# Patient Record
Sex: Female | Born: 1960 | Hispanic: No | Smoking: Never smoker
Health system: Southern US, Community
[De-identification: ages and names within clinical notes are randomized; demographics above are authoritative.]

## PROBLEM LIST (undated history)

## (undated) DIAGNOSIS — N912 Amenorrhea, unspecified: Secondary | ICD-10-CM

## (undated) DIAGNOSIS — I350 Nonrheumatic aortic (valve) stenosis: Secondary | ICD-10-CM

## (undated) DIAGNOSIS — E669 Obesity, unspecified: Secondary | ICD-10-CM

## (undated) DIAGNOSIS — A4902 Methicillin resistant Staphylococcus aureus infection, unspecified site: Secondary | ICD-10-CM

## (undated) DIAGNOSIS — F419 Anxiety disorder, unspecified: Secondary | ICD-10-CM

## (undated) DIAGNOSIS — M1990 Unspecified osteoarthritis, unspecified site: Secondary | ICD-10-CM

## (undated) HISTORY — DX: Amenorrhea, unspecified: N91.2

## (undated) HISTORY — DX: Unspecified osteoarthritis, unspecified site: M19.90

## (undated) HISTORY — DX: Methicillin resistant Staphylococcus aureus infection, unspecified site: A49.02

## (undated) HISTORY — PX: OTHER SURGICAL HISTORY: SHX169

## (undated) HISTORY — DX: Nonrheumatic aortic (valve) stenosis: I35.0

---

## 1988-08-21 HISTORY — PX: DILATION AND CURETTAGE OF UTERUS: SHX78

## 1992-08-21 HISTORY — PX: LAPAROSCOPIC CHOLECYSTECTOMY: SUR755

## 1998-12-22 ENCOUNTER — Emergency Department (HOSPITAL_COMMUNITY): Admission: EM | Admit: 1998-12-22 | Discharge: 1998-12-22 | Payer: Self-pay | Admitting: Emergency Medicine

## 1998-12-22 ENCOUNTER — Encounter: Payer: Self-pay | Admitting: Emergency Medicine

## 1999-01-03 ENCOUNTER — Emergency Department (HOSPITAL_COMMUNITY): Admission: EM | Admit: 1999-01-03 | Discharge: 1999-01-03 | Payer: Self-pay | Admitting: Internal Medicine

## 2002-11-11 ENCOUNTER — Encounter: Payer: Self-pay | Admitting: Internal Medicine

## 2002-11-11 ENCOUNTER — Ambulatory Visit (HOSPITAL_COMMUNITY): Admission: RE | Admit: 2002-11-11 | Discharge: 2002-11-11 | Payer: Self-pay | Admitting: Internal Medicine

## 2002-11-27 ENCOUNTER — Encounter: Admission: RE | Admit: 2002-11-27 | Discharge: 2002-11-27 | Payer: Self-pay | Admitting: Internal Medicine

## 2002-11-27 ENCOUNTER — Encounter: Payer: Self-pay | Admitting: Internal Medicine

## 2004-10-17 ENCOUNTER — Ambulatory Visit (HOSPITAL_COMMUNITY): Admission: RE | Admit: 2004-10-17 | Discharge: 2004-10-17 | Payer: Self-pay | Admitting: Internal Medicine

## 2006-03-28 ENCOUNTER — Inpatient Hospital Stay (HOSPITAL_COMMUNITY): Admission: EM | Admit: 2006-03-28 | Discharge: 2006-04-06 | Payer: Self-pay | Admitting: Emergency Medicine

## 2006-04-02 ENCOUNTER — Ambulatory Visit: Payer: Self-pay | Admitting: Infectious Diseases

## 2006-09-25 ENCOUNTER — Emergency Department (HOSPITAL_COMMUNITY): Admission: EM | Admit: 2006-09-25 | Discharge: 2006-09-26 | Payer: Self-pay | Admitting: Emergency Medicine

## 2006-11-15 ENCOUNTER — Encounter: Admission: RE | Admit: 2006-11-15 | Discharge: 2006-12-19 | Payer: Self-pay | Admitting: Orthopaedic Surgery

## 2006-12-11 ENCOUNTER — Encounter: Admission: RE | Admit: 2006-12-11 | Discharge: 2006-12-11 | Payer: Self-pay | Admitting: Orthopaedic Surgery

## 2007-02-08 ENCOUNTER — Emergency Department (HOSPITAL_COMMUNITY): Admission: EM | Admit: 2007-02-08 | Discharge: 2007-02-08 | Payer: Self-pay | Admitting: Emergency Medicine

## 2008-12-23 ENCOUNTER — Ambulatory Visit (HOSPITAL_COMMUNITY): Admission: RE | Admit: 2008-12-23 | Discharge: 2008-12-23 | Payer: Self-pay | Admitting: Internal Medicine

## 2009-03-17 ENCOUNTER — Emergency Department (HOSPITAL_COMMUNITY): Admission: EM | Admit: 2009-03-17 | Discharge: 2009-03-17 | Payer: Self-pay | Admitting: Emergency Medicine

## 2010-05-21 ENCOUNTER — Emergency Department (HOSPITAL_COMMUNITY): Admission: EM | Admit: 2010-05-21 | Discharge: 2010-05-21 | Payer: Self-pay | Admitting: Emergency Medicine

## 2010-11-03 LAB — WOUND CULTURE

## 2011-01-06 NOTE — Op Note (Signed)
Nicole Hines, Nicole Hines              ACCOUNT NO.:  [id number]   MEDICAL RECORD NO.:  [id number]          PATIENT TYPE:  INP   LOCATION:  6738                         FACILITY:  [facility]   PHYSICIAN:  Kathrin Penner, M.D.   DATE OF BIRTH:  04/29/1961   DATE OF PROCEDURE:  04/02/2006  DATE OF DISCHARGE:                                 OPERATIVE REPORT   PREOPERATIVE DIAGNOSIS:  Abdominal wall abscess.   POSTOPERATIVE DIAGNOSIS:  Abdominal wall abscess.   PROCEDURE:  Incision and drainage of abdominal wall abscess with cultures.   SURGEON:  Dr. Bubba Camp.   ASSISTANT:  OR nurse.   ANESTHESIA:  General.   SPECIMENS:  Cultures for aerobic and anaerobic to the lab.   ESTIMATED BLOOD LOSS:  Minimal.   COMPLICATIONS:  None.  The patient to the PACU in excellent condition.   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  She is  morbidly obese and presents with cellulitis of her anterior abdominal wall.  Ultrasounds show a developing abscess and on today's evaluation the patient  is noted to have a fluctuant area over the right paraumbilical region.  She  comes to the operating room now for incision and drainage, after risks and  potential benefits of surgery have been discussed, all questions answered  and consent obtained.   PROCEDURE:  Following the induction of satisfactory general endotracheal  anesthesia, the patient was positioned supinely and the abdomen is prepped  and draped to be included in a sterile operative field.  The area of  fluctuance was incised down upon and carried down first to about a 3 inch  incision, but upon entering the abscess it was noted to be multiple  loculated and extending up toward the costal margin and down toward the  pubis, and extending somewhat over the midline.  Multiple loculations were  broken up and the abscess cavity was drained and cultured.  It was then  thoroughly irrigated with multiple aliquots of normal saline.  All  pockets  were then packed with saline-  soaked Kerlix gauze, after hemostasis had been assured.  A sterile dressing  was placed over the wound.  The anesthetic reversed and the patient removed  from the operating room to the recovery room in stable condition.  She  tolerated the procedure well.      Kathrin Penner, M.D.  Electronically Signed     PB/MEDQ  D:  04/02/2006  T:  04/02/2006  Job:  [id number]   cc:   Kathrin Penner, M.D.

## 2011-01-06 NOTE — H&P (Signed)
NAMEGENENE, KILMAN NO.:  [id number]   MEDICAL RECORD NO.:  [id number]          PATIENT TYPE:  INP   LOCATION:  6738                         FACILITY:  [facility]   PHYSICIAN:  Precious Reel, MD       DATE OF BIRTH:  05/13/1961   DATE OF ADMISSION:  03/28/2006  DATE OF DISCHARGE:                                HISTORY & PHYSICAL   CHIEF COMPLAINT:  Abdominal abscess and cellulitis, nausea and vomiting.   HISTORY OF PRESENT ILLNESS:  This is a 50 year old female with reactive  airway disease who works outdoors as a Development worker, international aid who was in her usual state  of health until Monday.  She noted a nodule knot on the right lower abdomen.  It grew over the last two days with pain, redness, warmth, nausea, and  vomiting.  Patient has felt febrile.  No chest pain or shortness of breath.  She came to the ED for evaluation and treatment.  She was diagnosed with  cellulitis and an abscess.  She was given IV fluids, IV morphine, IV  vancomycin, and Zofran and I was called for inpatient admission.   PAST MEDICAL HISTORY:  1. Reactive airway disease.  2. Cholecystectomy.  3. Tubal ligation.  4. History of bronchitis.  5. History of tinea.  6. Osteoarthritis.  7. Anxiety.   ALLERGIES:  No known drug allergies.   MEDICATIONS:  1. Lexapro 10 daily.  2. Albuterol MDI two puffs b.i.d.  3. Vicodin p.r.n.  4. Singulair 10 daily.   FAMILY HISTORY:  Coronary artery disease, diabetes, hypertension.   SOCIAL HISTORY:  Divorced, two children.  Landscaper.  No tobacco.  No  alcohol.   REVIEW OF SYSTEMS:  She denies any chest pain, shortness of breath.  She  denies any respiratory issues at this current time.  Review of systems is  positive in the HPI.  She thinks she got this from her tool belt which might  be giving her some friction along her abdomen.   PHYSICAL EXAMINATION:  VITAL SIGNS:  Temperature 100.7, blood pressure  113/72, heart rate 118, respiratory rate 22, 97% room  air.  GENERAL:  Alert and oriented x3.  Face is flushed.  Oropharynx:  She is  missing teeth.  NECK:  No JVD.  PULMONARY:  Clear to auscultation bilaterally.  CARDIAC:  Regular.  ABDOMEN:  Soft except right lower quadrant with red warmth, swelling over  large area with one focal area of potential abscess, veins noted protruding  up to her abdominal wall.  EXTREMITIES:  No clubbing.  No cyanosis.  Trace edema.   CT scan shows cellulitis, fat necrosis anterior wall, subcutaneous  infection.  Two to three abscesses noted.  No acute intracranial process  seen.  Urinalysis negative except for small leukocytes, 100 protein, and 40  ketones.  Sodium 135, potassium 3.8, chloride 99, bicarbonate 26, BUN 10,  creatinine 0.9, glucose 122.  LFTs within normal limits.  White count 24.5,  hemoglobin 13.2, platelet count 322.   ASSESSMENT:  This is a 50 year old woman who is being admitted with  abdominal wall  cellulitis and abdominal wall abscess.  No fasciitis  identified at this point.   PLAN:  1. Admit.  2. IV antibiotics.  3. Surgical consult.  4. Follow up laboratories.  5. IV fluids.  6. Home medications.  7. Follow up on cultures.  8. Rule out MRSA.  9. Follow up on blood sugars and laboratories.  10.Follow up on leukocytosis.  11.Follow accordingly.      Precious Reel, MD  Electronically Signed     JMR/MEDQ  D:  03/29/2006  T:  03/29/2006  Job:  [id number]

## 2011-01-06 NOTE — Consult Note (Signed)
Nicole Hines, Nicole Hines              ACCOUNT NO.:  [id number]   MEDICAL RECORD NO.:  [id number]          PATIENT TYPE:  INP   LOCATION:  6738                         FACILITY:  [facility]   PHYSICIAN:  Marland Kitchen T. Hoxworth, M.D.DATE OF BIRTH:  January 23, 1961   DATE OF CONSULTATION:  03/28/2006  DATE OF DISCHARGE:                                   CONSULTATION   CHIEF COMPLAINT:  Pain, redness, swelling abdominal wall.   HISTORY OF PRESENT ILLNESS:  We were asked by Dr. Virgina Jock to evaluate Ms.  Hines.  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  Approximately 5 days ago she noted a small area of redness and tenderness on  the right abdominal wall, but it did not appear severe.  About 2 days ago,  however, she began feeling generally ill, with malaise, aches, fever or  nausea and vomiting.  At that point, she noted enlargement of the area of  the redness.  Her symptoms progressed and she presented for evaluation  today, and was admitted by Dr. Virgina Jock.  She is not aware of any definite  bites or injury to the area.  She is not diabetic.  She has no history of  any similar problems.   PAST MEDICAL HISTORY:  Surgery includes cholecystectomy, tubal ligation.  Medically, she is followed for morbid obesity, asthma and anxiety.  Medications are Lexapro 10 a day, albuterol MDI b.i.d., Singulair daily.   ALLERGIES:  NO KNOWN ALLERGIES.   SOCIAL HISTORY:  Does not smoke cigarettes or drink alcohol.  Works as a  Development worker, international aid.  Divorced.   FAMILY HISTORY:  Noncontributory.   REVIEW OF SYSTEMS:  Positive for malaise, fever.  RESPIRATORY:  No recent  shortness of breath, cough, wheezing.  CARDIAC:  No chest pain,  palpitations.  ABDOMEN/GI:  No generalized abdominal pain.  Positive for  nausea and vomiting as above.   PHYSICAL EXAMINATION:  Temperature is 100.7, blood pressure 113/72, heart  rate 18, respirations 22, room air O2 sats 97%.  GENERAL:  Obese white female in no acute  distress.  SKIN:  Warm and dry.  See abdomen below.  HEENT:  Sclerae nonicteric.  Oropharynx clear.  LUNGS:  Clear without wheezing or increased work of breathing.  CARDIAC:  Regular tachycardia.  No murmurs.  No edema.  ABDOMEN:  On the right mid abdominal wall is an irregular area of marked  cellulitis, measuring approximately 12-15 x 6-8 cm.  In the lateral aspect  of this there is a small raised papule or puncture.  There is marked  tenderness over this area.  The remainder of the abdomen is nontender.  There is no purulent drainage.  No apparent fluctuance.  No blistering or  skin necrosis.  EXTREMITIES:  No joint swelling or deformity.  NEUROLOGIC:  Alert, oriented.  Motor and sensory exams grossly normal.   LABORATORY:  White count elevated at 23.7, hemoglobin 12.  UA 3-6 white  cells.  CMET abnormal for glucose of 122, and albumin of 3.4   I reviewed her CT scan of the abdomen which shows an area  of cellulitis in  the subcutaneous tissue of the right mid abdominal wall.  There are a couple  of more discrete dense areas in this, but no definite abscess seen.   ASSESSMENT/PLAN:  Severe cellulitis of abdominal wall, questioned insect  bite or puncture wound.  I do not see, at this point, anything that requires  immediate drainage or debridement.  It is a significant soft tissue  infection.  I agree with broad-spectrum antibiotics with vancomycin and  Zosyn.  We will follow closely with you and observe for improvement.  May  well come to needing drainage or debridement.      Darene Lamer. Hoxworth, M.D.  Electronically Signed     BTH/MEDQ  D:  03/28/2006  T:  03/28/2006  Job:  [id number]

## 2011-01-06 NOTE — Discharge Summary (Signed)
NAMENICKCOLE, BRALLEY NO.:  [id number]   MEDICAL RECORD NO.:  [id number]          PATIENT TYPE:  INP   LOCATION:  4827                         FACILITY:  [facility]   PHYSICIAN:  Precious Reel, MD       DATE OF BIRTH:  Mar 07, 1961   DATE OF ADMISSION:  03/28/2006  DATE OF DISCHARGE:  04/06/2006                                 DISCHARGE SUMMARY   PRIMARY CARE Jakaylee Sasaki:  Precious Reel, M.D.   INFECTIOUS DISEASE:  Arelia Longest. Quentin Cornwall, M.D.   SURGEON:  1. Darene Lamer. Hoxworth, M.D.  2. Kathrin Penner, M.D.   DISCHARGE DIAGNOSES:  1. Significant and complex methicillin-resistant Staphylococcus aureus,      abdominal wall abscess and cellulitis.  2. __________.  3. Leukocytosis.  4. Hyperglycemia without diabetes.  5. Increased blood pressure due to pain.  6. History of reactive airway disease.  7. History of cholecystectomy.  8. History of tubal ligation.  9. History of bronchitis.  10.History of tinea.  11.Anxiety.   MEDICATION LIST:  1. Lexapro 10 daily.  2. Albuterol M.D.I. two puffs b.i.d.  3. Vicodin.  4. Singulair.  5. __________ 10 daily.  6. Benadryl 25 mg p.o. q.6h. p.r.n. rash.  7. Keep wound clean.  Normal saline wet dressing to wound.  Cover with dry      dressing and an ABD pad.  8. Home Health.  9. Doxycycline 100 mg p.o. b.i.d. for 10 more days.  10.Diflucan 200 mg p.o. daily, #2 for yeasty rash.   HISTORY OF PRESENT ILLNESS:  Briefly, Ms. 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  She  was in her usual state of health until Monday.  She noted a nodule/knot on  her right lower abdomen.  It grew rapidly and significantly in 2 days with  pain, redness, warmth, nausea and vomiting.  There was no chest pain or  shortness of breath.  There was fever.  She came to the ED for evaluation  and treatment.  She was diagnosis with cellulitis and an abscess.  She was  given IV fluids, IV  morphine, IV vancomycin and Zofran.  I was called for  inpatient admission.   HOSPITAL COURSE:  Due to the impressive nature and the extent of the  abdominal wall infection, Dr. Excell Seltzer was consulted.  Her white count was  23.7.  At the initial surgical visit, the surgeons felt there was no surgery  indicated at that point.  Over the course of the several days and very  slowly healing wound, and appropriate IV antibiotic coverage, Ms. Seiden  defervesced, and her white blood cell count came down into the mid-teens.  From there, she failed to progress any further.  CT scan on March 28, 2006  showed findings compatible with inflammatory infectious process involving  the right anterolateral abdominal wall soft tissues, possibly 2 or 3 small  abscesses and cellulitis.  There was a negative CT pelvis.  There was a  repeat CT of the abdomen and pelvis on March 31, 2006 which  showed an  increase in inflammatory changes within the subcutaneous fat in the right  lower interior abdominal wall.  Development of multiple small irregular  fluid collections which may or may not communicate.  This is consistent with  a phlegmon rather than a well-formed abscess.  Because of this, infectious  disease was brought into the process to make sure that antibiotic coverage  was adequate, and surgery made arrangements to take her to the operating  room.  On April 02, 2006, Ms. Kallio was operated on by Dr. Bubba Camp for an  appropriate I&D.  Cultures eventually came back with MRSA.  Dr. Quentin Cornwall  agreed with a switch over to doxycycline 100 mg p.o. b.i.d. for 7-10 more  days, wanted her to improve her household hygiene, and hygiene while she is  at work.  Dr. Bubba Camp recommended appropriate dressing changes, and on April 05, 2006, Ms. Cipriani was ready to be discharged home, but unfortunately she  did develop a rash that was probably more related to vancomycin and probably  not a true allergy.  She was watched  overnight, and by the following  morning, the rash was some better on Benadryl, and it was decided to let her  go home in stable condition.   In the hospital, she had other issues including hyperglycemia, but her A1C  was fine, and when you followed her blood sugars out, they do not stay  elevated.  She is currently not a diabetic, and this will be followed up as  an outpatient.  She also had problems with elevated blood pressure, and this  will again be followed up as an outpatient.  She did have headaches, and she  ended up getting a non-contrast CT scan which was negative to rule out any  focal abscesses or septic emboli in her brain.   AFTERCARE FOLLOWUP INSTRUCTIONS:  1. She is to followup with me in the week of April 16, 2006.  She will      need a CBC at that visit.  2. She will followup with Dr. Bubba Camp in 2-3 weeks.  3. She will have the wound dressed by home health.  4. She will call with any questions or concerns.      Precious Reel, MD  Electronically Signed     JMR/MEDQ  D:  05/17/2006  T:  05/17/2006  Job:  [id number]

## 2011-06-07 LAB — DIFFERENTIAL
Basophils Absolute: 0.1
Basophils Relative: 0
Eosinophils Relative: 1
Monocytes Absolute: 1.3 — ABNORMAL HIGH
Neutro Abs: 14 — ABNORMAL HIGH

## 2011-06-07 LAB — URINALYSIS, ROUTINE W REFLEX MICROSCOPIC
Ketones, ur: NEGATIVE
Nitrite: NEGATIVE
Specific Gravity, Urine: 1.03
Urobilinogen, UA: 1

## 2011-06-07 LAB — CBC
HCT: 39.3
Hemoglobin: 13.4
MCV: 87.2
Platelets: 318
RBC: 4.51
WBC: 17.8 — ABNORMAL HIGH

## 2011-06-07 LAB — COMPREHENSIVE METABOLIC PANEL
Albumin: 3.8
Alkaline Phosphatase: 98
BUN: 13
CO2: 23
Chloride: 101
GFR calc non Af Amer: >60
Potassium: 4.9
Total Bilirubin: 1.7 — ABNORMAL HIGH

## 2011-06-20 ENCOUNTER — Emergency Department (HOSPITAL_COMMUNITY)
Admission: EM | Admit: 2011-06-20 | Discharge: 2011-06-20 | Disposition: A | Discharge status: Home / Self Care | Payer: Self-pay | Attending: Emergency Medicine | Admitting: Emergency Medicine

## 2011-06-20 DIAGNOSIS — Z8614 Personal history of Methicillin resistant Staphylococcus aureus infection: Secondary | ICD-10-CM | POA: Insufficient documentation

## 2011-06-20 DIAGNOSIS — L02219 Cutaneous abscess of trunk, unspecified: Secondary | ICD-10-CM | POA: Insufficient documentation

## 2011-06-20 DIAGNOSIS — M129 Arthropathy, unspecified: Secondary | ICD-10-CM | POA: Insufficient documentation

## 2011-06-20 DIAGNOSIS — Z79899 Other long term (current) drug therapy: Secondary | ICD-10-CM | POA: Insufficient documentation

## 2011-06-22 ENCOUNTER — Emergency Department (HOSPITAL_COMMUNITY)
Admission: EM | Admit: 2011-06-22 | Discharge: 2011-06-22 | Disposition: A | Discharge status: Home / Self Care | Payer: Self-pay | Attending: Emergency Medicine | Admitting: Emergency Medicine

## 2011-06-22 DIAGNOSIS — Z09 Encounter for follow-up examination after completed treatment for conditions other than malignant neoplasm: Secondary | ICD-10-CM | POA: Insufficient documentation

## 2011-06-22 DIAGNOSIS — L02219 Cutaneous abscess of trunk, unspecified: Secondary | ICD-10-CM | POA: Insufficient documentation

## 2011-07-31 ENCOUNTER — Emergency Department (HOSPITAL_COMMUNITY)
Admission: EM | Admit: 2011-07-31 | Discharge: 2011-08-01 | Disposition: A | Discharge status: Home / Self Care | Payer: Self-pay | Attending: Emergency Medicine | Admitting: Emergency Medicine

## 2011-07-31 ENCOUNTER — Encounter: Payer: Self-pay | Admitting: *Deleted

## 2011-07-31 DIAGNOSIS — L02519 Cutaneous abscess of unspecified hand: Secondary | ICD-10-CM | POA: Insufficient documentation

## 2011-07-31 DIAGNOSIS — M7989 Other specified soft tissue disorders: Secondary | ICD-10-CM | POA: Insufficient documentation

## 2011-07-31 DIAGNOSIS — Y93H2 Activity, gardening and landscaping: Secondary | ICD-10-CM | POA: Insufficient documentation

## 2011-07-31 DIAGNOSIS — IMO0002 Reserved for concepts with insufficient information to code with codable children: Secondary | ICD-10-CM | POA: Insufficient documentation

## 2011-07-31 DIAGNOSIS — L03012 Cellulitis of left finger: Secondary | ICD-10-CM

## 2011-07-31 DIAGNOSIS — M79609 Pain in unspecified limb: Secondary | ICD-10-CM | POA: Insufficient documentation

## 2011-07-31 DIAGNOSIS — L03019 Cellulitis of unspecified finger: Secondary | ICD-10-CM | POA: Insufficient documentation

## 2011-07-31 DIAGNOSIS — J45909 Unspecified asthma, uncomplicated: Secondary | ICD-10-CM | POA: Insufficient documentation

## 2011-07-31 HISTORY — DX: Anxiety disorder, unspecified: F41.9

## 2011-07-31 NOTE — ED Notes (Signed)
The pt was stuck in the lt index finger with a rose thorn last Thursday..  Redness and swelling with pain to the lt distal phalanx

## 2011-08-01 ENCOUNTER — Emergency Department (HOSPITAL_COMMUNITY): Payer: Self-pay

## 2011-08-01 MED ORDER — CEPHALEXIN 500 MG PO CAPS: 500.0000 mg | ORAL_CAPSULE | Freq: Four times a day (QID) | ORAL | Status: AC

## 2011-08-01 MED ORDER — HYDROCODONE-ACETAMINOPHEN 5-500 MG PO TABS: 1.0000 | ORAL_TABLET | Freq: Four times a day (QID) | ORAL | Status: AC | PRN

## 2011-08-01 MED ORDER — IBUPROFEN 800 MG PO TABS
800.0000 mg | ORAL_TABLET | Freq: Once | ORAL | Status: AC
  Administered 2011-08-01: 800 mg via ORAL
  Filled 2011-08-01: qty 1

## 2011-08-01 NOTE — ED Notes (Signed)
Pt denies any questions, verbalizes understanding no driving to pain meds and completion of antibiotics.

## 2011-08-01 NOTE — ED Provider Notes (Signed)
History     CSN: [id number] Arrival date & time: 07/31/2011 10:27 PM   First MD Initiated Contact with Patient 08/01/11 0123      Chief Complaint  Patient presents with  . Wound Infection     HPI: Patient reports she stuck her left index finger with a thorn on a rose bush while pruning rose bushes on Thursday. Since that time she has had increasing redness pain and swelling to the fingertip.   Past Medical History  Diagnosis Date  . Asthma   . Anxiety     History reviewed. No pertinent past surgical history.  History reviewed. No pertinent family history.  History  Substance Use Topics  . Smoking status: Never Smoker   . Smokeless tobacco: Not on file  . Alcohol Use: No    OB History    Grav Para Term Preterm Abortions TAB SAB Ect Mult Living                  Review of Systems  Constitutional: Negative.   HENT: Negative.   Eyes: Negative.   Respiratory: Negative.   Cardiovascular: Negative.   Gastrointestinal: Negative.   Genitourinary: Negative.   Musculoskeletal: Negative.   Skin: Negative.   Neurological: Negative.   Hematological: Negative.   Psychiatric/Behavioral: Negative.     Allergies  Review of patient's allergies indicates no known allergies.  Home Medications   Current Outpatient Rx  Name Route Sig Dispense Refill  . ALPRAZOLAM 0.5 MG PO TABS Oral Take 0.5 mg by mouth at bedtime as needed. For anxiety     . CITALOPRAM HYDROBROMIDE 40 MG PO TABS Oral Take 40 mg by mouth daily.      Marland Kitchen HYDROCODONE-ACETAMINOPHEN 5-500 MG PO TABS Oral Take 1 tablet by mouth every 6 (six) hours as needed. For pain       BP 155/78  Pulse 73  Resp 20  SpO2 97%  LMP 07/17/2011  Physical Exam  Constitutional: She is oriented to person, place, and time. She appears well-developed and well-nourished.  HENT:  Head: Normocephalic and atraumatic.  Eyes: Conjunctivae are normal.  Cardiovascular: Normal rate.   Pulmonary/Chest: Effort normal.    Musculoskeletal: Normal range of motion.  Neurological: She is alert and oriented to person, place, and time.  Skin: Skin is warm and dry. No erythema.  Psychiatric: She has a normal mood and affect.    ED Course  Procedures Findings findings and impression discussed with patient. Will plan for discharge home on antibiotic and encourage frequent warm soaks. Patient to return in 48 hours for recheck at an [facility] urgent care. Patient is agreeable with plan to  Labs Reviewed - No data to display No results found.   No diagnosis found.    MDM  Localized cellulitis s/p puncture wound.        Jeryl Columbia, NP 08/01/11 [phone]

## 2011-08-02 NOTE — ED Provider Notes (Signed)
Medical screening examination/treatment/procedure(s) were performed by non-physician practitioner and as supervising physician I was immediately available for consultation/collaboration.   Johnna Acosta, MD 08/02/11 [phone]

## 2012-03-04 ENCOUNTER — Emergency Department (HOSPITAL_COMMUNITY)
Admission: EM | Admit: 2012-03-04 | Discharge: 2012-03-05 | Disposition: A | Discharge status: Home / Self Care | Payer: Self-pay | Attending: Emergency Medicine | Admitting: Emergency Medicine

## 2012-03-04 ENCOUNTER — Encounter (HOSPITAL_COMMUNITY): Payer: Self-pay | Admitting: *Deleted

## 2012-03-04 DIAGNOSIS — R209 Unspecified disturbances of skin sensation: Secondary | ICD-10-CM | POA: Insufficient documentation

## 2012-03-04 DIAGNOSIS — M25569 Pain in unspecified knee: Secondary | ICD-10-CM | POA: Insufficient documentation

## 2012-03-04 DIAGNOSIS — G8929 Other chronic pain: Secondary | ICD-10-CM | POA: Insufficient documentation

## 2012-03-04 DIAGNOSIS — J45909 Unspecified asthma, uncomplicated: Secondary | ICD-10-CM | POA: Insufficient documentation

## 2012-03-04 DIAGNOSIS — M549 Dorsalgia, unspecified: Secondary | ICD-10-CM

## 2012-03-04 DIAGNOSIS — F411 Generalized anxiety disorder: Secondary | ICD-10-CM | POA: Insufficient documentation

## 2012-03-04 DIAGNOSIS — M545 Low back pain, unspecified: Secondary | ICD-10-CM | POA: Insufficient documentation

## 2012-03-04 DIAGNOSIS — Z79899 Other long term (current) drug therapy: Secondary | ICD-10-CM | POA: Insufficient documentation

## 2012-03-04 DIAGNOSIS — M25579 Pain in unspecified ankle and joints of unspecified foot: Secondary | ICD-10-CM | POA: Insufficient documentation

## 2012-03-04 NOTE — ED Notes (Signed)
Patient with bilateral leg pain.  Patient states that she has arthritis to her right knee and lately her knees down have been hurting.  She feels tingling in her legs.

## 2012-03-05 ENCOUNTER — Emergency Department (HOSPITAL_COMMUNITY): Payer: Self-pay

## 2012-03-05 MED ORDER — OXYCODONE-ACETAMINOPHEN 5-325 MG PO TABS
1.0000 | ORAL_TABLET | Freq: Once | ORAL | Status: AC
  Administered 2012-03-05: 1 via ORAL
  Filled 2012-03-05: qty 1

## 2012-03-05 MED ORDER — PERCOCET 5-325 MG PO TABS: 1.0000 | ORAL_TABLET | ORAL | Status: AC | PRN

## 2012-03-05 NOTE — ED Provider Notes (Signed)
History     CSN: [id number]  Arrival date & time 03/04/12  2209   First MD Initiated Contact with Patient 03/05/12 0050      Chief Complaint  Patient presents with  . Leg Pain    (Consider location/radiation/quality/duration/timing/severity/associated sxs/prior treatment) HPI Comments: Patient is a 51 year-old female with a history of chronic back pain and chronic knee pain who presents with worsening pain in her bilateral knees radiating to her ankles and lower back pain for the past 2 weeks. She describes the pain as throbbing and aching. She sometimes has a shooting pain down the lateral aspects of her lower legs that extends from her knees to her ankles. The pain is constant and an 8 out of 10 on the pain scale. The pain is worsened by walking and weightbearing.  Pt walks with a cane for support.  She notes numbness in the tips of all of her toes and and the plantar surfaces of her MTPs bilaterally. She has had two episodes in which she has fallen when her right knee "gave out." She denies loss of consciousness and other fall related injuries.  Denies fevers, bowel or bladder incontinence or retention, weakness or numbness of the extremities.  Pt is seen by Dr Virgina Jock (PCP).  Has seen Dr Jean Rosenthal (ortho) in the past for her back and knees but has not followed up recently due to lack of insurance.    Patient is a 51 y.o. female presenting with leg pain. The history is provided by the patient.  Leg Pain  Associated symptoms include numbness.    Past Medical History  Diagnosis Date  . Asthma   . Anxiety     History reviewed. No pertinent past surgical history.  History reviewed. No pertinent family history.  History  Substance Use Topics  . Smoking status: Never Smoker   . Smokeless tobacco: Not on file  . Alcohol Use: No    OB History    Grav Para Term Preterm Abortions TAB SAB Ect Mult Living                  Review of Systems  Constitutional: Negative for  fever and chills.  Musculoskeletal: Positive for back pain and gait problem.  Neurological: Positive for numbness. Negative for syncope and weakness.    Allergies  Review of patient's allergies indicates no known allergies.  Home Medications   Current Outpatient Rx  Name Route Sig Dispense Refill  . ALPRAZOLAM 0.5 MG PO TABS Oral Take 0.5-1 mg by mouth at bedtime as needed. For anxiety    . CALCIUM 600+D3 PO Oral Take 1 tablet by mouth 2 (two) times daily.    Marland Kitchen CITALOPRAM HYDROBROMIDE 40 MG PO TABS Oral Take 40 mg by mouth daily.      . OMEGA-3 FATTY ACIDS 1000 MG PO CAPS Oral Take 1 g by mouth 2 (two) times daily.    Marland Kitchen GLUCOSAMINE HCL-MSM PO Oral Take 1 tablet by mouth 2 (two) times daily.    Marland Kitchen HYDROCODONE-ACETAMINOPHEN 5-325 MG PO TABS Oral Take 1 tablet by mouth every 6 (six) hours as needed. For pain    . ADULT MULTIVITAMIN W/MINERALS CH Oral Take 1 tablet by mouth daily.    Marland Kitchen VITAMIN B-12 1000 MCG PO TABS Oral Take 1,000 mcg by mouth daily.      BP 135/75  Pulse 74  Temp 97.7 F (36.5 C) (Oral)  Resp 16  SpO2 98%  Physical Exam  Nursing note  and vitals reviewed. Constitutional: She is oriented to person, place, and time. She appears well-developed and well-nourished. No distress.        Morbidly obese  HENT:  Head: Normocephalic and atraumatic.  Neck: Neck supple.  Pulmonary/Chest: Effort normal.  Musculoskeletal:       Right hip: Normal.       Left hip: Normal.       Right knee: She exhibits decreased range of motion. She exhibits no swelling, no effusion and no deformity.       Left knee: Normal.       Right ankle: Normal.       Left ankle: Normal.       Cervical back: Normal.       Thoracic back: Normal.       Lumbar back: She exhibits tenderness and bony tenderness.       Feet:       Pt with decreased ROM of right knee (chronic, secondary to pain), diffuse tenderness.  No erythema, edema, warmth.    Lower extremities: strength 5/5,  distal pulses  intact.  Straight leg raise is negative.    Neurological: She is alert and oriented to person, place, and time.  Skin: She is not diaphoretic.    ED Course  Procedures (including critical care time)  Labs Reviewed - No data to display Dg Lumbar Spine Complete  03/05/2012  *RADIOLOGY REPORT*  Clinical Data: Back pain after fall.  LUMBAR SPINE - COMPLETE 4+ VIEW  Comparison: MRI lumbar spine 12/11/2006.  Findings: Five lumbar type vertebra with atrophic 12th ribs. Normal alignment of the lumbar vertebra and facet joints.  Mild endplate hypertrophic changes.  Intervertebral disc space heights are mostly preserved.  No vertebral compression deformities.  No focal bone lesion or bone destruction.  Bone cortex and trabecular architecture appear intact.  Degenerative changes in the facet joints.  Surgical clips in the abdomen.  IMPRESSION: Degenerative changes in the lumbar spine.  No displaced fractures identified.  Original Report Authenticated By: Neale Burly, M.D.   Dg Knee Complete 4 Views Left  03/05/2012  *RADIOLOGY REPORT*  Clinical Data: Pain after fall.  LEFT KNEE - COMPLETE 4+ VIEW  Comparison: None.  Findings: Degenerative changes in the left knee with medial compartment narrowing and tricompartmental hypertrophic changes. Small left knee effusion.  No evidence of acute fracture or subluxation.  No focal bone lesion or bone destruction.  Bone cortex and trabecular architecture appear intact.  Soft tissue calcifications consistent with phleboliths.  IMPRESSION: Degenerative changes in the knee.  No acute bony abnormalities.  Original Report Authenticated By: Neale Burly, M.D.   Dg Knee Complete 4 Views Right  03/05/2012  *RADIOLOGY REPORT*  Clinical Data: Pain after fall.  RIGHT KNEE - COMPLETE 4+ VIEW  Comparison: None.  Findings: Degenerative changes in the right knee with tricompartmental narrowing and hypertrophic changes.  No significant effusion.  No evidence of acute  fracture or subluxation.  No focal bone lesion or bone destruction.  Bone cortex and trabecular architecture appear intact.  No abnormal periosteal reaction.  IMPRESSION: Degenerative changes in the right knee.  No acute bony abnormalities identified.  Original Report Authenticated By: Neale Burly, M.D.     1. Chronic back pain   2. Chronic knee pain       MDM  Pt is morbidly obese with chronic back and bilateral knee pain, worsened over past few weeks.  I do not believe this is radicular pain given history  and exam.  No significant neurological deficits on exam.  Gait is abnormal as patient appears to be in pain and walks with limp and cane.  Appears to be chronic worsening of knee and back pain, separately.  Pt given percocet in ED which she stated helped.   Pt d/c home with percocet, PCP and ortho follow up.  Return precautions given.  Patient verbalizes understanding and agrees with plan.         [place], [place] 03/05/12 [phone]

## 2012-03-07 NOTE — ED Provider Notes (Signed)
Medical screening examination/treatment/procedure(s) were performed by non-physician practitioner and as supervising physician I was immediately available for consultation/collaboration.   Mervin Kung, MD 03/07/12 2158

## 2013-11-20 ENCOUNTER — Emergency Department (HOSPITAL_COMMUNITY)
Admission: EM | Admit: 2013-11-20 | Discharge: 2013-11-20 | Disposition: A | Discharge status: Home / Self Care | Payer: BC Managed Care – PPO | Attending: Emergency Medicine | Admitting: Emergency Medicine

## 2013-11-20 ENCOUNTER — Encounter (HOSPITAL_COMMUNITY): Payer: Self-pay | Admitting: Emergency Medicine

## 2013-11-20 DIAGNOSIS — K529 Noninfective gastroenteritis and colitis, unspecified: Secondary | ICD-10-CM

## 2013-11-20 DIAGNOSIS — Z79899 Other long term (current) drug therapy: Secondary | ICD-10-CM | POA: Insufficient documentation

## 2013-11-20 DIAGNOSIS — K5289 Other specified noninfective gastroenteritis and colitis: Secondary | ICD-10-CM | POA: Insufficient documentation

## 2013-11-20 DIAGNOSIS — J45901 Unspecified asthma with (acute) exacerbation: Secondary | ICD-10-CM | POA: Insufficient documentation

## 2013-11-20 DIAGNOSIS — F411 Generalized anxiety disorder: Secondary | ICD-10-CM | POA: Insufficient documentation

## 2013-11-20 LAB — URINALYSIS, ROUTINE W REFLEX MICROSCOPIC
Glucose, UA: NEGATIVE mg/dL
Hgb urine dipstick: NEGATIVE
Ketones, ur: NEGATIVE mg/dL
LEUKOCYTES UA: NEGATIVE
NITRITE: NEGATIVE
PH: 5.5 (ref 5.0–8.0)
Protein, ur: 30 mg/dL — AB
Specific Gravity, Urine: 1.029 (ref 1.005–1.030)
Urobilinogen, UA: 0.2 mg/dL (ref 0.0–1.0)

## 2013-11-20 LAB — CBC WITH DIFFERENTIAL/PLATELET
BASOS ABS: 0 10*3/uL (ref 0.0–0.1)
Basophils Relative: 0 % (ref 0–1)
Eosinophils Absolute: 0.2 10*3/uL (ref 0.0–0.7)
Eosinophils Relative: 2 % (ref 0–5)
HEMATOCRIT: 38.5 % (ref 36.0–46.0)
Hemoglobin: 12.8 g/dL (ref 12.0–15.0)
LYMPHS PCT: 34 % (ref 12–46)
Lymphs Abs: 2.8 10*3/uL (ref 0.7–4.0)
MCH: 30.6 pg (ref 26.0–34.0)
MCHC: 33.2 g/dL (ref 30.0–36.0)
MCV: 92.1 fL (ref 78.0–100.0)
MONO ABS: 0.5 10*3/uL (ref 0.1–1.0)
Monocytes Relative: 6 % (ref 3–12)
Neutro Abs: 4.7 10*3/uL (ref 1.7–7.7)
Neutrophils Relative %: 58 % (ref 43–77)
PLATELETS: 282 10*3/uL (ref 150–400)
RBC: 4.18 MIL/uL (ref 3.87–5.11)
RDW: 13 % (ref 11.5–15.5)
WBC: 8.2 10*3/uL (ref 4.0–10.5)

## 2013-11-20 LAB — COMPREHENSIVE METABOLIC PANEL
ALBUMIN: 3.8 g/dL (ref 3.5–5.2)
ALT: 26 U/L (ref 0–35)
AST: 24 U/L (ref 0–37)
Alkaline Phosphatase: 88 U/L (ref 39–117)
BUN: 13 mg/dL (ref 6–23)
CALCIUM: 9.9 mg/dL (ref 8.4–10.5)
CO2: 29 meq/L (ref 19–32)
CREATININE: 0.63 mg/dL (ref 0.50–1.10)
Chloride: 98 mEq/L (ref 96–112)
GFR calc Af Amer: >90 mL/min (ref >90)
GFR calc non Af Amer: >90 mL/min (ref >90)
Glucose, Bld: 110 mg/dL — ABNORMAL HIGH (ref 70–99)
Potassium: 3.6 mEq/L — ABNORMAL LOW (ref 3.7–5.3)
SODIUM: 138 meq/L (ref 137–147)
TOTAL PROTEIN: 7.7 g/dL (ref 6.0–8.3)
Total Bilirubin: 0.5 mg/dL (ref 0.3–1.2)

## 2013-11-20 LAB — URINE MICROSCOPIC-ADD ON

## 2013-11-20 LAB — LIPASE, BLOOD: LIPASE: 13 U/L (ref 11–59)

## 2013-11-20 MED ORDER — ONDANSETRON HCL 4 MG PO TABS: 4.0000 mg | ORAL_TABLET | Freq: Four times a day (QID) | ORAL | Status: DC

## 2013-11-20 MED ORDER — SODIUM CHLORIDE 0.9 % IV SOLN
1000.0000 mL | Freq: Once | INTRAVENOUS | Status: AC
  Administered 2013-11-20: 1000 mL via INTRAVENOUS

## 2013-11-20 MED ORDER — SODIUM CHLORIDE 0.9 % IV SOLN: 1000.0000 mL | INTRAVENOUS | Status: DC

## 2013-11-20 MED ORDER — ONDANSETRON HCL 4 MG/2ML IJ SOLN
4.0000 mg | Freq: Once | INTRAMUSCULAR | Status: AC
  Administered 2013-11-20: 4 mg via INTRAVENOUS
  Filled 2013-11-20: qty 2

## 2013-11-20 NOTE — ED Provider Notes (Signed)
CSN: [id number]     Arrival date & time 11/20/13  1417 History   First MD Initiated Contact with Patient 11/20/13 1550     Chief Complaint  Patient presents with  . Nausea  . Emesis  . Diarrhea   HPI Pt started to feel sick on Sunday.  She has been exposed to family members with PNA and the flu so she really has not been taking care of herself.  .  She has been vomiting, once today and twice yesterday.  She does not have any diarrhea any more but had episodes yesterday.  She has not been able to eat or drink much and feels dehydrated.  General malaise.  No abdominal pain.  No chest pain.  No syncope.  She tried to call her doctor but was told to come to the ED  Past Medical History  Diagnosis Date  . Asthma   . Anxiety    History reviewed. No pertinent past surgical history. No family history on file. History  Substance Use Topics  . Smoking status: Never Smoker   . Smokeless tobacco: Not on file  . Alcohol Use: No   OB History   Grav Para Term Preterm Abortions TAB SAB Ect Mult Living                 Review of Systems  Constitutional: Positive for fatigue. Negative for fever.  Respiratory: Positive for cough and wheezing.   Cardiovascular: Negative for chest pain.  Gastrointestinal: Negative for abdominal pain.  Genitourinary: Negative for dysuria.  Skin: Negative for rash.  All other systems reviewed and are negative.      Allergies  Review of patient's allergies indicates no known allergies.  Home Medications   Current Outpatient Rx  Name  Route  Sig  Dispense  Refill  . ALPRAZolam (XANAX) 0.5 MG tablet   Oral   Take 0.5-1 mg by mouth at bedtime as needed. For anxiety         . Calcium Carbonate-Vitamin D (CALCIUM 600+D3 PO)   Oral   Take 1 tablet by mouth 2 (two) times daily.         . citalopram (CELEXA) 40 MG tablet   Oral   Take 40 mg by mouth daily.           . fish oil-omega-3 fatty acids 1000 MG capsule   Oral   Take 1 g by mouth 2 (two)  times daily.         Marland Kitchen GLUCOSAMINE HCL-MSM PO   Oral   Take 1 tablet by mouth 2 (two) times daily.         Marland Kitchen HYDROcodone-acetaminophen (NORCO/VICODIN) 5-325 MG per tablet   Oral   Take 1 tablet by mouth every 6 (six) hours as needed. For pain         . Multiple Vitamin (MULTIVITAMIN WITH MINERALS) TABS   Oral   Take 1 tablet by mouth daily.         . vitamin B-12 (CYANOCOBALAMIN) 1000 MCG tablet   Oral   Take 1,000 mcg by mouth daily.          BP 130/79  Pulse 70  Temp(Src) 98.7 F (37.1 C) (Oral)  Resp 20  SpO2 97% Physical Exam  Nursing note and vitals reviewed. Constitutional: She appears well-developed and well-nourished. No distress.  HENT:  Head: Normocephalic and atraumatic.  Right Ear: External ear normal.  Left Ear: External ear normal.  Eyes: Conjunctivae are normal. Right  eye exhibits no discharge. Left eye exhibits no discharge. No scleral icterus.  Neck: Neck supple. No tracheal deviation present.  Cardiovascular: Normal rate, regular rhythm and intact distal pulses.   Pulmonary/Chest: Effort normal and breath sounds normal. No stridor. No respiratory distress. She has no wheezes. She has no rales.  Abdominal: Soft. Bowel sounds are normal. She exhibits no distension. There is no tenderness. There is no rebound and no guarding.  Musculoskeletal: She exhibits no edema and no tenderness.  Neurological: She is alert. She has normal strength. No cranial nerve deficit (no facial droop, extraocular movements intact, no slurred speech) or sensory deficit. She exhibits normal muscle tone. She displays no seizure activity. Coordination normal.  Skin: Skin is warm and dry. No rash noted.  Psychiatric: She has a normal mood and affect.    ED Course  Procedures (including critical care time) Labs Review Labs Reviewed  COMPREHENSIVE METABOLIC PANEL - Abnormal; Notable for the following:    Potassium 3.6 (*)    Glucose, Bld 110 (*)    All other components  within normal limits  URINALYSIS, ROUTINE W REFLEX MICROSCOPIC - Abnormal; Notable for the following:    Color, Urine AMBER (*)    APPearance CLOUDY (*)    Bilirubin Urine SMALL (*)    Protein, ur 30 (*)    All other components within normal limits  URINE MICROSCOPIC-ADD ON - Abnormal; Notable for the following:    Squamous Epithelial / LPF FEW (*)    All other components within normal limits  LIPASE, BLOOD  CBC WITH DIFFERENTIAL   Medications  0.9 %  sodium chloride infusion (1,000 mLs Intravenous New Bag/Given 11/20/13 1612)    Followed by  0.9 %  sodium chloride infusion (not administered)  ondansetron (ZOFRAN) injection 4 mg (4 mg Intravenous Given 11/20/13 1612)     MDM   Final diagnoses:  Gastroenteritis    Likely viral illness.  Improved with IV hydration.  Will dc home with zofran rx.  At this time there does not appear to be any evidence of an acute emergency medical condition and the patient appears stable for discharge with appropriate outpatient follow up.     Kathalene Frames, MD 11/20/13 [phone]

## 2013-11-20 NOTE — ED Notes (Signed)
Pt c/o n/v/d since Sunday. States she has been around sick people recently that has had pneumonia and flu. Denies any pain

## 2013-11-20 NOTE — Discharge Instructions (Signed)
Viral Gastroenteritis Viral gastroenteritis is also known as stomach flu. This condition affects the stomach and intestinal tract. It can cause sudden diarrhea and vomiting. The illness typically lasts 3 to 8 days. Most people develop an immune response that eventually gets rid of the virus. While this natural response develops, the virus can make you quite ill. CAUSES  Many different viruses can cause gastroenteritis, such as rotavirus or noroviruses. You can catch one of these viruses by consuming contaminated food or water. You may also catch a virus by sharing utensils or other personal items with an infected person or by touching a contaminated surface. SYMPTOMS  The most common symptoms are diarrhea and vomiting. These problems can cause a severe loss of body fluids (dehydration) and a body salt (electrolyte) imbalance. Other symptoms may include:  Fever.  Headache.  Fatigue.  Abdominal pain. DIAGNOSIS  Your caregiver can usually diagnose viral gastroenteritis based on your symptoms and a physical exam. A stool sample may also be taken to test for the presence of viruses or other infections. TREATMENT  This illness typically goes away on its own. Treatments are aimed at rehydration. The most serious cases of viral gastroenteritis involve vomiting so severely that you are not able to keep fluids down. In these cases, fluids must be given through an intravenous line (IV). HOME CARE INSTRUCTIONS   Drink enough fluids to keep your urine clear or pale yellow. Drink small amounts of fluids frequently and increase the amounts as tolerated.  Ask your caregiver for specific rehydration instructions.  Avoid:  Foods high in sugar.  Alcohol.  Carbonated drinks.  Tobacco.  Juice.  Caffeine drinks.  Extremely hot or cold fluids.  Fatty, greasy foods.  Too much intake of anything at one time.  Dairy products until 24 to 48 hours after diarrhea stops.  You may consume probiotics.  Probiotics are active cultures of beneficial bacteria. They may lessen the amount and number of diarrheal stools in adults. Probiotics can be found in yogurt with active cultures and in supplements.  Wash your hands well to avoid spreading the virus.  Only take over-the-counter or prescription medicines for pain, discomfort, or fever as directed by your caregiver. Do not give aspirin to children. Antidiarrheal medicines are not recommended.  Ask your caregiver if you should continue to take your regular prescribed and over-the-counter medicines.  Keep all follow-up appointments as directed by your caregiver. SEEK IMMEDIATE MEDICAL CARE IF:   You are unable to keep fluids down.  You do not urinate at least once every 6 to 8 hours.  You develop shortness of breath.  You notice blood in your stool or vomit. This may look like coffee grounds.  You have abdominal pain that increases or is concentrated in one small area (localized).  You have persistent vomiting or diarrhea.  You have a fever.  The patient is a child younger than 3 months, and he or she has a fever.  The patient is a child older than 3 months, and he or she has a fever and persistent symptoms.  The patient is a child older than 3 months, and he or she has a fever and symptoms suddenly get worse.  The patient is a baby, and he or she has no tears when crying. MAKE SURE YOU:   Understand these instructions.  Will watch your condition.  Will get help right away if you are not doing well or get worse. Document Released: 08/07/2005 Document Revised: 10/30/2011 Document Reviewed: 05/24/2011   ExitCare Patient Information 2014 [employer].  

## 2013-12-09 ENCOUNTER — Other Ambulatory Visit: Payer: Self-pay | Admitting: Internal Medicine

## 2013-12-09 ENCOUNTER — Encounter: Payer: Self-pay | Admitting: Gastroenterology

## 2013-12-09 DIAGNOSIS — Z1231 Encounter for screening mammogram for malignant neoplasm of breast: Secondary | ICD-10-CM

## 2013-12-12 ENCOUNTER — Encounter: Payer: Self-pay | Admitting: Obstetrics & Gynecology

## 2013-12-12 ENCOUNTER — Telehealth: Payer: Self-pay | Admitting: Gynecology

## 2013-12-12 NOTE — Telephone Encounter (Signed)
LMTCB to schedule new patient doctor appointment

## 2013-12-15 ENCOUNTER — Ambulatory Visit: Payer: BC Managed Care – PPO

## 2013-12-15 NOTE — Telephone Encounter (Signed)
lmtcb/[place]

## 2013-12-18 NOTE — Telephone Encounter (Signed)
lmtcb/[place]

## 2013-12-29 ENCOUNTER — Ambulatory Visit (AMBULATORY_SURGERY_CENTER): Payer: Self-pay | Admitting: *Deleted

## 2013-12-29 ENCOUNTER — Telehealth: Payer: Self-pay | Admitting: *Deleted

## 2013-12-29 ENCOUNTER — Other Ambulatory Visit: Payer: Self-pay

## 2013-12-29 VITALS — Ht 64.5 in | Wt 301.8 lb

## 2013-12-29 DIAGNOSIS — Z1211 Encounter for screening for malignant neoplasm of colon: Secondary | ICD-10-CM

## 2013-12-29 MED ORDER — MOVIPREP 100 G PO SOLR: ORAL | Status: DC

## 2013-12-29 NOTE — Telephone Encounter (Signed)
Pt has been scheduled and instructed for colon 01/08/14

## 2013-12-29 NOTE — Telephone Encounter (Signed)
Dr Ardis Hughs; pt came for [facility] today for [place]  Screening colonoscopy 6/1.  Pt has BMI of 51.  Medical history includes asthma and arthritis.  Is she okay for direct hospital colonoscopy or does she need OV with you first?  Thanks, [name]

## 2013-12-29 NOTE — Telephone Encounter (Signed)
Ok for direct to hospital for colon cancer screening (MAC day, check with Patty for my next available time on a THursday, not hosp week).  Thanks

## 2013-12-29 NOTE — Progress Notes (Signed)
No allergies to eggs or soy. No problems with anesthesia.  Pt was not given Emmi instructions for colonoscopy; no computer access  No oxygen use  No diet drug use

## 2013-12-29 NOTE — Telephone Encounter (Signed)
Patty, Pt needs to have colonoscopy at [facility].  I gave her prep instructions while in [place].  She will just need updated times.  Thanks, [name]

## 2013-12-30 ENCOUNTER — Encounter: Payer: Self-pay | Admitting: Gastroenterology

## 2013-12-30 ENCOUNTER — Encounter (HOSPITAL_COMMUNITY): Payer: Self-pay | Admitting: Pharmacy Technician

## 2013-12-30 NOTE — Telephone Encounter (Signed)
Scheduled

## 2013-12-31 ENCOUNTER — Encounter (HOSPITAL_COMMUNITY): Payer: Self-pay | Admitting: *Deleted

## 2014-01-08 ENCOUNTER — Ambulatory Visit (HOSPITAL_COMMUNITY): Payer: BC Managed Care – PPO | Admitting: Anesthesiology

## 2014-01-08 ENCOUNTER — Encounter (HOSPITAL_COMMUNITY)
Admission: RE | Disposition: A | Discharge status: Home / Self Care | Payer: Self-pay | Source: Ambulatory Visit | Attending: Gastroenterology

## 2014-01-08 ENCOUNTER — Encounter (HOSPITAL_COMMUNITY): Payer: Self-pay | Admitting: Gastroenterology

## 2014-01-08 ENCOUNTER — Ambulatory Visit (HOSPITAL_COMMUNITY)
Admission: RE | Admit: 2014-01-08 | Discharge: 2014-01-08 | Disposition: A | Discharge status: Home / Self Care | Payer: BC Managed Care – PPO | Source: Ambulatory Visit | Attending: Gastroenterology | Admitting: Gastroenterology

## 2014-01-08 ENCOUNTER — Encounter (HOSPITAL_COMMUNITY): Payer: BC Managed Care – PPO | Admitting: Anesthesiology

## 2014-01-08 DIAGNOSIS — M129 Arthropathy, unspecified: Secondary | ICD-10-CM | POA: Insufficient documentation

## 2014-01-08 DIAGNOSIS — F411 Generalized anxiety disorder: Secondary | ICD-10-CM | POA: Insufficient documentation

## 2014-01-08 DIAGNOSIS — Z1211 Encounter for screening for malignant neoplasm of colon: Secondary | ICD-10-CM

## 2014-01-08 DIAGNOSIS — J45909 Unspecified asthma, uncomplicated: Secondary | ICD-10-CM | POA: Insufficient documentation

## 2014-01-08 DIAGNOSIS — E669 Obesity, unspecified: Secondary | ICD-10-CM | POA: Insufficient documentation

## 2014-01-08 HISTORY — PX: COLONOSCOPY WITH PROPOFOL: SHX5780

## 2014-01-08 SURGERY — COLONOSCOPY WITH PROPOFOL
Anesthesia: Monitor Anesthesia Care

## 2014-01-08 MED ORDER — FENTANYL CITRATE 0.05 MG/ML IJ SOLN
INTRAMUSCULAR | Status: DC | PRN
  Administered 2014-01-08: 100 ug via INTRAVENOUS

## 2014-01-08 MED ORDER — PROPOFOL 10 MG/ML IV BOLUS
INTRAVENOUS | Status: AC
  Filled 2014-01-08: qty 20

## 2014-01-08 MED ORDER — ONDANSETRON HCL 4 MG/2ML IJ SOLN
INTRAMUSCULAR | Status: DC | PRN
  Administered 2014-01-08 (×2): 2 mg via INTRAVENOUS

## 2014-01-08 MED ORDER — MIDAZOLAM HCL 2 MG/2ML IJ SOLN
INTRAMUSCULAR | Status: AC
  Filled 2014-01-08: qty 2

## 2014-01-08 MED ORDER — PROPOFOL INFUSION 10 MG/ML OPTIME
INTRAVENOUS | Status: DC | PRN
  Administered 2014-01-08: 75 ug/kg/min via INTRAVENOUS

## 2014-01-08 MED ORDER — KETAMINE HCL 10 MG/ML IJ SOLN
INTRAMUSCULAR | Status: AC
  Filled 2014-01-08: qty 1

## 2014-01-08 MED ORDER — FENTANYL CITRATE 0.05 MG/ML IJ SOLN
INTRAMUSCULAR | Status: AC
  Filled 2014-01-08: qty 2

## 2014-01-08 MED ORDER — MIDAZOLAM HCL 5 MG/5ML IJ SOLN
INTRAMUSCULAR | Status: DC | PRN
  Administered 2014-01-08: 2 mg via INTRAVENOUS

## 2014-01-08 MED ORDER — ONDANSETRON HCL 4 MG/2ML IJ SOLN
INTRAMUSCULAR | Status: AC
  Filled 2014-01-08: qty 2

## 2014-01-08 MED ORDER — LACTATED RINGERS IV SOLN
INTRAVENOUS | Status: DC | PRN
  Administered 2014-01-08: 11:00:00 via INTRAVENOUS

## 2014-01-08 MED ORDER — KETAMINE HCL 10 MG/ML IJ SOLN
INTRAMUSCULAR | Status: DC | PRN
  Administered 2014-01-08: 10 mg via INTRAVENOUS
  Administered 2014-01-08: 15 mg via INTRAVENOUS

## 2014-01-08 SURGICAL SUPPLY — 22 items

## 2014-01-08 NOTE — Anesthesia Preprocedure Evaluation (Signed)
Anesthesia Evaluation  Patient identified by MRN, date of birth, ID band Patient awake    Reviewed: Allergy & Precautions, H&P , NPO status , Patient's Chart, lab work & pertinent test results  Airway Mallampati: II TM Distance: >3 FB Neck ROM: Full    Dental no notable dental hx.    Pulmonary neg pulmonary ROS, asthma ,  breath sounds clear to auscultation  Pulmonary exam normal       Cardiovascular negative cardio ROS  Rhythm:Regular Rate:Normal     Neuro/Psych negative neurological ROS  negative psych ROS   GI/Hepatic negative GI ROS, Neg liver ROS,   Endo/Other  negative endocrine ROSMorbid obesity  Renal/GU negative Renal ROS  negative genitourinary   Musculoskeletal negative musculoskeletal ROS (+)   Abdominal   Peds negative pediatric ROS (+)  Hematology negative hematology ROS (+)   Anesthesia Other Findings   Reproductive/Obstetrics negative OB ROS                           Anesthesia Physical Anesthesia Plan  ASA: III  Anesthesia Plan: MAC   Post-op Pain Management:    Induction:   Airway Management Planned: Simple Face Mask  Additional Equipment:   Intra-op Plan:   Post-operative Plan:   Informed Consent: I have reviewed the patients History and Physical, chart, labs and discussed the procedure including the risks, benefits and alternatives for the proposed anesthesia with the patient or authorized representative who has indicated his/her understanding and acceptance.   Dental advisory given  Plan Discussed with: CRNA  Anesthesia Plan Comments:         Anesthesia Quick Evaluation

## 2014-01-08 NOTE — Discharge Instructions (Signed)

## 2014-01-08 NOTE — Anesthesia Postprocedure Evaluation (Signed)
  Anesthesia Post-op Note  Patient: Nicole Hines  Procedure(s) Performed: Procedure(s) (LRB): COLONOSCOPY WITH PROPOFOL (N/A)  Patient Location: PACU  Anesthesia Type: MAC  Level of Consciousness: awake and alert   Airway and Oxygen Therapy: Patient Spontanous Breathing  Post-op Pain: mild  Post-op Assessment: Post-op Vital signs reviewed, Patient's Cardiovascular Status Stable, Respiratory Function Stable, Patent Airway and No signs of Nausea or vomiting  Last Vitals:  Filed Vitals:   01/08/14 1207  BP: 125/64  Pulse: 68  Temp:   Resp: 10    Post-op Vital Signs: stable   Complications: No apparent anesthesia complications

## 2014-01-08 NOTE — Transfer of Care (Signed)
Immediate Anesthesia Transfer of Care Note  Patient: Nicole Hines  Procedure(s) Performed: Procedure(s): COLONOSCOPY WITH PROPOFOL (N/A)  Patient Location: PACU  Anesthesia Type:MAC  Level of Consciousness: awake, alert , oriented and patient cooperative  Airway & Oxygen Therapy: Patient Spontanous Breathing and Patient connected to nasal cannula oxygen  Post-op Assessment: Report given to PACU RN and Post -op Vital signs reviewed and stable  Post vital signs: stable  Complications: No apparent anesthesia complications

## 2014-01-08 NOTE — H&P (Signed)
  HPI: This is a woman at routine risk for colon cancer. BMI>50    Past Medical History  Diagnosis Date  . Asthma   . Anxiety   . Arthritis     Past Surgical History  Procedure Laterality Date  . Laparoscopic cholecystectomy  1994  . Dilation and curettage of uterus  1990    No current facility-administered medications for this encounter.    Allergies as of 12/29/2013  . (No Known Allergies)    Family History  Problem Relation Age of Onset  . Colon cancer Neg Hx     History   Social History  . Marital Status: Divorced    Spouse Name: N/A    Number of Children: N/A  . Years of Education: N/A   Occupational History  . Not on file.   Social History Main Topics  . Smoking status: Never Smoker   . Smokeless tobacco: Never Used  . Alcohol Use: No  . Drug Use: No  . Sexual Activity: Not on file   Other Topics Concern  . Not on file   Social History Narrative  . No narrative on file      Physical Exam: BP 159/87  Pulse 82  Temp(Src) 98.2 F (36.8 C) (Oral)  Resp 16  LMP 12/03/2013 Constitutional: generally well-appearing Psychiatric: alert and oriented x3 Abdomen: soft, nontender, nondistended, no obvious ascites, no peritoneal signs, normal bowel sounds     Assessment and plan: 53 y.o. female with massive obesity, routine risk fo colon cancer   colonoscoy today

## 2014-01-08 NOTE — Op Note (Signed)
[facility] [place], [id number]   COLONOSCOPY PROCEDURE REPORT  PATIENT: Hines, Nicole Fomby.  MR#: [id number] BIRTHDATE: 20-Oct-1960 , 53  yrs. old GENDER: Female ENDOSCOPIST: [name], MD REFERRED YB:OFBP Virgina Jock, M.D. PROCEDURE DATE:  01/08/2014 PROCEDURE:   Colonoscopy, screening First Screening Colonoscopy - Avg.  risk and is 50 yrs.  old or older Yes.  Prior Negative Screening - Now for repeat screening. N/A  History of Adenoma - Now for follow-up colonoscopy & has been > or = to 3 yrs.  N/A  Polyps Removed Today? No.  Recommend repeat exam, <10 yrs? No. ASA CLASS:   Class IV INDICATIONS:average risk screening. MEDICATIONS: MAC sedation, administered by CRNA  DESCRIPTION OF PROCEDURE:   After the risks benefits and alternatives of the procedure were thoroughly explained, informed consent was obtained.  A digital rectal exam revealed no abnormalities of the rectum.   The Pentax Ped Colon [id number] endoscope was introduced through the anus and advanced to the cecum, which was identified by both the appendix and ileocecal valve. No adverse events experienced.   The quality of the prep was excellent.  The instrument was then slowly withdrawn as the colon was fully examined.      COLON FINDINGS: A normal appearing cecum, ileocecal valve, and appendiceal orifice were identified.  The ascending, hepatic flexure, transverse, splenic flexure, descending, sigmoid colon and rectum appeared unremarkable.  No polyps or cancers were seen. Retroflexed views revealed no abnormalities. The time to cecum=3 minutes 00 seconds.  Withdrawal time=8 minutes 00 seconds.  The scope was withdrawn and the procedure completed. COMPLICATIONS: There were no complications.  ENDOSCOPIC IMPRESSION: Normal colon  RECOMMENDATIONS: You should continue to follow colorectal cancer screening guidelines for "routine risk" patients with a repeat colonoscopy in 10  years. There is no need for other screening (including FOBT (stool) testing) before that time.   eSigned:  [name], MD 01/08/2014 11:47 AM

## 2014-01-09 ENCOUNTER — Encounter (HOSPITAL_COMMUNITY): Payer: Self-pay | Admitting: Gastroenterology

## 2014-01-19 ENCOUNTER — Encounter: Payer: BC Managed Care – PPO | Admitting: Gastroenterology

## 2014-01-26 ENCOUNTER — Ambulatory Visit (INDEPENDENT_AMBULATORY_CARE_PROVIDER_SITE_OTHER): Payer: BC Managed Care – PPO | Admitting: Certified Nurse Midwife

## 2014-01-26 ENCOUNTER — Encounter: Payer: Self-pay | Admitting: Certified Nurse Midwife

## 2014-01-26 VITALS — BP 124/80 | HR 80 | Temp 98.0°F | Resp 20 | Ht 63.25 in | Wt 295.0 lb

## 2014-01-26 DIAGNOSIS — B373 Candidiasis of vulva and vagina: Secondary | ICD-10-CM

## 2014-01-26 DIAGNOSIS — Z124 Encounter for screening for malignant neoplasm of cervix: Secondary | ICD-10-CM

## 2014-01-26 DIAGNOSIS — B3731 Acute candidiasis of vulva and vagina: Secondary | ICD-10-CM

## 2014-01-26 DIAGNOSIS — B372 Candidiasis of skin and nail: Secondary | ICD-10-CM

## 2014-01-26 DIAGNOSIS — Z01419 Encounter for gynecological examination (general) (routine) without abnormal findings: Secondary | ICD-10-CM

## 2014-01-26 MED ORDER — NYSTATIN-TRIAMCINOLONE 100000-0.1 UNIT/GM-% EX CREA: 1.0000 "application " | TOPICAL_CREAM | Freq: Two times a day (BID) | CUTANEOUS | Status: DC

## 2014-01-26 MED ORDER — NYSTATIN 100000 UNIT/GM EX POWD: Freq: Three times a day (TID) | CUTANEOUS | Status: DC

## 2014-01-26 NOTE — Patient Instructions (Addendum)
EXERCISE AND DIET:  We recommended that you start or continue a regular exercise program for good health. Regular exercise means any activity that makes your heart beat faster and makes you sweat.  We recommend exercising at least 30 minutes per day at least 3 days a week, preferably 4 or 5.  We also recommend a diet low in fat and sugar.  Inactivity, poor dietary choices and obesity can cause diabetes, heart attack, stroke, and kidney damage, among others.    ALCOHOL AND SMOKING:  Women should limit their alcohol intake to no more than 7 drinks/beers/glasses of wine (combined, not each!) per week. Moderation of alcohol intake to this level decreases your risk of breast cancer and liver damage. And of course, no recreational drugs are part of a healthy lifestyle.  And absolutely no smoking or even second hand smoke. Most people know smoking can cause heart and lung diseases, but did you know it also contributes to weakening of your bones? Aging of your skin?  Yellowing of your teeth and nails?  CALCIUM AND VITAMIN D:  Adequate intake of calcium and Vitamin D are recommended.  The recommendations for exact amounts of these supplements seem to change often, but generally speaking 600 mg of calcium (either carbonate or citrate) and 800 units of Vitamin D per day seems prudent. Certain women may benefit from higher intake of Vitamin D.  If you are among these women, your doctor will have told you during your visit.    PAP SMEARS:  Pap smears, to check for cervical cancer or precancers,  have traditionally been done yearly, although recent scientific advances have shown that most women can have pap smears less often.  However, every woman still should have a physical exam from her gynecologist every year. It will include a breast check, inspection of the vulva and vagina to check for abnormal growths or skin changes, a visual exam of the cervix, and then an exam to evaluate the size and shape of the uterus and  ovaries.  And after 52 years of age, a rectal exam is indicated to check for rectal cancers. We will also provide age appropriate advice regarding health maintenance, like when you should have certain vaccines, screening for sexually transmitted diseases, bone density testing, colonoscopy, mammograms, etc.   MAMMOGRAMS:  All women over 58 years old should have a yearly mammogram. Many facilities now offer a "3D" mammogram, which may cost around $50 extra out of pocket. If possible,  we recommend you accept the option to have the 3D mammogram performed.  It both reduces the number of women who will be called back for extra views which then turn out to be normal, and it is better than the routine mammogram at detecting truly abnormal areas.    COLONOSCOPY:  Colonoscopy to screen for colon cancer is recommended for all women at age 56.  We know, you hate the idea of the prep.  We agree, BUT, having colon cancer and not knowing it is worse!!  Colon cancer so often starts as a polyp that can be seen and removed at colonscopy, which can quite literally save your life!  And if your first colonoscopy is normal and you have no family history of colon cancer, most women don't have to have it again for 10 years.  Once every ten years, you can do something that may end up saving your life, right?  We will be happy to help you get it scheduled when you are ready.  Be sure to check your insurance coverage so you understand how much it will cost.  It may be covered as a preventative service at no cost, but you should check your particular policy.     Yeast Infection of the Skin Some yeast on the skin is normal, but sometimes it causes an infection. If you have a yeast infection, it shows up as white or light brown patches on brown skin. You can see it better in the summer on tan skin. It causes light-colored holes in your suntan. It can happen on any area of the body. This cannot be passed from person to person. HOME  CARE  Scrub your skin daily with a dandruff shampoo. Your rash may take a couple weeks to get well.  Do not scratch or itch the rash. GET HELP RIGHT AWAY IF:   You get another infection from scratching. The skin may get warm, red, and may ooze fluid.  The infection does not seem to be getting better. MAKE SURE YOU:  Understand these instructions.  Will watch your condition.  Will get help right away if you are not doing well or get worse. Document Released: 07/20/2008 Document Revised: 10/30/2011 Document Reviewed: 07/20/2008 [facility] Patient Information 2014 [place].

## 2014-01-26 NOTE — Progress Notes (Signed)
53 y.o. [id number] Divorced Caucasian Fe here to establish gyn care and  for annual exam. Periods changing with onset every 2 months with heavy or light bleeding. No cramping issues. Occasional hot flashes, and night sweats. Sees PCP for yearly exam and medication management for anxiety/ asthma/ and arthritis. All medication dosage stable. All labs done with PCP Complaining of irritation and itching under breasts and in groin area again, chronic problem. 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 Uses powder under breasts and cloth to catch perspiration. 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 Has only been treated with oral Diflucan in the past. 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 Healed well without problems. Denies vaginal discharge or itching or new personal products. Colonoscopy end of May negative, 10 year follow up. No other health issues today.  Patient's last menstrual period was 11/24/2013.          Sexually active: yes  Female partner, female in past with children  The current method of family planning is tubal ligation.    Exercising: yes  walking Smoker:  no  Health Maintenance: Pap:  2013 neg no abnormal pap smear MMG:  2011 per patient, has information to schedule Colonoscopy:  2015 negative BMD:   none TDaP: Up to date Labs: none Self breast exam: not done   reports that she has never smoked. She has never used smokeless tobacco. She reports that she does not drink alcohol or use illicit drugs.  Past Medical History  Diagnosis Date  . Asthma   . Anxiety   . Arthritis   . Amenorrhea     irreg cycle  . MRSA infection     Past Surgical History  Procedure Laterality Date  . Laparoscopic cholecystectomy  1994  . Dilation and curettage of uterus  1990  . Colonoscopy with propofol N/A 01/08/2014    Procedure: COLONOSCOPY WITH PROPOFOL;  Surgeon: [name], MD;  Location: [facility];  Service:  Endoscopy;  Laterality: N/A;  . Mrsa      had to have area cut out. abdomen    Current Outpatient Prescriptions  Medication Sig Dispense Refill  . ALPRAZolam (XANAX) 0.5 MG tablet Take 0.5-1 mg by mouth at bedtime as needed for sleep. For anxiety      . Calcium Carbonate-Vitamin D (CALCIUM 600+D3 PO) Take 1 tablet by mouth 2 (two) times daily.      . citalopram (CELEXA) 40 MG tablet Take 40 mg by mouth every morning.       . fish oil-omega-3 fatty acids 1000 MG capsule Take 1 g by mouth 2 (two) times daily.      . Fluticasone-Salmeterol (ADVAIR) 250-50 MCG/DOSE AEPB Inhale 1 puff into the lungs 2 (two) times daily.      . Glucosamine-Chondroit-Vit C-Mn (GLUCOSAMINE 1500 COMPLEX PO) Take 1 capsule by mouth 2 (two) times daily.      . meloxicam (MOBIC) 15 MG tablet Take 15 mg by mouth daily.      . montelukast (SINGULAIR) 10 MG tablet Take 10 mg by mouth every morning.       . Multiple Vitamin (MULTIVITAMIN WITH MINERALS) TABS tablet Take 1 tablet by mouth daily.      Marland Kitchen oxyCODONE-acetaminophen (PERCOCET) 10-325 MG per tablet Take 1 tablet by mouth every 6 (six) hours as needed for pain.       . vitamin B-12 (CYANOCOBALAMIN) 1000 MCG tablet Take 1,000  mcg by mouth daily.       No current facility-administered medications for this visit.    Family History  Problem Relation Age of Onset  . Colon cancer Neg Hx   . Cancer Mother     pancreatic  . Diabetes Mother   . Hypertension Mother   . Heart attack Father   . Diabetes Brother   . Cancer Maternal Aunt     uterine  . Diabetes Maternal Aunt   . Heart attack Maternal Grandmother     ROS:  Pertinent items are noted in HPI.  Otherwise, a comprehensive ROS was negative.  Exam:   BP 124/80  Pulse 80  Temp(Src) 98 F (36.7 C) (Oral)  Resp 20  Ht 5' 3.25" (1.607 m)  Wt 295 lb (133.811 kg)  BMI 51.82 kg/m2  LMP 11/24/2013 Height: 5' 3.25" (160.7 cm)  Ht Readings from Last 3 Encounters:  01/26/14 5' 3.25" (1.607 m)  12/29/13 5'  4.5" (1.638 m)    General appearance: alert, cooperative and appears stated age Head: Normocephalic, without obvious abnormality, atraumatic Neck: no adenopathy, supple, symmetrical, trachea midline and thyroid normal to inspection and palpation and non-palpable Lungs: clear to auscultation bilaterally Breasts: normal appearance, no masses or tenderness, No nipple retraction or dimpling, No nipple discharge or bleeding, No axillary or supraclavicular adenopathy, scaly area with scant exudate bilateral under breast tissue on skin of chest wall only. Wet prep taken Heart: regular rate and rhythm Abdomen: soft, non-tender; no masses,  no organomegaly Extremities: extremities normal, atraumatic, no cyanosis or edema. Small abrasion on left lower leg from shaving, no infection sign noted. Skin: Skin color, texture, turgor normal. No rashes or lesions Lymph nodes: Cervical, supraclavicular, and axillary nodes normal. No abnormal inguinal nodes palpated Neurologic: Grossly normal   Pelvic: External genitalia:  no lesions              Urethra:  normal appearing urethra with no masses, tenderness or lesions              Bartholin's and Skene's: normal                 Vagina: normal appearing vagina with normal color and discharge, no lesions wet prep taken ph 4.0              Cervix: normal, non tender              Pap taken: yes Bimanual Exam:  Uterus:  normal size, contour, position, consistency, mobility, non-tender and midposition              Adnexa: normal adnexa and no mass, fullness, tenderness               Rectovaginal: Confirms               Anus: defer due to colonoscopy in past 3 weeks   Wet prep vagina negative, skin under breast and groin area positive for yeast.  A:  Well Woman with normal exam  Contraception BTL female partner  Yeast dermatitis  Perimenopausal with cycle change  History of anxiety,arthritis, asthma under PCP management with stable medication  History of  MRSA with history of incision and drainage on abdomen.  P:   Reviewed health and wellness pertinent to exam  Reviewed findings and etiology of yeast dermatitis, excessive prolonged moisture exposure,which increases risk of occurrence. Discussed changing bra after working outside and washing thoroughly and doing the same with underwear to decrease occurrence. Patient  feels she can do this. Discussed daily Adult refrigerated probiotic to help with immune status for prevention. Rx Mycolog 2 see order with instructions to use x 1 week Rx Nystatin powder to follow the second week prior to outside work, see order. Recheck in office 3 weeks for progress. Patient has been checked for glucose issues this year with PCP. Patient agreeable to plan.  Discussed etiology of perimenopausal and bleeding expectations. Discussed importance of notifying if no menses in three months, due to risk of heavy period or increase of hyperplasia. Patient given menses calendar to record and voiced understanding and will call if no menses in 3 months.  Continue follow up with PCP as indicated.  Pap smear taken today with [facility]   counseled on mammography screening given information to schedule, adequate intake of calcium and vitamin D, diet and exercise for health and well being and decrease of other health issues.  return annually, as above or prn  An After Visit Summary was printed and given to the patient.

## 2014-01-28 LAB — IPS PAP TEST WITH HPV

## 2014-01-31 NOTE — Progress Notes (Signed)
Reviewed personally.  M. Suzanne Ledonna Dormer, MD.  

## 2014-02-16 ENCOUNTER — Ambulatory Visit: Payer: BC Managed Care – PPO | Admitting: Certified Nurse Midwife

## 2014-02-16 ENCOUNTER — Telehealth: Payer: Self-pay | Admitting: Certified Nurse Midwife

## 2014-02-16 NOTE — Telephone Encounter (Signed)
Called pt to reschedule her missed appt today. She says she tried to contact us to let office know but didn't leave any message.

## 2014-02-16 NOTE — Telephone Encounter (Signed)
Did patient reschedule?

## 2014-02-16 NOTE — Telephone Encounter (Signed)
Yes ma'am, she is coming in tomorrow.

## 2014-02-17 ENCOUNTER — Encounter: Payer: Self-pay | Admitting: Certified Nurse Midwife

## 2014-02-17 ENCOUNTER — Ambulatory Visit (INDEPENDENT_AMBULATORY_CARE_PROVIDER_SITE_OTHER): Payer: BC Managed Care – PPO | Admitting: Certified Nurse Midwife

## 2014-02-17 VITALS — BP 110/64 | HR 68 | Temp 98.0°F | Resp 16 | Ht 63.25 in | Wt 294.0 lb

## 2014-02-17 DIAGNOSIS — N951 Menopausal and female climacteric states: Secondary | ICD-10-CM

## 2014-02-17 DIAGNOSIS — N912 Amenorrhea, unspecified: Secondary | ICD-10-CM

## 2014-02-17 DIAGNOSIS — Z22322 Carrier or suspected carrier of Methicillin resistant Staphylococcus aureus: Secondary | ICD-10-CM

## 2014-02-17 DIAGNOSIS — Z8614 Personal history of Methicillin resistant Staphylococcus aureus infection: Secondary | ICD-10-CM

## 2014-02-17 LAB — TSH: TSH: 1.118 u[IU]/mL (ref 0.350–4.500)

## 2014-02-17 MED ORDER — SULFAMETHOXAZOLE-TMP DS 800-160 MG PO TABS: ORAL_TABLET | ORAL | Status: DC

## 2014-02-17 NOTE — Progress Notes (Signed)
53 y.o. Divorced Caucasian female [phone] here for follow up of yeast dermatitis under breast treated with Mycolog cream and Nystatin powder initiated on 01/26/14. Completed all medication as directed. 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 Usually use a cloth to keep breast from becoming irritated. This is better, but has developed some open areas along breast edge from bra? Rubbing or sweating. She admits to squeezing area occasionally, but they are not healing. Patient history of MRSA in arm wound and aware of concern that is why she is here. Denies pain, or redness, chills or fever with breast issue. Also has not started menses and was to notify if no menses by the end of this month. Having hot flashes and night sweats occasionally now. Female/female relationship.   O: Healthy WD,WN female Affect: normal Skin:warm and dry Breasts: along breasts inner edge small pea size macules that are in different stages of healing noted. Slight increase pink in area. No pus noted. Culture taken of all areas. No axillary enlarged lymph nodes. See picture    A:HIstory of MRSA with breasts skin healing areas, no abscess noted Perimenopausal with amenorrhea  P: Discussed findings of areas of breast and need for treatment. Instructed to do epsom salt soak to areas and not to squeeze areas. Warning signs of abscess given. Rx Bactrim DS see order Discussed evaluated for menopause with labs..Recent aex with pelvic exam 2 weeks ago. Lab: FSH,TSH,Prolactin Will advise when labs in and discussed Provera challenge due to amenorrhea. Patient agreeable.   RV 2 days for recheck.

## 2014-02-17 NOTE — Patient Instructions (Signed)
MRSA Infection MRSA stands for methicillin-resistant Staphylococcus aureus. This type of infection is caused by Staphylococcus aureus bacteria that are no longer affected by the medicines used to kill them (drug resistant). Staphylococcus (staph) bacteria are normally found on the skin or in the nose of healthy people. In most cases, these bacteria do not cause infection. But if these resistant bacteria enter your body through a cut or sore, they can cause a serious infection on your skin or in other parts of your body. There is a slight chance that the staph on your skin or in your nose is MRSA. There are two types of MRSA infections:  Hospital-acquired MRSA is bacteria that you get in the hospital.  Community-acquired MRSA is bacteria that you get somewhere other than in a hospital. RISK FACTORS Hospital-acquired MRSA is more common. You could be at risk for this infection if you are in the hospital and you:  Have surgery or a procedure.  Have an IV access or a catheter tube placed in your body.  Have weak resistance to germs (weakened immune system).  Are elderly.  Are on kidney dialysis. You could be at risk for community-acquired MRSA if you have a break in your skin and come into contact with MRSA. This may happen if you:  Play sports where there is skin-to-skin contact.  Live in a crowded setting, like a dormitory or a [employer]  Share towels, razors, or sports equipment with other people. SYMPTOMS  Symptoms of hospital-acquired MRSA depend on where MRSA has spread. Symptoms may include:  Wound infection.  Skin infection.  Rash.  Pneumonia.  Fever and chills.  Difficulty breathing.  Chest pain. Community-acquired MRSA is most likely to start as a scratch or cut that becomes infected. Symptoms may include:  A pus-filled pimple.  A boil on your skin.  Pus draining from your skin.  A sore (abscess) under your skin or somewhere in your body.  Fever  with or without chills. DIAGNOSIS  The diagnosis of MRSA is made by taking a sample from an infected area and sending it to a lab for testing. A lab technician can grow (culture) MRSA and check it under a microscope. The cultured MRSA can be tested to see which type of antibiotic medicine will work to treat it. Newer tests can identify MRSA more quickly by testing bacteria samples for MRSA genes. Your health care provider can diagnose MRSA using samples from:   Cuts or wounds in infected areas.  Nasal swabs.  Saliva or cough specimens from deep in the lungs (sputum).  Urine.  Blood. You may also have:  Imaging studies (such as X-ray or MRI) to check if the infection has spread to the lungs, bones, or joints.  A culture and sensitivity test of blood or fluids from inside the joints. TREATMENT  Treatment depends on how severe, deep, or extensive the infection is. Very bad infections may require a hospital stay.  Some skin infections, such as a small boil or sore (abscess), may be treated by draining pus from the site of the infection.  More extensive surgery to drain pus may be necessary for deeper or more widespread soft tissue infections.  You may then have to take antibiotic medicine given by mouth or through a vein. You may start antibiotic treatment right away or after testing can be done to see what antibiotic medicine should be used. HOME CARE INSTRUCTIONS   Take your antibiotics as directed by your health care provider. Take  the medicine as prescribed until it is finished.  Avoid close contact with those around you as much as possible. Do not use towels, razors, toothbrushes, bedding, or other items that will be used by others.  Wash your hands frequently for 15 seconds with soap and water. Dry your hands with a clean or disposable towel.  When you are not able to wash your hands, use hand sanitizer that is more than 60 percent alcohol.  Wash towels, sheets, or clothes in  the washing machine with detergent and hot water. Dry them in a hot dryer.  Follow your health care provider's instructions for wound care. Wash your hands before and after changing your bandages.  Always shower after exercising.  Keep all cuts and scrapes clean and covered with a bandage.  Be sure to tell all your health care providers that you have MRSA so they are aware of your infection. SEEK MEDICAL CARE IF:  You have a cut, scrape, pimple, or boil that becomes red, swollen, or painful or has pus in it.  You have pus draining from your skin.  You have an abscess under your skin or somewhere in your body. SEEK IMMEDIATE MEDICAL CARE IF:   You have symptoms of a skin infection with a fever or chills.  You have trouble breathing.  You have chest pain.  You have a skin wound and you become nauseous or start vomiting. MAKE SURE YOU:  Understand these instructions.  Will watch your condition.  Will get help right away if you are not doing well or get worse. Document Released: 08/07/2005 Document Revised: 08/12/2013 Document Reviewed: 05/30/2013 [facility] Patient Information 2015 [place], [place]. This information is not intended to replace advice given to you by your health care provider. Make sure you discuss any questions you have with your health care provider.

## 2014-02-18 LAB — FOLLICLE STIMULATING HORMONE: FSH: 8.9 m[IU]/mL

## 2014-02-18 LAB — PROLACTIN: PROLACTIN: 14.9 ng/mL

## 2014-02-19 ENCOUNTER — Encounter: Payer: Self-pay | Admitting: Certified Nurse Midwife

## 2014-02-19 ENCOUNTER — Ambulatory Visit (INDEPENDENT_AMBULATORY_CARE_PROVIDER_SITE_OTHER): Payer: BC Managed Care – PPO | Admitting: Certified Nurse Midwife

## 2014-02-19 VITALS — BP 118/78 | HR 70 | Resp 16 | Ht 63.25 in | Wt 293.0 lb

## 2014-02-19 DIAGNOSIS — L089 Local infection of the skin and subcutaneous tissue, unspecified: Secondary | ICD-10-CM

## 2014-02-19 NOTE — Progress Notes (Signed)
53 y.o. Divorced Caucasian female [phone] here for follow up of right and left breast skin infection, no abscess  treated with Bactrim DS initiated on 02/17/14. Patient taking medication as directed, has no applied soaks to area due to work, but plans to start today. Denies redness or pus discharge or pain. "Feels better". Patient was also seen for amenorrhea ? Menopause symptoms. She has started her period now with normal amount of bleeding per patient. O: Healthy WD,WN female Affect: Normal Skin:warm and dry Axillary lymph nodes not enlarged Inner edge of both breasts, small pea size macules are present and appeared to be closing. No pus discharge, no redness or tenderness. No masses or nipple discharge bilateral.   A Breast skin infection responding to Bactrim DS Culture still pending, history of MRSA Amenorrhea resolved with period onset, but history of cycle change with perimenopause    P: Discussed findings of areas of breast are not increasing and appear to be responding to medication. Encouraged epsom salt soak use. Continue medication as indicated. Recheck in one week. Will advise when culture results in.  Discussed lab [place] did not indicate menopause, TSH and prolactin normal Instructed to keep menses calendar and advise if no menses in 3 months. Patient agreeable   RV one week, prn

## 2014-02-20 LAB — WOUND CULTURE
Gram Stain: NONE SEEN
Gram Stain: NONE SEEN

## 2014-02-23 NOTE — Progress Notes (Signed)
Reviewed personally.  M. Suzanne Flora Parks, MD.  

## 2014-02-23 NOTE — Progress Notes (Signed)
Reviewed personally.  M. Suzanne Seanne Chirico, MD.  

## 2014-02-24 ENCOUNTER — Telehealth: Payer: Self-pay

## 2014-02-24 NOTE — Telephone Encounter (Signed)
Unable to leave message. Try again.

## 2014-02-24 NOTE — Telephone Encounter (Signed)
Message copied by Susy Manor on Tue Feb 24, 2014 10:27 AM ------      Message from: Kem Boroughs R      Created: Mon Feb 23, 2014  6:00 PM       Let patient know that there is no  MRSA - but she does have an infection - staph and Septra is correct med's to treat.  Have her to follow [place] indicated by Ms. Debbie. ------

## 2014-02-27 ENCOUNTER — Encounter: Payer: Self-pay | Admitting: Certified Nurse Midwife

## 2014-02-27 ENCOUNTER — Ambulatory Visit (INDEPENDENT_AMBULATORY_CARE_PROVIDER_SITE_OTHER): Payer: BC Managed Care – PPO | Admitting: Certified Nurse Midwife

## 2014-02-27 VITALS — BP 110/64 | HR 68 | Resp 16 | Ht 63.5 in | Wt 297.0 lb

## 2014-02-27 DIAGNOSIS — B372 Candidiasis of skin and nail: Secondary | ICD-10-CM

## 2014-02-27 DIAGNOSIS — L089 Local infection of the skin and subcutaneous tissue, unspecified: Secondary | ICD-10-CM

## 2014-02-27 MED ORDER — MUPIROCIN 2 % EX OINT: TOPICAL_OINTMENT | CUTANEOUS | Status: DC

## 2014-02-27 MED ORDER — NYSTATIN-TRIAMCINOLONE 100000-0.1 UNIT/GM-% EX OINT: 1.0000 "application " | TOPICAL_OINTMENT | Freq: Two times a day (BID) | CUTANEOUS | Status: DC

## 2014-02-27 NOTE — Telephone Encounter (Signed)
Patient notified of results. See lab 

## 2014-02-27 NOTE — Progress Notes (Signed)
53 y.o. Divorced Caucasian female [phone] here for follow up of skin infection being treated with Bactrim DS initiated on 02/17/14.Marland Kitchen Continuing all medication as directed.  Denies any symptoms of fever or pus from areas of concern. Patient has developed two more on the left inner crease of breast. Right breast area per patient much better. Patient has been using epsom salt soaks which has helped. She now has the yeast like smell under breasts from sweating again. No fever or chills or redness. No other issues today.    O: Healthy WD,WN female Affect: Normal Left breast original one area healing 2 new areas noted on inside of crease of breast,no pus, slight redness, non tender Right breast all 4 previous areas closed and healing Area under breast bilateral slightly red with exudate with wet prep taken. Axillary nodes not enlarged bilateral  Wet prep: positive for yeast    A:Left breast skin infection resolving Right Breast skin infection previous area resolving, but two new areas noted pea size Yeast dermatitis under breast  P: Discussed findings of healed areas and to continue antibiotics but adding Bactroban ointment for topical use to open areas. Continue epsom salt soaks and complete oral antibiotics. Reviewed findings of yeast again under breast and to restart Mycolog ointment after drying area. Rx Bactroban see order Rx Mycolog See order Questions addressed.  RV 2 weeks unless further areas occur.   Dr.Lathrop examined patient and concurred with finding.

## 2014-02-27 NOTE — Progress Notes (Signed)
Reviewed personally.  M. Suzanne Tristan Proto, MD.  

## 2014-03-16 ENCOUNTER — Ambulatory Visit: Payer: BC Managed Care – PPO | Admitting: Certified Nurse Midwife

## 2014-03-16 ENCOUNTER — Ambulatory Visit (INDEPENDENT_AMBULATORY_CARE_PROVIDER_SITE_OTHER): Payer: BC Managed Care – PPO | Admitting: Certified Nurse Midwife

## 2014-03-16 ENCOUNTER — Encounter: Payer: Self-pay | Admitting: Certified Nurse Midwife

## 2014-03-16 ENCOUNTER — Telehealth: Payer: Self-pay | Admitting: Certified Nurse Midwife

## 2014-03-16 VITALS — BP 120/64 | HR 76 | Resp 18 | Ht 63.5 in | Wt 301.0 lb

## 2014-03-16 DIAGNOSIS — L089 Local infection of the skin and subcutaneous tissue, unspecified: Secondary | ICD-10-CM

## 2014-03-16 NOTE — Telephone Encounter (Signed)
Left msg regarding appointment dr cx and rs to 10:30am 03/16/14.

## 2014-03-16 NOTE — Telephone Encounter (Signed)
Patient rescheduled to 06/17/14 at 12:45

## 2014-03-16 NOTE — Progress Notes (Signed)
53 y.o. Divorced Caucasian female [phone] here for follow up of skin infection being treated with Bactorban ointment initiated on 02/27/14. Completed all medication as directed.  Denies any symptoms of new areas of skin infection on breasts! Using cornstarch under breast which is controlling yeast now. Changing bra daily and applying Desinex spray powder only if itching. "Much better". No other health issues today.    O: Healthy WD,WN female Affect: Normal   A:Right and left breast skin infection between breasts and  At inner area of breast totally closed and healed. Yeast being controlled with OTC medication.    P: Discussed findings of all areas of breast being healed. Patient very happy with outcome. Notify if reoccurrence.  RV prn

## 2014-03-16 NOTE — Progress Notes (Signed)
Reviewed personally.  M. Suzanne Shirely Toren, MD.  

## 2014-03-17 ENCOUNTER — Ambulatory Visit: Payer: BC Managed Care – PPO | Admitting: Certified Nurse Midwife

## 2014-06-01 ENCOUNTER — Other Ambulatory Visit: Payer: Self-pay | Admitting: Sports Medicine

## 2014-06-01 DIAGNOSIS — M7661 Achilles tendinitis, right leg: Secondary | ICD-10-CM

## 2014-06-16 ENCOUNTER — Ambulatory Visit
Admission: RE | Admit: 2014-06-16 | Discharge: 2014-06-16 | Disposition: A | Discharge status: Home / Self Care | Payer: BC Managed Care – PPO | Source: Ambulatory Visit | Attending: Sports Medicine | Admitting: Sports Medicine

## 2014-06-16 DIAGNOSIS — M7661 Achilles tendinitis, right leg: Secondary | ICD-10-CM

## 2014-06-22 ENCOUNTER — Encounter: Payer: Self-pay | Admitting: Certified Nurse Midwife

## 2014-12-16 ENCOUNTER — Other Ambulatory Visit: Payer: Self-pay | Admitting: Internal Medicine

## 2014-12-16 DIAGNOSIS — Z1231 Encounter for screening mammogram for malignant neoplasm of breast: Secondary | ICD-10-CM

## 2015-01-26 ENCOUNTER — Ambulatory Visit: Payer: Self-pay

## 2016-04-06 ENCOUNTER — Encounter (HOSPITAL_COMMUNITY): Payer: Self-pay | Admitting: Emergency Medicine

## 2016-04-06 ENCOUNTER — Emergency Department (HOSPITAL_COMMUNITY)
Admission: EM | Admit: 2016-04-06 | Discharge: 2016-04-06 | Disposition: A | Discharge status: Home / Self Care | Payer: BLUE CROSS/BLUE SHIELD | Attending: Emergency Medicine | Admitting: Emergency Medicine

## 2016-04-06 ENCOUNTER — Emergency Department (HOSPITAL_COMMUNITY): Payer: BLUE CROSS/BLUE SHIELD

## 2016-04-06 DIAGNOSIS — M25422 Effusion, left elbow: Secondary | ICD-10-CM

## 2016-04-06 DIAGNOSIS — J45909 Unspecified asthma, uncomplicated: Secondary | ICD-10-CM | POA: Insufficient documentation

## 2016-04-06 DIAGNOSIS — M25522 Pain in left elbow: Secondary | ICD-10-CM | POA: Diagnosis present

## 2016-04-06 HISTORY — DX: Obesity, unspecified: E66.9

## 2016-04-06 LAB — CBC WITH DIFFERENTIAL/PLATELET
BASOS ABS: 0 10*3/uL (ref 0.0–0.1)
BASOS PCT: 0 %
EOS ABS: 0.3 10*3/uL (ref 0.0–0.7)
Eosinophils Relative: 2 %
HCT: 40.2 % (ref 36.0–46.0)
Hemoglobin: 12.4 g/dL (ref 12.0–15.0)
Lymphocytes Relative: 22 %
Lymphs Abs: 2.4 10*3/uL (ref 0.7–4.0)
MCH: 29.3 pg (ref 26.0–34.0)
MCHC: 30.8 g/dL (ref 30.0–36.0)
MCV: 95 fL (ref 78.0–100.0)
Monocytes Absolute: 0.6 10*3/uL (ref 0.1–1.0)
Monocytes Relative: 6 %
Neutro Abs: 7.5 10*3/uL (ref 1.7–7.7)
Neutrophils Relative %: 70 %
Platelets: 261 10*3/uL (ref 150–400)
RBC: 4.23 MIL/uL (ref 3.87–5.11)
RDW: 13.8 % (ref 11.5–15.5)
WBC: 10.8 10*3/uL — AB (ref 4.0–10.5)

## 2016-04-06 LAB — BASIC METABOLIC PANEL
Anion gap: 6 (ref 5–15)
BUN: 12 mg/dL (ref 6–20)
CALCIUM: 9.9 mg/dL (ref 8.9–10.3)
CO2: 31 mmol/L (ref 22–32)
CREATININE: 0.66 mg/dL (ref 0.44–1.00)
Chloride: 102 mmol/L (ref 101–111)
GFR calc non Af Amer: >60 mL/min (ref >60)
Glucose, Bld: 110 mg/dL — ABNORMAL HIGH (ref 65–99)
Potassium: 5.1 mmol/L (ref 3.5–5.1)
SODIUM: 139 mmol/L (ref 135–145)

## 2016-04-06 LAB — C-REACTIVE PROTEIN: CRP: 1.9 mg/dL — AB (ref <1.0)

## 2016-04-06 LAB — SEDIMENTATION RATE: Sed Rate: 35 mm/hr — ABNORMAL HIGH (ref 0–22)

## 2016-04-06 MED ORDER — KETOROLAC TROMETHAMINE 30 MG/ML IJ SOLN
30.0000 mg | Freq: Once | INTRAMUSCULAR | Status: AC
  Administered 2016-04-06: 30 mg via INTRAVENOUS
  Filled 2016-04-06: qty 1

## 2016-04-06 MED ORDER — CLINDAMYCIN PHOSPHATE 600 MG/50ML IV SOLN
600.0000 mg | Freq: Once | INTRAVENOUS | Status: AC
  Administered 2016-04-06: 600 mg via INTRAVENOUS
  Filled 2016-04-06: qty 50

## 2016-04-06 MED ORDER — CLINDAMYCIN HCL 150 MG PO CAPS: 450.0000 mg | ORAL_CAPSULE | Freq: Four times a day (QID) | ORAL | 0 refills | Status: DC

## 2016-04-06 NOTE — ED Provider Notes (Signed)
[facility] DEPT Provider Note   CSN: [id number] Arrival date & time: 04/06/16  0636   History   Chief Complaint Chief Complaint  Patient presents with  . Cellulitis    HPI Nicole Hines is a 55 y.o. female.  HPI   Patient presenting with L elbow pain and erythema.   Patient reporting erythema of L elbow beginning about two weeks ago and worsening since. Has not been seen for this problem since onset. Reports worsening pain as well. Has not taken anything for pain. Has not had any antibiotics. Does not recall any trauma or skin breaks prior to onset of symptoms. Denies numbness, weakness, tingling of the affected extremity. Denies fevers, chills, nausea, vomiting. Endorses diaphoresis, but says this is normal for her. Has history of MRSA skin infection in pannus requiring surgical debridement.   Past Medical History:  Diagnosis Date  . Amenorrhea    irreg cycle  . Anxiety   . Arthritis   . Asthma   . MRSA infection   . Obesity     Patient Active Problem List   Diagnosis Date Noted  . MRSA (methicillin resistant Staphylococcus aureus) carrier 02/17/2014    Class: History of  . Morbid obesity ([place]) 01/08/2014  . Special screening for malignant neoplasms, colon 01/08/2014    Past Surgical History:  Procedure Laterality Date  . COLONOSCOPY WITH PROPOFOL N/A 01/08/2014   Procedure: COLONOSCOPY WITH PROPOFOL;  Surgeon: [name], MD;  Location: [facility];  Service: Endoscopy;  Laterality: N/A;  . DILATION AND CURETTAGE OF UTERUS  1990  . LAPAROSCOPIC CHOLECYSTECTOMY  1994  . mrsa     had to have area cut out. abdomen    OB History    Gravida Para Term Preterm AB Living   3 2 2   1 2    SAB TAB Ectopic Multiple Live Births   1       2     Home Medications    Prior to Admission medications   Medication Sig Start Date End Date Taking? Authorizing Provider  ALPRAZolam Duanne Moron) 0.5 MG tablet Take 1 mg by mouth at bedtime as needed for sleep. For anxiety    Yes Historical Provider, MD  Calcium Carbonate-Vitamin D (CALCIUM 600+D3 PO) Take 1 tablet by mouth 2 (two) times daily.   Yes Historical Provider, MD  citalopram (CELEXA) 40 MG tablet Take 40 mg by mouth every morning.    Yes Historical Provider, MD  fish oil-omega-3 fatty acids 1000 MG capsule Take 1 g by mouth 2 (two) times daily.   Yes Historical Provider, MD  Fluticasone-Salmeterol (ADVAIR) 250-50 MCG/DOSE AEPB Inhale 2 puffs into the lungs 2 (two) times daily.    Yes Historical Provider, MD  Glucosamine-Chondroit-Vit C-Mn (GLUCOSAMINE 1500 COMPLEX PO) Take 1 capsule by mouth 2 (two) times daily.   Yes Historical Provider, MD  meloxicam (MOBIC) 15 MG tablet Take 15 mg by mouth daily.   Yes Historical Provider, MD  methocarbamol (ROBAXIN) 750 MG tablet Take 750 mg by mouth at bedtime.   Yes Historical Provider, MD  montelukast (SINGULAIR) 10 MG tablet Take 10 mg by mouth every morning.  10/19/13  Yes Historical Provider, MD  Multiple Vitamin (MULTIVITAMIN WITH MINERALS) TABS tablet Take 1 tablet by mouth daily.   Yes Historical Provider, MD  oxyCODONE-acetaminophen (PERCOCET) 10-325 MG per tablet Take 1 tablet by mouth every 6 (six) hours as needed for pain.  10/25/13  Yes Historical Provider, MD  vitamin B-12 (CYANOCOBALAMIN) 1000 MCG  tablet Take 1,000 mcg by mouth daily.   Yes Historical Provider, MD  clindamycin (CLEOCIN) 150 MG capsule Take 3 capsules (450 mg total) by mouth every 6 (six) hours. 04/06/16   Verner Mould, MD   Family History Family History  Problem Relation Age of Onset  . Cancer Mother     pancreatic  . Diabetes Mother   . Hypertension Mother   . Heart attack Father   . Heart attack Maternal Grandmother   . Diabetes Brother   . Cancer Maternal Aunt     uterine  . Diabetes Maternal Aunt   . Colon cancer Neg Hx     Social History Social History  Substance Use Topics  . Smoking status: Never Smoker  . Smokeless tobacco: Never Used  . Alcohol use No    Allergies   Review of patient's allergies indicates no known allergies.  Review of Systems Review of Systems  Constitutional: Positive for diaphoresis. Negative for chills and fever.  Respiratory: Negative for shortness of breath.   Cardiovascular: Negative for chest pain.  Gastrointestinal: Negative for abdominal pain, nausea and vomiting.  Musculoskeletal:       Positive for L elbow and upper arm pain  Skin: Positive for color change (Erythema of L elbow and upper arm). Negative for wound.  Allergic/Immunologic: Negative for immunocompromised state.  Neurological: Negative for headaches.   Physical Exam Updated Vital Signs BP 127/72 (BP Location: Right Arm)   Pulse (!) 58   Temp 98.9 F (37.2 C) (Oral)   Resp 16   Ht 5' 4"  (1.626 m)   Wt (!) 146.5 kg   LMP 02/18/2014   SpO2 95%   BMI 55.44 kg/m   Physical Exam  Constitutional: She is oriented to person, place, and time.  Obese female lying in bed in NAD  HENT:  Head: Normocephalic and atraumatic.  Nose: Nose normal.  Mouth/Throat: Oropharynx is clear and moist. No oropharyngeal exudate.  Eyes: Conjunctivae and EOM are normal. Pupils are equal, round, and reactive to light. Right eye exhibits no discharge. Left eye exhibits no discharge. No scleral icterus.  Cardiovascular: Normal rate, regular rhythm and normal heart sounds.   No murmur heard. Pulmonary/Chest: Effort normal and breath sounds normal. No respiratory distress. She has no wheezes.  Abdominal: Soft. Bowel sounds are normal. She exhibits no distension. There is no tenderness.  Musculoskeletal:  Erythema and swelling of L elbow and upper arm. TTP. Elbow fluctuant, skin above elbow slightly indurated. Full ROM. Strength 5/5 bilateral upper extremities.   Neurological: She is alert and oriented to person, place, and time.  Skin: Skin is warm and dry.  Psychiatric: She has a normal mood and affect. Her behavior is normal.   ED Treatments / Results   Labs (all labs ordered are listed, but only abnormal results are displayed) Labs Reviewed  BASIC METABOLIC PANEL - Abnormal; Notable for the following:       Result Value   Glucose, Bld 110 (*)    All other components within normal limits  CBC WITH DIFFERENTIAL/PLATELET - Abnormal; Notable for the following:    WBC 10.8 (*)    All other components within normal limits  C-REACTIVE PROTEIN - Abnormal; Notable for the following:    CRP 1.9 (*)    All other components within normal limits  SEDIMENTATION RATE    EKG  EKG Interpretation None       Radiology Dg Elbow 2 Views Left  Result Date: 04/06/2016 CLINICAL DATA:  55 year old female with left elbow pain redness and swelling. Personal history of MRI assay infection. Initial encounter. EXAM: LEFT ELBOW - 2 VIEW COMPARISON:  None. FINDINGS: Bone mineralization is within normal limits for age. Mild degenerative spurring at the left elbow. No joint effusion identified. No acute osseous abnormality identified. No subcutaneous gas. IMPRESSION: No radiographic evidence of left elbow joint effusion or acute osseous abnormality. Electronically Signed   By: Genevie Ann M.D.   On: 04/06/2016 09:28    Procedures Procedures (including critical care time)  Medications Ordered in ED Medications  clindamycin (CLEOCIN) IVPB 600 mg (not administered)  ketorolac (TORADOL) 30 MG/ML injection 30 mg (not administered)     Initial Impression / Assessment and Plan / ED Course  I have reviewed the triage vital signs and the nursing notes.  Pertinent labs & imaging results that were available during my care of the patient were reviewed by me and considered in my medical decision making (see chart for details).  Clinical Course   0900 Concern for septic bursitis. Will obtain L elbow plain film and consult ortho regarding possible aspiration.   1000 Per ortho (Dr. Percell Miller), aspiration or drainage not indicated at this time as patient has no obvious  sign of abscess. Recommends ice, warm compresses, elevation, and antibiotics. Will see in his office on 8/21 at 0830.   Final Clinical Impressions(s) / ED Diagnoses   Final diagnoses:  Pain and swelling of left elbow   Patient presenting with elbow pain and redness. Concern for septic bursitis vs cellulitis. Patient with full ROM, so septic joint less likely. Elbow xray with no signs of effusion or acute bony abnormalities. Per discussion with ortho, patient stable for discharge with PO antibiotics and f/u Monday morning in Dr. Debroah Loop office. One dose of IV clindamycin given in ED. Will d/c with 9 additional days of clindamycin and instructions to keep elbow elevated and apply warm compresses.   New Prescriptions New Prescriptions   CLINDAMYCIN (CLEOCIN) 150 MG CAPSULE    Take 3 capsules (450 mg total) by mouth every 6 (six) hours.     Verner Mould, MD 04/06/16 [place], MD 04/06/16 1158

## 2016-04-06 NOTE — ED Triage Notes (Signed)
Pt. reports worsening left elbow skin redness/swelling onset 2 weeks ago with no drainage . Denies injury /no fever or chills.

## 2016-04-06 NOTE — ED Notes (Signed)
Phlebotomy at the bedside. Pt returned from [place]

## 2016-04-06 NOTE — Discharge Instructions (Signed)
Please begin taking clindamycin (antibiotic) every six hours beginning at 6 o'clock tonight. Continue to take this for 10 days (last dose August 26).   Please keep your elbow elevated when possible. You can try warm compresses or ice packs to help with your pain, as well as ibuprofen or Tylenol.   It is also important to go to your appointment at Dr. Debroah Loop office on Monday (August 21) at 8:30 AM. His office information is below:  Dr. Edmonia Lynch, [facility] [address] [place], [place] [address] Phone: [phone]  If you develop fevers or chills, or cannot move your L arm, please come back to the emergency room.

## 2016-04-06 NOTE — ED Provider Notes (Signed)
I saw and evaluated the patient, reviewed the resident's note and I agree with the findings and plan. If applicable, I agree with the resident's interpretation of the EKG.  If applicable, I was present for critical portions of any procedures performed.  Redness and swelling to left elbow 2 weeks. Denies injury. Denies fever or vomiting.  Erythema and edema of left bursa on exam with mild surrounding erythema, full range of motion of elbow joint. Intact radial pulse.  Suspect olecranon bursitis, possibly septic. Doubt septic joint.  D/w Dr. Percell Miller of ortho who does not recommend aspiration or incision as there is no head. Recommends RICE, antibiotics and followup in his office on Monday. Patient given IV clindamycin in the emergency Department will follow-up with orthopedics next week. Return precautions discussed.   Ezequiel Essex, MD 04/06/16 1159

## 2020-12-18 ENCOUNTER — Emergency Department (HOSPITAL_BASED_OUTPATIENT_CLINIC_OR_DEPARTMENT_OTHER): Payer: PRIVATE HEALTH INSURANCE

## 2020-12-18 ENCOUNTER — Encounter (HOSPITAL_BASED_OUTPATIENT_CLINIC_OR_DEPARTMENT_OTHER): Payer: Self-pay | Admitting: Emergency Medicine

## 2020-12-18 ENCOUNTER — Other Ambulatory Visit: Payer: Self-pay

## 2020-12-18 ENCOUNTER — Inpatient Hospital Stay (HOSPITAL_BASED_OUTPATIENT_CLINIC_OR_DEPARTMENT_OTHER)
Admission: EM | Admit: 2020-12-18 | Discharge: 2020-12-30 | DRG: 548 | Disposition: A | Discharge status: Home Health | Payer: PRIVATE HEALTH INSURANCE | Attending: Internal Medicine | Admitting: Internal Medicine

## 2020-12-18 DIAGNOSIS — J45909 Unspecified asthma, uncomplicated: Secondary | ICD-10-CM | POA: Diagnosis present

## 2020-12-18 DIAGNOSIS — K6812 Psoas muscle abscess: Secondary | ICD-10-CM | POA: Diagnosis present

## 2020-12-18 DIAGNOSIS — I10 Essential (primary) hypertension: Secondary | ICD-10-CM | POA: Diagnosis not present

## 2020-12-18 DIAGNOSIS — E876 Hypokalemia: Secondary | ICD-10-CM | POA: Diagnosis not present

## 2020-12-18 DIAGNOSIS — Z9049 Acquired absence of other specified parts of digestive tract: Secondary | ICD-10-CM

## 2020-12-18 DIAGNOSIS — B9562 Methicillin resistant Staphylococcus aureus infection as the cause of diseases classified elsewhere: Secondary | ICD-10-CM | POA: Diagnosis present

## 2020-12-18 DIAGNOSIS — R7881 Bacteremia: Secondary | ICD-10-CM | POA: Diagnosis present

## 2020-12-18 DIAGNOSIS — I350 Nonrheumatic aortic (valve) stenosis: Secondary | ICD-10-CM | POA: Diagnosis not present

## 2020-12-18 DIAGNOSIS — M5186 Other intervertebral disc disorders, lumbar region: Secondary | ICD-10-CM | POA: Diagnosis not present

## 2020-12-18 DIAGNOSIS — M5136 Other intervertebral disc degeneration, lumbar region: Secondary | ICD-10-CM | POA: Diagnosis not present

## 2020-12-18 DIAGNOSIS — Z8614 Personal history of Methicillin resistant Staphylococcus aureus infection: Secondary | ICD-10-CM | POA: Diagnosis not present

## 2020-12-18 DIAGNOSIS — G8929 Other chronic pain: Secondary | ICD-10-CM

## 2020-12-18 DIAGNOSIS — M48061 Spinal stenosis, lumbar region without neurogenic claudication: Secondary | ICD-10-CM | POA: Diagnosis not present

## 2020-12-18 DIAGNOSIS — R509 Fever, unspecified: Secondary | ICD-10-CM

## 2020-12-18 DIAGNOSIS — I330 Acute and subacute infective endocarditis: Secondary | ICD-10-CM

## 2020-12-18 DIAGNOSIS — G894 Chronic pain syndrome: Secondary | ICD-10-CM | POA: Diagnosis not present

## 2020-12-18 DIAGNOSIS — M25559 Pain in unspecified hip: Secondary | ICD-10-CM | POA: Diagnosis present

## 2020-12-18 DIAGNOSIS — Z7951 Long term (current) use of inhaled steroids: Secondary | ICD-10-CM | POA: Diagnosis not present

## 2020-12-18 DIAGNOSIS — Z22322 Carrier or suspected carrier of Methicillin resistant Staphylococcus aureus: Secondary | ICD-10-CM | POA: Diagnosis not present

## 2020-12-18 DIAGNOSIS — M25569 Pain in unspecified knee: Secondary | ICD-10-CM | POA: Diagnosis present

## 2020-12-18 DIAGNOSIS — M00052 Staphylococcal arthritis, left hip: Principal | ICD-10-CM | POA: Diagnosis present

## 2020-12-18 DIAGNOSIS — R7982 Elevated C-reactive protein (CRP): Secondary | ICD-10-CM | POA: Diagnosis not present

## 2020-12-18 DIAGNOSIS — F419 Anxiety disorder, unspecified: Secondary | ICD-10-CM | POA: Diagnosis present

## 2020-12-18 DIAGNOSIS — R5381 Other malaise: Secondary | ICD-10-CM | POA: Diagnosis present

## 2020-12-18 DIAGNOSIS — Z79899 Other long term (current) drug therapy: Secondary | ICD-10-CM

## 2020-12-18 DIAGNOSIS — M25552 Pain in left hip: Secondary | ICD-10-CM

## 2020-12-18 DIAGNOSIS — R1032 Left lower quadrant pain: Secondary | ICD-10-CM

## 2020-12-18 DIAGNOSIS — M009 Pyogenic arthritis, unspecified: Secondary | ICD-10-CM | POA: Diagnosis present

## 2020-12-18 DIAGNOSIS — Z791 Long term (current) use of non-steroidal anti-inflammatories (NSAID): Secondary | ICD-10-CM

## 2020-12-18 DIAGNOSIS — Z6838 Body mass index (BMI) 38.0-38.9, adult: Secondary | ICD-10-CM | POA: Diagnosis not present

## 2020-12-18 DIAGNOSIS — Z8249 Family history of ischemic heart disease and other diseases of the circulatory system: Secondary | ICD-10-CM

## 2020-12-18 DIAGNOSIS — Z833 Family history of diabetes mellitus: Secondary | ICD-10-CM

## 2020-12-18 DIAGNOSIS — R6510 Systemic inflammatory response syndrome (SIRS) of non-infectious origin without acute organ dysfunction: Secondary | ICD-10-CM | POA: Diagnosis present

## 2020-12-18 DIAGNOSIS — Z20822 Contact with and (suspected) exposure to covid-19: Secondary | ICD-10-CM | POA: Diagnosis not present

## 2020-12-18 LAB — CBC WITH DIFFERENTIAL/PLATELET
Abs Immature Granulocytes: 0.09 10*3/uL — ABNORMAL HIGH (ref 0.00–0.07)
Basophils Absolute: 0 10*3/uL (ref 0.0–0.1)
Basophils Relative: 0 %
Eosinophils Absolute: 0 10*3/uL (ref 0.0–0.5)
Eosinophils Relative: 0 %
HCT: 37.9 % (ref 36.0–46.0)
Hemoglobin: 12.5 g/dL (ref 12.0–15.0)
Immature Granulocytes: 1 %
Lymphocytes Relative: 4 %
Lymphs Abs: 0.6 10*3/uL — ABNORMAL LOW (ref 0.7–4.0)
MCH: 30.6 pg (ref 26.0–34.0)
MCHC: 33 g/dL (ref 30.0–36.0)
MCV: 92.7 fL (ref 80.0–100.0)
Monocytes Absolute: 1.6 10*3/uL — ABNORMAL HIGH (ref 0.1–1.0)
Monocytes Relative: 10 %
Neutro Abs: 13.6 10*3/uL — ABNORMAL HIGH (ref 1.7–7.7)
Neutrophils Relative %: 85 %
Platelets: 205 10*3/uL (ref 150–400)
RBC: 4.09 MIL/uL (ref 3.87–5.11)
RDW: 12.8 % (ref 11.5–15.5)
WBC: 16 10*3/uL — ABNORMAL HIGH (ref 4.0–10.5)
nRBC: 0 % (ref 0.0–0.2)

## 2020-12-18 LAB — COMPREHENSIVE METABOLIC PANEL
ALT: 16 U/L (ref 0–44)
AST: 15 U/L (ref 15–41)
Albumin: 4.2 g/dL (ref 3.5–5.0)
Alkaline Phosphatase: 63 U/L (ref 38–126)
Anion gap: 9 (ref 5–15)
BUN: 15 mg/dL (ref 6–20)
CO2: 27 mmol/L (ref 22–32)
Calcium: 9.7 mg/dL (ref 8.9–10.3)
Chloride: 101 mmol/L (ref 98–111)
Creatinine, Ser: 0.54 mg/dL (ref 0.44–1.00)
GFR, Estimated: >60 mL/min (ref >60)
Glucose, Bld: 158 mg/dL — ABNORMAL HIGH (ref 70–99)
Potassium: 3.1 mmol/L — ABNORMAL LOW (ref 3.5–5.1)
Sodium: 137 mmol/L (ref 135–145)
Total Bilirubin: 0.9 mg/dL (ref 0.3–1.2)
Total Protein: 7.6 g/dL (ref 6.5–8.1)

## 2020-12-18 LAB — URINALYSIS, ROUTINE W REFLEX MICROSCOPIC
Bilirubin Urine: NEGATIVE
Glucose, UA: NEGATIVE mg/dL
Hgb urine dipstick: NEGATIVE
Ketones, ur: 15 mg/dL — AB
Leukocytes,Ua: NEGATIVE
Nitrite: NEGATIVE
Protein, ur: 30 mg/dL — AB
Specific Gravity, Urine: >1.046 — ABNORMAL HIGH (ref 1.005–1.030)
pH: 6.5 (ref 5.0–8.0)

## 2020-12-18 LAB — LACTIC ACID, PLASMA: Lactic Acid, Venous: 0.6 mmol/L (ref 0.5–1.9)

## 2020-12-18 LAB — RESP PANEL BY RT-PCR (FLU A&B, COVID) ARPGX2
Influenza A by PCR: NEGATIVE
Influenza B by PCR: NEGATIVE
SARS Coronavirus 2 by RT PCR: NEGATIVE

## 2020-12-18 MED ORDER — SODIUM CHLORIDE 0.9 % IV BOLUS
500.0000 mL | Freq: Once | INTRAVENOUS | Status: AC
  Administered 2020-12-18: 500 mL via INTRAVENOUS

## 2020-12-18 MED ORDER — IOHEXOL 300 MG/ML  SOLN
100.0000 mL | Freq: Once | INTRAMUSCULAR | Status: AC | PRN
  Administered 2020-12-18: 100 mL via INTRAVENOUS

## 2020-12-18 MED ORDER — HYDROMORPHONE HCL 1 MG/ML IJ SOLN
1.0000 mg | Freq: Once | INTRAMUSCULAR | Status: AC
  Administered 2020-12-18: 1 mg via INTRAVENOUS
  Filled 2020-12-18: qty 1

## 2020-12-18 MED ORDER — ACETAMINOPHEN 500 MG PO TABS
1000.0000 mg | ORAL_TABLET | Freq: Once | ORAL | Status: AC
  Administered 2020-12-18: 1000 mg via ORAL
  Filled 2020-12-18: qty 2

## 2020-12-18 MED ORDER — ONDANSETRON HCL 4 MG/2ML IJ SOLN
4.0000 mg | Freq: Once | INTRAMUSCULAR | Status: AC
  Administered 2020-12-18: 4 mg via INTRAVENOUS
  Filled 2020-12-18: qty 2

## 2020-12-18 MED ORDER — POTASSIUM CHLORIDE CRYS ER 20 MEQ PO TBCR
40.0000 meq | EXTENDED_RELEASE_TABLET | Freq: Once | ORAL | Status: AC
  Administered 2020-12-18: 40 meq via ORAL
  Filled 2020-12-18: qty 2

## 2020-12-18 NOTE — ED Notes (Signed)
Patient transported to X-ray 

## 2020-12-18 NOTE — ED Notes (Signed)
Patient transported to CT 

## 2020-12-18 NOTE — ED Notes (Signed)
SpO2 currently 98% on Room Air, 2L on SB in room.

## 2020-12-18 NOTE — ED Notes (Signed)
Pt stated that her Pain is slowly going down and that she feels much better. Pt has sleep apnea and is at 88% but will go back up to 94%. RT notified and pt placed on 2 L

## 2020-12-18 NOTE — ED Provider Notes (Signed)
  Physical Exam  BP (!) 111/58 (BP Location: Left Arm)   Pulse 79   Temp (!) 102.2 F (39 C) (Oral)   Resp 20   Ht 5' 8"  (1.727 m)   Wt 113.4 kg   LMP 02/18/2014   SpO2 100%   BMI 38.01 kg/m   Physical Exam  ED Course/Procedures     Procedures  MDM  Patient presents with hip pain abdominal pain.  Requiring repeat doses of pain medicine.  Is on some chronic pain medicines home.  However developed a fever.  No clear cause.  Previously had some nausea and vomiting.  CT scan done did not show clear intra-abdominal pathology.  Urine does not show infection.  Chest x-ray clear.  Left hip not irritable so infected joint less likely.  However patient requiring pain medicines cannot ambulate at this time.  With fever and the abdominal pain I feels the patient benefit from admission to the hospital.  I have now added on lactic acid and blood cultures.  Has had some mild desaturation after pain medicine.  Will discuss with hospitalist       Davonna Belling, MD 12/18/20 2147

## 2020-12-18 NOTE — ED Notes (Signed)
Dr. Alvino Chapel notified of temp

## 2020-12-18 NOTE — ED Notes (Signed)
Back from CT

## 2020-12-18 NOTE — ED Notes (Signed)
Pt states that why she is still her pain is fine but when she moves around her pain gets worse. She states that her pain level is higher from CT but states she thinks it will go down after lying still for a while

## 2020-12-18 NOTE — ED Triage Notes (Signed)
Pt from home and arrived via EMS related to intermitent Hip Pain. Pt states that her left hip has been hurting since wednesday  and gotten continually worse.

## 2020-12-18 NOTE — ED Provider Notes (Signed)
[facility] EMERGENC[facility] DEPT Provider Note   CSN: [id number] Arrival date & time: 12/18/20  1023     History Chief Complaint  Patient presents with  . Hip Pain    Nicole Hines is a 60 y.o. female.  Presented to ER with concern for left hip pain.  Patient reports that hip has been bothering her since Wednesday.  States that it got to the point where she is having a hard time walking and has been lying in her bed mostly.  Has tried her previously prescribed Percocet with minimal relief.  Pain is improved with rest, worse with movement.  Currently 10 out of 10.  No associated fevers.  Has had some nausea and occasional episode of vomiting.  No known falls.  HPI     Past Medical History:  Diagnosis Date  . Amenorrhea    irreg cycle  . Anxiety   . Arthritis   . Asthma   . MRSA infection   . Obesity     Patient Active Problem List   Diagnosis Date Noted  . MRSA (methicillin resistant Staphylococcus aureus) carrier 02/17/2014    Class: History of  . Morbid obesity ([place]) 01/08/2014  . Special screening for malignant neoplasms, colon 01/08/2014    Past Surgical History:  Procedure Laterality Date  . COLONOSCOPY WITH PROPOFOL N/A 01/08/2014   Procedure: COLONOSCOPY WITH PROPOFOL;  Surgeon: [name], MD;  Location: [facility];  Service: Endoscopy;  Laterality: N/A;  . DILATION AND CURETTAGE OF UTERUS  1990  . LAPAROSCOPIC CHOLECYSTECTOMY  1994  . mrsa     had to have area cut out. abdomen     OB History    Gravida  3   Para  2   Term  2   Preterm      AB  1   Living  2     SAB  1   IAB      Ectopic      Multiple      Live Births  2           Family History  Problem Relation Age of Onset  . Cancer Mother        pancreatic  . Diabetes Mother   . Hypertension Mother   . Heart attack Father   . Heart attack Maternal Grandmother   . Diabetes Brother   . Cancer Maternal Aunt        uterine  . Diabetes Maternal Aunt   .  Colon cancer Neg Hx     Social History   Tobacco Use  . Smoking status: Never Smoker  . Smokeless tobacco: Never Used  Substance Use Topics  . Alcohol use: No  . Drug use: No    Home Medications Prior to Admission medications   Medication Sig Start Date End Date Taking? Authorizing Provider  ALPRAZolam Duanne Moron) 0.5 MG tablet Take 1 mg by mouth at bedtime as needed for sleep. For anxiety   Yes [provider]  Calcium Carbonate-Vitamin D (CALCIUM 600+D3 PO) Take 1 tablet by mouth 2 (two) times daily.   Yes [provider]  fish oil-omega-3 fatty acids 1000 MG capsule Take 1 g by mouth 2 (two) times daily.   Yes [provider]  meloxicam (MOBIC) 15 MG tablet Take 15 mg by mouth daily.   Yes [provider]  methocarbamol (ROBAXIN) 750 MG tablet Take 750 mg by mouth at bedtime.   Yes [provider]  citalopram (  CELEXA) 40 MG tablet Take 40 mg by mouth every morning.    [provider]  clindamycin (CLEOCIN) 150 MG capsule Take 3 capsules (450 mg total) by mouth every 6 (six) hours. 04/06/16   Verner Mould, MD  Fluticasone-Salmeterol (ADVAIR) 250-50 MCG/DOSE AEPB Inhale 2 puffs into the lungs 2 (two) times daily.     [provider]  Glucosamine-Chondroit-Vit C-Mn (GLUCOSAMINE 1500 COMPLEX PO) Take 1 capsule by mouth 2 (two) times daily.    [provider]  montelukast (SINGULAIR) 10 MG tablet Take 10 mg by mouth every morning.  10/19/13   [provider]  Multiple Vitamin (MULTIVITAMIN WITH MINERALS) TABS tablet Take 1 tablet by mouth daily.    [provider]  oxyCODONE-acetaminophen (PERCOCET) 10-325 MG per tablet Take 1 tablet by mouth every 6 (six) hours as needed for pain.  10/25/13   [provider]  vitamin B-12 (CYANOCOBALAMIN) 1000 MCG tablet Take 1,000 mcg by mouth daily.    [provider]    Allergies    Patient has no known allergies.  Review of Systems    Review of Systems  Constitutional: Negative for chills and fever.  HENT: Negative for ear pain and sore throat.   Eyes: Negative for pain and visual disturbance.  Respiratory: Negative for cough and shortness of breath.   Cardiovascular: Negative for chest pain and palpitations.  Gastrointestinal: Positive for nausea and vomiting. Negative for abdominal pain.  Genitourinary: Negative for dysuria and hematuria.  Musculoskeletal: Positive for arthralgias and myalgias. Negative for back pain.  Skin: Negative for color change and rash.  Neurological: Negative for seizures and syncope.  All other systems reviewed and are negative.   Physical Exam Updated Vital Signs BP 128/60   Pulse 78   Temp 98.5 F (36.9 C) (Oral)   Resp 18   Ht 5' 8"  (1.727 m)   Wt 113.4 kg   LMP 02/18/2014   SpO2 95%   BMI 38.01 kg/m   Physical Exam Vitals and nursing note reviewed.  Constitutional:      General: She is not in acute distress.    Appearance: She is well-developed.  HENT:     Head: Normocephalic and atraumatic.  Eyes:     Conjunctiva/sclera: Conjunctivae normal.  Cardiovascular:     Rate and Rhythm: Normal rate.     Pulses: Normal pulses.  Pulmonary:     Effort: Pulmonary effort is normal. No respiratory distress.  Abdominal:     Palpations: Abdomen is soft.     Tenderness: There is abdominal tenderness.     Comments: Tenderness to palpation over the left lower quadrant  Musculoskeletal:     Cervical back: Neck supple.     Comments: Tenderness to palpation noted over left hip, throughout left thigh, patient has mild reduction in range of motion due to pain but is able to flex, extend abduct and abduct hip, distal sensation and DP/PT pulses are intact  Skin:    General: Skin is warm and dry.  Neurological:     General: No focal deficit present.     Mental Status: She is alert.  Psychiatric:        Mood and Affect: Mood normal.     ED Results / Procedures / Treatments    Labs (all labs ordered are listed, but only abnormal results are displayed) Labs Reviewed  CBC WITH DIFFERENTIAL/PLATELET - Abnormal; Notable for the following components:      Result Value   WBC 16.0 (*)  Neutro Abs 13.6 (*)    Lymphs Abs 0.6 (*)    Monocytes Absolute 1.6 (*)    Abs Immature Granulocytes 0.09 (*)    All other components within normal limits  COMPREHENSIVE METABOLIC PANEL - Abnormal; Notable for the following components:   Potassium 3.1 (*)    Glucose, Bld 158 (*)    All other components within normal limits  URINALYSIS, ROUTINE W REFLEX MICROSCOPIC    EKG None  Radiology US Venous Img Lower  Left (DVT Study)  Result Date: 12/18/2020 CLINICAL DATA:  60 year old with left hip and buttock pain. EXAM: LEFT LOWER EXTREMITY VENOUS DOPPLER ULTRASOUND TECHNIQUE: Gray-scale sonography with graded compression, as well as color Doppler and duplex ultrasound were performed to evaluate the lower extremity deep venous systems from the level of the common femoral vein and including the common femoral, femoral, profunda femoral, popliteal and calf veins including the posterior tibial, peroneal and gastrocnemius veins when visible. The superficial great saphenous vein was also interrogated. Spectral Doppler was utilized to evaluate flow at rest and with distal augmentation maneuvers in the common femoral, femoral and popliteal veins. COMPARISON:  None. FINDINGS: Contralateral Common Femoral Vein: Respiratory phasicity is normal and symmetric with the symptomatic side. No evidence of thrombus. Normal compressibility. Common Femoral Vein: No evidence of thrombus. Normal compressibility, respiratory phasicity and response to augmentation. Saphenofemoral Junction: No evidence of thrombus. Normal compressibility and flow on color Doppler imaging. Profunda Femoral Vein: No evidence of thrombus. Normal compressibility and flow on color Doppler imaging. Femoral Vein: No evidence of thrombus.  Normal compressibility, respiratory phasicity and response to augmentation. Popliteal Vein: No evidence of thrombus. Normal compressibility, respiratory phasicity and response to augmentation. Calf Veins: No evidence of thrombus. Normal compressibility and flow on color Doppler imaging. Superficial Great Saphenous Vein: No evidence of thrombus. Normal compressibility. Other Findings:  None. IMPRESSION: Negative for deep venous thrombosis in left lower extremity. Electronically Signed   By: Markus Daft M.D.   On: 12/18/2020 11:49   DG Hip Unilat W or Wo Pelvis 2-3 Views Left  Result Date: 12/18/2020 CLINICAL DATA:  LEFT hip pain, no known injury. EXAM: DG HIP (WITH OR WITHOUT PELVIS) 2-3V LEFT COMPARISON:  Plain film of the pelvis dated 09/26/2006 FINDINGS: Single-view of the pelvis and two views of the LEFT hip are provided. Osseous alignment appears normal. No fracture line or displaced fracture fragment is seen. Mild degenerative spurring at the bilateral hip joints. IMPRESSION: 1. No acute findings. 2. Mild degenerative change. Electronically Signed   By: Franki Cabot M.D.   On: 12/18/2020 11:52    Procedures Procedures   Medications Ordered in ED Medications  iohexol (OMNIPAQUE) 300 MG/ML solution 100 mL (has no administration in time range)  ondansetron (ZOFRAN) injection 4 mg (4 mg Intravenous Given 12/18/20 1102)  HYDROmorphone (DILAUDID) injection 1 mg (1 mg Intravenous Given 12/18/20 1104)  HYDROmorphone (DILAUDID) injection 1 mg (1 mg Intravenous Given 12/18/20 1437)    ED Course  I have reviewed the triage vital signs and the nursing notes.  Pertinent labs & imaging results that were available during my care of the patient were reviewed by me and considered in my medical decision making (see chart for details).    MDM Rules/Calculators/A&P                          60 year old lady presents to ER with concern for left hip pain.  On exam patient well-appearing in no  distress.   Neurovascular intact.  Did note to have significant tenderness over the left hip as well as her left lower quadrant of her abdomen.  Basic labs were noted for leukocytosis.  The x-rays were negative for acute pathology.  Ordered CT scan to evaluate for occult hip fracture, joint effusion as well as evaluation of her left lower quadrant abdominal pain, eval for possible diverticulitis.  Due to the dry bridge CT scan not functioning, patient will be transferred ER to ER to obtain additional imaging.  Case discussed with Dr. Langston Masker who [facility] patient to [facility].  Patient will go via [facility].  Final Clinical Impression(s) / ED Diagnoses Final diagnoses:  Hip pain  Left hip pain  LLQ abdominal pain    Rx / DC Orders ED Discharge Orders    None       Lucrezia Starch, MD 12/18/20 1517

## 2020-12-19 ENCOUNTER — Observation Stay (HOSPITAL_COMMUNITY): Payer: PRIVATE HEALTH INSURANCE

## 2020-12-19 ENCOUNTER — Encounter (HOSPITAL_COMMUNITY): Payer: Self-pay | Admitting: Internal Medicine

## 2020-12-19 DIAGNOSIS — M009 Pyogenic arthritis, unspecified: Secondary | ICD-10-CM | POA: Diagnosis present

## 2020-12-19 DIAGNOSIS — E876 Hypokalemia: Secondary | ICD-10-CM | POA: Diagnosis not present

## 2020-12-19 DIAGNOSIS — Z7951 Long term (current) use of inhaled steroids: Secondary | ICD-10-CM | POA: Diagnosis not present

## 2020-12-19 DIAGNOSIS — Z833 Family history of diabetes mellitus: Secondary | ICD-10-CM | POA: Diagnosis not present

## 2020-12-19 DIAGNOSIS — F419 Anxiety disorder, unspecified: Secondary | ICD-10-CM | POA: Diagnosis present

## 2020-12-19 DIAGNOSIS — I330 Acute and subacute infective endocarditis: Secondary | ICD-10-CM | POA: Diagnosis not present

## 2020-12-19 DIAGNOSIS — R7881 Bacteremia: Secondary | ICD-10-CM | POA: Diagnosis present

## 2020-12-19 DIAGNOSIS — M48061 Spinal stenosis, lumbar region without neurogenic claudication: Secondary | ICD-10-CM | POA: Diagnosis present

## 2020-12-19 DIAGNOSIS — J45909 Unspecified asthma, uncomplicated: Secondary | ICD-10-CM | POA: Diagnosis present

## 2020-12-19 DIAGNOSIS — M25559 Pain in unspecified hip: Secondary | ICD-10-CM

## 2020-12-19 DIAGNOSIS — G894 Chronic pain syndrome: Secondary | ICD-10-CM | POA: Diagnosis present

## 2020-12-19 DIAGNOSIS — M25552 Pain in left hip: Secondary | ICD-10-CM | POA: Diagnosis not present

## 2020-12-19 DIAGNOSIS — M00052 Staphylococcal arthritis, left hip: Secondary | ICD-10-CM | POA: Diagnosis present

## 2020-12-19 DIAGNOSIS — K6812 Psoas muscle abscess: Secondary | ICD-10-CM | POA: Diagnosis present

## 2020-12-19 DIAGNOSIS — Z22322 Carrier or suspected carrier of Methicillin resistant Staphylococcus aureus: Secondary | ICD-10-CM | POA: Diagnosis not present

## 2020-12-19 DIAGNOSIS — M5186 Other intervertebral disc disorders, lumbar region: Secondary | ICD-10-CM | POA: Diagnosis present

## 2020-12-19 DIAGNOSIS — M25569 Pain in unspecified knee: Secondary | ICD-10-CM | POA: Diagnosis present

## 2020-12-19 DIAGNOSIS — Z20822 Contact with and (suspected) exposure to covid-19: Secondary | ICD-10-CM | POA: Diagnosis present

## 2020-12-19 DIAGNOSIS — M533 Sacrococcygeal disorders, not elsewhere classified: Secondary | ICD-10-CM | POA: Diagnosis not present

## 2020-12-19 DIAGNOSIS — Z8614 Personal history of Methicillin resistant Staphylococcus aureus infection: Secondary | ICD-10-CM | POA: Diagnosis not present

## 2020-12-19 DIAGNOSIS — R7982 Elevated C-reactive protein (CRP): Secondary | ICD-10-CM | POA: Diagnosis present

## 2020-12-19 DIAGNOSIS — R6510 Systemic inflammatory response syndrome (SIRS) of non-infectious origin without acute organ dysfunction: Secondary | ICD-10-CM

## 2020-12-19 DIAGNOSIS — Z6838 Body mass index (BMI) 38.0-38.9, adult: Secondary | ICD-10-CM | POA: Diagnosis not present

## 2020-12-19 DIAGNOSIS — Z9049 Acquired absence of other specified parts of digestive tract: Secondary | ICD-10-CM | POA: Diagnosis not present

## 2020-12-19 DIAGNOSIS — I10 Essential (primary) hypertension: Secondary | ICD-10-CM | POA: Diagnosis present

## 2020-12-19 DIAGNOSIS — I350 Nonrheumatic aortic (valve) stenosis: Secondary | ICD-10-CM | POA: Diagnosis present

## 2020-12-19 DIAGNOSIS — B9562 Methicillin resistant Staphylococcus aureus infection as the cause of diseases classified elsewhere: Secondary | ICD-10-CM | POA: Diagnosis present

## 2020-12-19 DIAGNOSIS — R5381 Other malaise: Secondary | ICD-10-CM | POA: Diagnosis present

## 2020-12-19 DIAGNOSIS — M5136 Other intervertebral disc degeneration, lumbar region: Secondary | ICD-10-CM | POA: Diagnosis present

## 2020-12-19 DIAGNOSIS — R509 Fever, unspecified: Secondary | ICD-10-CM | POA: Diagnosis not present

## 2020-12-19 LAB — HIV ANTIBODY (ROUTINE TESTING W REFLEX): HIV Screen 4th Generation wRfx: NONREACTIVE

## 2020-12-19 LAB — COMPREHENSIVE METABOLIC PANEL
ALT: 25 U/L (ref 0–44)
AST: 25 U/L (ref 15–41)
Albumin: 3.4 g/dL — ABNORMAL LOW (ref 3.5–5.0)
Alkaline Phosphatase: 69 U/L (ref 38–126)
Anion gap: 9 (ref 5–15)
BUN: 17 mg/dL (ref 6–20)
CO2: 26 mmol/L (ref 22–32)
Calcium: 9.7 mg/dL (ref 8.9–10.3)
Chloride: 104 mmol/L (ref 98–111)
Creatinine, Ser: 0.59 mg/dL (ref 0.44–1.00)
GFR, Estimated: >60 mL/min (ref >60)
Glucose, Bld: 185 mg/dL — ABNORMAL HIGH (ref 70–99)
Potassium: 4 mmol/L (ref 3.5–5.1)
Sodium: 139 mmol/L (ref 135–145)
Total Bilirubin: 0.8 mg/dL (ref 0.3–1.2)
Total Protein: 8 g/dL (ref 6.5–8.1)

## 2020-12-19 LAB — CBC WITH DIFFERENTIAL/PLATELET
Abs Immature Granulocytes: 0.29 10*3/uL — ABNORMAL HIGH (ref 0.00–0.07)
Basophils Absolute: 0 10*3/uL (ref 0.0–0.1)
Basophils Relative: 0 %
Eosinophils Absolute: 0 10*3/uL (ref 0.0–0.5)
Eosinophils Relative: 0 %
HCT: 36.7 % (ref 36.0–46.0)
Hemoglobin: 12.1 g/dL (ref 12.0–15.0)
Immature Granulocytes: 2 %
Lymphocytes Relative: 3 %
Lymphs Abs: 0.4 10*3/uL — ABNORMAL LOW (ref 0.7–4.0)
MCH: 30.9 pg (ref 26.0–34.0)
MCHC: 33 g/dL (ref 30.0–36.0)
MCV: 93.9 fL (ref 80.0–100.0)
Monocytes Absolute: 1.3 10*3/uL — ABNORMAL HIGH (ref 0.1–1.0)
Monocytes Relative: 8 %
Neutro Abs: 13.5 10*3/uL — ABNORMAL HIGH (ref 1.7–7.7)
Neutrophils Relative %: 87 %
Platelets: 191 10*3/uL (ref 150–400)
RBC: 3.91 MIL/uL (ref 3.87–5.11)
RDW: 12.9 % (ref 11.5–15.5)
WBC: 15.5 10*3/uL — ABNORMAL HIGH (ref 4.0–10.5)
nRBC: 0 % (ref 0.0–0.2)

## 2020-12-19 MED ORDER — HYDROMORPHONE HCL 1 MG/ML IJ SOLN
0.5000 mg | Freq: Once | INTRAMUSCULAR | Status: AC
  Administered 2020-12-19: 0.5 mg via INTRAVENOUS

## 2020-12-19 MED ORDER — ACETAMINOPHEN 325 MG PO TABS
650.0000 mg | ORAL_TABLET | Freq: Four times a day (QID) | ORAL | Status: DC | PRN
  Administered 2020-12-20 – 2020-12-27 (×8): 650 mg via ORAL
  Filled 2020-12-19 (×8): qty 2

## 2020-12-19 MED ORDER — ACETAMINOPHEN 650 MG RE SUPP
650.0000 mg | Freq: Four times a day (QID) | RECTAL | Status: DC | PRN
  Administered 2020-12-19 – 2020-12-20 (×2): 650 mg via RECTAL
  Filled 2020-12-19 (×2): qty 1

## 2020-12-19 MED ORDER — ONDANSETRON HCL 4 MG/2ML IJ SOLN
4.0000 mg | Freq: Four times a day (QID) | INTRAMUSCULAR | Status: DC | PRN
  Administered 2020-12-19 – 2020-12-20 (×4): 4 mg via INTRAVENOUS
  Filled 2020-12-19 (×5): qty 2

## 2020-12-19 MED ORDER — SODIUM CHLORIDE 0.9 % IV SOLN: INTRAVENOUS | Status: AC

## 2020-12-19 MED ORDER — ONDANSETRON HCL 4 MG/2ML IJ SOLN
4.0000 mg | Freq: Once | INTRAMUSCULAR | Status: AC
  Administered 2020-12-19: 4 mg via INTRAVENOUS

## 2020-12-19 MED ORDER — HYDROMORPHONE HCL 1 MG/ML IJ SOLN
1.0000 mg | INTRAMUSCULAR | Status: DC | PRN
  Administered 2020-12-19 – 2020-12-29 (×33): 1 mg via INTRAVENOUS
  Filled 2020-12-19 (×37): qty 1

## 2020-12-19 MED ORDER — FENTANYL CITRATE (PF) 100 MCG/2ML IJ SOLN
100.0000 ug | Freq: Once | INTRAMUSCULAR | Status: AC
  Administered 2020-12-19: 100 ug via INTRAVENOUS
  Filled 2020-12-19: qty 2

## 2020-12-19 MED ORDER — MOMETASONE FURO-FORMOTEROL FUM 200-5 MCG/ACT IN AERO
2.0000 | INHALATION_SPRAY | Freq: Two times a day (BID) | RESPIRATORY_TRACT | Status: DC
  Administered 2020-12-19 – 2020-12-30 (×22): 2 via RESPIRATORY_TRACT
  Filled 2020-12-19: qty 8.8

## 2020-12-19 MED ORDER — MONTELUKAST SODIUM 10 MG PO TABS
10.0000 mg | ORAL_TABLET | Freq: Every morning | ORAL | Status: DC
  Administered 2020-12-19 – 2020-12-30 (×12): 10 mg via ORAL
  Filled 2020-12-19 (×11): qty 1

## 2020-12-19 MED ORDER — OXYCODONE HCL ER 10 MG PO T12A
10.0000 mg | EXTENDED_RELEASE_TABLET | Freq: Two times a day (BID) | ORAL | Status: DC
  Administered 2020-12-19 – 2020-12-21 (×4): 10 mg via ORAL
  Filled 2020-12-19 (×4): qty 1

## 2020-12-19 NOTE — H&P (Signed)
History and Physical    [name] MWU:[id number] DOB: October 10, 1960 DOA: 12/18/2020  PCP: [name], MD  Patient coming from: Home.  Chief Complaint: Left hip pain.  HPI: Nicole Hines is a 60 y.o. female with history of chronic knee pain and asthma presents to the ER at [facility] with worsening left hip pain over the last 24 hours.  Patient denies any fall or trauma.  Denies any incontinence of urine or bowel.  Pain has been acutely worsening mostly centered around the left hip area.  Unable to walk because of the pain.  ED Course: In the ER on exam patient had difficulty doing the straight leg raising of the left lower extremity because of intense pain.  Patient also had a fever of 102 F in the ER.  Patient did have some cough which was nonproductive but chest x-ray was unremarkable COVID test was negative UA was unremarkable.  Given the pain and fever blood cultures were obtained since patient previously had a history of MRSA infection as per the report.  CT abdomen pelvis was done which showed disc bulge at L4-L5 with severe canal stenosis.  Patient admitted for further management of severe pain and fever.  No antibiotics were started at this time.  Labs show leukocytosis and mild hypokalemia.  Review of Systems: As per HPI, rest all negative.   Past Medical History:  Diagnosis Date  . Amenorrhea    irreg cycle  . Anxiety   . Arthritis   . Asthma   . MRSA infection   . Obesity     Past Surgical History:  Procedure Laterality Date  . COLONOSCOPY WITH PROPOFOL N/A 01/08/2014   Procedure: COLONOSCOPY WITH PROPOFOL;  Surgeon: [name], MD;  Location: [facility];  Service: Endoscopy;  Laterality: N/A;  . DILATION AND CURETTAGE OF UTERUS  1990  . LAPAROSCOPIC CHOLECYSTECTOMY  1994  . mrsa     had to have area cut out. abdomen     reports that she has never smoked. She has never used smokeless tobacco. She reports that she does not drink alcohol and  does not use drugs.  No Known Allergies  Family History  Problem Relation Age of Onset  . Cancer Mother        pancreatic  . Diabetes Mother   . Hypertension Mother   . Heart attack Father   . Heart attack Maternal Grandmother   . Diabetes Brother   . Cancer Maternal Aunt        uterine  . Diabetes Maternal Aunt   . Colon cancer Neg Hx     Prior to Admission medications   Medication Sig Start Date End Date Taking? Authorizing Provider  ALPRAZolam Duanne Moron) 0.5 MG tablet Take 1 mg by mouth at bedtime as needed for sleep. For anxiety   Yes [provider]  Calcium Carbonate-Vitamin D (CALCIUM 600+D3 PO) Take 1 tablet by mouth 2 (two) times daily.   Yes [provider]  fish oil-omega-3 fatty acids 1000 MG capsule Take 1 g by mouth 2 (two) times daily.   Yes [provider]  meloxicam (MOBIC) 15 MG tablet Take 15 mg by mouth daily.   Yes [provider]  methocarbamol (ROBAXIN) 750 MG tablet Take 750 mg by mouth at bedtime.   Yes [provider]  citalopram (CELEXA) 40 MG tablet Take 40 mg by mouth every morning.    [provider]  clindamycin (CLEOCIN) 150 MG  capsule Take 3 capsules (450 mg total) by mouth every 6 (six) hours. 04/06/16   Verner Mould, MD  Fluticasone-Salmeterol (ADVAIR) 250-50 MCG/DOSE AEPB Inhale 2 puffs into the lungs 2 (two) times daily.     [provider]  Glucosamine-Chondroit-Vit C-Mn (GLUCOSAMINE 1500 COMPLEX PO) Take 1 capsule by mouth 2 (two) times daily.    [provider]  montelukast (SINGULAIR) 10 MG tablet Take 10 mg by mouth every morning.  10/19/13   [provider]  Multiple Vitamin (MULTIVITAMIN WITH MINERALS) TABS tablet Take 1 tablet by mouth daily.    [provider]  oxyCODONE-acetaminophen (PERCOCET) 10-325 MG per tablet Take 1 tablet by mouth every 6 (six) hours as needed for pain.  10/25/13   [provider]  vitamin B-12 (CYANOCOBALAMIN)  1000 MCG tablet Take 1,000 mcg by mouth daily.    [provider]    Physical Exam: Constitutional: Moderately built and nourished. Vitals:   12/18/20 2230 12/18/20 2308 12/19/20 0130 12/19/20 0236  BP: 107/61 (!) 104/58 (!) 106/58 135/75  Pulse: 71 74 62 63  Resp: 20 16 (!) 21 20  Temp:  100.1 F (37.8 C) 98.7 F (37.1 C) 98.3 F (36.8 C)  TempSrc:  Oral Oral Oral  SpO2: 99% 98% 98% 100%  Weight:    106.2 kg  Height:    5' 7"  (1.702 m)   Eyes: Anicteric no pallor. ENMT: No discharge from the ears eyes nose or mouth. Neck: No mass felt.  No neck rigidity. Respiratory: No rhonchi or crepitations. Cardiovascular: S1-S2 heard. Abdomen: Soft nontender bowel sounds present. Musculoskeletal: Pain on moving left hip. Skin: No rash. Neurologic: Alert awake oriented to time place and person.  Moves all extremities. Psychiatric: Appears normal.  Normal affect.   Labs on Admission: I have personally reviewed following labs and imaging studies  CBC: Recent Labs  Lab 12/18/20 1105  WBC 16.0*  NEUTROABS 13.6*  HGB 12.5  HCT 37.9  MCV 92.7  PLT 242   Basic Metabolic Panel: Recent Labs  Lab 12/18/20 1105  NA 137  K 3.1*  CL 101  CO2 27  GLUCOSE 158*  BUN 15  CREATININE 0.54  CALCIUM 9.7   GFR: Estimated Creatinine Clearance: 94.9 mL/min (by C-G formula based on SCr of 0.54 mg/dL). Liver Function Tests: Recent Labs  Lab 12/18/20 1105  AST 15  ALT 16  ALKPHOS 63  BILITOT 0.9  PROT 7.6  ALBUMIN 4.2   No results for input(s): LIPASE, AMYLASE in the last 168 hours. No results for input(s): AMMONIA in the last 168 hours. Coagulation Profile: No results for input(s): INR, PROTIME in the last 168 hours. Cardiac Enzymes: No results for input(s): CKTOTAL, CKMB, CKMBINDEX, TROPONINI in the last 168 hours. BNP (last 3 results) No results for input(s): PROBNP in the last 8760 hours. HbA1C: No results for input(s): HGBA1C in the last 72 hours. CBG: No  results for input(s): GLUCAP in the last 168 hours. Lipid Profile: No results for input(s): CHOL, HDL, LDLCALC, TRIG, CHOLHDL, LDLDIRECT in the last 72 hours. Thyroid Function Tests: No results for input(s): TSH, T4TOTAL, FREET4, T3FREE, THYROIDAB in the last 72 hours. Anemia Panel: No results for input(s): VITAMINB12, FOLATE, FERRITIN, TIBC, IRON, RETICCTPCT in the last 72 hours. Urine analysis:    Component Value Date/Time   COLORURINE YELLOW 12/18/2020 2012   APPEARANCEUR CLEAR 12/18/2020 2012   LABSPEC >1.046 (H) 12/18/2020 2012   PHURINE 6.5 12/18/2020 2012   GLUCOSEU NEGATIVE 12/18/2020 2012  [place] NEGATIVE 12/18/2020 2012   BILIRUBINUR NEGATIVE 12/18/2020 2012   KETONESUR 15 (A) 12/18/2020 2012   PROTEINUR 30 (A) 12/18/2020 2012   UROBILINOGEN 0.2 11/20/2013 1603   NITRITE NEGATIVE 12/18/2020 2012   LEUKOCYTESUR NEGATIVE 12/18/2020 2012   Sepsis Labs: @LABRCNTIP (procalcitonin:4,lacticidven:4) ) Recent Results (from the past 240 hour(s))  Resp Panel by RT-PCR (Flu A&B, Covid) Nasopharyngeal Swab     Status: None   Collection Time: 12/18/20  9:10 PM   Specimen: Nasopharyngeal Swab; Nasopharyngeal(NP) swabs in vial transport medium  Result Value Ref Range Status   SARS Coronavirus 2 by RT PCR NEGATIVE NEGATIVE Final    Comment: (NOTE) SARS-CoV-2 target nucleic acids are NOT DETECTED.  The SARS-CoV-2 RNA is generally detectable in upper respiratory specimens during the acute phase of infection. The lowest concentration of SARS-CoV-2 viral copies this assay can detect is 138 copies/mL. A negative result does not preclude SARS-Cov-2 infection and should not be used as the sole basis for treatment or other patient management decisions. A negative result may occur with  improper specimen collection/handling, submission of specimen other than nasopharyngeal swab, presence of viral mutation(s) within the areas targeted by this assay, and inadequate number of  viral copies(<138 copies/mL). A negative result must be combined with clinical observations, patient history, and epidemiological information. The expected result is Negative.  Fact Sheet for Patients:  EntrepreneurPulse.com.au  Fact Sheet for Healthcare Providers:  IncredibleEmployment.be  This test is no t yet approved or cleared by the [place] FDA and  has been authorized for detection and/or diagnosis of SARS-CoV-2 by FDA under an Emergency Use Authorization (EUA). This EUA will remain  in effect (meaning this test can be used) for the duration of the COVID-19 declaration under Section 564(b)(1) of the Act, 21 U.S.C.section 360bbb-3(b)(1), unless the authorization is terminated  or revoked sooner.       Influenza A by PCR NEGATIVE NEGATIVE Final   Influenza B by PCR NEGATIVE NEGATIVE Final    Comment: (NOTE) The Xpert Xpress SARS-CoV-2/FLU/RSV plus assay is intended as an aid in the diagnosis of influenza from Nasopharyngeal swab specimens and should not be used as a sole basis for treatment. Nasal washings and aspirates are unacceptable for Xpert Xpress SARS-CoV-2/FLU/RSV testing.  Fact Sheet for Patients: EntrepreneurPulse.com.au  Fact Sheet for Healthcare Providers: IncredibleEmployment.be  This test is not yet approved or cleared by the [place] FDA and has been authorized for detection and/or diagnosis of SARS-CoV-2 by FDA under an Emergency Use Authorization (EUA). This EUA will remain in effect (meaning this test can be used) for the duration of the COVID-19 declaration under Section 564(b)(1) of the Act, 21 U.S.C. section 360bbb-3(b)(1), unless the authorization is terminated or revoked.  Performed at [facility], [address] [place], [place] [address]      Radiological Exams on Admission: CT ABDOMEN PELVIS W CONTRAST  Result Date:  12/18/2020 CLINICAL DATA:  LEFT lower quadrant abdominal pain and LEFT hip pain. Concern for diverticulitis. Concern for adult hip fracture. EXAM: CT ABDOMEN AND PELVIS WITH CONTRAST RECONSTRUCTED IMAGES OF THE PELVIS TECHNIQUE: Multidetector CT imaging of the abdomen and pelvis was performed using the standard protocol following bolus administration of intravenous contrast. Reconstructions performed of the osseous pelvis. CONTRAST:  144m OMNIPAQUE IOHEXOL 300 MG/ML  SOLN COMPARISON:  CT abdomen dated 03/31/2006. FINDINGS: Lower chest: No acute abnormality. Hepatobiliary: No focal liver abnormality is seen. Status post cholecystectomy with associated mild bile duct ectasia. Pancreas: Partially infiltrated with fat but  otherwise unremarkable. Spleen: Normal in size without focal abnormality. Adrenals/Urinary Tract: Adrenal glands appear normal. Kidneys are unremarkable without mass, stone or hydronephrosis. No ureteral or bladder calculi are identified. Bladder is unremarkable. Stomach/Bowel: No dilated large or small bowel loops. No evidence of bowel wall inflammation. Stomach is unremarkable, partially decompressed. Vascular/Lymphatic: No significant vascular findings are present. No enlarged abdominal or pelvic lymph nodes. Reproductive: Uterus and bilateral adnexa are unremarkable. Other: No free fluid or abscess collection is seen. No free intraperitoneal air. Musculoskeletal: Degenerative spondylosis of the thoracolumbar spine, mild to moderate in degree. Associated disc bulge at L4-5 is causing severe central canal stenosis with suspected associated nerve root impingement. Reconstruction images of the pelvis are provided to include the upper femurs bilaterally. There is a benign osteochondroma exophytic to the proximal shaft of the RIGHT femur. No acute or suspicious osseous abnormality. No fracture is seen at the LEFT hip or within the proximal LEFT femur. Mild degenerative spurring at the LEFT hip.  IMPRESSION: 1. No acute findings within the abdomen or pelvis. No bowel obstruction or evidence of bowel wall inflammation. No evidence of diverticulitis. No evidence of acute solid organ abnormality. No renal or ureteral calculi. 2. Degenerative spondylosis of the thoracolumbar spine, most significant in the lower lumbar spine. Associated large disc bulge at L4-5 is causing severe central canal stenosis. If symptoms could be radiculopathic symptoms, would consider nonemergent lumbar spine MRI to exclude associated nerve root impingement. 3. No fracture within the osseous pelvis or at the LEFT hip. Proximal LEFT femur is intact and normally aligned. Mild degenerative change at the LEFT hip. Electronically Signed   By: Franki Cabot M.D.   On: 12/18/2020 16:52   US Venous Img Lower  Left (DVT Study)  Result Date: 12/18/2020 CLINICAL DATA:  60 year old with left hip and buttock pain. EXAM: LEFT LOWER EXTREMITY VENOUS DOPPLER ULTRASOUND TECHNIQUE: Gray-scale sonography with graded compression, as well as color Doppler and duplex ultrasound were performed to evaluate the lower extremity deep venous systems from the level of the common femoral vein and including the common femoral, femoral, profunda femoral, popliteal and calf veins including the posterior tibial, peroneal and gastrocnemius veins when visible. The superficial great saphenous vein was also interrogated. Spectral Doppler was utilized to evaluate flow at rest and with distal augmentation maneuvers in the common femoral, femoral and popliteal veins. COMPARISON:  None. FINDINGS: Contralateral Common Femoral Vein: Respiratory phasicity is normal and symmetric with the symptomatic side. No evidence of thrombus. Normal compressibility. Common Femoral Vein: No evidence of thrombus. Normal compressibility, respiratory phasicity and response to augmentation. Saphenofemoral Junction: No evidence of thrombus. Normal compressibility and flow on color Doppler  imaging. Profunda Femoral Vein: No evidence of thrombus. Normal compressibility and flow on color Doppler imaging. Femoral Vein: No evidence of thrombus. Normal compressibility, respiratory phasicity and response to augmentation. Popliteal Vein: No evidence of thrombus. Normal compressibility, respiratory phasicity and response to augmentation. Calf Veins: No evidence of thrombus. Normal compressibility and flow on color Doppler imaging. Superficial Great Saphenous Vein: No evidence of thrombus. Normal compressibility. Other Findings:  None. IMPRESSION: Negative for deep venous thrombosis in left lower extremity. Electronically Signed   By: Markus Daft M.D.   On: 12/18/2020 11:49   DG Chest Portable 1 View  Result Date: 12/18/2020 CLINICAL DATA:  Fever EXAM: PORTABLE CHEST 1 VIEW COMPARISON:  None. FINDINGS: Borderline cardiomegaly. Lungs are clear. No pleural effusion or pneumothorax is seen. Osseous structures about the chest are unremarkable. IMPRESSION: No active  disease. No evidence of pneumonia or pulmonary edema. Borderline cardiomegaly. Electronically Signed   By: Franki Cabot M.D.   On: 12/18/2020 20:07   CT NO CHARGE  Result Date: 12/18/2020 CLINICAL DATA:  LEFT lower quadrant abdominal pain and LEFT hip pain. Concern for diverticulitis. Concern for adult hip fracture. EXAM: CT ABDOMEN AND PELVIS WITH CONTRAST RECONSTRUCTED IMAGES OF THE PELVIS TECHNIQUE: Multidetector CT imaging of the abdomen and pelvis was performed using the standard protocol following bolus administration of intravenous contrast. Reconstructions performed of the osseous pelvis. CONTRAST:  158m OMNIPAQUE IOHEXOL 300 MG/ML  SOLN COMPARISON:  CT abdomen dated 03/31/2006. FINDINGS: Lower chest: No acute abnormality. Hepatobiliary: No focal liver abnormality is seen. Status post cholecystectomy with associated mild bile duct ectasia. Pancreas: Partially infiltrated with fat but otherwise unremarkable. Spleen: Normal in size  without focal abnormality. Adrenals/Urinary Tract: Adrenal glands appear normal. Kidneys are unremarkable without mass, stone or hydronephrosis. No ureteral or bladder calculi are identified. Bladder is unremarkable. Stomach/Bowel: No dilated large or small bowel loops. No evidence of bowel wall inflammation. Stomach is unremarkable, partially decompressed. Vascular/Lymphatic: No significant vascular findings are present. No enlarged abdominal or pelvic lymph nodes. Reproductive: Uterus and bilateral adnexa are unremarkable. Other: No free fluid or abscess collection is seen. No free intraperitoneal air. Musculoskeletal: Degenerative spondylosis of the thoracolumbar spine, mild to moderate in degree. Associated disc bulge at L4-5 is causing severe central canal stenosis with suspected associated nerve root impingement. Reconstruction images of the pelvis are provided to include the upper femurs bilaterally. There is a benign osteochondroma exophytic to the proximal shaft of the RIGHT femur. No acute or suspicious osseous abnormality. No fracture is seen at the LEFT hip or within the proximal LEFT femur. Mild degenerative spurring at the LEFT hip. IMPRESSION: 1. No acute findings within the abdomen or pelvis. No bowel obstruction or evidence of bowel wall inflammation. No evidence of diverticulitis. No evidence of acute solid organ abnormality. No renal or ureteral calculi. 2. Degenerative spondylosis of the thoracolumbar spine, most significant in the lower lumbar spine. Associated large disc bulge at L4-5 is causing severe central canal stenosis. If symptoms could be radiculopathic symptoms, would consider nonemergent lumbar spine MRI to exclude associated nerve root impingement. 3. No fracture within the osseous pelvis or at the LEFT hip. Proximal LEFT femur is intact and normally aligned. Mild degenerative change at the LEFT hip. Electronically Signed   By: SFranki CabotM.D.   On: 12/18/2020 16:52   DG Hip  Unilat W or Wo Pelvis 2-3 Views Left  Result Date: 12/18/2020 CLINICAL DATA:  LEFT hip pain, no known injury. EXAM: DG HIP (WITH OR WITHOUT PELVIS) 2-3V LEFT COMPARISON:  Plain film of the pelvis dated 09/26/2006 FINDINGS: Single-view of the pelvis and two views of the LEFT hip are provided. Osseous alignment appears normal. No fracture line or displaced fracture fragment is seen. Mild degenerative spurring at the bilateral hip joints. IMPRESSION: 1. No acute findings. 2. Mild degenerative change. Electronically Signed   By: SFranki CabotM.D.   On: 12/18/2020 11:52     Assessment/Plan Principal Problem:   SIRS (systemic inflammatory response syndrome) (HCC) Active Problems:   Left hip pain    1. SIRS -source not clear patient had temperature of 102 F in the ER.  Given that patient has significant left hip pain MRI of the left hip has been ordered along with MRI of the lumbar spine.  Chest x-ray UA were unremarkable.  COVID test was  negative.  Check sed rate. 2. Left hip pain with CT scan showing disc bulge at L4-L5 with severe canal stenosis which could be causing radiculopathy pain in the left hip.  However given the fever and SIRS picture I have ordered L-spine and hip MRI.  And also sed rate.  Following which we will have further plan. 3. Chronic pain on pain relief medications.  Presently on IV pain medication due to acute worsening. 4. History of asthma presently not wheezing. 5. Hypokalemia -replace and recheck.  Patient's home medications needs to be verified.   DVT prophylaxis: SCDs until we get MRI results. Code Status: Full code. Family Communication: Discussed with patient. Disposition Plan: Home. Consults called: None. Admission status: Observation.   Rise Patience MD [facility] Pager [phone].  If 7PM-7AM, please contact night-coverage [email or web address] Password [email or web address]  12/19/2020, 4:00 AM

## 2020-12-19 NOTE — Progress Notes (Signed)
   12/19/20 0304  Provider Notification  Provider Name/Title Hal Hope, MD  Date Provider Notified 12/19/20  Time Provider Notified [phone]  Notification Type Page  Notification Reason Other (Comment) (Patient arrival to floor)  Provider response Other (Comment);No new orders (MD called, said wold be by to see patient)  Date of Provider Response 12/19/20  Time of Provider Response 0310    Patient arrived to floor. On-call provider notified. This RN at bedside

## 2020-12-19 NOTE — Progress Notes (Signed)
   12/19/20 0964  Provider Notification  Provider Name/Title Hal Hope, MD  Date Provider Notified 12/19/20  Time Provider Notified [phone]  Notification Type Page  Notification Reason Other (Comment) (Pt continues to have nausea and vomiting)  Provider response See new orders (Called over the phone)  Date of Provider Response 12/19/20  Time of Provider Response [phone]

## 2020-12-19 NOTE — ED Notes (Signed)
Report given to [facility]; All questions answered.

## 2020-12-19 NOTE — ED Notes (Signed)
Patient informed of Admission and Transfer Process. Patient signed Transfer Consent Form willingly.

## 2020-12-19 NOTE — Progress Notes (Signed)
   12/19/20 0503  Provider Notification  Provider Name/Title Hal Hope, MD  Date Provider Notified 12/19/20  Time Provider Notified [phone]  Notification Type Page  Notification Reason Other (Comment) (Pt vomiting and asking for nausea medication)  Provider response See new orders  Date of Provider Response 12/19/20  Time of Provider Response [phone]

## 2020-12-19 NOTE — ED Notes (Signed)
[facility] at Bedside. No Questions at this time.

## 2020-12-19 NOTE — Progress Notes (Signed)
PROGRESS NOTE    Nicole Hines  RCV:[id number] DOB: [birth year] DOA: 12/18/2020 PCP: [name], MD  Outpatient Specialists:   Brief Narrative:  As per H&P done by Dr. Hal Hope earlier today: "Nicole Hines is a 60 y.o. female with history of chronic knee pain and asthma presents to the ER at [name] with worsening left hip pain over the last 24 hours.  Patient denies any fall or trauma.  Denies any incontinence of urine or bowel.  Pain has been acutely worsening mostly centered around the left hip area.  Unable to walk because of the pain.  ED Course: In the ER on exam patient had difficulty doing the straight leg raising of the left lower extremity because of intense pain.  Patient also had a fever of 102 F in the ER.  Patient did have some cough which was nonproductive but chest x-ray was unremarkable COVID test was negative UA was unremarkable.  Given the pain and fever blood cultures were obtained since patient previously had a history of MRSA infection as per the report.  CT abdomen pelvis was done which showed disc bulge at L4-L5 with severe canal stenosis.  Patient admitted for further management of severe pain and fever.  No antibiotics were started at this time.  Labs show leukocytosis and mild hypokalemia".  12/19/2020: Patient seen.  MRI result reviewed.  MRI revealed left septic sacroiliac joint.  Discussed with the orthopedic surgeon on-call, Dr. Rod Can.  Dr. Lyla Glassing has advised consulting IR and ID.  I have discussed with the interventional radiologist on-call, Dr. Anselm Pancoast, considering that patient will need joint aspirate sent for culture prior to starting antibiotics.  I have also discussed with the infectious disease consultant on-call, Dr. Prudencio Pair regarding orthopedic team's recommendation.  Dr. Gale Journey plans to start patient on vancomycin and cefepime after joint aspirate has been sent for culture.  Patient had 2 blood cultures sent yesterday.  Hip pain  persists, especially, when patient moves.  Leukocytosis persists.  T-max of 102.7 F.  Assessment & Plan:   Principal Problem:   SIRS (systemic inflammatory response syndrome) (HCC) Active Problems:   Left hip pain  Left septic sacroiliac joint/SIRS -source not clear patient had temperature of 102 F in the ER.  Given that patient has significant left hip pain MRI of the left hip has been ordered along with MRI of the lumbar spine.  Chest x-ray UA were unremarkable.  COVID test was negative.  Check sed rate. 12/19/2020: MRI revealed left septic sacroiliac joint.  Orthopedic team has been consulted (Dr. Rod Can).  IR and infectious disease teams have been consulted.  Patient had blood cultures done last night.  The plan is for IR to try joint aspirate and send samples for cultures prior to starting antibiotics.  Infectious disease team hopes to start patient on vancomycin and cefepime once joint aspirate has been sent for cultures.  Left hip pain with CT scan showing disc bulge at L4-L5 with severe canal stenosis which could be causing radiculopathy pain in the left hip.  However given the fever and SIRS picture I have ordered L-spine and hip MRI.  And also sed rate.  Following which we will have further plan.  12/19/2020: Please see above.  Chronic pain on pain relief medications.  Presently on IV pain medication due to acute worsening.  History of asthma presently not wheezing.  Hypokalemia -replace and recheck. 12/19/2020: Replace.  Potassium is 4 today.   DVT prophylaxis:  SCD Code Status: Full code Family Communication:  Disposition Plan: This will depend on hospital course   Consultants:   Orthopedics, Dr. Rod Can.  Infectious disease, Dr. Johnny Bridge VU  Interventional radiology, Dr. Anselm Pancoast  Procedures:   None  Antimicrobials:   None  The plan is to start patient on IV vancomycin and cefepime once joint aspirate has been sent for culture.   Subjective: Severe  pain on moving.  Objective: Vitals:   12/19/20 0236 12/19/20 0413 12/19/20 0700 12/19/20 1117  BP: 135/75  (!) 163/71 111/67  Pulse: 63  96 69  Resp: 20  20 17   Temp: 98.3 F (36.8 C)  (!) 102.7 F (39.3 C) 98.6 F (37 C)  TempSrc: Oral  Oral Oral  SpO2: 100% 96% 97% 99%  Weight: 106.2 kg     Height: 5' 7"  (1.702 m)       Intake/Output Summary (Last 24 hours) at 12/19/2020 1433 Last data filed at 12/19/2020 1210 Gross per 24 hour  Intake 1028.37 ml  Output 501 ml  Net 527.37 ml   Filed Weights   12/18/20 1027 12/19/20 0236  Weight: 113.4 kg 106.2 kg    Examination:  General exam: Appears calm and comfortable while not moving.  Severe lower back pain once patient tries to move.   Respiratory system: Decreased air entry. Cardiovascular system: S1 & S2  Gastrointestinal system: Abdomen is obese, soft and nontender. No organomegaly or masses felt. Normal bowel sounds heard. Central nervous system: Alert and oriented.  Patient moves all extremities.   Extremities: No leg edema.  Data Reviewed: I have personally reviewed following labs and imaging studies  CBC: Recent Labs  Lab 12/18/20 1105 12/19/20 0751  WBC 16.0* 15.5*  NEUTROABS 13.6* 13.5*  HGB 12.5 12.1  HCT 37.9 36.7  MCV 92.7 93.9  PLT 205 671   Basic Metabolic Panel: Recent Labs  Lab 12/18/20 1105 12/19/20 0751  NA 137 139  K 3.1* 4.0  CL 101 104  CO2 27 26  GLUCOSE 158* 185*  BUN 15 17  CREATININE 0.54 0.59  CALCIUM 9.7 9.7   GFR: Estimated Creatinine Clearance: 94.9 mL/min (by C-G formula based on SCr of 0.59 mg/dL). Liver Function Tests: Recent Labs  Lab 12/18/20 1105 12/19/20 0751  AST 15 25  ALT 16 25  ALKPHOS 63 69  BILITOT 0.9 0.8  PROT 7.6 8.0  ALBUMIN 4.2 3.4*   No results for input(s): LIPASE, AMYLASE in the last 168 hours. No results for input(s): AMMONIA in the last 168 hours. Coagulation Profile: No results for input(s): INR, PROTIME in the last 168 hours. Cardiac  Enzymes: No results for input(s): CKTOTAL, CKMB, CKMBINDEX, TROPONINI in the last 168 hours. BNP (last 3 results) No results for input(s): PROBNP in the last 8760 hours. HbA1C: No results for input(s): HGBA1C in the last 72 hours. CBG: No results for input(s): GLUCAP in the last 168 hours. Lipid Profile: No results for input(s): CHOL, HDL, LDLCALC, TRIG, CHOLHDL, LDLDIRECT in the last 72 hours. Thyroid Function Tests: No results for input(s): TSH, T4TOTAL, FREET4, T3FREE, THYROIDAB in the last 72 hours. Anemia Panel: No results for input(s): VITAMINB12, FOLATE, FERRITIN, TIBC, IRON, RETICCTPCT in the last 72 hours. Urine analysis:    Component Value Date/Time   COLORURINE YELLOW 12/18/2020 2012   APPEARANCEUR CLEAR 12/18/2020 2012   LABSPEC >1.046 (H) 12/18/2020 2012   PHURINE 6.5 12/18/2020 2012   GLUCOSEU NEGATIVE 12/18/2020 2012   HGBUR NEGATIVE 12/18/2020 2012   BILIRUBINUR  NEGATIVE 12/18/2020 2012   KETONESUR 15 (A) 12/18/2020 2012   PROTEINUR 30 (A) 12/18/2020 2012   UROBILINOGEN 0.2 11/20/2013 1603   NITRITE NEGATIVE 12/18/2020 2012   LEUKOCYTESUR NEGATIVE 12/18/2020 2012   Sepsis Labs: @LABRCNTIP (procalcitonin:4,lacticidven:4)  ) Recent Results (from the past 240 hour(s))  Resp Panel by RT-PCR (Flu A&B, Covid) Nasopharyngeal Swab     Status: None   Collection Time: 12/18/20  9:10 PM   Specimen: Nasopharyngeal Swab; Nasopharyngeal(NP) swabs in vial transport medium  Result Value Ref Range Status   SARS Coronavirus 2 by RT PCR NEGATIVE NEGATIVE Final    Comment: (NOTE) SARS-CoV-2 target nucleic acids are NOT DETECTED.  The SARS-CoV-2 RNA is generally detectable in upper respiratory specimens during the acute phase of infection. The lowest concentration of SARS-CoV-2 viral copies this assay can detect is 138 copies/mL. A negative result does not preclude SARS-Cov-2 infection and should not be used as the sole basis for treatment or other patient management  decisions. A negative result may occur with  improper specimen collection/handling, submission of specimen other than nasopharyngeal swab, presence of viral mutation(s) within the areas targeted by this assay, and inadequate number of viral copies(<138 copies/mL). A negative result must be combined with clinical observations, patient history, and epidemiological information. The expected result is Negative.  Fact Sheet for Patients:  EntrepreneurPulse.com.au  Fact Sheet for Healthcare Providers:  IncredibleEmployment.be  This test is no t yet approved or cleared by the [place] FDA and  has been authorized for detection and/or diagnosis of SARS-CoV-2 by FDA under an Emergency Use Authorization (EUA). This EUA will remain  in effect (meaning this test can be used) for the duration of the COVID-19 declaration under Section 564(b)(1) of the Act, 21 U.S.C.section 360bbb-3(b)(1), unless the authorization is terminated  or revoked sooner.       Influenza A by PCR NEGATIVE NEGATIVE Final   Influenza B by PCR NEGATIVE NEGATIVE Final    Comment: (NOTE) The Xpert Xpress SARS-CoV-2/FLU/RSV plus assay is intended as an aid in the diagnosis of influenza from Nasopharyngeal swab specimens and should not be used as a sole basis for treatment. Nasal washings and aspirates are unacceptable for Xpert Xpress SARS-CoV-2/FLU/RSV testing.  Fact Sheet for Patients: EntrepreneurPulse.com.au  Fact Sheet for Healthcare Providers: IncredibleEmployment.be  This test is not yet approved or cleared by the [place] FDA and has been authorized for detection and/or diagnosis of SARS-CoV-2 by FDA under an Emergency Use Authorization (EUA). This EUA will remain in effect (meaning this test can be used) for the duration of the COVID-19 declaration under Section 564(b)(1) of the Act, 21 U.S.C. section 360bbb-3(b)(1), unless the  authorization is terminated or revoked.  Performed at [facility], [address] [place], [place] [address]          Radiology Studies: MR LUMBAR SPINE WO CONTRAST  Result Date: 12/19/2020 CLINICAL DATA:  60 year old female with low back pain. Evidence of L4-L5 spinal stenosis on CT Abdomen and Pelvis yesterday. Possible infection. EXAM: MRI LUMBAR SPINE WITHOUT CONTRAST TECHNIQUE: Multiplanar, multisequence MR imaging of the lumbar spine was performed. No intravenous contrast was administered. COMPARISON:  CT Abdomen and Pelvis 12/18/2020. Lumbar MRI 12/11/2006. FINDINGS: Segmentation:  Normal. Alignment: Mildly increased lumbar lordosis since 2008. Mild grade 1 anterolisthesis of L4 on L5, 2-3 mm is new since that time. Similar subtle anterolisthesis of L5 on S1. Vertebrae: No marrow edema or evidence of acute osseous abnormality. Visualized bone marrow signal is within normal limits. Intact  visible sacrum and SI joints. Conus medullaris and cauda equina: Conus extends to the T12-L1 level. No lower spinal cord or conus signal abnormality. Paraspinal and other soft tissues: Visible abdominal and pelvic viscera appear stable since yesterday. There is streaky inflammatory signal in the left lower lumbar erector spinae muscles on series 7 images 13 and 17, more pronounced at the L5 transverse process level. Negative lumbar paraspinal soft tissues otherwise. Disc levels: T11-T12: Negative. T12-L1:  Mild disc bulging.  No stenosis. L1-L2: Negative disc. Mild facet hypertrophy. Mild degenerative increased STIR signal in the interspinous ligament. No stenosis. L2-L3: Circumferential, mostly foraminal disc bulging. Mild to moderate facet hypertrophy greater on the left. Trace degenerative facet and interspinous ligament fluid signal. No spinal stenosis. Moderate bilateral L2 foraminal stenosis. L3-L4: Mild disc bulging and facet hypertrophy. Mild left L3 foraminal stenosis. L4-L5: Mild  anterolisthesis with circumferential disc/pseudo disc. Broad-based posterior component. Moderate ligament flavum and moderate to severe facet hypertrophy. Moderate to severe spinal stenosis. At least moderate bilateral lateral recess stenosis (L5 nerve levels). Mild to moderate left L4 neural foraminal stenosis. L5-S1: Subtle anterolisthesis and disc bulging. Moderate facet hypertrophy. No spinal or lateral recess stenosis. Mild to moderate bilateral L5 foraminal stenosis. IMPRESSION: 1. Inflammatory signal in the left lower erector spinae muscle at L5 and S1, and adjacent to the left L5 transverse process is nonspecific but more resembles degenerative or posttraumatic muscle edema than infection. No paraspinal fluid collection. No evidence of discitis osteomyelitis. 2. L4-L5 moderate to severe spinal, at least moderate lateral recess, and up to moderate bilateral neural foraminal stenosis related to disc and posterior element degeneration in the setting of grade 1 spondylolisthesis. 3. Mild L5-S1 spondylolisthesis with up to moderate bilateral L5 foraminal stenosis. 4. L2-L3 disc bulging with moderate bilateral L2 neural foraminal stenosis. Electronically Signed   By: Genevie Ann M.D.   On: 12/19/2020 10:58   CT ABDOMEN PELVIS W CONTRAST  Result Date: 12/18/2020 CLINICAL DATA:  LEFT lower quadrant abdominal pain and LEFT hip pain. Concern for diverticulitis. Concern for adult hip fracture. EXAM: CT ABDOMEN AND PELVIS WITH CONTRAST RECONSTRUCTED IMAGES OF THE PELVIS TECHNIQUE: Multidetector CT imaging of the abdomen and pelvis was performed using the standard protocol following bolus administration of intravenous contrast. Reconstructions performed of the osseous pelvis. CONTRAST:  141m OMNIPAQUE IOHEXOL 300 MG/ML  SOLN COMPARISON:  CT abdomen dated 03/31/2006. FINDINGS: Lower chest: No acute abnormality. Hepatobiliary: No focal liver abnormality is seen. Status post cholecystectomy with associated mild bile duct  ectasia. Pancreas: Partially infiltrated with fat but otherwise unremarkable. Spleen: Normal in size without focal abnormality. Adrenals/Urinary Tract: Adrenal glands appear normal. Kidneys are unremarkable without mass, stone or hydronephrosis. No ureteral or bladder calculi are identified. Bladder is unremarkable. Stomach/Bowel: No dilated large or small bowel loops. No evidence of bowel wall inflammation. Stomach is unremarkable, partially decompressed. Vascular/Lymphatic: No significant vascular findings are present. No enlarged abdominal or pelvic lymph nodes. Reproductive: Uterus and bilateral adnexa are unremarkable. Other: No free fluid or abscess collection is seen. No free intraperitoneal air. Musculoskeletal: Degenerative spondylosis of the thoracolumbar spine, mild to moderate in degree. Associated disc bulge at L4-5 is causing severe central canal stenosis with suspected associated nerve root impingement. Reconstruction images of the pelvis are provided to include the upper femurs bilaterally. There is a benign osteochondroma exophytic to the proximal shaft of the RIGHT femur. No acute or suspicious osseous abnormality. No fracture is seen at the LEFT hip or within the proximal LEFT femur. Mild  degenerative spurring at the LEFT hip. IMPRESSION: 1. No acute findings within the abdomen or pelvis. No bowel obstruction or evidence of bowel wall inflammation. No evidence of diverticulitis. No evidence of acute solid organ abnormality. No renal or ureteral calculi. 2. Degenerative spondylosis of the thoracolumbar spine, most significant in the lower lumbar spine. Associated large disc bulge at L4-5 is causing severe central canal stenosis. If symptoms could be radiculopathic symptoms, would consider nonemergent lumbar spine MRI to exclude associated nerve root impingement. 3. No fracture within the osseous pelvis or at the LEFT hip. Proximal LEFT femur is intact and normally aligned. Mild degenerative change  at the LEFT hip. Electronically Signed   By: Franki Cabot M.D.   On: 12/18/2020 16:52   MR HIP LEFT WO CONTRAST  Result Date: 12/19/2020 CLINICAL DATA:  Soft tissue infection. EXAM: MR OF THE LEFT HIP WITHOUT CONTRAST TECHNIQUE: Multiplanar, multisequence MR imaging was performed. No intravenous contrast was administered. COMPARISON:  None. FINDINGS: Bones: No hip fracture, dislocation or avascular necrosis. No periosteal reaction or bone destruction. No aggressive osseous lesion. Small left SI joint effusion with adjacent soft tissue edema anteriorly and posteriorly in the adjacent multifidus muscle concerning for septic arthritis of the left SI joint. 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 Articular cartilage and labrum Articular cartilage: Partial-thickness cartilage loss of the left femoral head and acetabulum. Labrum: Limited evaluation secondary lack of intra-articular contrast. Degeneration of the left superior labrum. Joint or bursal effusion Joint effusion:  No hip joint effusion. Bursae:  No bursa formation. Muscles and tendons Flexors: Normal. Extensors: Normal. Abductors: Normal. Adductors: Normal. Gluteals: Moderate tendinosis of the left gluteus medius tendon insertion. Mild tendinosis of the left gluteus minimus tendon insertion. Partial-thickness tear of the right gluteus medius tendon insertion. Hamstrings: Mild tendinosis of the left hamstring origin with a partial-thickness tear. Small partial-thickness tear of the right hamstring origin. Other findings Small amount of pelvic free fluid. No fluid collection or hematoma. No inguinal lymphadenopathy. No inguinal hernia. IMPRESSION: 1. Small left SI joint effusion with adjacent soft tissue edema anteriorly and posteriorly in the adjacent multifidus muscle concerning for septic arthritis of the left SI joint. 2. Moderate tendinosis of the left gluteus medius tendon insertion. Mild tendinosis of the left gluteus minimus tendon insertion.  Partial-thickness tear of the right gluteus medius tendon insertion. 3. Mild tendinosis of the left hamstring origin with a partial-thickness tear. Small partial-thickness tear of the right hamstring origin. 4. Mild osteoarthritis of the left hip. Electronically Signed   By: Kathreen Devoid   On: 12/19/2020 10:49   US Venous Img Lower  Left (DVT Study)  Result Date: 12/18/2020 CLINICAL DATA:  60 year old with left hip and buttock pain. EXAM: LEFT LOWER EXTREMITY VENOUS DOPPLER ULTRASOUND TECHNIQUE: Gray-scale sonography with graded compression, as well as color Doppler and duplex ultrasound were performed to evaluate the lower extremity deep venous systems from the level of the common femoral vein and including the common femoral, femoral, profunda femoral, popliteal and calf veins including the posterior tibial, peroneal and gastrocnemius veins when visible. The superficial great saphenous vein was also interrogated. Spectral Doppler was utilized to evaluate flow at rest and with distal augmentation maneuvers in the common femoral, femoral and popliteal veins. COMPARISON:  None. FINDINGS: Contralateral Common Femoral Vein: Respiratory phasicity is normal and symmetric with the symptomatic side. No evidence of thrombus. Normal compressibility. Common Femoral Vein: No evidence of thrombus. Normal compressibility, respiratory phasicity and response to augmentation. Saphenofemoral Junction: No evidence  of thrombus. Normal compressibility and flow on color Doppler imaging. Profunda Femoral Vein: No evidence of thrombus. Normal compressibility and flow on color Doppler imaging. Femoral Vein: No evidence of thrombus. Normal compressibility, respiratory phasicity and response to augmentation. Popliteal Vein: No evidence of thrombus. Normal compressibility, respiratory phasicity and response to augmentation. Calf Veins: No evidence of thrombus. Normal compressibility and flow on color Doppler imaging. Superficial Great  Saphenous Vein: No evidence of thrombus. Normal compressibility. Other Findings:  None. IMPRESSION: Negative for deep venous thrombosis in left lower extremity. Electronically Signed   By: Markus Daft M.D.   On: 12/18/2020 11:49   DG Chest Portable 1 View  Result Date: 12/18/2020 CLINICAL DATA:  Fever EXAM: PORTABLE CHEST 1 VIEW COMPARISON:  None. FINDINGS: Borderline cardiomegaly. Lungs are clear. No pleural effusion or pneumothorax is seen. Osseous structures about the chest are unremarkable. IMPRESSION: No active disease. No evidence of pneumonia or pulmonary edema. Borderline cardiomegaly. Electronically Signed   By: Franki Cabot M.D.   On: 12/18/2020 20:07   CT NO CHARGE  Result Date: 12/18/2020 CLINICAL DATA:  LEFT lower quadrant abdominal pain and LEFT hip pain. Concern for diverticulitis. Concern for adult hip fracture. EXAM: CT ABDOMEN AND PELVIS WITH CONTRAST RECONSTRUCTED IMAGES OF THE PELVIS TECHNIQUE: Multidetector CT imaging of the abdomen and pelvis was performed using the standard protocol following bolus administration of intravenous contrast. Reconstructions performed of the osseous pelvis. CONTRAST:  15m OMNIPAQUE IOHEXOL 300 MG/ML  SOLN COMPARISON:  CT abdomen dated 03/31/2006. FINDINGS: Lower chest: No acute abnormality. Hepatobiliary: No focal liver abnormality is seen. Status post cholecystectomy with associated mild bile duct ectasia. Pancreas: Partially infiltrated with fat but otherwise unremarkable. Spleen: Normal in size without focal abnormality. Adrenals/Urinary Tract: Adrenal glands appear normal. Kidneys are unremarkable without mass, stone or hydronephrosis. No ureteral or bladder calculi are identified. Bladder is unremarkable. Stomach/Bowel: No dilated large or small bowel loops. No evidence of bowel wall inflammation. Stomach is unremarkable, partially decompressed. Vascular/Lymphatic: No significant vascular findings are present. No enlarged abdominal or pelvic lymph  nodes. Reproductive: Uterus and bilateral adnexa are unremarkable. Other: No free fluid or abscess collection is seen. No free intraperitoneal air. Musculoskeletal: Degenerative spondylosis of the thoracolumbar spine, mild to moderate in degree. Associated disc bulge at L4-5 is causing severe central canal stenosis with suspected associated nerve root impingement. Reconstruction images of the pelvis are provided to include the upper femurs bilaterally. There is a benign osteochondroma exophytic to the proximal shaft of the RIGHT femur. No acute or suspicious osseous abnormality. No fracture is seen at the LEFT hip or within the proximal LEFT femur. Mild degenerative spurring at the LEFT hip. IMPRESSION: 1. No acute findings within the abdomen or pelvis. No bowel obstruction or evidence of bowel wall inflammation. No evidence of diverticulitis. No evidence of acute solid organ abnormality. No renal or ureteral calculi. 2. Degenerative spondylosis of the thoracolumbar spine, most significant in the lower lumbar spine. Associated large disc bulge at L4-5 is causing severe central canal stenosis. If symptoms could be radiculopathic symptoms, would consider nonemergent lumbar spine MRI to exclude associated nerve root impingement. 3. No fracture within the osseous pelvis or at the LEFT hip. Proximal LEFT femur is intact and normally aligned. Mild degenerative change at the LEFT hip. Electronically Signed   By: SFranki CabotM.D.   On: 12/18/2020 16:52   DG Hip Unilat W or Wo Pelvis 2-3 Views Left  Result Date: 12/18/2020 CLINICAL DATA:  LEFT hip pain, no  known injury. EXAM: DG HIP (WITH OR WITHOUT PELVIS) 2-3V LEFT COMPARISON:  Plain film of the pelvis dated 09/26/2006 FINDINGS: Single-view of the pelvis and two views of the LEFT hip are provided. Osseous alignment appears normal. No fracture line or displaced fracture fragment is seen. Mild degenerative spurring at the bilateral hip joints. IMPRESSION: 1. No acute  findings. 2. Mild degenerative change. Electronically Signed   By: Franki Cabot M.D.   On: 12/18/2020 11:52        Scheduled Meds: . mometasone-formoterol  2 puff Inhalation BID  . montelukast  10 mg Oral q morning   Continuous Infusions: . sodium chloride 75 mL/hr at 12/19/20 1320     LOS: 0 days    Time spent: 35 minutes    Dana Allan, MD  [facility] Pager #: [phone] 7PM-7AM contact night coverage as above

## 2020-12-19 NOTE — ED Notes (Signed)
Report given to Lars Mage, Therapist, sports at [facility]. All questions answered.

## 2020-12-20 ENCOUNTER — Inpatient Hospital Stay (HOSPITAL_COMMUNITY): Payer: PRIVATE HEALTH INSURANCE

## 2020-12-20 ENCOUNTER — Encounter (HOSPITAL_COMMUNITY): Payer: Self-pay | Admitting: Internal Medicine

## 2020-12-20 DIAGNOSIS — R011 Cardiac murmur, unspecified: Secondary | ICD-10-CM

## 2020-12-20 DIAGNOSIS — B9562 Methicillin resistant Staphylococcus aureus infection as the cause of diseases classified elsewhere: Secondary | ICD-10-CM

## 2020-12-20 DIAGNOSIS — R7881 Bacteremia: Secondary | ICD-10-CM

## 2020-12-20 LAB — BLOOD CULTURE ID PANEL (REFLEXED) - BCID2

## 2020-12-20 LAB — CULTURE, BLOOD (ROUTINE X 2): Culture: NO GROWTH

## 2020-12-20 MED ORDER — FENTANYL CITRATE (PF) 100 MCG/2ML IJ SOLN
INTRAMUSCULAR | Status: AC | PRN
  Administered 2020-12-20 (×2): 50 ug via INTRAVENOUS

## 2020-12-20 MED ORDER — MIDAZOLAM HCL 2 MG/2ML IJ SOLN
INTRAMUSCULAR | Status: AC | PRN
  Administered 2020-12-20 (×4): 1 mg via INTRAVENOUS

## 2020-12-20 MED ORDER — VANCOMYCIN HCL 1500 MG/300ML IV SOLN
1500.0000 mg | Freq: Two times a day (BID) | INTRAVENOUS | Status: DC
  Administered 2020-12-20 – 2020-12-23 (×7): 1500 mg via INTRAVENOUS
  Filled 2020-12-20 (×8): qty 300

## 2020-12-20 MED ORDER — VANCOMYCIN HCL 2000 MG/400ML IV SOLN
2000.0000 mg | Freq: Once | INTRAVENOUS | Status: AC
  Administered 2020-12-20: 2000 mg via INTRAVENOUS
  Filled 2020-12-20: qty 400

## 2020-12-20 MED ORDER — SODIUM CHLORIDE 0.9 % IV SOLN
INTRAVENOUS | Status: AC | PRN
  Administered 2020-12-20: 10 mL/h via INTRAVENOUS

## 2020-12-20 MED ORDER — FENTANYL CITRATE (PF) 100 MCG/2ML IJ SOLN
INTRAMUSCULAR | Status: AC
  Filled 2020-12-20: qty 4

## 2020-12-20 MED ORDER — LIDOCAINE HCL (PF) 1 % IJ SOLN
INTRAMUSCULAR | Status: AC | PRN
  Administered 2020-12-20: 10 mL via INTRADERMAL

## 2020-12-20 MED ORDER — SODIUM CHLORIDE 0.9 % IV SOLN
INTRAVENOUS | Status: AC
  Filled 2020-12-20: qty 250

## 2020-12-20 MED ORDER — NALOXONE HCL 0.4 MG/ML IJ SOLN
INTRAMUSCULAR | Status: AC
  Filled 2020-12-20: qty 1

## 2020-12-20 MED ORDER — MIDAZOLAM HCL 2 MG/2ML IJ SOLN
INTRAMUSCULAR | Status: AC
  Filled 2020-12-20: qty 4

## 2020-12-20 MED ORDER — FLUMAZENIL 0.5 MG/5ML IV SOLN
INTRAVENOUS | Status: AC
  Filled 2020-12-20: qty 5

## 2020-12-20 NOTE — ED Notes (Signed)
Elmo Putt, RN called regarding blood cx results.

## 2020-12-20 NOTE — Progress Notes (Signed)
   12/20/20 0505  Assess: MEWS Score  Temp (!) 102.4 F (39.1 C)  BP (!) 155/72  Pulse Rate 83  Resp 20  SpO2 97 %  O2 Device Nasal Cannula  Patient Activity (if Appropriate) In bed  O2 Flow Rate (L/min) 1 L/min  Assess: MEWS Score  MEWS Temp 2  MEWS Systolic 0  MEWS Pulse 0  MEWS RR 0  MEWS LOC 0  MEWS Score 2  MEWS Score Color Yellow  Assess: if the MEWS score is Yellow or Red  Were vital signs taken at a resting state? Yes  Focused Assessment Change from prior assessment (see assessment flowsheet) (Patient now has a temp of 102.4)  Early Detection of Sepsis Score *See Row Information* Low  MEWS guidelines implemented *See Row Information* No, previously yellow, continue vital signs every 4 hours  Treat  MEWS Interventions Administered prn meds/treatments  Take Vital Signs  Increase Vital Sign Frequency  Yellow: Q 2hr X 2 then Q 4hr X 2, if remains yellow, continue Q 4hrs  Escalate  MEWS: Escalate Yellow: discuss with charge nurse/RN and consider discussing with provider and RRT  Notify: Charge Nurse/RN  Name of Charge Nurse/RN Notified Trenda Moots, RN  Date Charge Nurse/RN Notified 12/20/20  Time Charge Nurse/RN Notified [phone]

## 2020-12-20 NOTE — Progress Notes (Signed)
   12/20/20 4481  Provider Notification  Provider Name/Title Ander Slade, MD  Date Provider Notified 12/20/20  Time Provider Notified [phone]  Notification Type Page  Notification Reason Other (Comment) (RN from [facility] called with blood culture results that were positive, alerting provider.)   This RN received a call from [place] RN, reporting that patient Blood cultures were reported to her. Patient's reported results contained positive blood cultures. See Lab results. This RN notified the on call provider of this information.

## 2020-12-20 NOTE — Consult Note (Signed)
Chief Complaint: Patient was seen in consultation today for image guided aspiration of left sacroiliac joint  Chief Complaint  Patient presents with  . Hip Pain    Referring Physician(s): Chalco  Supervising Physician: Corrie Mckusick  Patient Status: [facility] - In-pt  History of Present Illness: Nicole Hines is a 60 y.o. female  with history of chronic knee pain, and asthma who presented to the emergency department at [facility] with complaint of worsening left hip pain, inability to ambulate due to pain.  In the emergency department she was found to have fever, lab work showed leukocytosis, blood cultures positive for MRSA.  MRI L-spine revealed:  1. Inflammatory signal in the left lower erector spinae muscle at L5 and S1, and adjacent to the left L5 transverse process is nonspecific but more resembles degenerative or posttraumatic muscle edema than infection. No paraspinal fluid collection. No evidence of discitis osteomyelitis.  2. L4-L5 moderate to severe spinal, at least moderate lateral recess, and up to moderate bilateral neural foraminal stenosis related to disc and posterior element degeneration in the setting of grade 1 spondylolisthesis.  3. Mild L5-S1 spondylolisthesis with up to moderate bilateral L5 foraminal stenosis.  4. L2-L3 disc bulging with moderate bilateral L2 neural foraminal stenosis.  MRI left hip revealed:   1. Small left SI joint effusion with adjacent soft tissue edema anteriorly and posteriorly in the adjacent multifidus muscle concerning for septic arthritis of the left SI joint. 2. Moderate tendinosis of the left gluteus medius tendon insertion. Mild tendinosis of the left gluteus minimus tendon insertion. Partial-thickness tear of the right gluteus medius tendon insertion. 3. Mild tendinosis of the left hamstring origin with a partial-thickness tear. Small partial-thickness tear of the right hamstring origin. 4.  Mild osteoarthritis of the left hip.  Current temp 99.1, WBC 15.5, hemoglobin 12.1, platelets 191k, creat 0.59; request now received for image guided aspiration of the left SI joint  Past Medical History:  Diagnosis Date  . Amenorrhea    irreg cycle  . Anxiety   . Arthritis   . Asthma   . MRSA infection   . Obesity     Past Surgical History:  Procedure Laterality Date  . COLONOSCOPY WITH PROPOFOL N/A 01/08/2014   Procedure: COLONOSCOPY WITH PROPOFOL;  Surgeon: [name], MD;  Location: [facility] END[facility];  Service: Endoscopy;  Laterality: N/A;  . DILATION AND CURETTAGE OF UTERUS  1990  . LAPAROSCOPIC CHOLECYSTECTOMY  1994  . mrsa     had to have area cut out. abdomen    Allergies: Patient has no known allergies.  Medications: Prior to Admission medications   Medication Sig Start Date End Date Taking? Authorizing Provider  ALPRAZolam Duanne Moron) 0.5 MG tablet Take 1 mg by mouth at bedtime as needed for sleep. For anxiety   Yes [provider]  Calcium Carbonate-Vitamin D (CALCIUM 600+D3 PO) Take 1 tablet by mouth 2 (two) times daily.   Yes [provider]  citalopram (CELEXA) 40 MG tablet Take 40 mg by mouth every morning.   Yes [provider]  clobetasol cream (TEMOVATE) 3.09 % Apply 1 application topically at bedtime. 10/18/20  Yes [provider]  fish oil-omega-3 fatty acids 1000 MG capsule Take 1 g by mouth 2 (two) times daily.   Yes [provider]  gabapentin (NEURONTIN) 100 MG capsule Take 300 mg by mouth at bedtime. 11/11/20  Yes [provider]  Glucosamine-Chondroit-Vit C-Mn (GLUCOSAMINE 1500 COMPLEX PO) Take 1 capsule by  mouth 2 (two) times daily.   Yes [provider]  meloxicam (MOBIC) 15 MG tablet Take 15 mg by mouth daily.   Yes [provider]  methocarbamol (ROBAXIN) 750 MG tablet Take 750 mg by mouth at bedtime.   Yes [provider]  montelukast (SINGULAIR) 10 MG tablet Take 10 mg by  mouth every morning.  10/19/13  Yes [provider]  Multiple Vitamin (MULTIVITAMIN WITH MINERALS) TABS tablet Take 1 tablet by mouth daily.   Yes [provider]  omeprazole (PRILOSEC) 20 MG capsule Take 1 capsule by mouth daily. 11/20/20  Yes [provider]  oxyCODONE-acetaminophen (PERCOCET) 10-325 MG per tablet Take 1 tablet by mouth every 6 (six) hours as needed for pain.  10/25/13  Yes [provider]  vitamin B-12 (CYANOCOBALAMIN) 1000 MCG tablet Take 1,000 mcg by mouth daily.   Yes [provider]  Fluticasone-Salmeterol (ADVAIR) 250-50 MCG/DOSE AEPB Inhale 2 puffs into the lungs 2 (two) times daily as needed (sob/wheezing).    [provider]     Family History  Problem Relation Age of Onset  . Cancer Mother        pancreatic  . Diabetes Mother   . Hypertension Mother   . Heart attack Father   . Heart attack Maternal Grandmother   . Diabetes Brother   . Cancer Maternal Aunt        uterine  . Diabetes Maternal Aunt   . Colon cancer Neg Hx     Social History   Socioeconomic History  . Marital status: Divorced    Spouse name: Not on file  . Number of children: Not on file  . Years of education: Not on file  . Highest education level: Not on file  Occupational History  . Not on file  Tobacco Use  . Smoking status: Never Smoker  . Smokeless tobacco: Never Used  Substance and Sexual Activity  . Alcohol use: No  . Drug use: No  . Sexual activity: Yes    Partners: Female    Birth control/protection: Surgical    Comment: btl  Other Topics Concern  . Not on file  Social History Narrative  . Not on file   Social Determinants of Health   Financial Resource Strain: Not on file  Food Insecurity: Not on file  Transportation Needs: Not on file  Physical Activity: Not on file  Stress: Not on file  Social Connections: Not on file      Review of Systems denies chest pain, worsening dyspnea, cough, abdominal pain,  nausea, vomiting or bleeding.  She has had recent fevers, occasional headaches, chronic knee pain, occ back pain and left hip pain.  Vital Signs: BP 120/61 (BP Location: Left Arm)   Pulse (!) 56   Temp 99.1 F (37.3 C) (Oral)   Resp 20   Ht 5' 7"  (1.702 m)   Wt 234 lb 2.1 oz (106.2 kg)   LMP 02/18/2014   SpO2 96%   BMI 36.67 kg/m   Physical Exam awake, alert.  Chest clear to auscultation bilaterally.  Heart with regular rate rhythm, positive murmur.  Abdomen obese, soft, positive bowel sounds, nontender.  Left hip pain noted, worse with movement.  No lower extremity edema.  Imaging: MR LUMBAR SPINE WO CONTRAST  Result Date: 12/19/2020 CLINICAL DATA:  60 year old female with low back pain. Evidence of L4-L5 spinal stenosis on CT Abdomen and Pelvis yesterday. Possible infection. EXAM: MRI LUMBAR SPINE WITHOUT CONTRAST TECHNIQUE: Multiplanar, multisequence MR  imaging of the lumbar spine was performed. No intravenous contrast was administered. COMPARISON:  CT Abdomen and Pelvis 12/18/2020. Lumbar MRI 12/11/2006. FINDINGS: Segmentation:  Normal. Alignment: Mildly increased lumbar lordosis since 2008. Mild grade 1 anterolisthesis of L4 on L5, 2-3 mm is new since that time. Similar subtle anterolisthesis of L5 on S1. Vertebrae: No marrow edema or evidence of acute osseous abnormality. Visualized bone marrow signal is within normal limits. Intact visible sacrum and SI joints. Conus medullaris and cauda equina: Conus extends to the T12-L1 level. No lower spinal cord or conus signal abnormality. Paraspinal and other soft tissues: Visible abdominal and pelvic viscera appear stable since yesterday. There is streaky inflammatory signal in the left lower lumbar erector spinae muscles on series 7 images 13 and 17, more pronounced at the L5 transverse process level. Negative lumbar paraspinal soft tissues otherwise. Disc levels: T11-T12: Negative. T12-L1:  Mild disc bulging.  No stenosis. L1-L2: Negative disc.  Mild facet hypertrophy. Mild degenerative increased STIR signal in the interspinous ligament. No stenosis. L2-L3: Circumferential, mostly foraminal disc bulging. Mild to moderate facet hypertrophy greater on the left. Trace degenerative facet and interspinous ligament fluid signal. No spinal stenosis. Moderate bilateral L2 foraminal stenosis. L3-L4: Mild disc bulging and facet hypertrophy. Mild left L3 foraminal stenosis. L4-L5: Mild anterolisthesis with circumferential disc/pseudo disc. Broad-based posterior component. Moderate ligament flavum and moderate to severe facet hypertrophy. Moderate to severe spinal stenosis. At least moderate bilateral lateral recess stenosis (L5 nerve levels). Mild to moderate left L4 neural foraminal stenosis. L5-S1: Subtle anterolisthesis and disc bulging. Moderate facet hypertrophy. No spinal or lateral recess stenosis. Mild to moderate bilateral L5 foraminal stenosis. IMPRESSION: 1. Inflammatory signal in the left lower erector spinae muscle at L5 and S1, and adjacent to the left L5 transverse process is nonspecific but more resembles degenerative or posttraumatic muscle edema than infection. No paraspinal fluid collection. No evidence of discitis osteomyelitis. 2. L4-L5 moderate to severe spinal, at least moderate lateral recess, and up to moderate bilateral neural foraminal stenosis related to disc and posterior element degeneration in the setting of grade 1 spondylolisthesis. 3. Mild L5-S1 spondylolisthesis with up to moderate bilateral L5 foraminal stenosis. 4. L2-L3 disc bulging with moderate bilateral L2 neural foraminal stenosis. Electronically Signed   By: Genevie Ann M.D.   On: 12/19/2020 10:58   CT ABDOMEN PELVIS W CONTRAST  Result Date: 12/18/2020 CLINICAL DATA:  LEFT lower quadrant abdominal pain and LEFT hip pain. Concern for diverticulitis. Concern for adult hip fracture. EXAM: CT ABDOMEN AND PELVIS WITH CONTRAST RECONSTRUCTED IMAGES OF THE PELVIS TECHNIQUE:  Multidetector CT imaging of the abdomen and pelvis was performed using the standard protocol following bolus administration of intravenous contrast. Reconstructions performed of the osseous pelvis. CONTRAST:  127m OMNIPAQUE IOHEXOL 300 MG/ML  SOLN COMPARISON:  CT abdomen dated 03/31/2006. FINDINGS: Lower chest: No acute abnormality. Hepatobiliary: No focal liver abnormality is seen. Status post cholecystectomy with associated mild bile duct ectasia. Pancreas: Partially infiltrated with fat but otherwise unremarkable. Spleen: Normal in size without focal abnormality. Adrenals/Urinary Tract: Adrenal glands appear normal. Kidneys are unremarkable without mass, stone or hydronephrosis. No ureteral or bladder calculi are identified. Bladder is unremarkable. Stomach/Bowel: No dilated large or small bowel loops. No evidence of bowel wall inflammation. Stomach is unremarkable, partially decompressed. Vascular/Lymphatic: No significant vascular findings are present. No enlarged abdominal or pelvic lymph nodes. Reproductive: Uterus and bilateral adnexa are unremarkable. Other: No free fluid or abscess collection is seen. No free intraperitoneal air. Musculoskeletal: Degenerative spondylosis  of the thoracolumbar spine, mild to moderate in degree. Associated disc bulge at L4-5 is causing severe central canal stenosis with suspected associated nerve root impingement. Reconstruction images of the pelvis are provided to include the upper femurs bilaterally. There is a benign osteochondroma exophytic to the proximal shaft of the RIGHT femur. No acute or suspicious osseous abnormality. No fracture is seen at the LEFT hip or within the proximal LEFT femur. Mild degenerative spurring at the LEFT hip. IMPRESSION: 1. No acute findings within the abdomen or pelvis. No bowel obstruction or evidence of bowel wall inflammation. No evidence of diverticulitis. No evidence of acute solid organ abnormality. No renal or ureteral calculi. 2.  Degenerative spondylosis of the thoracolumbar spine, most significant in the lower lumbar spine. Associated large disc bulge at L4-5 is causing severe central canal stenosis. If symptoms could be radiculopathic symptoms, would consider nonemergent lumbar spine MRI to exclude associated nerve root impingement. 3. No fracture within the osseous pelvis or at the LEFT hip. Proximal LEFT femur is intact and normally aligned. Mild degenerative change at the LEFT hip. Electronically Signed   By: Franki Cabot M.D.   On: 12/18/2020 16:52   MR HIP LEFT WO CONTRAST  Result Date: 12/19/2020 CLINICAL DATA:  Soft tissue infection. EXAM: MR OF THE LEFT HIP WITHOUT CONTRAST TECHNIQUE: Multiplanar, multisequence MR imaging was performed. No intravenous contrast was administered. COMPARISON:  None. FINDINGS: Bones: No hip fracture, dislocation or avascular necrosis. No periosteal reaction or bone destruction. No aggressive osseous lesion. Small left SI joint effusion with adjacent soft tissue edema anteriorly and posteriorly in the adjacent multifidus muscle concerning for septic arthritis of the left SI joint. 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 Articular cartilage and labrum Articular cartilage: Partial-thickness cartilage loss of the left femoral head and acetabulum. Labrum: Limited evaluation secondary lack of intra-articular contrast. Degeneration of the left superior labrum. Joint or bursal effusion Joint effusion:  No hip joint effusion. Bursae:  No bursa formation. Muscles and tendons Flexors: Normal. Extensors: Normal. Abductors: Normal. Adductors: Normal. Gluteals: Moderate tendinosis of the left gluteus medius tendon insertion. Mild tendinosis of the left gluteus minimus tendon insertion. Partial-thickness tear of the right gluteus medius tendon insertion. Hamstrings: Mild tendinosis of the left hamstring origin with a partial-thickness tear. Small partial-thickness tear of the right hamstring origin. Other  findings Small amount of pelvic free fluid. No fluid collection or hematoma. No inguinal lymphadenopathy. No inguinal hernia. IMPRESSION: 1. Small left SI joint effusion with adjacent soft tissue edema anteriorly and posteriorly in the adjacent multifidus muscle concerning for septic arthritis of the left SI joint. 2. Moderate tendinosis of the left gluteus medius tendon insertion. Mild tendinosis of the left gluteus minimus tendon insertion. Partial-thickness tear of the right gluteus medius tendon insertion. 3. Mild tendinosis of the left hamstring origin with a partial-thickness tear. Small partial-thickness tear of the right hamstring origin. 4. Mild osteoarthritis of the left hip. Electronically Signed   By: Kathreen Devoid   On: 12/19/2020 10:49   US Venous Img Lower  Left (DVT Study)  Result Date: 12/18/2020 CLINICAL DATA:  60 year old with left hip and buttock pain. EXAM: LEFT LOWER EXTREMITY VENOUS DOPPLER ULTRASOUND TECHNIQUE: Gray-scale sonography with graded compression, as well as color Doppler and duplex ultrasound were performed to evaluate the lower extremity deep venous systems from the level of the common femoral vein and including the common femoral, femoral, profunda femoral, popliteal and calf veins including the posterior tibial, peroneal and gastrocnemius veins when  visible. The superficial great saphenous vein was also interrogated. Spectral Doppler was utilized to evaluate flow at rest and with distal augmentation maneuvers in the common femoral, femoral and popliteal veins. COMPARISON:  None. FINDINGS: Contralateral Common Femoral Vein: Respiratory phasicity is normal and symmetric with the symptomatic side. No evidence of thrombus. Normal compressibility. Common Femoral Vein: No evidence of thrombus. Normal compressibility, respiratory phasicity and response to augmentation. Saphenofemoral Junction: No evidence of thrombus. Normal compressibility and flow on color Doppler imaging.  Profunda Femoral Vein: No evidence of thrombus. Normal compressibility and flow on color Doppler imaging. Femoral Vein: No evidence of thrombus. Normal compressibility, respiratory phasicity and response to augmentation. Popliteal Vein: No evidence of thrombus. Normal compressibility, respiratory phasicity and response to augmentation. Calf Veins: No evidence of thrombus. Normal compressibility and flow on color Doppler imaging. Superficial Great Saphenous Vein: No evidence of thrombus. Normal compressibility. Other Findings:  None. IMPRESSION: Negative for deep venous thrombosis in left lower extremity. Electronically Signed   By: Markus Daft M.D.   On: 12/18/2020 11:49   DG Chest Portable 1 View  Result Date: 12/18/2020 CLINICAL DATA:  Fever EXAM: PORTABLE CHEST 1 VIEW COMPARISON:  None. FINDINGS: Borderline cardiomegaly. Lungs are clear. No pleural effusion or pneumothorax is seen. Osseous structures about the chest are unremarkable. IMPRESSION: No active disease. No evidence of pneumonia or pulmonary edema. Borderline cardiomegaly. Electronically Signed   By: Franki Cabot M.D.   On: 12/18/2020 20:07   CT NO CHARGE  Result Date: 12/18/2020 CLINICAL DATA:  LEFT lower quadrant abdominal pain and LEFT hip pain. Concern for diverticulitis. Concern for adult hip fracture. EXAM: CT ABDOMEN AND PELVIS WITH CONTRAST RECONSTRUCTED IMAGES OF THE PELVIS TECHNIQUE: Multidetector CT imaging of the abdomen and pelvis was performed using the standard protocol following bolus administration of intravenous contrast. Reconstructions performed of the osseous pelvis. CONTRAST:  142m OMNIPAQUE IOHEXOL 300 MG/ML  SOLN COMPARISON:  CT abdomen dated 03/31/2006. FINDINGS: Lower chest: No acute abnormality. Hepatobiliary: No focal liver abnormality is seen. Status post cholecystectomy with associated mild bile duct ectasia. Pancreas: Partially infiltrated with fat but otherwise unremarkable. Spleen: Normal in size without focal  abnormality. Adrenals/Urinary Tract: Adrenal glands appear normal. Kidneys are unremarkable without mass, stone or hydronephrosis. No ureteral or bladder calculi are identified. Bladder is unremarkable. Stomach/Bowel: No dilated large or small bowel loops. No evidence of bowel wall inflammation. Stomach is unremarkable, partially decompressed. Vascular/Lymphatic: No significant vascular findings are present. No enlarged abdominal or pelvic lymph nodes. Reproductive: Uterus and bilateral adnexa are unremarkable. Other: No free fluid or abscess collection is seen. No free intraperitoneal air. Musculoskeletal: Degenerative spondylosis of the thoracolumbar spine, mild to moderate in degree. Associated disc bulge at L4-5 is causing severe central canal stenosis with suspected associated nerve root impingement. Reconstruction images of the pelvis are provided to include the upper femurs bilaterally. There is a benign osteochondroma exophytic to the proximal shaft of the RIGHT femur. No acute or suspicious osseous abnormality. No fracture is seen at the LEFT hip or within the proximal LEFT femur. Mild degenerative spurring at the LEFT hip. IMPRESSION: 1. No acute findings within the abdomen or pelvis. No bowel obstruction or evidence of bowel wall inflammation. No evidence of diverticulitis. No evidence of acute solid organ abnormality. No renal or ureteral calculi. 2. Degenerative spondylosis of the thoracolumbar spine, most significant in the lower lumbar spine. Associated large disc bulge at L4-5 is causing severe central canal stenosis. If symptoms could be radiculopathic symptoms, would consider  nonemergent lumbar spine MRI to exclude associated nerve root impingement. 3. No fracture within the osseous pelvis or at the LEFT hip. Proximal LEFT femur is intact and normally aligned. Mild degenerative change at the LEFT hip. Electronically Signed   By: Franki Cabot M.D.   On: 12/18/2020 16:52   DG Hip Unilat W or Wo  Pelvis 2-3 Views Left  Result Date: 12/18/2020 CLINICAL DATA:  LEFT hip pain, no known injury. EXAM: DG HIP (WITH OR WITHOUT PELVIS) 2-3V LEFT COMPARISON:  Plain film of the pelvis dated 09/26/2006 FINDINGS: Single-view of the pelvis and two views of the LEFT hip are provided. Osseous alignment appears normal. No fracture line or displaced fracture fragment is seen. Mild degenerative spurring at the bilateral hip joints. IMPRESSION: 1. No acute findings. 2. Mild degenerative change. Electronically Signed   By: Franki Cabot M.D.   On: 12/18/2020 11:52    Labs:  CBC: Recent Labs    12/18/20 1105 12/19/20 0751  WBC 16.0* 15.5*  HGB 12.5 12.1  HCT 37.9 36.7  PLT 205 191    COAGS: No results for input(s): INR, APTT in the last 8760 hours.  BMP: Recent Labs    12/18/20 1105 12/19/20 0751  NA 137 139  K 3.1* 4.0  CL 101 104  CO2 27 26  GLUCOSE 158* 185*  BUN 15 17  CALCIUM 9.7 9.7  CREATININE 0.54 0.59  GFRNONAA >60 >60    LIVER FUNCTION TESTS: Recent Labs    12/18/20 1105 12/19/20 0751  BILITOT 0.9 0.8  AST 15 25  ALT 16 25  ALKPHOS 63 69  PROT 7.6 8.0  ALBUMIN 4.2 3.4*    TUMOR MARKERS: No results for input(s): AFPTM, CEA, CA199, CHROMGRNA in the last 8760 hours.  Assessment and Plan: 60 y.o. female  with history of chronic knee pain, and asthma who presented to the emergency department at [facility] with complaint of worsening left hip pain, inability to ambulate due to pain.  In the emergency department she was found to have fever, lab work showed leukocytosis, blood cultures positive for MRSA.  MRI L-spine revealed:  1. Inflammatory signal in the left lower erector spinae muscle at L5 and S1, and adjacent to the left L5 transverse process is nonspecific but more resembles degenerative or posttraumatic muscle edema than infection. No paraspinal fluid collection. No evidence of discitis osteomyelitis.  2. L4-L5 moderate to severe spinal, at least  moderate lateral recess, and up to moderate bilateral neural foraminal stenosis related to disc and posterior element degeneration in the setting of grade 1 spondylolisthesis.  3. Mild L5-S1 spondylolisthesis with up to moderate bilateral L5 foraminal stenosis.  4. L2-L3 disc bulging with moderate bilateral L2 neural foraminal stenosis.  MRI left hip revealed:   1. Small left SI joint effusion with adjacent soft tissue edema anteriorly and posteriorly in the adjacent multifidus muscle concerning for septic arthritis of the left SI joint. 2. Moderate tendinosis of the left gluteus medius tendon insertion. Mild tendinosis of the left gluteus minimus tendon insertion. Partial-thickness tear of the right gluteus medius tendon insertion. 3. Mild tendinosis of the left hamstring origin with a partial-thickness tear. Small partial-thickness tear of the right hamstring origin. 4. Mild osteoarthritis of the left hip.  Current temp 99.1, WBC 15.5, hemoglobin 12.1, platelets 191k, creat 0.59; request now received for image guided aspiration of the left SI joint.  Imaging studies were reviewed by Dr. Earleen Newport.  Details/risks of procedure, including but not  limited to, internal bleeding, infection, injury to adjacent structures discussed with patient with her understanding and consent.  Procedure scheduled for this morning.   Thank you for this interesting consult.  I greatly enjoyed meeting Nicole Hines and look forward to participating in their care.  A copy of this report was sent to the requesting provider on this date.  Electronically Signed: D. Rowe Robert, PA-C 12/20/2020, 9:05 AM   I spent a total of 20 minutes in face to face in clinical consultation, greater than 50% of which was counseling/coordinating care for image guided left sacroiliac joint aspiration

## 2020-12-20 NOTE — Progress Notes (Signed)
PROGRESS NOTE    Nicole Hines  BTY:[id number] DOB: 1961-05-23 DOA: 12/18/2020 PCP: [name], MD   Chief Complain: Left hip pain  Brief Narrative: Patient is a 60 year old female with history of chronic knee pain, asthma who presented to the emergency department at [facility] with complaint of worsening left hip pain, inability to ambulate due to pain.  In the emergency department she was found to have fever, lab work showed leukocytosis.  MRI revealed left septic sacroiliac joint.  Orthopedics, IR , ID consulted.  Plan for CT-guided aspiration of left today.  Blood culture shows MRSA,on vancomycin.  Assessment & Plan:   Principal Problem:   SIRS (systemic inflammatory response syndrome) (HCC) Active Problems:   Left hip pain   Septic joint (HCC)   Left septic sacroiliac joint: Presented with fever, left hip pain.  MRI of the lumbar spine showed left septic sacroiliac joint.  IR, orthopedics, ID consulted. Follow-up blood cultures, joint fluid culture.  Patient on vancomycin  MRSA bacteremia: Presented with fever, leukocytosis.  Source is left hip.  We will follow-up cultures.  Currently on vancomycin.  Follow final culture and sensitivity. She will need TTE/TEE.  She has a pansystolic murmur  Chronic pain syndrome: Continue supportive care, pain medications  History of asthma: Currently saturating fine on room air.  No wheezing  Debility/deconditioning/left hip pain: We will consult PT/OT when appropriate.  Morbid obesity: BMI of 36.6           DVT prophylaxis:SCD Code Status: Full Family Communication: None at bedside Status is: Inpatient  Remains inpatient appropriate because:Inpatient level of care appropriate due to severity of illness   Dispo: The patient is from: Home              Anticipated d/c is to: Home              Patient currently is not medically stable to d/c.   Difficult to place patient No      Consultants: ID, orthopedics,  IR  Procedures:  Antimicrobials:  Anti-infectives (From admission, onward)   Start     Dose/Rate Route Frequency Ordered Stop   12/20/20 1900  vancomycin (VANCOREADY) IVPB 1500 mg/300 mL        1,500 mg 150 mL/hr over 120 Minutes Intravenous Every 12 hours 12/20/20 0613     12/20/20 0645  vancomycin (VANCOREADY) IVPB 2000 mg/400 mL        2,000 mg 200 mL/hr over 120 Minutes Intravenous  Once 12/20/20 0554        Subjective: Patient seen and examined at the bedside this morning.  Hemodynamically stable.  Overall comfortable.  Complains of left hip pain and inability to walk.  Febrile this morning but afebrile during my evaluation.  Objective: Vitals:   12/20/20 0100 12/20/20 0505 12/20/20 0647 12/20/20 0751  BP: (!) 156/78 (!) 155/72  120/61  Pulse:  83  (!) 56  Resp:  20  20  Temp:  (!) 102.4 F (39.1 C) 98.4 F (36.9 C) 99.1 F (37.3 C)  TempSrc:  Oral Oral Oral  SpO2:  97%  96%  Weight:      Height:        Intake/Output Summary (Last 24 hours) at 12/20/2020 0833 Last data filed at 12/20/2020 0634 Gross per 24 hour  Intake 1620 ml  Output 700 ml  Net 920 ml   Filed Weights   12/18/20 1027 12/19/20 0236  Weight: 113.4 kg 106.2 kg  Examination:  General exam:Not in distress, morbidly obese HEENT:PERRL,Oral mucosa moist, Ear/Nose normal on gross exam Respiratory system: Bilateral equal air entry, normal vesicular breath sounds, no wheezes or crackles  Cardiovascular system: S1 & S2 heard, RRR.  Pansystolic murmur. no pedal edema. Gastrointestinal system: Abdomen is nondistended, soft and nontender. No organomegaly or masses felt. Normal bowel sounds heard. Central nervous system: Alert and oriented. No focal neurological deficits. Extremities: No edema, no clubbing ,no cyanosis, tenderness on the left sacroiliac area Skin: No rashes, lesions or ulcers,no icterus ,no pallor     Data Reviewed: I have personally reviewed following labs and imaging  studies  CBC: Recent Labs  Lab 12/18/20 1105 12/19/20 0751  WBC 16.0* 15.5*  NEUTROABS 13.6* 13.5*  HGB 12.5 12.1  HCT 37.9 36.7  MCV 92.7 93.9  PLT 205 779   Basic Metabolic Panel: Recent Labs  Lab 12/18/20 1105 12/19/20 0751  NA 137 139  K 3.1* 4.0  CL 101 104  CO2 27 26  GLUCOSE 158* 185*  BUN 15 17  CREATININE 0.54 0.59  CALCIUM 9.7 9.7   GFR: Estimated Creatinine Clearance: 94.9 mL/min (by C-G formula based on SCr of 0.59 mg/dL). Liver Function Tests: Recent Labs  Lab 12/18/20 1105 12/19/20 0751  AST 15 25  ALT 16 25  ALKPHOS 63 69  BILITOT 0.9 0.8  PROT 7.6 8.0  ALBUMIN 4.2 3.4*   No results for input(s): LIPASE, AMYLASE in the last 168 hours. No results for input(s): AMMONIA in the last 168 hours. Coagulation Profile: No results for input(s): INR, PROTIME in the last 168 hours. Cardiac Enzymes: No results for input(s): CKTOTAL, CKMB, CKMBINDEX, TROPONINI in the last 168 hours. BNP (last 3 results) No results for input(s): PROBNP in the last 8760 hours. HbA1C: No results for input(s): HGBA1C in the last 72 hours. CBG: No results for input(s): GLUCAP in the last 168 hours. Lipid Profile: No results for input(s): CHOL, HDL, LDLCALC, TRIG, CHOLHDL, LDLDIRECT in the last 72 hours. Thyroid Function Tests: No results for input(s): TSH, T4TOTAL, FREET4, T3FREE, THYROIDAB in the last 72 hours. Anemia Panel: No results for input(s): VITAMINB12, FOLATE, FERRITIN, TIBC, IRON, RETICCTPCT in the last 72 hours. Sepsis Labs: Recent Labs  Lab 12/18/20 2154  LATICACIDVEN 0.6    Recent Results (from the past 240 hour(s))  Resp Panel by RT-PCR (Flu A&B, Covid) Nasopharyngeal Swab     Status: None   Collection Time: 12/18/20  9:10 PM   Specimen: Nasopharyngeal Swab; Nasopharyngeal(NP) swabs in vial transport medium  Result Value Ref Range Status   SARS Coronavirus 2 by RT PCR NEGATIVE NEGATIVE Final    Comment: (NOTE) SARS-CoV-2 target nucleic acids are NOT  DETECTED.  The SARS-CoV-2 RNA is generally detectable in upper respiratory specimens during the acute phase of infection. The lowest concentration of SARS-CoV-2 viral copies this assay can detect is 138 copies/mL. A negative result does not preclude SARS-Cov-2 infection and should not be used as the sole basis for treatment or other patient management decisions. A negative result may occur with  improper specimen collection/handling, submission of specimen other than nasopharyngeal swab, presence of viral mutation(s) within the areas targeted by this assay, and inadequate number of viral copies(<138 copies/mL). A negative result must be combined with clinical observations, patient history, and epidemiological information. The expected result is Negative.  Fact Sheet for Patients:  EntrepreneurPulse.com.au  Fact Sheet for Healthcare Providers:  IncredibleEmployment.be  This test is no t yet approved or cleared by the  [place] [employer]ural [employer] and  has been authorized for detection and/or diagnosis of SARS-CoV-2 by FDA under an Print production planner (EUA). This EUA will remain  in effect (meaning this test can be used) for the duration of the COVID-19 declaration under Section 564(b)(1) of the Act, 21 U.S.C.section 360bbb-3(b)(1), unless the authorization is terminated  or revoked sooner.       Influenza A by PCR NEGATIVE NEGATIVE Final   Influenza B by PCR NEGATIVE NEGATIVE Final    Comment: (NOTE) The Xpert Xpress SARS-CoV-2/FLU/RSV plus assay is intended as an aid in the diagnosis of influenza from Nasopharyngeal swab specimens and should not be used as a sole basis for treatment. Nasal washings and aspirates are unacceptable for Xpert Xpress SARS-CoV-2/FLU/RSV testing.  Fact Sheet for Patients: EntrepreneurPulse.com.au  Fact Sheet for Healthcare Providers: IncredibleEmployment.be  This test is not yet  approved or cleared by the [place] FDA and has been authorized for detection and/or diagnosis of SARS-CoV-2 by FDA under an Emergency Use Authorization (EUA). This EUA will remain in effect (meaning this test can be used) for the duration of the COVID-19 declaration under Section 564(b)(1) of the Act, 21 U.S.C. section 360bbb-3(b)(1), unless the authorization is terminated or revoked.  Performed at [facility], [address] [place], [place] [address]   Culture, blood (routine x 2)     Status: None (Preliminary result)   Collection Time: 12/18/20 10:00 PM   Specimen: BLOOD  Result Value Ref Range Status   Specimen Description   Final    BLOOD RIGHT HAND Performed at [facility], [address] [place], [place] [address]    Special Requests   Final    BOTTLES DRAWN AEROBIC AND ANAEROBIC Blood Culture adequate volume Performed at [facility], [address], [place], [place] [address]    Culture  Setup Time   Final    GRAM POSITIVE COCCI IN BOTH AEROBIC AND ANAEROBIC BOTTLES CRITICAL RESULT CALLED TO, READ BACK BY AND VERIFIED WITH: VALERIE POWELL,RN 12/20/2020 AT 0522 A.HUGHES Performed at [facility], [place] [address], [place], [place] [address]    Culture PENDING  Incomplete   Report Status PENDING  Incomplete  Culture, blood (routine x 2)     Status: None (Preliminary result)   Collection Time: 12/18/20 10:00 PM   Specimen: BLOOD  Result Value Ref Range Status   Specimen Description   Final    BLOOD LEFT HAND Performed at [facility], [address], [place], [place] [address]    Special Requests   Final    BOTTLES DRAWN AEROBIC AND ANAEROBIC Blood Culture adequate volume Performed at [facility], [address] [place], [place] [address]    Culture  Setup Time   Final    GRAM POSITIVE COCCI IN BOTH AEROBIC AND ANAEROBIC BOTTLES Organism ID to  follow CRITICAL RESULT CALLED TO, READ BACK BY AND VERIFIED WITH: VALERIE POWELL,RN 12/20/2020 AT 0521 A.HUGHES Performed at [facility], [place] [address], [place],  [address]    Culture PENDING  Incomplete   Report Status PENDING  Incomplete  Blood Culture ID Panel (Reflexed)     Status: Abnormal   Collection Time: 12/18/20 10:00 PM  Result Value Ref Range Status   Enterococcus faecalis NOT DETECTED NOT DETECTED Final   Enterococcus Faecium NOT DETECTED NOT DETECTED Final   Listeria monocytogenes NOT DETECTED NOT DETECTED Final   Staphylococcus species DETECTED (A) NOT DETECTED Final  Comment: CRITICAL RESULT CALLED TO, READ BACK BY AND VERIFIED WITH: VALERIE POWELL,RN 12/20/2020 AT 0522 A.HUGHES    Staphylococcus aureus (BCID) DETECTED (A) NOT DETECTED Final    Comment: Methicillin (oxacillin)-resistant Staphylococcus aureus (MRSA). MRSA is predictably resistant to beta-lactam antibiotics (except ceftaroline). Preferred therapy is vancomycin unless clinically contraindicated. Patient requires contact precautions if  hospitalized. CRITICAL RESULT CALLED TO, READ BACK BY AND VERIFIED WITH: VALERIE POWELL,RN 12/20/2020 AT 0522 A.HUGHES    Staphylococcus epidermidis NOT DETECTED NOT DETECTED Final   Staphylococcus lugdunensis NOT DETECTED NOT DETECTED Final   Streptococcus species NOT DETECTED NOT DETECTED Final   Streptococcus agalactiae NOT DETECTED NOT DETECTED Final   Streptococcus pneumoniae NOT DETECTED NOT DETECTED Final   Streptococcus pyogenes NOT DETECTED NOT DETECTED Final   A.calcoaceticus-baumannii NOT DETECTED NOT DETECTED Final   Bacteroides fragilis NOT DETECTED NOT DETECTED Final   Enterobacterales NOT DETECTED NOT DETECTED Final   Enterobacter cloacae complex NOT DETECTED NOT DETECTED Final   Escherichia coli NOT DETECTED NOT DETECTED Final   Klebsiella aerogenes NOT DETECTED NOT DETECTED Final   Klebsiella oxytoca NOT DETECTED NOT DETECTED Final    Klebsiella pneumoniae NOT DETECTED NOT DETECTED Final   Proteus species NOT DETECTED NOT DETECTED Final   Salmonella species NOT DETECTED NOT DETECTED Final   Serratia marcescens NOT DETECTED NOT DETECTED Final   Haemophilus influenzae NOT DETECTED NOT DETECTED Final   Neisseria meningitidis NOT DETECTED NOT DETECTED Final   Pseudomonas aeruginosa NOT DETECTED NOT DETECTED Final   Stenotrophomonas maltophilia NOT DETECTED NOT DETECTED Final   Candida albicans NOT DETECTED NOT DETECTED Final   Candida auris NOT DETECTED NOT DETECTED Final   Candida glabrata NOT DETECTED NOT DETECTED Final   Candida krusei NOT DETECTED NOT DETECTED Final   Candida parapsilosis NOT DETECTED NOT DETECTED Final   Candida tropicalis NOT DETECTED NOT DETECTED Final   Cryptococcus neoformans/gattii NOT DETECTED NOT DETECTED Final   Meth resistant mecA/C and MREJ DETECTED (A) NOT DETECTED Final    Comment: CRITICAL RESULT CALLED TO, READ BACK BY AND VERIFIED WITH: VALERIE POWELL,RN 12/20/2020 AT 0522 A.HUGHES Performed at [facility], [place] [address], [place], [place] [address]          Radiology Studies: MR LUMBAR SPINE WO CONTRAST  Result Date: 12/19/2020 CLINICAL DATA:  60 year old female with low back pain. Evidence of L4-L5 spinal stenosis on CT Abdomen and Pelvis yesterday. Possible infection. EXAM: MRI LUMBAR SPINE WITHOUT CONTRAST TECHNIQUE: Multiplanar, multisequence MR imaging of the lumbar spine was performed. No intravenous contrast was administered. COMPARISON:  CT Abdomen and Pelvis 12/18/2020. Lumbar MRI 12/11/2006. FINDINGS: Segmentation:  Normal. Alignment: Mildly increased lumbar lordosis since 2008. Mild grade 1 anterolisthesis of L4 on L5, 2-3 mm is new since that time. Similar subtle anterolisthesis of L5 on S1. Vertebrae: No marrow edema or evidence of acute osseous abnormality. Visualized bone marrow signal is within normal limits. Intact visible sacrum and SI joints. Conus medullaris  and cauda equina: Conus extends to the T12-L1 level. No lower spinal cord or conus signal abnormality. Paraspinal and other soft tissues: Visible abdominal and pelvic viscera appear stable since yesterday. There is streaky inflammatory signal in the left lower lumbar erector spinae muscles on series 7 images 13 and 17, more pronounced at the L5 transverse process level. Negative lumbar paraspinal soft tissues otherwise. Disc levels: T11-T12: Negative. T12-L1:  Mild disc bulging.  No stenosis. L1-L2: Negative disc. Mild facet hypertrophy. Mild degenerative increased STIR signal in the interspinous ligament.  No stenosis. L2-L3: Circumferential, mostly foraminal disc bulging. Mild to moderate facet hypertrophy greater on the left. Trace degenerative facet and interspinous ligament fluid signal. No spinal stenosis. Moderate bilateral L2 foraminal stenosis. L3-L4: Mild disc bulging and facet hypertrophy. Mild left L3 foraminal stenosis. L4-L5: Mild anterolisthesis with circumferential disc/pseudo disc. Broad-based posterior component. Moderate ligament flavum and moderate to severe facet hypertrophy. Moderate to severe spinal stenosis. At least moderate bilateral lateral recess stenosis (L5 nerve levels). Mild to moderate left L4 neural foraminal stenosis. L5-S1: Subtle anterolisthesis and disc bulging. Moderate facet hypertrophy. No spinal or lateral recess stenosis. Mild to moderate bilateral L5 foraminal stenosis. IMPRESSION: 1. Inflammatory signal in the left lower erector spinae muscle at L5 and S1, and adjacent to the left L5 transverse process is nonspecific but more resembles degenerative or posttraumatic muscle edema than infection. No paraspinal fluid collection. No evidence of discitis osteomyelitis. 2. L4-L5 moderate to severe spinal, at least moderate lateral recess, and up to moderate bilateral neural foraminal stenosis related to disc and posterior element degeneration in the setting of grade 1  spondylolisthesis. 3. Mild L5-S1 spondylolisthesis with up to moderate bilateral L5 foraminal stenosis. 4. L2-L3 disc bulging with moderate bilateral L2 neural foraminal stenosis. Electronically Signed   By: Genevie Ann M.D.   On: 12/19/2020 10:58   CT ABDOMEN PELVIS W CONTRAST  Result Date: 12/18/2020 CLINICAL DATA:  LEFT lower quadrant abdominal pain and LEFT hip pain. Concern for diverticulitis. Concern for adult hip fracture. EXAM: CT ABDOMEN AND PELVIS WITH CONTRAST RECONSTRUCTED IMAGES OF THE PELVIS TECHNIQUE: Multidetector CT imaging of the abdomen and pelvis was performed using the standard protocol following bolus administration of intravenous contrast. Reconstructions performed of the osseous pelvis. CONTRAST:  16m OMNIPAQUE IOHEXOL 300 MG/ML  SOLN COMPARISON:  CT abdomen dated 03/31/2006. FINDINGS: Lower chest: No acute abnormality. Hepatobiliary: No focal liver abnormality is seen. Status post cholecystectomy with associated mild bile duct ectasia. Pancreas: Partially infiltrated with fat but otherwise unremarkable. Spleen: Normal in size without focal abnormality. Adrenals/Urinary Tract: Adrenal glands appear normal. Kidneys are unremarkable without mass, stone or hydronephrosis. No ureteral or bladder calculi are identified. Bladder is unremarkable. Stomach/Bowel: No dilated large or small bowel loops. No evidence of bowel wall inflammation. Stomach is unremarkable, partially decompressed. Vascular/Lymphatic: No significant vascular findings are present. No enlarged abdominal or pelvic lymph nodes. Reproductive: Uterus and bilateral adnexa are unremarkable. Other: No free fluid or abscess collection is seen. No free intraperitoneal air. Musculoskeletal: Degenerative spondylosis of the thoracolumbar spine, mild to moderate in degree. Associated disc bulge at L4-5 is causing severe central canal stenosis with suspected associated nerve root impingement. Reconstruction images of the pelvis are provided  to include the upper femurs bilaterally. There is a benign osteochondroma exophytic to the proximal shaft of the RIGHT femur. No acute or suspicious osseous abnormality. No fracture is seen at the LEFT hip or within the proximal LEFT femur. Mild degenerative spurring at the LEFT hip. IMPRESSION: 1. No acute findings within the abdomen or pelvis. No bowel obstruction or evidence of bowel wall inflammation. No evidence of diverticulitis. No evidence of acute solid organ abnormality. No renal or ureteral calculi. 2. Degenerative spondylosis of the thoracolumbar spine, most significant in the lower lumbar spine. Associated large disc bulge at L4-5 is causing severe central canal stenosis. If symptoms could be radiculopathic symptoms, would consider nonemergent lumbar spine MRI to exclude associated nerve root impingement. 3. No fracture within the osseous pelvis or at the LEFT hip. Proximal LEFT  femur is intact and normally aligned. Mild degenerative change at the LEFT hip. Electronically Signed   By: Franki Cabot M.D.   On: 12/18/2020 16:52   MR HIP LEFT WO CONTRAST  Result Date: 12/19/2020 CLINICAL DATA:  Soft tissue infection. EXAM: MR OF THE LEFT HIP WITHOUT CONTRAST TECHNIQUE: Multiplanar, multisequence MR imaging was performed. No intravenous contrast was administered. COMPARISON:  None. FINDINGS: Bones: No hip fracture, dislocation or avascular necrosis. No periosteal reaction or bone destruction. No aggressive osseous lesion. Small left SI joint effusion with adjacent soft tissue edema anteriorly and posteriorly in the adjacent multifidus muscle concerning for septic arthritis of the left SI joint. 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 Articular cartilage and labrum Articular cartilage: Partial-thickness cartilage loss of the left femoral head and acetabulum. Labrum: Limited evaluation secondary lack of intra-articular contrast. Degeneration of the left superior labrum. Joint or bursal effusion Joint  effusion:  No hip joint effusion. Bursae:  No bursa formation. Muscles and tendons Flexors: Normal. Extensors: Normal. Abductors: Normal. Adductors: Normal. Gluteals: Moderate tendinosis of the left gluteus medius tendon insertion. Mild tendinosis of the left gluteus minimus tendon insertion. Partial-thickness tear of the right gluteus medius tendon insertion. Hamstrings: Mild tendinosis of the left hamstring origin with a partial-thickness tear. Small partial-thickness tear of the right hamstring origin. Other findings Small amount of pelvic free fluid. No fluid collection or hematoma. No inguinal lymphadenopathy. No inguinal hernia. IMPRESSION: 1. Small left SI joint effusion with adjacent soft tissue edema anteriorly and posteriorly in the adjacent multifidus muscle concerning for septic arthritis of the left SI joint. 2. Moderate tendinosis of the left gluteus medius tendon insertion. Mild tendinosis of the left gluteus minimus tendon insertion. Partial-thickness tear of the right gluteus medius tendon insertion. 3. Mild tendinosis of the left hamstring origin with a partial-thickness tear. Small partial-thickness tear of the right hamstring origin. 4. Mild osteoarthritis of the left hip. Electronically Signed   By: Kathreen Devoid   On: 12/19/2020 10:49   US Venous Img Lower  Left (DVT Study)  Result Date: 12/18/2020 CLINICAL DATA:  60 year old with left hip and buttock pain. EXAM: LEFT LOWER EXTREMITY VENOUS DOPPLER ULTRASOUND TECHNIQUE: Gray-scale sonography with graded compression, as well as color Doppler and duplex ultrasound were performed to evaluate the lower extremity deep venous systems from the level of the common femoral vein and including the common femoral, femoral, profunda femoral, popliteal and calf veins including the posterior tibial, peroneal and gastrocnemius veins when visible. The superficial great saphenous vein was also interrogated. Spectral Doppler was utilized to evaluate flow at  rest and with distal augmentation maneuvers in the common femoral, femoral and popliteal veins. COMPARISON:  None. FINDINGS: Contralateral Common Femoral Vein: Respiratory phasicity is normal and symmetric with the symptomatic side. No evidence of thrombus. Normal compressibility. Common Femoral Vein: No evidence of thrombus. Normal compressibility, respiratory phasicity and response to augmentation. Saphenofemoral Junction: No evidence of thrombus. Normal compressibility and flow on color Doppler imaging. Profunda Femoral Vein: No evidence of thrombus. Normal compressibility and flow on color Doppler imaging. Femoral Vein: No evidence of thrombus. Normal compressibility, respiratory phasicity and response to augmentation. Popliteal Vein: No evidence of thrombus. Normal compressibility, respiratory phasicity and response to augmentation. Calf Veins: No evidence of thrombus. Normal compressibility and flow on color Doppler imaging. Superficial Great Saphenous Vein: No evidence of thrombus. Normal compressibility. Other Findings:  None. IMPRESSION: Negative for deep venous thrombosis in left lower extremity. Electronically Signed   By: Markus Daft  M.D.   On: 12/18/2020 11:49   DG Chest Portable 1 View  Result Date: 12/18/2020 CLINICAL DATA:  Fever EXAM: PORTABLE CHEST 1 VIEW COMPARISON:  None. FINDINGS: Borderline cardiomegaly. Lungs are clear. No pleural effusion or pneumothorax is seen. Osseous structures about the chest are unremarkable. IMPRESSION: No active disease. No evidence of pneumonia or pulmonary edema. Borderline cardiomegaly. Electronically Signed   By: Franki Cabot M.D.   On: 12/18/2020 20:07   CT NO CHARGE  Result Date: 12/18/2020 CLINICAL DATA:  LEFT lower quadrant abdominal pain and LEFT hip pain. Concern for diverticulitis. Concern for adult hip fracture. EXAM: CT ABDOMEN AND PELVIS WITH CONTRAST RECONSTRUCTED IMAGES OF THE PELVIS TECHNIQUE: Multidetector CT imaging of the abdomen and pelvis  was performed using the standard protocol following bolus administration of intravenous contrast. Reconstructions performed of the osseous pelvis. CONTRAST:  184m OMNIPAQUE IOHEXOL 300 MG/ML  SOLN COMPARISON:  CT abdomen dated 03/31/2006. FINDINGS: Lower chest: No acute abnormality. Hepatobiliary: No focal liver abnormality is seen. Status post cholecystectomy with associated mild bile duct ectasia. Pancreas: Partially infiltrated with fat but otherwise unremarkable. Spleen: Normal in size without focal abnormality. Adrenals/Urinary Tract: Adrenal glands appear normal. Kidneys are unremarkable without mass, stone or hydronephrosis. No ureteral or bladder calculi are identified. Bladder is unremarkable. Stomach/Bowel: No dilated large or small bowel loops. No evidence of bowel wall inflammation. Stomach is unremarkable, partially decompressed. Vascular/Lymphatic: No significant vascular findings are present. No enlarged abdominal or pelvic lymph nodes. Reproductive: Uterus and bilateral adnexa are unremarkable. Other: No free fluid or abscess collection is seen. No free intraperitoneal air. Musculoskeletal: Degenerative spondylosis of the thoracolumbar spine, mild to moderate in degree. Associated disc bulge at L4-5 is causing severe central canal stenosis with suspected associated nerve root impingement. Reconstruction images of the pelvis are provided to include the upper femurs bilaterally. There is a benign osteochondroma exophytic to the proximal shaft of the RIGHT femur. No acute or suspicious osseous abnormality. No fracture is seen at the LEFT hip or within the proximal LEFT femur. Mild degenerative spurring at the LEFT hip. IMPRESSION: 1. No acute findings within the abdomen or pelvis. No bowel obstruction or evidence of bowel wall inflammation. No evidence of diverticulitis. No evidence of acute solid organ abnormality. No renal or ureteral calculi. 2. Degenerative spondylosis of the thoracolumbar spine,  most significant in the lower lumbar spine. Associated large disc bulge at L4-5 is causing severe central canal stenosis. If symptoms could be radiculopathic symptoms, would consider nonemergent lumbar spine MRI to exclude associated nerve root impingement. 3. No fracture within the osseous pelvis or at the LEFT hip. Proximal LEFT femur is intact and normally aligned. Mild degenerative change at the LEFT hip. Electronically Signed   By: SFranki CabotM.D.   On: 12/18/2020 16:52   DG Hip Unilat W or Wo Pelvis 2-3 Views Left  Result Date: 12/18/2020 CLINICAL DATA:  LEFT hip pain, no known injury. EXAM: DG HIP (WITH OR WITHOUT PELVIS) 2-3V LEFT COMPARISON:  Plain film of the pelvis dated 09/26/2006 FINDINGS: Single-view of the pelvis and two views of the LEFT hip are provided. Osseous alignment appears normal. No fracture line or displaced fracture fragment is seen. Mild degenerative spurring at the bilateral hip joints. IMPRESSION: 1. No acute findings. 2. Mild degenerative change. Electronically Signed   By: SFranki CabotM.D.   On: 12/18/2020 11:52        Scheduled Meds: . mometasone-formoterol  2 puff Inhalation BID  . montelukast  10  mg Oral q morning  . oxyCODONE  10 mg Oral Q12H   Continuous Infusions: . vancomycin    . vancomycin 2,000 mg (12/20/20 0634)     LOS: 1 day    Time spent: 35 mins.More than 50% of that time was spent in counseling and/or coordination of care.      Shelly Coss, MD [facility] P5/09/2020, 8:33 AM

## 2020-12-20 NOTE — Consult Note (Signed)
Date of Admission:  12/18/2020          Reason for Consult: Septic SI joint and now MRSA bacteremia    Referring Provider: Dr Tawanna Solo  Assessment:  1. MRSA bacteremia with  2. Septic sacroiliac joint 3. Loud murmur rule out endocarditis 4. Obesity  Plan:  1. Continue vancomycin 2. Follow-up IR guided cultures from SI joint 3. Follow-up 2D echocardiogram and she will undoubtedly need a transesophageal echocardiogram 4. Repeat blood cultures after she has had vancomycin for at least 24 hours 5. Avoid any and all central lines 6. Check symptoms for evidence of metastatic infection   Principal Problem:   SIRS (systemic inflammatory response syndrome) (HCC) Active Problems:   Left hip pain   Septic joint (HCC)   Scheduled Meds: . flumazenil      . mometasone-formoterol  2 puff Inhalation BID  . montelukast  10 mg Oral q morning  . naloxone      . oxyCODONE  10 mg Oral Q12H   Continuous Infusions: . sodium chloride    . vancomycin     PRN Meds:.acetaminophen **OR** acetaminophen, HYDROmorphone (DILAUDID) injection, ondansetron (ZOFRAN) IV  HPI: Nicole Hines is a 60 y.o. female with past medical history significant for anxiety, obesity listed history of MRSA infection, who had developed left hip pain last Wednesday.  She then when she came home from work on Friday had noted that it become much much more severe and when she tried to get out of bed the following day she could not get up due to the pain.  She was having high fevers as well up to 102 degrees when she was brought to the emergency department.  CT of the abdomen pelvis performed showed disc bulge at L4 and L5 with severe canal stenosis.  She is admitted to [facility] long hospital.  Blood cultures were taken.  MRI of the hip without contrast was performed which showed an effusion at the [facility] joint with soft tissue edema around it.  There is also evidence of tendinosis of the left gluteus medius and left  hamstring with a small partial-thickness thickness tear on the right.  RI of the lumbar spine without contrast showed inflammation of the lower erector spinae muscle at L5 and S1 no overt evidence of discitis or osteomyelitis.  She also had L4-L5 moderate to severe spinal stenosis and L5-S1 spondylolisthesis with bilateral foraminal stenosis and L2-L3 disc bulging with moderate bilateral neuroforaminal stenosis.  Since then her blood cultures have turned positive for MRSA and she has been started on vancomycin.  Internal radiology have seen her and aspirated the SI joint and sent for culture.  On exam she has a very loud murmur that she was unaware of.  She clearly needs to be aggressively worked up for endocarditis.  She is no need to 6 if not 8-week course of effective anti-MRSA systemic antibiotics.    Review of Systems: Review of Systems  Constitutional: Positive for chills, fever and malaise/fatigue. Negative for diaphoresis and weight loss.  HENT: Negative for congestion, hearing loss, sore throat and tinnitus.   Eyes: Negative for blurred vision and double vision.  Respiratory: Negative for cough, sputum production, shortness of breath and wheezing.   Cardiovascular: Negative for chest pain, palpitations and leg swelling.  Gastrointestinal: Negative for abdominal pain, blood in stool, constipation, diarrhea, heartburn, melena, nausea and vomiting.  Genitourinary: Negative for dysuria, flank pain and hematuria.  Musculoskeletal: Positive for joint pain and myalgias. Negative  for back pain and falls.  Skin: Negative for itching and rash.  Neurological: Positive for focal weakness. Negative for dizziness, sensory change, loss of consciousness, weakness and headaches.  Endo/Heme/Allergies: Does not bruise/bleed easily.  Psychiatric/Behavioral: Negative for depression, memory loss and suicidal ideas. The patient is not nervous/anxious.     Past Medical History:  Diagnosis Date  .  Amenorrhea    irreg cycle  . Anxiety   . Arthritis   . Asthma   . MRSA infection   . Obesity     Social History   Tobacco Use  . Smoking status: Never Smoker  . Smokeless tobacco: Never Used  Substance Use Topics  . Alcohol use: No  . Drug use: No    Family History  Problem Relation Age of Onset  . Cancer Mother        pancreatic  . Diabetes Mother   . Hypertension Mother   . Heart attack Father   . Heart attack Maternal Grandmother   . Diabetes Brother   . Cancer Maternal Aunt        uterine  . Diabetes Maternal Aunt   . Colon cancer Neg Hx    No Known Allergies  OBJECTIVE: Blood pressure 101/72, pulse 70, temperature 99.1 F (37.3 C), temperature source Oral, resp. rate 20, height 5' 7"  (1.702 m), weight 106.2 kg, last menstrual period 02/18/2014, SpO2 100 %.  Physical Exam Constitutional:      General: She is not in acute distress.    Appearance: She is not diaphoretic.  HENT:     Head: Normocephalic and atraumatic.     Right Ear: External ear normal.     Left Ear: External ear normal.     Nose: Nose normal.     Mouth/Throat:     Dentition: Abnormal dentition.     Pharynx: No oropharyngeal exudate.  Eyes:     General: No scleral icterus.    Extraocular Movements: Extraocular movements intact.     Conjunctiva/sclera: Conjunctivae normal.  Cardiovascular:     Rate and Rhythm: Normal rate and regular rhythm.     Heart sounds: Murmur heard.  No friction rub. No gallop.   Pulmonary:     Effort: Pulmonary effort is normal. No respiratory distress.     Breath sounds: Normal breath sounds. No wheezing or rales.  Abdominal:     General: Bowel sounds are normal. There is no distension.     Palpations: Abdomen is soft.     Tenderness: There is no abdominal tenderness. There is no rebound.  Musculoskeletal:     Cervical back: Normal range of motion and neck supple.     Right hip: Decreased range of motion.  Lymphadenopathy:     Cervical: No cervical  adenopathy.  Skin:    General: Skin is warm and dry.     Coloration: Skin is not pale.     Findings: No erythema or rash.  Neurological:     General: No focal deficit present.     Mental Status: She is alert and oriented to person, place, and time.  Psychiatric:        Attention and Perception: Attention normal.        Mood and Affect: Mood is anxious.        Speech: Speech normal.        Behavior: Behavior normal. Behavior is cooperative.        Thought Content: Thought content normal.        Cognition and  Memory: Cognition and memory normal.        Judgment: Judgment normal.     Lab Results Lab Results  Component Value Date   WBC 15.5 (H) 12/19/2020   HGB 12.1 12/19/2020   HCT 36.7 12/19/2020   MCV 93.9 12/19/2020   PLT 191 12/19/2020    Lab Results  Component Value Date   CREATININE 0.59 12/19/2020   BUN 17 12/19/2020   NA 139 12/19/2020   K 4.0 12/19/2020   CL 104 12/19/2020   CO2 26 12/19/2020    Lab Results  Component Value Date   ALT 25 12/19/2020   AST 25 12/19/2020   ALKPHOS 69 12/19/2020   BILITOT 0.8 12/19/2020     Microbiology: Recent Results (from the past 240 hour(s))  Resp Panel by RT-PCR (Flu A&B, Covid) Nasopharyngeal Swab     Status: None   Collection Time: 12/18/20  9:10 PM   Specimen: Nasopharyngeal Swab; Nasopharyngeal(NP) swabs in vial transport medium  Result Value Ref Range Status   SARS Coronavirus 2 by RT PCR NEGATIVE NEGATIVE Final    Comment: (NOTE) SARS-CoV-2 target nucleic acids are NOT DETECTED.  The SARS-CoV-2 RNA is generally detectable in upper respiratory specimens during the acute phase of infection. The lowest concentration of SARS-CoV-2 viral copies this assay can detect is 138 copies/mL. A negative result does not preclude SARS-Cov-2 infection and should not be used as the sole basis for treatment or other patient management decisions. A negative result may occur with  improper specimen collection/handling, submission  of specimen other than nasopharyngeal swab, presence of viral mutation(s) within the areas targeted by this assay, and inadequate number of viral copies(<138 copies/mL). A negative result must be combined with clinical observations, patient history, and epidemiological information. The expected result is Negative.  Fact Sheet for Patients:  EntrepreneurPulse.com.au  Fact Sheet for Healthcare Providers:  IncredibleEmployment.be  This test is no t yet approved or cleared by the [place] FDA and  has been authorized for detection and/or diagnosis of SARS-CoV-2 by FDA under an Emergency Use Authorization (EUA). This EUA will remain  in effect (meaning this test can be used) for the duration of the COVID-19 declaration under Section 564(b)(1) of the Act, 21 U.S.C.section 360bbb-3(b)(1), unless the authorization is terminated  or revoked sooner.       Influenza A by PCR NEGATIVE NEGATIVE Final   Influenza B by PCR NEGATIVE NEGATIVE Final    Comment: (NOTE) The Xpert Xpress SARS-CoV-2/FLU/RSV plus assay is intended as an aid in the diagnosis of influenza from Nasopharyngeal swab specimens and should not be used as a sole basis for treatment. Nasal washings and aspirates are unacceptable for Xpert Xpress SARS-CoV-2/FLU/RSV testing.  Fact Sheet for Patients: EntrepreneurPulse.com.au  Fact Sheet for Healthcare Providers: IncredibleEmployment.be  This test is not yet approved or cleared by the [place] FDA and has been authorized for detection and/or diagnosis of SARS-CoV-2 by FDA under an Emergency Use Authorization (EUA). This EUA will remain in effect (meaning this test can be used) for the duration of the COVID-19 declaration under Section 564(b)(1) of the Act, 21 U.S.C. section 360bbb-3(b)(1), unless the authorization is terminated or revoked.  Performed at [facility], [address], [place], [place] [address]   Culture, blood (routine x 2)     Status: None (Preliminary result)   Collection Time: 12/18/20 10:00 PM   Specimen: BLOOD  Result Value Ref Range Status   Specimen Description   Final  BLOOD RIGHT HAND Performed at [facility], [address] [place], [place] [address]    Special Requests   Final    BOTTLES DRAWN AEROBIC AND ANAEROBIC Blood Culture adequate volume Performed at [facility], [address] [place], [place] [address]    Culture  Setup Time   Final    GRAM POSITIVE COCCI IN BOTH AEROBIC AND ANAEROBIC BOTTLES CRITICAL RESULT CALLED TO, READ BACK BY AND VERIFIED WITH: VALERIE POWELL,RN 12/20/2020 AT 0522 A.HUGHES    Culture   Final    NO GROWTH < 24 HOURS Performed at [facility], [place] [address] [place], [place] [address]    Report Status PENDING  Incomplete  Culture, blood (routine x 2)     Status: None (Preliminary result)   Collection Time: 12/18/20 10:00 PM   Specimen: BLOOD  Result Value Ref Range Status   Specimen Description   Final    BLOOD LEFT HAND Performed at [facility], [address] [place], [place] [address]    Special Requests   Final    BOTTLES DRAWN AEROBIC AND ANAEROBIC Blood Culture adequate volume Performed at [facility], [address], [place], [place] [address]    Culture  Setup Time   Final    GRAM POSITIVE COCCI IN BOTH AEROBIC AND ANAEROBIC BOTTLES Organism ID to follow CRITICAL RESULT CALLED TO, READ BACK BY AND VERIFIED WITH: VALERIE POWELL,RN 12/20/2020 AT 0521 A.HUGHES    Culture   Final    NO GROWTH < 24 HOURS Performed at [facility], [place] [address], [place],  [address]    Report Status PENDING  Incomplete  Blood Culture ID Panel (Reflexed)     Status: Abnormal   Collection Time: 12/18/20 10:00 PM  Result Value Ref Range Status   Enterococcus faecalis NOT DETECTED  NOT DETECTED Final   Enterococcus Faecium NOT DETECTED NOT DETECTED Final   Listeria monocytogenes NOT DETECTED NOT DETECTED Final   Staphylococcus species DETECTED (A) NOT DETECTED Final    Comment: CRITICAL RESULT CALLED TO, READ BACK BY AND VERIFIED WITH: VALERIE POWELL,RN 12/20/2020 AT 0522 A.HUGHES    Staphylococcus aureus (BCID) DETECTED (A) NOT DETECTED Final    Comment: Methicillin (oxacillin)-resistant Staphylococcus aureus (MRSA). MRSA is predictably resistant to beta-lactam antibiotics (except ceftaroline). Preferred therapy is vancomycin unless clinically contraindicated. Patient requires contact precautions if  hospitalized. CRITICAL RESULT CALLED TO, READ BACK BY AND VERIFIED WITH: VALERIE POWELL,RN 12/20/2020 AT 0522 A.HUGHES    Staphylococcus epidermidis NOT DETECTED NOT DETECTED Final   Staphylococcus lugdunensis NOT DETECTED NOT DETECTED Final   Streptococcus species NOT DETECTED NOT DETECTED Final   Streptococcus agalactiae NOT DETECTED NOT DETECTED Final   Streptococcus pneumoniae NOT DETECTED NOT DETECTED Final   Streptococcus pyogenes NOT DETECTED NOT DETECTED Final   A.calcoaceticus-baumannii NOT DETECTED NOT DETECTED Final   Bacteroides fragilis NOT DETECTED NOT DETECTED Final   Enterobacterales NOT DETECTED NOT DETECTED Final   Enterobacter cloacae complex NOT DETECTED NOT DETECTED Final   Escherichia coli NOT DETECTED NOT DETECTED Final   Klebsiella aerogenes NOT DETECTED NOT DETECTED Final   Klebsiella oxytoca NOT DETECTED NOT DETECTED Final   Klebsiella pneumoniae NOT DETECTED NOT DETECTED Final   Proteus species NOT DETECTED NOT DETECTED Final   Salmonella species NOT DETECTED NOT DETECTED Final   Serratia marcescens NOT DETECTED NOT DETECTED Final   Haemophilus influenzae NOT DETECTED NOT DETECTED Final   Neisseria meningitidis NOT DETECTED NOT DETECTED Final  Pseudomonas aeruginosa NOT DETECTED NOT DETECTED Final   Stenotrophomonas maltophilia NOT  DETECTED NOT DETECTED Final   Candida albicans NOT DETECTED NOT DETECTED Final   Candida auris NOT DETECTED NOT DETECTED Final   Candida glabrata NOT DETECTED NOT DETECTED Final   Candida krusei NOT DETECTED NOT DETECTED Final   Candida parapsilosis NOT DETECTED NOT DETECTED Final   Candida tropicalis NOT DETECTED NOT DETECTED Final   Cryptococcus neoformans/gattii NOT DETECTED NOT DETECTED Final   Meth resistant mecA/C and MREJ DETECTED (A) NOT DETECTED Final    Comment: CRITICAL RESULT CALLED TO, READ BACK BY AND VERIFIED WITH: VALERIE POWELL,RN 12/20/2020 AT 0522 A.HUGHES Performed at [facility], [place] [address] [place], [place] [address]     Alcide Evener, [place] [phone] pager  12/20/2020, 10:12 AM

## 2020-12-20 NOTE — Procedures (Signed)
Interventional Radiology Procedure Note  Procedure: CT guided needle placement for aspiration of left SI joint, posterior approach.  Scan blood aspirated, with then a sterile wash performed for slight more material. Total of ~1cc or less of fluid achieved.  .  Complications: None  Recommendations:  - follow up culture   - Routine wound care   Signed,  Dulcy Fanny. Earleen Newport, DO

## 2020-12-20 NOTE — Progress Notes (Signed)
Pharmacy Antibiotic Note  Nicole Hines is a 60 y.o. female admitted on 12/18/2020 with bacteremia.  Pharmacy has been consulted for Vancomycin dosing.  Admit with left septic sacroiliac joint.  ID already following and per notes planned to start Cefepime + Vancomycin once hip aspirate completed.    Patient febrile, blood cx + MRSA.  Pharmacy consulted to start Vancomycin.   Plan: Vancomycin 2gm IV x1 then 1566m IV q12h  to target AUC 400-550 Vancomycin levels at steady state Monitor renal function and cx data   Height: 5' 7"  (170.2 cm) Weight: 106.2 kg (234 lb 2.1 oz) IBW/kg (Calculated) : 61.6  Temp (24hrs), Avg:100.1 F (37.8 C), Min:98.1 F (36.7 C), Max:102.7 F (39.3 C)  Recent Labs  Lab 12/18/20 1105 12/18/20 2154 12/19/20 0751  WBC 16.0*  --  15.5*  CREATININE 0.54  --  0.59  LATICACIDVEN  --  0.6  --     Estimated Creatinine Clearance: 94.9 mL/min (by C-G formula based on SCr of 0.59 mg/dL).    No Known Allergies  Antimicrobials this admission: 5/2 Vancomycin >>   Dose adjustments this admission:  Microbiology results: 4/30 BCx: GPC (BCID + MRSA)  Thank you for allowing pharmacy to be a part of this patient's care.  MNetta Cedars PharmD 12/20/2020 5:59 AM

## 2020-12-21 DIAGNOSIS — R509 Fever, unspecified: Secondary | ICD-10-CM

## 2020-12-21 DIAGNOSIS — I330 Acute and subacute infective endocarditis: Secondary | ICD-10-CM

## 2020-12-21 DIAGNOSIS — G8929 Other chronic pain: Secondary | ICD-10-CM

## 2020-12-21 DIAGNOSIS — Z22322 Carrier or suspected carrier of Methicillin resistant Staphylococcus aureus: Secondary | ICD-10-CM

## 2020-12-21 DIAGNOSIS — M25559 Pain in unspecified hip: Secondary | ICD-10-CM

## 2020-12-21 LAB — CBC WITH DIFFERENTIAL/PLATELET
Abs Immature Granulocytes: 0.06 10*3/uL (ref 0.00–0.07)
Basophils Absolute: 0 10*3/uL (ref 0.0–0.1)
Basophils Relative: 0 %
Eosinophils Absolute: 0 10*3/uL (ref 0.0–0.5)
Eosinophils Relative: 0 %
HCT: 41.7 % (ref 36.0–46.0)
Hemoglobin: 13.3 g/dL (ref 12.0–15.0)
Immature Granulocytes: 0 %
Lymphocytes Relative: 10 %
Lymphs Abs: 1.4 10*3/uL (ref 0.7–4.0)
MCH: 30.2 pg (ref 26.0–34.0)
MCHC: 31.9 g/dL (ref 30.0–36.0)
MCV: 94.8 fL (ref 80.0–100.0)
Monocytes Absolute: 1.6 10*3/uL — ABNORMAL HIGH (ref 0.1–1.0)
Monocytes Relative: 12 %
Neutro Abs: 10.8 10*3/uL — ABNORMAL HIGH (ref 1.7–7.7)
Neutrophils Relative %: 78 %
Platelets: 179 10*3/uL (ref 150–400)
RBC: 4.4 MIL/uL (ref 3.87–5.11)
RDW: 12.6 % (ref 11.5–15.5)
WBC: 13.8 10*3/uL — ABNORMAL HIGH (ref 4.0–10.5)
nRBC: 0 % (ref 0.0–0.2)

## 2020-12-21 LAB — HEPATITIS PANEL, ACUTE
HCV Ab: NONREACTIVE
Hep A IgM: NONREACTIVE
Hep B C IgM: NONREACTIVE
Hepatitis B Surface Ag: NONREACTIVE

## 2020-12-21 LAB — BASIC METABOLIC PANEL
Anion gap: 11 (ref 5–15)
BUN: 12 mg/dL (ref 6–20)
CO2: 26 mmol/L (ref 22–32)
Calcium: 9.8 mg/dL (ref 8.9–10.3)
Chloride: 97 mmol/L — ABNORMAL LOW (ref 98–111)
Creatinine, Ser: 0.51 mg/dL (ref 0.44–1.00)
GFR, Estimated: >60 mL/min (ref >60)
Glucose, Bld: 128 mg/dL — ABNORMAL HIGH (ref 70–99)
Potassium: 3.8 mmol/L (ref 3.5–5.1)
Sodium: 134 mmol/L — ABNORMAL LOW (ref 135–145)

## 2020-12-21 LAB — C-REACTIVE PROTEIN: CRP: 30.2 mg/dL — ABNORMAL HIGH (ref <1.0)

## 2020-12-21 LAB — SEDIMENTATION RATE: Sed Rate: 114 mm/hr — ABNORMAL HIGH (ref 0–22)

## 2020-12-21 MED ORDER — GABAPENTIN 300 MG PO CAPS
300.0000 mg | ORAL_CAPSULE | Freq: Every day | ORAL | Status: DC
  Administered 2020-12-21 – 2020-12-29 (×9): 300 mg via ORAL
  Filled 2020-12-21 (×9): qty 1

## 2020-12-21 MED ORDER — OXYCODONE HCL 5 MG PO TABS
10.0000 mg | ORAL_TABLET | Freq: Four times a day (QID) | ORAL | Status: DC | PRN
Start: 2020-12-21 — End: 2020-12-29
  Administered 2020-12-22 – 2020-12-28 (×14): 10 mg via ORAL
  Filled 2020-12-21 (×14): qty 2

## 2020-12-21 MED ORDER — ENOXAPARIN SODIUM 60 MG/0.6ML IJ SOSY
50.0000 mg | PREFILLED_SYRINGE | INTRAMUSCULAR | Status: DC
  Administered 2020-12-21 – 2020-12-29 (×9): 50 mg via SUBCUTANEOUS
  Filled 2020-12-21 (×9): qty 0.6

## 2020-12-21 MED ORDER — OXYCODONE HCL ER 10 MG PO T12A: 10.0000 mg | EXTENDED_RELEASE_TABLET | Freq: Four times a day (QID) | ORAL | Status: DC | PRN

## 2020-12-21 MED ORDER — PANTOPRAZOLE SODIUM 40 MG PO TBEC
40.0000 mg | DELAYED_RELEASE_TABLET | Freq: Every day | ORAL | Status: DC
  Administered 2020-12-21 – 2020-12-30 (×10): 40 mg via ORAL
  Filled 2020-12-21 (×10): qty 1

## 2020-12-21 MED ORDER — ALPRAZOLAM 1 MG PO TABS
1.0000 mg | ORAL_TABLET | Freq: Every evening | ORAL | Status: DC | PRN
  Administered 2020-12-21 – 2020-12-29 (×8): 1 mg via ORAL
  Filled 2020-12-21 (×8): qty 1

## 2020-12-21 MED ORDER — KETOROLAC TROMETHAMINE 30 MG/ML IJ SOLN
30.0000 mg | Freq: Four times a day (QID) | INTRAMUSCULAR | Status: DC | PRN
  Administered 2020-12-21 – 2020-12-23 (×9): 30 mg via INTRAVENOUS
  Filled 2020-12-21 (×9): qty 1

## 2020-12-21 NOTE — Evaluation (Signed)
Physical Therapy Evaluation Patient Details Name: Nicole Hines MRN: [id number] DOB: Dec 23, 1960 Today's Date: 12/21/2020   History of Present Illness  60 year old female who presented to the emergency department at [facility] with complaint of worsening left hip pain, inability to ambulate due to pain.  In the emergency department she was found to have fever, lab work showed leukocytosis.   MRI revealed left septic sacroiliac joint. Dx of MRSA, SIRS, septic L SIJ. Pt with history of chronic knee pain, asthma  Clinical Impression  Pt admitted with above diagnosis. Per RN, pt had severe, uncontrolled pain this morning. She is now more comfortable after receiving toradol. Pt reports she cannot tolerate bed mobility, sitting, nor standing 2* pain. I instructed pt in supine bed level exercises to minimize deconditioning during hospitalization. Will initiate mobility as tolerated. Pt may need ST-SNF, depending on progress, though she is hopeful to DC home with assist from family.   Pt currently with functional limitations due to the deficits listed below (see PT Problem List). Pt will benefit from skilled PT to increase their independence and safety with mobility to allow discharge to the venue listed below.       Follow Up Recommendations SNF;Supervision for mobility/OOB (SNF vs HHPT depending on progess. Pt hopeful for home.)    Equipment Recommendations  Rolling walker with 5" wheels    Recommendations for Other Services       Precautions / Restrictions Precautions Precautions: Fall Restrictions Weight Bearing Restrictions: No      Mobility  Bed Mobility               General bed mobility comments: pt stated pain increases significantly if she attempts to roll, she stated she couldn't tolerate sitting bc of pressure to her L hip would be severely painful    Transfers                 General transfer comment: NT- pt reports she cannot tolerate weight through  LLE  Ambulation/Gait                Stairs            Wheelchair Mobility    Modified Rankin (Stroke Patients Only)       Balance                                             Pertinent Vitals/Pain Pain Assessment: 0-10 Pain Score: 6  Pain Location: L SI joint Pain Descriptors / Indicators: Grimacing;Guarding;Sore Pain Intervention(s): Limited activity within patient's tolerance;Monitored during session;Premedicated before session;Repositioned    Home Living Family/patient expects to be discharged to:: Private residence Living Arrangements: Parent Available Help at Discharge: Family;Available PRN/intermittently   Home Access: Stairs to enter Entrance Stairs-Rails: None Entrance Stairs-Number of Steps: 3 Home Layout: One level Home Equipment: None Additional Comments: daughter lives nearby but work    Prior Function Level of Independence: Independent         Comments: lives with step dad who is a dialysis pt, pt helped him PTA,  works as Camera operator at [employer], pt stated other family can assist her at home prn     Hand Dominance        Extremity/Trunk Assessment   Upper Extremity Assessment Upper Extremity Assessment: Overall WFL for tasks assessed    Lower Extremity Assessment Lower Extremity  Assessment: LLE deficits/detail LLE Deficits / Details: SLR 3/5 but painful and required max effort, able to perform L heel slide LLE: Unable to fully assess due to pain LLE Sensation: WNL       Communication   Communication: No difficulties  Cognition Arousal/Alertness: Awake/alert Behavior During Therapy: WFL for tasks assessed/performed Overall Cognitive Status: Within Functional Limits for tasks assessed                                        General Comments      Exercises General Exercises - Lower Extremity Ankle Circles/Pumps: AROM;Both;10 reps;Supine Quad Sets: AROM;Both;5  reps;Supine Gluteal Sets: AROM;Both;5 reps;Supine Shoulder Exercises Shoulder Flexion: AROM;Both;10 reps;Supine   Assessment/Plan    PT Assessment Patient needs continued PT services  PT Problem List Decreased mobility;Decreased activity tolerance       PT Treatment Interventions Therapeutic activities;Therapeutic exercise;Stair training;Functional mobility training;Patient/family education;Gait training;DME instruction    PT Goals (Current goals can be found in the Care Plan section)  Acute Rehab PT Goals Patient Stated Goal: 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 PT Goal Formulation: With patient Time For Goal Achievement: 01/04/21 Potential to Achieve Goals: Good    Frequency Min 3X/week   Barriers to discharge        Co-evaluation               AM-PAC PT "6 Clicks" Mobility  Outcome Measure Help needed turning from your back to your side while in a flat bed without using bedrails?: A Lot Help needed moving from lying on your back to sitting on the side of a flat bed without using bedrails?: Total Help needed moving to and from a bed to a chair (including a wheelchair)?: Total Help needed standing up from a chair using your arms (e.g., wheelchair or bedside chair)?: Total Help needed to walk in hospital room?: Total Help needed climbing 3-5 steps with a railing? : Total 6 Click Score: 7    End of Session   Activity Tolerance: Patient limited by pain Patient left: in bed;with call bell/phone within reach;with bed alarm set Nurse Communication: Mobility status;Need for lift equipment PT Visit Diagnosis: Difficulty in walking, not elsewhere classified (R26.2);Pain Pain - Right/Left: Left Pain - part of body: Hip    Time: 1355-1413 PT Time Calculation (min) (ACUTE ONLY): 18 min   Charges:   PT Evaluation $PT Eval Moderate Complexity: 1 Mod          Philomena Doheny PT 12/21/2020  Acute Rehabilitation Services Pager  [phone] Office [phone]

## 2020-12-21 NOTE — Progress Notes (Addendum)
PROGRESS NOTE    Nicole Hines  WUJ:[id number] DOB: Jun 29, 1961 DOA: 12/18/2020 PCP: [name], MD   Chief Complain: Left hip pain  Brief Narrative: Patient is a 60 year old female with history of chronic knee pain, asthma who presented to the emergency department at [facility] with complaint of worsening left hip pain, inability to ambulate due to pain.  In the emergency department she was found to have fever, lab work showed leukocytosis.  MRI revealed left septic sacroiliac joint.  Orthopedics, IR , ID consulted.  Underwent  CT-guided aspiration of left on 12/20/20.  Blood culture showed MRSA,on vancomycin.  Assessment & Plan:   Principal Problem:   MRSA (methicillin resistant Staphylococcus aureus) carrier Active Problems:   SIRS (systemic inflammatory response syndrome) (HCC)   Left hip pain   Septic joint (HCC)   Left septic sacroiliac joint: Presented with fever, left hip pain.  MRI of the lumbar spine showed left septic sacroiliac joint.  IR, orthopedics, ID consulted. Underwent  CT-guided aspiration of left on 12/20/20 Follow-up final report of blood cultures, joint fluid culture.  Patient on vancomycin.  MRSA bacteremia: Presented with fever, leukocytosis.  Source is left hip. .  Currently on vancomycin.  Follow final culture and sensitivity.  Leukocytosis improving.  Elevated CRP She will need TTE/TEE.  She has a pansystolic murmur.  Plan for repeating blood cultures  Chronic pain syndrome: Continue supportive care, pain medications  History of asthma: Currently saturating fine on room air.  No wheezing  Debility/deconditioning/left hip pain: Requested  PT/OT.  Morbid obesity: BMI of 36.6           DVT prophylaxis:lovenox Code Status: Full Family Communication: Called and discussed with daughter on phone Status is: Inpatient  Remains inpatient appropriate because:Inpatient level of care appropriate due to severity of illness   Dispo: The patient  is from: Home              Anticipated d/c is to: Home              Patient currently is not medically stable to d/c.   Difficult to place patient No      Consultants: ID, orthopedics, IR  Procedures:  Antimicrobials:  Anti-infectives (From admission, onward)   Start     Dose/Rate Route Frequency Ordered Stop   12/20/20 1900  vancomycin (VANCOREADY) IVPB 1500 mg/300 mL        1,500 mg 150 mL/hr over 120 Minutes Intravenous Every 12 hours 12/20/20 0613     12/20/20 0645  vancomycin (VANCOREADY) IVPB 2000 mg/400 mL        2,000 mg 200 mL/hr over 120 Minutes Intravenous  Once 12/20/20 0554 12/20/20 0834      Subjective:  Patient seen and examined the bedside this morning.  Hemodynamically stable.  Complains of severe left hip pain on the aspiration site.  Afebrile  Objective: Vitals:   12/20/20 1420 12/20/20 1719 12/20/20 2038 12/21/20 0539  BP:  128/72 140/73 138/78  Pulse:  67 87 69  Resp:  20 20 18   Temp: 100 F (37.8 C) 98.4 F (36.9 C) 100.1 F (37.8 C) 99.1 F (37.3 C)  TempSrc: Oral Oral    SpO2:  98% 98% 98%  Weight:      Height:        Intake/Output Summary (Last 24 hours) at 12/21/2020 0738 Last data filed at 12/21/2020 0545 Gross per 24 hour  Intake --  Output 1350 ml  Net -1350 ml  Filed Weights   12/18/20 1027 12/19/20 0236  Weight: 113.4 kg 106.2 kg    Examination:  General exam: Uncomfortable due to pain, morbidly obese HEENT: PERRL Respiratory system:  no wheezes or crackles  Cardiovascular system: S1 & S2 heard, RRR.  Gastrointestinal system: Abdomen is nondistended, soft and nontender. Central nervous system: Alert and oriented Extremities: No edema, no clubbing ,no cyanosis Skin: No rashes, no ulcers,no icterus    Data Reviewed: I have personally reviewed following labs and imaging studies  CBC: Recent Labs  Lab 12/18/20 1105 12/19/20 0751 12/21/20 0503  WBC 16.0* 15.5* 13.8*  NEUTROABS 13.6* 13.5* 10.8*  HGB 12.5 12.1 13.3   HCT 37.9 36.7 41.7  MCV 92.7 93.9 94.8  PLT 205 191 604   Basic Metabolic Panel: Recent Labs  Lab 12/18/20 1105 12/19/20 0751 12/21/20 0503  NA 137 139 134*  K 3.1* 4.0 3.8  CL 101 104 97*  CO2 27 26 26   GLUCOSE 158* 185* 128*  BUN 15 17 12   CREATININE 0.54 0.59 0.51  CALCIUM 9.7 9.7 9.8   GFR: Estimated Creatinine Clearance: 94.9 mL/min (by C-G formula based on SCr of 0.51 mg/dL). Liver Function Tests: Recent Labs  Lab 12/18/20 1105 12/19/20 0751  AST 15 25  ALT 16 25  ALKPHOS 63 69  BILITOT 0.9 0.8  PROT 7.6 8.0  ALBUMIN 4.2 3.4*   No results for input(s): LIPASE, AMYLASE in the last 168 hours. No results for input(s): AMMONIA in the last 168 hours. Coagulation Profile: No results for input(s): INR, PROTIME in the last 168 hours. Cardiac Enzymes: No results for input(s): CKTOTAL, CKMB, CKMBINDEX, TROPONINI in the last 168 hours. BNP (last 3 results) No results for input(s): PROBNP in the last 8760 hours. HbA1C: No results for input(s): HGBA1C in the last 72 hours. CBG: No results for input(s): GLUCAP in the last 168 hours. Lipid Profile: No results for input(s): CHOL, HDL, LDLCALC, TRIG, CHOLHDL, LDLDIRECT in the last 72 hours. Thyroid Function Tests: No results for input(s): TSH, T4TOTAL, FREET4, T3FREE, THYROIDAB in the last 72 hours. Anemia Panel: No results for input(s): VITAMINB12, FOLATE, FERRITIN, TIBC, IRON, RETICCTPCT in the last 72 hours. Sepsis Labs: Recent Labs  Lab 12/18/20 2154  LATICACIDVEN 0.6    Recent Results (from the past 240 hour(s))  Resp Panel by RT-PCR (Flu A&B, Covid) Nasopharyngeal Swab     Status: None   Collection Time: 12/18/20  9:10 PM   Specimen: Nasopharyngeal Swab; Nasopharyngeal(NP) swabs in vial transport medium  Result Value Ref Range Status   SARS Coronavirus 2 by RT PCR NEGATIVE NEGATIVE Final    Comment: (NOTE) SARS-CoV-2 target nucleic acids are NOT DETECTED.  The SARS-CoV-2 RNA is generally detectable in  upper respiratory specimens during the acute phase of infection. The lowest concentration of SARS-CoV-2 viral copies this assay can detect is 138 copies/mL. A negative result does not preclude SARS-Cov-2 infection and should not be used as the sole basis for treatment or other patient management decisions. A negative result may occur with  improper specimen collection/handling, submission of specimen other than nasopharyngeal swab, presence of viral mutation(s) within the areas targeted by this assay, and inadequate number of viral copies(<138 copies/mL). A negative result must be combined with clinical observations, patient history, and epidemiological information. The expected result is Negative.  Fact Sheet for Patients:  EntrepreneurPulse.com.au  Fact Sheet for Healthcare Providers:  IncredibleEmployment.be  This test is no t yet approved or cleared by the [place] and  has been authorized for detection and/or diagnosis of SARS-CoV-2 by FDA under an Emergency Use Authorization (EUA). This EUA will remain  in effect (meaning this test can be used) for the duration of the COVID-19 declaration under Section 564(b)(1) of the Act, 21 U.S.C.section 360bbb-3(b)(1), unless the authorization is terminated  or revoked sooner.       Influenza A by PCR NEGATIVE NEGATIVE Final   Influenza B by PCR NEGATIVE NEGATIVE Final    Comment: (NOTE) The Xpert Xpress SARS-CoV-2/FLU/RSV plus assay is intended as an aid in the diagnosis of influenza from Nasopharyngeal swab specimens and should not be used as a sole basis for treatment. Nasal washings and aspirates are unacceptable for Xpert Xpress SARS-CoV-2/FLU/RSV testing.  Fact Sheet for Patients: EntrepreneurPulse.com.au  Fact Sheet for Healthcare Providers: IncredibleEmployment.be  This test is not yet approved or cleared by the [place] FDA and has been  authorized for detection and/or diagnosis of SARS-CoV-2 by FDA under an Emergency Use Authorization (EUA). This EUA will remain in effect (meaning this test can be used) for the duration of the COVID-19 declaration under Section 564(b)(1) of the Act, 21 U.S.C. section 360bbb-3(b)(1), unless the authorization is terminated or revoked.  Performed at [facility], [address], [place], [place] [address]   Culture, blood (routine x 2)     Status: None (Preliminary result)   Collection Time: 12/18/20 10:00 PM   Specimen: BLOOD  Result Value Ref Range Status   Specimen Description   Final    BLOOD RIGHT HAND Performed at [facility], [address], [place], [place] [address]    Special Requests   Final    BOTTLES DRAWN AEROBIC AND ANAEROBIC Blood Culture adequate volume Performed at [facility], [address] [place], [place] [address]    Culture  Setup Time   Final    GRAM POSITIVE COCCI IN BOTH AEROBIC AND ANAEROBIC BOTTLES CRITICAL RESULT CALLED TO, READ BACK BY AND VERIFIED WITH: VALERIE POWELL,RN 12/20/2020 AT 0522 A.HUGHES    Culture   Final    NO GROWTH < 24 HOURS Performed at [facility], [place] [address], [place], [place] [address]    Report Status PENDING  Incomplete  Culture, blood (routine x 2)     Status: None (Preliminary result)   Collection Time: 12/18/20 10:00 PM   Specimen: BLOOD  Result Value Ref Range Status   Specimen Description   Final    BLOOD LEFT HAND Performed at [facility], [address] [place], [place] [address]    Special Requests   Final    BOTTLES DRAWN AEROBIC AND ANAEROBIC Blood Culture adequate volume Performed at [facility], [address] [place], [place] [address]    Culture  Setup Time   Final    GRAM POSITIVE COCCI IN BOTH AEROBIC AND ANAEROBIC BOTTLES Organism ID to follow CRITICAL RESULT CALLED TO, READ BACK BY  AND VERIFIED WITH: VALERIE POWELL,RN 12/20/2020 AT 0521 A.HUGHES    Culture   Final    NO GROWTH < 24 HOURS Performed at [facility], [place] [address] [place], [place] [address]    Report Status PENDING  Incomplete  Blood Culture ID Panel (Reflexed)     Status: Abnormal   Collection Time: 12/18/20 10:00 PM  Result Value Ref Range Status   Enterococcus faecalis NOT DETECTED NOT DETECTED Final   Enterococcus Faecium NOT DETECTED NOT DETECTED Final   Listeria monocytogenes NOT DETECTED NOT DETECTED  Final   Staphylococcus species DETECTED (A) NOT DETECTED Final    Comment: CRITICAL RESULT CALLED TO, READ BACK BY AND VERIFIED WITH: VALERIE POWELL,RN 12/20/2020 AT 0522 A.HUGHES    Staphylococcus aureus (BCID) DETECTED (A) NOT DETECTED Final    Comment: Methicillin (oxacillin)-resistant Staphylococcus aureus (MRSA). MRSA is predictably resistant to beta-lactam antibiotics (except ceftaroline). Preferred therapy is vancomycin unless clinically contraindicated. Patient requires contact precautions if  hospitalized. CRITICAL RESULT CALLED TO, READ BACK BY AND VERIFIED WITH: VALERIE POWELL,RN 12/20/2020 AT 0522 A.HUGHES    Staphylococcus epidermidis NOT DETECTED NOT DETECTED Final   Staphylococcus lugdunensis NOT DETECTED NOT DETECTED Final   Streptococcus species NOT DETECTED NOT DETECTED Final   Streptococcus agalactiae NOT DETECTED NOT DETECTED Final   Streptococcus pneumoniae NOT DETECTED NOT DETECTED Final   Streptococcus pyogenes NOT DETECTED NOT DETECTED Final   A.calcoaceticus-baumannii NOT DETECTED NOT DETECTED Final   Bacteroides fragilis NOT DETECTED NOT DETECTED Final   Enterobacterales NOT DETECTED NOT DETECTED Final   Enterobacter cloacae complex NOT DETECTED NOT DETECTED Final   Escherichia coli NOT DETECTED NOT DETECTED Final   Klebsiella aerogenes NOT DETECTED NOT DETECTED Final   Klebsiella oxytoca NOT DETECTED NOT DETECTED Final   Klebsiella pneumoniae NOT DETECTED  NOT DETECTED Final   Proteus species NOT DETECTED NOT DETECTED Final   Salmonella species NOT DETECTED NOT DETECTED Final   Serratia marcescens NOT DETECTED NOT DETECTED Final   Haemophilus influenzae NOT DETECTED NOT DETECTED Final   Neisseria meningitidis NOT DETECTED NOT DETECTED Final   Pseudomonas aeruginosa NOT DETECTED NOT DETECTED Final   Stenotrophomonas maltophilia NOT DETECTED NOT DETECTED Final   Candida albicans NOT DETECTED NOT DETECTED Final   Candida auris NOT DETECTED NOT DETECTED Final   Candida glabrata NOT DETECTED NOT DETECTED Final   Candida krusei NOT DETECTED NOT DETECTED Final   Candida parapsilosis NOT DETECTED NOT DETECTED Final   Candida tropicalis NOT DETECTED NOT DETECTED Final   Cryptococcus neoformans/gattii NOT DETECTED NOT DETECTED Final   Meth resistant mecA/C and MREJ DETECTED (A) NOT DETECTED Final    Comment: CRITICAL RESULT CALLED TO, READ BACK BY AND VERIFIED WITH: VALERIE POWELL,RN 12/20/2020 AT 0522 A.HUGHES Performed at [facility], [place] [address], [place], [place] [address]   Aerobic/Anaerobic Culture (surgical/deep wound)     Status: None (Preliminary result)   Collection Time: 12/20/20 10:02 AM   Specimen: Abscess  Result Value Ref Range Status   Specimen Description   Final    ABSCESS LT SI JOINT ASPIRATION Performed at [facility] [address], [place], [place] [address]    Special Requests   Final    NONE Performed at [facility], [place] [address] [place], [place] [address]    Gram Stain   Final    NO WBC SEEN RARE GRAM POSITIVE COCCI Performed at [facility], [place] [address] [place], [place] [address]    Culture PENDING  Incomplete   Report Status PENDING  Incomplete         Radiology Studies: MR LUMBAR SPINE WO CONTRAST  Result Date: 12/19/2020 CLINICAL DATA:  60 year old female with low back pain. Evidence of L4-L5 spinal stenosis on CT Abdomen and Pelvis  yesterday. Possible infection. EXAM: MRI LUMBAR SPINE WITHOUT CONTRAST TECHNIQUE: Multiplanar, multisequence MR imaging of the lumbar spine was performed. No intravenous contrast was administered. COMPARISON:  CT Abdomen and Pelvis 12/18/2020. Lumbar MRI 12/11/2006. FINDINGS: Segmentation:  Normal. Alignment: Mildly increased lumbar lordosis since  2008. Mild grade 1 anterolisthesis of L4 on L5, 2-3 mm is new since that time. Similar subtle anterolisthesis of L5 on S1. Vertebrae: No marrow edema or evidence of acute osseous abnormality. Visualized bone marrow signal is within normal limits. Intact visible sacrum and SI joints. Conus medullaris and cauda equina: Conus extends to the T12-L1 level. No lower spinal cord or conus signal abnormality. Paraspinal and other soft tissues: Visible abdominal and pelvic viscera appear stable since yesterday. There is streaky inflammatory signal in the left lower lumbar erector spinae muscles on series 7 images 13 and 17, more pronounced at the L5 transverse process level. Negative lumbar paraspinal soft tissues otherwise. Disc levels: T11-T12: Negative. T12-L1:  Mild disc bulging.  No stenosis. L1-L2: Negative disc. Mild facet hypertrophy. Mild degenerative increased STIR signal in the interspinous ligament. No stenosis. L2-L3: Circumferential, mostly foraminal disc bulging. Mild to moderate facet hypertrophy greater on the left. Trace degenerative facet and interspinous ligament fluid signal. No spinal stenosis. Moderate bilateral L2 foraminal stenosis. L3-L4: Mild disc bulging and facet hypertrophy. Mild left L3 foraminal stenosis. L4-L5: Mild anterolisthesis with circumferential disc/pseudo disc. Broad-based posterior component. Moderate ligament flavum and moderate to severe facet hypertrophy. Moderate to severe spinal stenosis. At least moderate bilateral lateral recess stenosis (L5 nerve levels). Mild to moderate left L4 neural foraminal stenosis. L5-S1: Subtle  anterolisthesis and disc bulging. Moderate facet hypertrophy. No spinal or lateral recess stenosis. Mild to moderate bilateral L5 foraminal stenosis. IMPRESSION: 1. Inflammatory signal in the left lower erector spinae muscle at L5 and S1, and adjacent to the left L5 transverse process is nonspecific but more resembles degenerative or posttraumatic muscle edema than infection. No paraspinal fluid collection. No evidence of discitis osteomyelitis. 2. L4-L5 moderate to severe spinal, at least moderate lateral recess, and up to moderate bilateral neural foraminal stenosis related to disc and posterior element degeneration in the setting of grade 1 spondylolisthesis. 3. Mild L5-S1 spondylolisthesis with up to moderate bilateral L5 foraminal stenosis. 4. L2-L3 disc bulging with moderate bilateral L2 neural foraminal stenosis. Electronically Signed   By: Genevie Ann M.D.   On: 12/19/2020 10:58   MR HIP LEFT WO CONTRAST  Result Date: 12/19/2020 CLINICAL DATA:  Soft tissue infection. EXAM: MR OF THE LEFT HIP WITHOUT CONTRAST TECHNIQUE: Multiplanar, multisequence MR imaging was performed. No intravenous contrast was administered. COMPARISON:  None. FINDINGS: Bones: No hip fracture, dislocation or avascular necrosis. No periosteal reaction or bone destruction. No aggressive osseous lesion. Small left SI joint effusion with adjacent soft tissue edema anteriorly and posteriorly in the adjacent multifidus muscle concerning for septic arthritis of the left SI joint. 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 Articular cartilage and labrum Articular cartilage: Partial-thickness cartilage loss of the left femoral head and acetabulum. Labrum: Limited evaluation secondary lack of intra-articular contrast. Degeneration of the left superior labrum. Joint or bursal effusion Joint effusion:  No hip joint effusion. Bursae:  No bursa formation. Muscles and tendons Flexors: Normal. Extensors: Normal. Abductors: Normal. Adductors: Normal.  Gluteals: Moderate tendinosis of the left gluteus medius tendon insertion. Mild tendinosis of the left gluteus minimus tendon insertion. Partial-thickness tear of the right gluteus medius tendon insertion. Hamstrings: Mild tendinosis of the left hamstring origin with a partial-thickness tear. Small partial-thickness tear of the right hamstring origin. Other findings Small amount of pelvic free fluid. No fluid collection or hematoma. No inguinal lymphadenopathy. No inguinal hernia. IMPRESSION: 1. Small left SI joint effusion with adjacent soft tissue edema anteriorly and posteriorly in the  adjacent multifidus muscle concerning for septic arthritis of the left SI joint. 2. Moderate tendinosis of the left gluteus medius tendon insertion. Mild tendinosis of the left gluteus minimus tendon insertion. Partial-thickness tear of the right gluteus medius tendon insertion. 3. Mild tendinosis of the left hamstring origin with a partial-thickness tear. Small partial-thickness tear of the right hamstring origin. 4. Mild osteoarthritis of the left hip. Electronically Signed   By: Kathreen Devoid   On: 12/19/2020 10:49   CT ASPIRATION  Result Date: 12/20/2020 INDICATION: 61 year old female with a history of left-sided hip pain and an MRI suggesting left SI joint sacroiliitis or septic joint. Scant fluid on the MRI. EXAM: CT GUIDED NEEDLE PLACEMENT FOR ASPIRATION MEDICATIONS: The patient is currently admitted to the hospital and receiving intravenous antibiotics. The antibiotics were administered within an appropriate time frame prior to the initiation of the procedure. ANESTHESIA/SEDATION: Fentanyl 100 mcg IV; Versed 4.0 mg IV Moderate Sedation Time:  10 minutes The patient was continuously monitored during the procedure by the interventional radiology nurse under my direct supervision. COMPLICATIONS: None PROCEDURE: Informed written consent was obtained from the patient after a thorough discussion of the procedural risks,  benefits and alternatives. All questions were addressed. Maximal Sterile Barrier Technique was utilized including caps, mask, sterile gowns, sterile gloves, sterile drape, hand hygiene and skin antiseptic. A timeout was performed prior to the initiation of the procedure. Patient was position prone position on the CT gantry table. Scout CT was acquired for planning purposes. 1% lidocaine was used for local anesthesia. After the patient is prepped and draped and anesthetized, CT guidance was used to place an 18 gauge trocar needle into the posterior aspect of the sacroiliac joint on the left with the needle tip confirmed with sequential CT images. The stylet was removed and then aspiration of very scant blood products was performed with less than 1 cc achieved. Sterile wash was then pushed through the needle which was somewhat uncomfortable for the patient given the pressure. The sterile wash was then aspirated with less than 1 cc achieved. Both of the syringes were sent for culture. Needle was removed and a sterile bandage was placed. Patient tolerated the procedure well and remained hemodynamically stable throughout. No complications were encountered and no significant blood loss IMPRESSION: Status post CT replacement for aspiration of the posterior left sacroiliac joint, yielding very scant material, less than 1 cc of fluid. Signed, Dulcy Fanny. Dellia Nims, RPVI [facility] Electronically Signed   By: Corrie Mckusick D.O.   On: 12/20/2020 11:00        Scheduled Meds: . mometasone-formoterol  2 puff Inhalation BID  . montelukast  10 mg Oral q morning  . oxyCODONE  10 mg Oral Q12H   Continuous Infusions: . vancomycin 1,500 mg (12/21/20 0626)     LOS: 2 days    Time spent: 35 mins.More than 50% of that time was spent in counseling and/or coordination of care.      Shelly Coss, MD [facility] P5/10/2020, 7:38 AM

## 2020-12-21 NOTE — Progress Notes (Signed)
Brief pharmacy note: Lovenox for VTE prophylaxis  TBW 106.2 kg  Lovenox 70m SQ q24  S/o protocol, will adjust if needed  Thank you,  GMinda DittoPharmD 12/21/2020, 10:23 AM

## 2020-12-21 NOTE — TOC Initial Note (Addendum)
Transition of Care [facility]) - Initial/Assessment Note    Patient Details  Name: Nicole Hines MRN: [id number] Date of Birth: December 07, 1960  Transition of Care [facility]) CM/SW Contact:    Dessa Phi, RN Phone Number: 12/21/2020, 2:31 PM  Clinical Narrative:Patient from home caregiver for her Coralee [place] will have other famiy to care for him while patient stays @ different home address @ d/c:[address] [place].Health insurance copied to be sent to admitting-[facility]. For home iv abx-will check with [facility] agencies that can accept insurance-Not able to accept:[facility]. Pam w/[employer]-iv abx will follow.   2:55p-Patient declines SNF wants home w/HHC/home iv abx. 3:30p-No [place] agency able to accept. [facility] [employer]- Will use [place] for [facility] only-spoke to patient/dtr [name]-they understand but willing to go to rehab if not progressing wee enough to d/c hme safely. Expected Discharge Plan: [place] Barriers to Discharge: Continued Medical Work up,ED Psych evaluation   Patient Goals and CMS Choice Patient states their goals for this hospitalization and ongoing recovery are:: go home CMS Medicare.gov Compare Post Acute Care list provided to:: Patient Choice offered to / list presented to : Patient  Expected Discharge Plan and Services Expected Discharge Plan: [place]   Discharge Planning Services: CM Consult Post Acute Care Choice: [place] arrangements for the past 2 months: Single Family Home                                      Prior Living Arrangements/Services Living arrangements for the past 2 months: Single Family Home Lives with:: Relatives Patient language and need for interpreter reviewed:: Yes Do you feel safe going back to the place where you live?: Yes      Need for Family Participation in Patient Care: No (Comment) Care giver support system in place?: Yes (comment)   Criminal Activity/Legal  Involvement Pertinent to Current Situation/Hospitalization: No - Comment as needed  Activities of Daily Living Home Assistive Devices/Equipment: None ADL Screening (condition at time of admission) Patient's cognitive ability adequate to safely complete daily activities?: Yes Is the patient deaf or have difficulty hearing?: No Does the patient have difficulty seeing, even when wearing glasses/contacts?: No Does the patient have difficulty concentrating, remembering, or making decisions?: No Patient able to express need for assistance with ADLs?: Yes Does the patient have difficulty dressing or bathing?: No Independently performs ADLs?: Yes (appropriate for developmental age) Does the patient have difficulty walking or climbing stairs?: No (not before current problem) Weakness of Legs: None Weakness of Arms/Hands: None  Permission Sought/Granted Permission sought to share information with : Case Manager Permission granted to share information with : Yes, Verbal Permission Granted  Share Information with NAME: Case Manager     Permission granted to share info w RelationshipStanton Kidney dtr [phone]     Emotional Assessment Appearance:: Appears stated age Attitude/Demeanor/Rapport: Gracious Affect (typically observed): Accepting Orientation: : Oriented to Self,Oriented to Place,Oriented to  Time,Oriented to Situation Alcohol / Substance Use: Not Applicable Psych Involvement: No (comment)  Admission diagnosis:  Hip pain [M25.559] LLQ abdominal pain [R10.32] Left hip pain [M25.552] SIRS (systemic inflammatory response syndrome) (HCC) [R65.10] Fever, unspecified fever cause [R50.9] Septic joint ([place]) [M00.9] Patient Active Problem List   Diagnosis Date Noted  . Left hip pain 12/19/2020  . Septic joint ([place]) 12/19/2020  . SIRS (systemic inflammatory response syndrome) ([place]) 12/18/2020  .  MRSA (methicillin resistant Staphylococcus aureus) carrier 02/17/2014    Class: History of  .  Morbid obesity ([place]) 01/08/2014  . Special screening for malignant neoplasms, colon 01/08/2014   PCP:  [name], MD Pharmacy:   [id number] [id number]: [phone]: [phone]    Social Determinants of Health (SDOH) Interventions    Readmission Risk Interventions No flowsheet data found.

## 2020-12-21 NOTE — Progress Notes (Signed)
Per new orders IV Toradol given 30 mg given @ 11:13 am. Pt is now reporting left hip pain has decreased to a "6" on the pain scale. Maintain current plan of care for the Pt.

## 2020-12-21 NOTE — Progress Notes (Signed)
Pt reporting no relief of Left hip pain after IV Dilaudid 1 mg given at 9:39 am. MD updated and new orders to be placed. Pt updated

## 2020-12-21 NOTE — Progress Notes (Signed)
Subjective: Patient more comfortable after recent muscle relaxant   Antibiotics:  Anti-infectives (From admission, onward)   Start     Dose/Rate Route Frequency Ordered Stop   12/20/20 1900  vancomycin (VANCOREADY) IVPB 1500 mg/300 mL        1,500 mg 150 mL/hr over 120 Minutes Intravenous Every 12 hours 12/20/20 0613     12/20/20 0645  vancomycin (VANCOREADY) IVPB 2000 mg/400 mL        2,000 mg 200 mL/hr over 120 Minutes Intravenous  Once 12/20/20 0554 12/20/20 0834      Medications: Scheduled Meds: . enoxaparin (LOVENOX) injection  50 mg Subcutaneous Q24H  . gabapentin  300 mg Oral QHS  . mometasone-formoterol  2 puff Inhalation BID  . montelukast  10 mg Oral q morning  . pantoprazole  40 mg Oral Daily   Continuous Infusions: . vancomycin 1,500 mg (12/21/20 0626)   PRN Meds:.acetaminophen **OR** acetaminophen, ALPRAZolam, HYDROmorphone (DILAUDID) injection, ketorolac, ondansetron (ZOFRAN) IV, oxyCODONE    Objective: Weight change:   Intake/Output Summary (Last 24 hours) at 12/21/2020 1632 Last data filed at 12/21/2020 0934 Gross per 24 hour  Intake --  Output 1350 ml  Net -1350 ml   Blood pressure 104/67, pulse 67, temperature 99.9 F (37.7 C), resp. rate 18, height 5' 7"  (1.702 m), weight 106.2 kg, last menstrual period 02/18/2014, SpO2 94 %. Temp:  [98.4 F (36.9 C)-100.1 F (37.8 C)] 99.9 F (37.7 C) (05/03 1350) Pulse Rate:  [67-87] 67 (05/03 1350) Resp:  [18-20] 18 (05/03 1350) BP: (104-140)/(67-78) 104/67 (05/03 1350) SpO2:  [94 %-98 %] 94 % (05/03 1350)  Physical Exam: Physical Exam Constitutional:      General: She is not in acute distress.    Appearance: She is well-developed. She is not diaphoretic.  HENT:     Head: Normocephalic and atraumatic.     Right Ear: External ear normal.     Left Ear: External ear normal.     Mouth/Throat:     Pharynx: No oropharyngeal exudate.  Eyes:     General: No scleral icterus.     Conjunctiva/sclera: Conjunctivae normal.     Pupils: Pupils are equal, round, and reactive to light.  Cardiovascular:     Rate and Rhythm: Normal rate and regular rhythm.     Heart sounds: Murmur heard.  No friction rub. No gallop.   Pulmonary:     Effort: Pulmonary effort is normal. No respiratory distress.     Breath sounds: Normal breath sounds. No wheezing, rhonchi or rales.  Abdominal:     General: Bowel sounds are normal. There is no distension.     Palpations: Abdomen is soft.     Tenderness: There is no abdominal tenderness. There is no rebound.  Lymphadenopathy:     Cervical: No cervical adenopathy.  Skin:    General: Skin is warm and dry.     Coloration: Skin is not pale.     Findings: No erythema or rash.  Neurological:     General: No focal deficit present.     Mental Status: She is alert and oriented to person, place, and time.     Motor: No abnormal muscle tone.  Psychiatric:        Attention and Perception: Attention normal.        Mood and Affect: Mood is depressed.        Behavior: Behavior normal.        Thought Content: Thought content  normal.        Cognition and Memory: Cognition and memory normal.        Judgment: Judgment normal.      CBC:    BMET Recent Labs    12/19/20 0751 12/21/20 0503  NA 139 134*  K 4.0 3.8  CL 104 97*  CO2 26 26  GLUCOSE 185* 128*  BUN 17 12  CREATININE 0.59 0.51  CALCIUM 9.7 9.8     Liver Panel  Recent Labs    12/19/20 0751  PROT 8.0  ALBUMIN 3.4*  AST 25  ALT 25  ALKPHOS 69  BILITOT 0.8       Sedimentation Rate Recent Labs    12/21/20 0503  ESRSEDRATE 114*   C-Reactive Protein Recent Labs    12/21/20 0503  CRP 30.2*    Micro Results: Recent Results (from the past 720 hour(s))  Resp Panel by RT-PCR (Flu A&B, Covid) Nasopharyngeal Swab     Status: None   Collection Time: 12/18/20  9:10 PM   Specimen: Nasopharyngeal Swab; Nasopharyngeal(NP) swabs in vial transport medium  Result Value  Ref Range Status   SARS Coronavirus 2 by RT PCR NEGATIVE NEGATIVE Final    Comment: (NOTE) SARS-CoV-2 target nucleic acids are NOT DETECTED.  The SARS-CoV-2 RNA is generally detectable in upper respiratory specimens during the acute phase of infection. The lowest concentration of SARS-CoV-2 viral copies this assay can detect is 138 copies/mL. A negative result does not preclude SARS-Cov-2 infection and should not be used as the sole basis for treatment or other patient management decisions. A negative result may occur with  improper specimen collection/handling, submission of specimen other than nasopharyngeal swab, presence of viral mutation(s) within the areas targeted by this assay, and inadequate number of viral copies(<138 copies/mL). A negative result must be combined with clinical observations, patient history, and epidemiological information. The expected result is Negative.  Fact Sheet for Patients:  EntrepreneurPulse.com.au  Fact Sheet for Healthcare Providers:  IncredibleEmployment.be  This test is no t yet approved or cleared by the [place] FDA and  has been authorized for detection and/or diagnosis of SARS-CoV-2 by FDA under an Emergency Use Authorization (EUA). This EUA will remain  in effect (meaning this test can be used) for the duration of the COVID-19 declaration under Section 564(b)(1) of the Act, 21 U.S.C.section 360bbb-3(b)(1), unless the authorization is terminated  or revoked sooner.       Influenza A by PCR NEGATIVE NEGATIVE Final   Influenza B by PCR NEGATIVE NEGATIVE Final    Comment: (NOTE) The Xpert Xpress SARS-CoV-2/FLU/RSV plus assay is intended as an aid in the diagnosis of influenza from Nasopharyngeal swab specimens and should not be used as a sole basis for treatment. Nasal washings and aspirates are unacceptable for Xpert Xpress SARS-CoV-2/FLU/RSV testing.  Fact Sheet for  Patients: EntrepreneurPulse.com.au  Fact Sheet for Healthcare Providers: IncredibleEmployment.be  This test is not yet approved or cleared by the [place] FDA and has been authorized for detection and/or diagnosis of SARS-CoV-2 by FDA under an Emergency Use Authorization (EUA). This EUA will remain in effect (meaning this test can be used) for the duration of the COVID-19 declaration under Section 564(b)(1) of the Act, 21 U.S.C. section 360bbb-3(b)(1), unless the authorization is terminated or revoked.  Performed at [facility], [address], [place], [place] [address]   Culture, blood (routine x 2)     Status: Abnormal (Preliminary result)   Collection Time: 12/18/20 10:00 PM  Specimen: BLOOD  Result Value Ref Range Status   Specimen Description   Final    BLOOD RIGHT HAND Performed at [facility], [address] [place], [place] [address]    Special Requests   Final    BOTTLES DRAWN AEROBIC AND ANAEROBIC Blood Culture adequate volume Performed at [facility], [address] [place], [place] [address]    Culture  Setup Time   Final    GRAM POSITIVE COCCI IN BOTH AEROBIC AND ANAEROBIC BOTTLES CRITICAL RESULT CALLED TO, READ BACK BY AND VERIFIED WITH: VALERIE POWELL,RN 12/20/2020 AT 0522 A.HUGHES Performed at [facility], [place] [address] [place], [place] [address]    Culture STAPHYLOCOCCUS AUREUS (A)  Final   Report Status PENDING  Incomplete  Culture, blood (routine x 2)     Status: Abnormal (Preliminary result)   Collection Time: 12/18/20 10:00 PM   Specimen: BLOOD  Result Value Ref Range Status   Specimen Description   Final    BLOOD LEFT HAND Performed at [facility], [address], [place], [place] [address]    Special Requests   Final    BOTTLES DRAWN AEROBIC AND ANAEROBIC Blood Culture adequate volume Performed at [facility], [address], [place], [place] [address]    Culture  Setup Time   Final    GRAM POSITIVE COCCI IN BOTH AEROBIC AND ANAEROBIC BOTTLES CRITICAL RESULT CALLED TO, READ BACK BY AND VERIFIED WITH: VALERIE POWELL,RN 12/20/2020 AT [place] A.HUGHES    Culture (A)  Final    STAPHYLOCOCCUS AUREUS SUSCEPTIBILITIES TO FOLLOW Performed at [facility], [place] [address], [place], [place] [address]    Report Status PENDING  Incomplete  Blood Culture ID Panel (Reflexed)     Status: Abnormal   Collection Time: 12/18/20 10:00 PM  Result Value Ref Range Status   Enterococcus faecalis NOT DETECTED NOT DETECTED Final   Enterococcus Faecium NOT DETECTED NOT DETECTED Final   Listeria monocytogenes NOT DETECTED NOT DETECTED Final   Staphylococcus species DETECTED (A) NOT DETECTED Final    Comment: CRITICAL RESULT CALLED TO, READ BACK BY AND VERIFIED WITH: VALERIE POWELL,RN 12/20/2020 AT 0522 A.HUGHES    Staphylococcus aureus (BCID) DETECTED (A) NOT DETECTED Final    Comment: Methicillin (oxacillin)-resistant Staphylococcus aureus (MRSA). MRSA is predictably resistant to beta-lactam antibiotics (except ceftaroline). Preferred therapy is vancomycin unless clinically contraindicated. Patient requires contact precautions if  hospitalized. CRITICAL RESULT CALLED TO, READ BACK BY AND VERIFIED WITH: VALERIE POWELL,RN 12/20/2020 AT 0522 A.HUGHES    Staphylococcus epidermidis NOT DETECTED NOT DETECTED Final   Staphylococcus lugdunensis NOT DETECTED NOT DETECTED Final   Streptococcus species NOT DETECTED NOT DETECTED Final   Streptococcus agalactiae NOT DETECTED NOT DETECTED Final   Streptococcus pneumoniae NOT DETECTED NOT DETECTED Final   Streptococcus pyogenes NOT DETECTED NOT DETECTED Final   A.calcoaceticus-baumannii NOT DETECTED NOT DETECTED Final   Bacteroides fragilis NOT DETECTED NOT DETECTED Final   Enterobacterales NOT DETECTED NOT DETECTED Final   Enterobacter cloacae  complex NOT DETECTED NOT DETECTED Final   Escherichia coli NOT DETECTED NOT DETECTED Final   Klebsiella aerogenes NOT DETECTED NOT DETECTED Final   Klebsiella oxytoca NOT DETECTED NOT DETECTED Final   Klebsiella pneumoniae NOT DETECTED NOT DETECTED Final   Proteus species NOT DETECTED NOT DETECTED Final   Salmonella species NOT DETECTED NOT DETECTED Final   Serratia marcescens NOT DETECTED NOT DETECTED Final   Haemophilus influenzae NOT DETECTED NOT DETECTED Final  Neisseria meningitidis NOT DETECTED NOT DETECTED Final   Pseudomonas aeruginosa NOT DETECTED NOT DETECTED Final   Stenotrophomonas maltophilia NOT DETECTED NOT DETECTED Final   Candida albicans NOT DETECTED NOT DETECTED Final   Candida auris NOT DETECTED NOT DETECTED Final   Candida glabrata NOT DETECTED NOT DETECTED Final   Candida krusei NOT DETECTED NOT DETECTED Final   Candida parapsilosis NOT DETECTED NOT DETECTED Final   Candida tropicalis NOT DETECTED NOT DETECTED Final   Cryptococcus neoformans/gattii NOT DETECTED NOT DETECTED Final   Meth resistant mecA/C and MREJ DETECTED (A) NOT DETECTED Final    Comment: CRITICAL RESULT CALLED TO, READ BACK BY AND VERIFIED WITH: VALERIE POWELL,RN 12/20/2020 AT 0522 A.HUGHES Performed at [facility], [place] [address] [place], [place] [address]   Aerobic/Anaerobic Culture (surgical/deep wound)     Status: None (Preliminary result)   Collection Time: 12/20/20 10:02 AM   Specimen: Abscess  Result Value Ref Range Status   Specimen Description   Final    ABSCESS LT SI JOINT ASPIRATION Performed at [facility] [address], [place], [place] [address]    Special Requests   Final    NONE Performed at [facility], [place] [address] [place], [place] [address]    Gram Stain NO WBC SEEN RARE GRAM POSITIVE COCCI   Final   Culture   Final    FEW STAPHYLOCOCCUS AUREUS SUSCEPTIBILITIES TO FOLLOW Performed at [facility], [place] [address], [place], [place] [address]    Report Status PENDING  Incomplete  Culture, blood (routine x 2)     Status: None (Preliminary result)   Collection Time: 12/21/20 12:09 PM   Specimen: BLOOD  Result Value Ref Range Status   Specimen Description   Final    BLOOD LEFT ANTECUBITAL Performed at [facility] [address], [place], [place] [address]    Special Requests   Final    BOTTLES DRAWN AEROBIC AND ANAEROBIC Blood Culture results may not be optimal due to an excessive volume of blood received in culture bottles Performed at [facility] [address] [place],  [address]    Culture PENDING  Incomplete   Report Status PENDING  Incomplete    Studies/Results: CT ASPIRATION  Result Date: 12/20/2020 INDICATION: 60 year old female with a history of left-sided hip pain and an MRI suggesting left SI joint sacroiliitis or septic joint. Scant fluid on the MRI. EXAM: CT GUIDED NEEDLE PLACEMENT FOR ASPIRATION MEDICATIONS: The patient is currently admitted to the hospital and receiving intravenous antibiotics. The antibiotics were administered within an appropriate time frame prior to the initiation of the procedure. ANESTHESIA/SEDATION: Fentanyl 100 mcg IV; Versed 4.0 mg IV Moderate Sedation Time:  10 minutes The patient was continuously monitored during the procedure by the interventional radiology nurse under my direct supervision. COMPLICATIONS: None PROCEDURE: Informed written consent was obtained from the patient after a thorough discussion of the procedural risks, benefits and alternatives. All questions were addressed. Maximal Sterile Barrier Technique was utilized including caps, mask, sterile gowns, sterile gloves, sterile drape, hand hygiene and skin antiseptic. A timeout was performed prior to the initiation of the procedure. Patient was position prone position on the CT gantry table. Scout CT was acquired for planning purposes. 1% lidocaine was used for  local anesthesia. After the patient is prepped and draped and anesthetized, CT guidance was used to place an 18 gauge trocar needle into the posterior aspect of the sacroiliac  joint on the left with the needle tip confirmed with sequential CT images. The stylet was removed and then aspiration of very scant blood products was performed with less than 1 cc achieved. Sterile wash was then pushed through the needle which was somewhat uncomfortable for the patient given the pressure. The sterile wash was then aspirated with less than 1 cc achieved. Both of the syringes were sent for culture. Needle was removed and a sterile bandage was placed. Patient tolerated the procedure well and remained hemodynamically stable throughout. No complications were encountered and no significant blood loss IMPRESSION: Status post CT replacement for aspiration of the posterior left sacroiliac joint, yielding very scant material, less than 1 cc of fluid. Signed, Dulcy Fanny. Dellia Nims, RPVI [facility] Radiology Electronically Signed   By: Corrie Mckusick D.O.   On: 12/20/2020 11:00      Assessment/Plan:  INTERVAL HISTORY: Aspirate from SI joint is growing Staph aureus   Principal Problem:   MRSA (methicillin resistant Staphylococcus aureus) carrier Active Problems:   SIRS (systemic inflammatory response syndrome) (HCC)   Left hip pain   Septic joint (HCC)    Andreika Vandagriff Raney is a 60 y.o. 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  Continue vancomycin Repeat blood cultures taken this morning 2D echocardiogram pending and she will likely need TEE   LOS: 2 days   Alcide Evener 12/21/2020, 4:32 PM

## 2020-12-22 ENCOUNTER — Inpatient Hospital Stay (HOSPITAL_COMMUNITY): Payer: PRIVATE HEALTH INSURANCE

## 2020-12-22 DIAGNOSIS — R7881 Bacteremia: Secondary | ICD-10-CM | POA: Diagnosis not present

## 2020-12-22 LAB — CULTURE, BLOOD (ROUTINE X 2)
Special Requests: ADEQUATE
Special Requests: ADEQUATE

## 2020-12-22 LAB — ECHOCARDIOGRAM COMPLETE
AR max vel: 1.02 cm2
AV Area VTI: 1.05 cm2
AV Area mean vel: 1.04 cm2
AV Mean grad: 28.5 mmHg
AV Peak grad: 51.6 mmHg
Ao pk vel: 3.59 m/s
Area-P 1/2: 2.66 cm2
Height: 67 in
S' Lateral: 3.96 cm
Weight: 3746.06 oz

## 2020-12-22 MED ORDER — LIP MEDEX EX OINT
TOPICAL_OINTMENT | CUTANEOUS | Status: AC
  Filled 2020-12-22: qty 7

## 2020-12-22 NOTE — Progress Notes (Signed)
  Echocardiogram 2D Echocardiogram has been performed.  Jennette Dubin 12/22/2020, 3:35 PM

## 2020-12-22 NOTE — TOC Progression Note (Addendum)
Transition of Care [facility]) - Progression Note    Patient Details  Name: Nicole Hines MRN: [id number] Date of Birth: 18-Mar-1961  [facility] of [facility]) CM/SW Contact  [name], [name], RN Phone Number: 12/22/2020, 10:18 AM  Clinical Narrative:Another referral for SNF-patient currently pleasantly declines SNF-wants home w/HHC-will check benefits for [facility] agency-currently no [name] agency to accept. [employer] rep Pam-home iv infusion;[place] [facility] only. Spoke to dtr [name] informed of patients decision-[name] will asst at home but is concerned about ambulation progress. Re assured that CM will continue to follow but will be unable to fax out info without patient's agreement-[name] voiced understanding.  12:35p-Admitting has confirmed health insurance-benefit has been checked for in network [facility] agency-no [place] benefit, or in network-we must ask if any HHC will accept-currently no [place] agency to accept. Informed dtr/patient.   3p-[name]) wants to participate in therapy while in hospital to prepare for home-PT/OT-please contact dtr to coordinate visits for teaching.MD please update [name].She will try to get [facility] on own, also needs RW. CM will contact [employer] to asst w/DME.    Expected Discharge Plan: [place] Continued Medical work up  Expected Discharge Plan and Services Expected Discharge Plan: [place]   Discharge Planning Services: CM Consult Post Acute Care Choice: [place] arrangements for the past 2 months: Single Family Home                                       Social Determinants of Health (SDOH) Interventions    Readmission Risk Interventions No flowsheet data found.

## 2020-12-22 NOTE — Progress Notes (Signed)
PT Cancellation Note  Patient Details Name: DAILYN REITH MRN: [id number] DOB: 12/17/1960   Cancelled Treatment:     Pt got OOB via Occupational Therapy.  RN and ECHO tech assisting back to bed + 2 assist and with audible moaning with pain heard even with door closed.  Pt receiving ECHO test next 30 min.  Will attempt to see another time.   Rica Koyanagi  PTA Acute  Rehabilitation Services Pager      [phone] Office      [phone]

## 2020-12-22 NOTE — Progress Notes (Signed)
PROGRESS NOTE    Nicole Hines  WUJ:[id number] DOB: 22-Jul-1961 DOA: 12/18/2020 PCP: [name], MD   Chief Complain: Left hip pain  Brief Narrative: Patient is a 60 year old female with history of chronic knee pain, asthma who presented to the emergency department at [facility] with complaint of worsening left hip pain, inability to ambulate due to pain.  In the emergency department she was found to have fever, lab work showed leukocytosis.  MRI revealed left septic sacroiliac joint.  Orthopedics, IR , ID consulted.  Underwent  CT-guided aspiration of left on 12/20/20.  Blood culture showed MRSA,on vancomycin.Plan for TTE/TEE.  PT/OT recommended SN facility on discharge.  Assessment & Plan:   Principal Problem:   MRSA (methicillin resistant Staphylococcus aureus) carrier Active Problems:   SIRS (systemic inflammatory response syndrome) (HCC)   Hip pain   Septic joint (HCC)   Chronic SI joint pain   Fever   Acute bacterial endocarditis   Left septic sacroiliac joint: Presented with fever, left hip pain.  MRI of the lumbar spine showed left septic sacroiliac joint.  IR, orthopedics, ID consulted. Underwent  CT-guided aspiration of left hip on 12/20/20 Follow-up final report joint fluid culture, prelim showing rare gram-positive cocci.  Patient on vancomycin. Continue pain management  MRSA bacteremia: Presented with fever, leukocytosis.  Source is left hip. .  Currently on vancomycin.  Follow final culture and sensitivity.  Leukocytosis improved.  Elevated CRP She will need TTE/TEE.  She has a pansystolic murmur.  Repeat blood culture sent on 12/21/2020, no growth till date  Chronic pain syndrome: Continue supportive care, pain medications  History of asthma: Currently saturating fine on room air.  No wheezing  Debility/deconditioning/left hip pain:  PT/OT recommended skilled nursing facility on discharge.  Morbid obesity: BMI of 36.6           DVT  prophylaxis:lovenox Code Status: Full Family Communication: Called and discussed with daughter on phone on 12/21/20 Status is: Inpatient  Remains inpatient appropriate because:Inpatient level of care appropriate due to severity of illness   Dispo: The patient is from: Home              Anticipated d/c is to: Home              Patient currently is not medically stable to d/c.   Difficult to place patient No      Consultants: ID, orthopedics, IR  Procedures:  Antimicrobials:  Anti-infectives (From admission, onward)   Start     Dose/Rate Route Frequency Ordered Stop   12/20/20 1900  vancomycin (VANCOREADY) IVPB 1500 mg/300 mL        1,500 mg 150 mL/hr over 120 Minutes Intravenous Every 12 hours 12/20/20 0613     12/20/20 0645  vancomycin (VANCOREADY) IVPB 2000 mg/400 mL        2,000 mg 200 mL/hr over 120 Minutes Intravenous  Once 12/20/20 0554 12/20/20 0834      Subjective:  Patient seen and examined at bedside this morning.  Hemodynamically stable.  Still has left hip discomfort but pain is significantly better than yesterday.  Denies any new complaints.  Afebrile.  Objective: Vitals:   12/21/20 1350 12/21/20 2028 12/21/20 2113 12/22/20 0631  BP: 104/67  140/70 (!) 160/81  Pulse: 67  71 70  Resp: 18  18 20   Temp: 99.9 F (37.7 C)  100 F (37.8 C) 98.5 F (36.9 C)  TempSrc:      SpO2: 94% 98% 98% 100%  Weight:      Height:        Intake/Output Summary (Last 24 hours) at 12/22/2020 0733 Last data filed at 12/22/2020 0632 Gross per 24 hour  Intake 120 ml  Output 1250 ml  Net -1130 ml   Filed Weights   12/18/20 1027 12/19/20 0236  Weight: 113.4 kg 106.2 kg    Examination:  General exam: Overall comfortable, not in distress, morbidly obese HEENT: PERRL Respiratory system:  no wheezes or crackles  Cardiovascular system: S1 & S2 heard, RRR.  Gastrointestinal system: Abdomen is nondistended, soft and nontender. Central nervous system: Alert and  oriented Extremities: No edema, no clubbing ,no cyanosis Skin: No rashes, no ulcers,no icterus   Data Reviewed: I have personally reviewed following labs and imaging studies  CBC: Recent Labs  Lab 12/18/20 1105 12/19/20 0751 12/21/20 0503  WBC 16.0* 15.5* 13.8*  NEUTROABS 13.6* 13.5* 10.8*  HGB 12.5 12.1 13.3  HCT 37.9 36.7 41.7  MCV 92.7 93.9 94.8  PLT 205 191 841   Basic Metabolic Panel: Recent Labs  Lab 12/18/20 1105 12/19/20 0751 12/21/20 0503  NA 137 139 134*  K 3.1* 4.0 3.8  CL 101 104 97*  CO2 27 26 26   GLUCOSE 158* 185* 128*  BUN 15 17 12   CREATININE 0.54 0.59 0.51  CALCIUM 9.7 9.7 9.8   GFR: Estimated Creatinine Clearance: 94.9 mL/min (by C-G formula based on SCr of 0.51 mg/dL). Liver Function Tests: Recent Labs  Lab 12/18/20 1105 12/19/20 0751  AST 15 25  ALT 16 25  ALKPHOS 63 69  BILITOT 0.9 0.8  PROT 7.6 8.0  ALBUMIN 4.2 3.4*   No results for input(s): LIPASE, AMYLASE in the last 168 hours. No results for input(s): AMMONIA in the last 168 hours. Coagulation Profile: No results for input(s): INR, PROTIME in the last 168 hours. Cardiac Enzymes: No results for input(s): CKTOTAL, CKMB, CKMBINDEX, TROPONINI in the last 168 hours. BNP (last 3 results) No results for input(s): PROBNP in the last 8760 hours. HbA1C: No results for input(s): HGBA1C in the last 72 hours. CBG: No results for input(s): GLUCAP in the last 168 hours. Lipid Profile: No results for input(s): CHOL, HDL, LDLCALC, TRIG, CHOLHDL, LDLDIRECT in the last 72 hours. Thyroid Function Tests: No results for input(s): TSH, T4TOTAL, FREET4, T3FREE, THYROIDAB in the last 72 hours. Anemia Panel: No results for input(s): VITAMINB12, FOLATE, FERRITIN, TIBC, IRON, RETICCTPCT in the last 72 hours. Sepsis Labs: Recent Labs  Lab 12/18/20 2154  LATICACIDVEN 0.6    Recent Results (from the past 240 hour(s))  Resp Panel by RT-PCR (Flu A&B, Covid) Nasopharyngeal Swab     Status: None    Collection Time: 12/18/20  9:10 PM   Specimen: Nasopharyngeal Swab; Nasopharyngeal(NP) swabs in vial transport medium  Result Value Ref Range Status   SARS Coronavirus 2 by RT PCR NEGATIVE NEGATIVE Final    Comment: (NOTE) SARS-CoV-2 target nucleic acids are NOT DETECTED.  The SARS-CoV-2 RNA is generally detectable in upper respiratory specimens during the acute phase of infection. The lowest concentration of SARS-CoV-2 viral copies this assay can detect is 138 copies/mL. A negative result does not preclude SARS-Cov-2 infection and should not be used as the sole basis for treatment or other patient management decisions. A negative result may occur with  improper specimen collection/handling, submission of specimen other than nasopharyngeal swab, presence of viral mutation(s) within the areas targeted by this assay, and inadequate number of viral copies(<138 copies/mL). A negative result must  be combined with clinical observations, patient history, and epidemiological information. The expected result is Negative.  Fact Sheet for Patients:  EntrepreneurPulse.com.au  Fact Sheet for Healthcare Providers:  IncredibleEmployment.be  This test is no t yet approved or cleared by the [place] FDA and  has been authorized for detection and/or diagnosis of SARS-CoV-2 by FDA under an Emergency Use Authorization (EUA). This EUA will remain  in effect (meaning this test can be used) for the duration of the COVID-19 declaration under Section 564(b)(1) of the Act, 21 U.S.C.section 360bbb-3(b)(1), unless the authorization is terminated  or revoked sooner.       Influenza A by PCR NEGATIVE NEGATIVE Final   Influenza B by PCR NEGATIVE NEGATIVE Final    Comment: (NOTE) The Xpert Xpress SARS-CoV-2/FLU/RSV plus assay is intended as an aid in the diagnosis of influenza from Nasopharyngeal swab specimens and should not be used as a sole basis for treatment.  Nasal washings and aspirates are unacceptable for Xpert Xpress SARS-CoV-2/FLU/RSV testing.  Fact Sheet for Patients: EntrepreneurPulse.com.au  Fact Sheet for Healthcare Providers: IncredibleEmployment.be  This test is not yet approved or cleared by the [place] FDA and has been authorized for detection and/or diagnosis of SARS-CoV-2 by FDA under an Emergency Use Authorization (EUA). This EUA will remain in effect (meaning this test can be used) for the duration of the COVID-19 declaration under Section 564(b)(1) of the Act, 21 U.S.C. section 360bbb-3(b)(1), unless the authorization is terminated or revoked.  Performed at [facility], [address], [place], [place] [address]   Culture, blood (routine x 2)     Status: Abnormal (Preliminary result)   Collection Time: 12/18/20 10:00 PM   Specimen: BLOOD  Result Value Ref Range Status   Specimen Description   Final    BLOOD RIGHT HAND Performed at [facility], [address], [place], [place] [address]    Special Requests   Final    BOTTLES DRAWN AEROBIC AND ANAEROBIC Blood Culture adequate volume Performed at [facility], [address], [place], [place] [address]    Culture  Setup Time   Final    GRAM POSITIVE COCCI IN BOTH AEROBIC AND ANAEROBIC BOTTLES CRITICAL RESULT CALLED TO, READ BACK BY AND VERIFIED WITH: VALERIE POWELL,RN 12/20/2020 AT 0522 A.HUGHES Performed at [facility], [place] [address], [place], [place] [address]    Culture STAPHYLOCOCCUS AUREUS (A)  Final   Report Status PENDING  Incomplete  Culture, blood (routine x 2)     Status: Abnormal (Preliminary result)   Collection Time: 12/18/20 10:00 PM   Specimen: BLOOD  Result Value Ref Range Status   Specimen Description   Final    BLOOD LEFT HAND Performed at [facility], [address] [place], [place] [address]    Special  Requests   Final    BOTTLES DRAWN AEROBIC AND ANAEROBIC Blood Culture adequate volume Performed at [facility], [address], [place], [place] [address]    Culture  Setup Time   Final    GRAM POSITIVE COCCI IN BOTH AEROBIC AND ANAEROBIC BOTTLES CRITICAL RESULT CALLED TO, READ BACK BY AND VERIFIED WITH: VALERIE POWELL,RN 12/20/2020 AT [place] A.HUGHES    Culture (A)  Final    STAPHYLOCOCCUS AUREUS SUSCEPTIBILITIES TO FOLLOW Performed at [facility], [place] [address] [place],  [address]    Report Status PENDING  Incomplete  Blood Culture ID Panel (Reflexed)     Status: Abnormal   Collection  Time: 12/18/20 10:00 PM  Result Value Ref Range Status   Enterococcus faecalis NOT DETECTED NOT DETECTED Final   Enterococcus Faecium NOT DETECTED NOT DETECTED Final   Listeria monocytogenes NOT DETECTED NOT DETECTED Final   Staphylococcus species DETECTED (A) NOT DETECTED Final    Comment: CRITICAL RESULT CALLED TO, READ BACK BY AND VERIFIED WITH: VALERIE POWELL,RN 12/20/2020 AT 0522 A.HUGHES    Staphylococcus aureus (BCID) DETECTED (A) NOT DETECTED Final    Comment: Methicillin (oxacillin)-resistant Staphylococcus aureus (MRSA). MRSA is predictably resistant to beta-lactam antibiotics (except ceftaroline). Preferred therapy is vancomycin unless clinically contraindicated. Patient requires contact precautions if  hospitalized. CRITICAL RESULT CALLED TO, READ BACK BY AND VERIFIED WITH: VALERIE POWELL,RN 12/20/2020 AT 0522 A.HUGHES    Staphylococcus epidermidis NOT DETECTED NOT DETECTED Final   Staphylococcus lugdunensis NOT DETECTED NOT DETECTED Final   Streptococcus species NOT DETECTED NOT DETECTED Final   Streptococcus agalactiae NOT DETECTED NOT DETECTED Final   Streptococcus pneumoniae NOT DETECTED NOT DETECTED Final   Streptococcus pyogenes NOT DETECTED NOT DETECTED Final   A.calcoaceticus-baumannii NOT DETECTED NOT DETECTED Final   Bacteroides fragilis  NOT DETECTED NOT DETECTED Final   Enterobacterales NOT DETECTED NOT DETECTED Final   Enterobacter cloacae complex NOT DETECTED NOT DETECTED Final   Escherichia coli NOT DETECTED NOT DETECTED Final   Klebsiella aerogenes NOT DETECTED NOT DETECTED Final   Klebsiella oxytoca NOT DETECTED NOT DETECTED Final   Klebsiella pneumoniae NOT DETECTED NOT DETECTED Final   Proteus species NOT DETECTED NOT DETECTED Final   Salmonella species NOT DETECTED NOT DETECTED Final   Serratia marcescens NOT DETECTED NOT DETECTED Final   Haemophilus influenzae NOT DETECTED NOT DETECTED Final   Neisseria meningitidis NOT DETECTED NOT DETECTED Final   Pseudomonas aeruginosa NOT DETECTED NOT DETECTED Final   Stenotrophomonas maltophilia NOT DETECTED NOT DETECTED Final   Candida albicans NOT DETECTED NOT DETECTED Final   Candida auris NOT DETECTED NOT DETECTED Final   Candida glabrata NOT DETECTED NOT DETECTED Final   Candida krusei NOT DETECTED NOT DETECTED Final   Candida parapsilosis NOT DETECTED NOT DETECTED Final   Candida tropicalis NOT DETECTED NOT DETECTED Final   Cryptococcus neoformans/gattii NOT DETECTED NOT DETECTED Final   Meth resistant mecA/C and MREJ DETECTED (A) NOT DETECTED Final    Comment: CRITICAL RESULT CALLED TO, READ BACK BY AND VERIFIED WITH: VALERIE POWELL,RN 12/20/2020 AT 0522 A.HUGHES Performed at [facility], [place] [address] [place], [place] [address]   Aerobic/Anaerobic Culture (surgical/deep wound)     Status: None (Preliminary result)   Collection Time: 12/20/20 10:02 AM   Specimen: Abscess  Result Value Ref Range Status   Specimen Description   Final    ABSCESS LT SI JOINT ASPIRATION Performed at [facility] [address] [place], [place] [address]    Special Requests   Final    NONE Performed at [facility], [place] [address] [place], [place] [address]    Gram Stain NO WBC SEEN RARE GRAM POSITIVE COCCI   Final   Culture    Final    FEW STAPHYLOCOCCUS AUREUS SUSCEPTIBILITIES TO FOLLOW Performed at [facility], [place] [address] [place], [place] [address]    Report Status PENDING  Incomplete  Culture, blood (routine x 2)     Status: None (Preliminary result)   Collection Time: 12/21/20 12:09 PM   Specimen: BLOOD  Result Value Ref Range Status   Specimen Description   Final  BLOOD LEFT ANTECUBITAL Performed at [facility] [address], [place], [place] [address]    Special Requests   Final    BOTTLES DRAWN AEROBIC AND ANAEROBIC Blood Culture results may not be optimal due to an excessive volume of blood received in culture bottles Performed at [facility] [address], [place], [place] [address]    Culture PENDING  Incomplete   Report Status PENDING  Incomplete         Radiology Studies: CT ASPIRATION  Result Date: 12/20/2020 INDICATION: 60 year old female with a history of left-sided hip pain and an MRI suggesting left SI joint sacroiliitis or septic joint. Scant fluid on the MRI. EXAM: CT GUIDED NEEDLE PLACEMENT FOR ASPIRATION MEDICATIONS: The patient is currently admitted to the hospital and receiving intravenous antibiotics. The antibiotics were administered within an appropriate time frame prior to the initiation of the procedure. ANESTHESIA/SEDATION: Fentanyl 100 mcg IV; Versed 4.0 mg IV Moderate Sedation Time:  10 minutes The patient was continuously monitored during the procedure by the interventional radiology nurse under my direct supervision. COMPLICATIONS: None PROCEDURE: Informed written consent was obtained from the patient after a thorough discussion of the procedural risks, benefits and alternatives. All questions were addressed. Maximal Sterile Barrier Technique was utilized including caps, mask, sterile gowns, sterile gloves, sterile drape, hand hygiene and skin antiseptic. A timeout was performed prior to the initiation of the procedure. Patient was  position prone position on the CT gantry table. Scout CT was acquired for planning purposes. 1% lidocaine was used for local anesthesia. After the patient is prepped and draped and anesthetized, CT guidance was used to place an 18 gauge trocar needle into the posterior aspect of the sacroiliac joint on the left with the needle tip confirmed with sequential CT images. The stylet was removed and then aspiration of very scant blood products was performed with less than 1 cc achieved. Sterile wash was then pushed through the needle which was somewhat uncomfortable for the patient given the pressure. The sterile wash was then aspirated with less than 1 cc achieved. Both of the syringes were sent for culture. Needle was removed and a sterile bandage was placed. Patient tolerated the procedure well and remained hemodynamically stable throughout. No complications were encountered and no significant blood loss IMPRESSION: Status post CT replacement for aspiration of the posterior left sacroiliac joint, yielding very scant material, less than 1 cc of fluid. Signed, Dulcy Fanny. Dellia Nims, RPVI [facility] Radiology Electronically Signed   By: Corrie Mckusick D.O.   On: 12/20/2020 11:00        Scheduled Meds: . enoxaparin (LOVENOX) injection  50 mg Subcutaneous Q24H  . gabapentin  300 mg Oral QHS  . mometasone-formoterol  2 puff Inhalation BID  . montelukast  10 mg Oral q morning  . pantoprazole  40 mg Oral Daily   Continuous Infusions: . vancomycin 1,500 mg (12/22/20 0633)     LOS: 3 days    Time spent: 35 mins.More than 50% of that time was spent in counseling and/or coordination of care.      Shelly Coss, MD [facility] P5/11/2020, 7:33 AM

## 2020-12-22 NOTE — Evaluation (Signed)
Occupational Therapy Evaluation Patient Details Name: Nicole Hines MRN: [id number] DOB: [birth year] Today's Date: 12/22/2020    History of Present Illness 60 year old female who presented to the emergency department at [facility] with complaint of worsening left hip pain, inability to ambulate due to pain.  In the emergency department she was found to have fever, lab work showed leukocytosis.   MRI revealed left septic sacroiliac joint. Dx of MRSA, SIRS, septic L SIJ. Pt with history of chronic knee pain, asthma   Clinical Impression   Ms. Nicole Hines is a 61 year old woman who presents with above medical history with decreased ROM and strength in LLE, generalized weakness, decreased activity tolerance, impaired balance and pain resulting in a sudden decline in functional abilities. 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 Today patient supervision to transfer to side of bed, min assist to stand from elevated bed and only able to take steps to recliner using RW. Patient needing mod- max assist for LB ADLs and toileting and set up assistance and seated positioning for UB ADLs. Patient predominantly limited by pain. Patient will benefit from skilled OT services while in hospital to improve deficits and learn compensatory strategies as needed in order to return home. Patient reports not wanting SNF and wanting to go home - though she does not have 24/7 assistance - only intermittent. Expect that patient's physical abilities will improve with pain improvement. Will need to bari/wide BSC, RW, shower chair and AE for LB ADLs.       Follow Up Recommendations  Home health OT    Equipment Recommendations   (wide/bari [facility])    Recommendations for Other Services       Precautions / Restrictions Precautions Precautions: Fall Restrictions Weight Bearing Restrictions: No      Mobility Bed Mobility Overal bed mobility: Needs Assistance Bed Mobility:  Supine to Sit     Supine to sit: Supervision;HOB elevated     General bed mobility comments: supervision to transfer to side of bed - increased time and use of bed rails needed due to pain.    Transfers Overall transfer level: Needs assistance Equipment used: Rolling walker (2 wheeled) Transfers: Sit to/from [employer] Sit to Stand: Min assist;From elevated surface Stand pivot transfers: Min guard       General transfer comment: Min assist to stand from elevated bed height and use of RW. Took steps to recliner with min guard. Activity limited by pain.    Balance Overall balance assessment: Needs assistance Sitting-balance support: No upper extremity supported Sitting balance-Leahy Scale: Fair     Standing balance support: During functional activity;Bilateral upper extremity supported Standing balance-Leahy Scale: Poor                             ADL either performed or assessed with clinical judgement   ADL Overall ADL's : Needs assistance/impaired Eating/Feeding: Independent   Grooming: Set up;Sitting   Upper Body Bathing: Set up;Sitting   Lower Body Bathing: Moderate assistance;Sit to/from stand   Upper Body Dressing : Set up;Sitting   Lower Body Dressing: Moderate assistance;Sit to/from stand   Toilet Transfer: Minimal assistance;BSC;Stand-pivot;RW   Toileting- Clothing Manipulation and Hygiene: Moderate assistance;Sit to/from stand               Vision Baseline Vision/History: Wears glasses Wears Glasses: At all times       Perception  Praxis      Pertinent Vitals/Pain Pain Assessment: 0-10 Pain Score: 10-Worst pain ever Pain Location: L SI joint - referred pain down to back of knee Pain Descriptors / Indicators: Grimacing;Guarding;Sharp Pain Intervention(s): Limited activity within patient's tolerance;Premedicated before session;Monitored during session;Repositioned     Hand Dominance     Extremity/Trunk  Assessment Upper Extremity Assessment Upper Extremity Assessment: Overall WFL for tasks assessed   Lower Extremity Assessment Lower Extremity Assessment: Defer to PT evaluation   Cervical / Trunk Assessment Cervical / Trunk Assessment: Normal   Communication Communication Communication: No difficulties   Cognition Arousal/Alertness: Awake/alert Behavior During Therapy: WFL for tasks assessed/performed Overall Cognitive Status: Within Functional Limits for tasks assessed                                     General Comments       Exercises     Shoulder Instructions      Home Living Family/patient expects to be discharged to:: Private residence Living Arrangements: Parent Available Help at Discharge: Family;Available PRN/intermittently   Home Access: Stairs to enter Entrance Stairs-Number of Steps: 3 Entrance Stairs-Rails: None Home Layout: One level     Bathroom Shower/Tub: Tub/shower unit         Home Equipment: None   Additional Comments: daughter lives nearby but work      Prior Functioning/Environment Level of Independence: Independent        Comments: lives with step dad who is a dialysis pt, pt helped him PTA,  works as Psychologist, clinical at [employer], pt stated other family can assist her at home prn        OT Problem List: Decreased strength;Decreased activity tolerance;Decreased knowledge of use of DME or AE;Pain;Obesity;Impaired balance (sitting and/or standing);Decreased range of motion      OT Treatment/Interventions: Self-care/ADL training;DME and/or AE instruction;Patient/family education;Balance training;Therapeutic activities;Therapeutic exercise    OT Goals(Current goals can be found in the care plan section) Acute Rehab OT Goals Patient Stated Goal: 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 OT Goal Formulation: With patient Time For Goal Achievement: 01/05/21 Potential to Achieve Goals: Good  OT  Frequency: Min 2X/week   Barriers to D/C:            Co-evaluation              AM-PAC OT "6 Clicks" Daily Activity     Outcome Measure Help from another person eating meals?: None Help from another person taking care of personal grooming?: A Little Help from another person toileting, which includes using toliet, bedpan, or urinal?: A Lot Help from another person bathing (including washing, rinsing, drying)?: A Lot Help from another person to put on and taking off regular upper body clothing?: A Little Help from another person to put on and taking off regular lower body clothing?: A Lot 6 Click Score: 16   End of Session Equipment Utilized During Treatment: Rolling walker Nurse Communication: Mobility status;Patient requests pain meds  Activity Tolerance:   Patient left: in chair;with call bell/phone within reach;with chair alarm set  OT Visit Diagnosis: Other abnormalities of gait and mobility (R26.89);Pain Pain - Right/Left: Left Pain - part of body: Hip;Knee                Time: 1020-1045 OT Time Calculation (min): 25 min Charges:  OT General Charges $OT Visit: 1 Visit OT Evaluation $  OT Eval Low Complexity: 1 Low OT Treatments $Therapeutic Activity: 8-22 mins  Latrisa Hellums, OTR/L Acute Care Rehab Services  Office [phone] Pager: [phone]   Kelli Churn 12/22/2020, 1:27 PM

## 2020-12-22 NOTE — Progress Notes (Signed)
Subjective: Patient more comfortable after recent muscle relaxant   Antibiotics:  Anti-infectives (From admission, onward)   Start     Dose/Rate Route Frequency Ordered Stop   12/20/20 1900  vancomycin (VANCOREADY) IVPB 1500 mg/300 mL        1,500 mg 150 mL/hr over 120 Minutes Intravenous Every 12 hours 12/20/20 0613     12/20/20 0645  vancomycin (VANCOREADY) IVPB 2000 mg/400 mL        2,000 mg 200 mL/hr over 120 Minutes Intravenous  Once 12/20/20 0554 12/20/20 0834      Medications: Scheduled Meds: . lip balm      . enoxaparin (LOVENOX) injection  50 mg Subcutaneous Q24H  . gabapentin  300 mg Oral QHS  . mometasone-formoterol  2 puff Inhalation BID  . montelukast  10 mg Oral q morning  . pantoprazole  40 mg Oral Daily   Continuous Infusions: . vancomycin 1,500 mg (12/22/20 0633)   PRN Meds:.acetaminophen **OR** acetaminophen, ALPRAZolam, HYDROmorphone (DILAUDID) injection, ketorolac, ondansetron (ZOFRAN) IV, oxyCODONE    Objective: Weight change:   Intake/Output Summary (Last 24 hours) at 12/22/2020 1623 Last data filed at 12/22/2020 1000 Gross per 24 hour  Intake 1440 ml  Output 1000 ml  Net 440 ml   Blood pressure 123/86, pulse 93, temperature 97.7 F (36.5 C), temperature source Oral, resp. rate 16, height 5' 7"  (1.702 m), weight 106.2 kg, last menstrual period 02/18/2014, SpO2 99 %. Temp:  [97.7 F (36.5 C)-100 F (37.8 C)] 97.7 F (36.5 C) (05/04 1414) Pulse Rate:  [70-93] 93 (05/04 1414) Resp:  [16-20] 16 (05/04 1414) BP: (123-160)/(70-86) 123/86 (05/04 1414) SpO2:  [96 %-100 %] 99 % (05/04 1414)  Physical Exam: Physical Exam Constitutional:      General: She is not in acute distress.    Appearance: She is well-developed. She is not diaphoretic.  HENT:     Head: Normocephalic and atraumatic.     Right Ear: External ear normal.     Left Ear: External ear normal.     Mouth/Throat:     Pharynx: No oropharyngeal exudate.  Eyes:      General: No scleral icterus.    Conjunctiva/sclera: Conjunctivae normal.     Pupils: Pupils are equal, round, and reactive to light.  Cardiovascular:     Rate and Rhythm: Normal rate and regular rhythm.     Heart sounds: No murmur heard. No friction rub.  Pulmonary:     Effort: Pulmonary effort is normal. No respiratory distress.     Breath sounds: No wheezing.  Abdominal:     General: Bowel sounds are normal. There is no distension.     Palpations: Abdomen is soft.     Tenderness: There is no abdominal tenderness. There is no rebound.  Lymphadenopathy:     Cervical: No cervical adenopathy.  Skin:    General: Skin is warm and dry.     Coloration: Skin is not pale.     Findings: No erythema or rash.  Neurological:     General: No focal deficit present.     Mental Status: She is alert and oriented to person, place, and time.     Motor: No abnormal muscle tone.  Psychiatric:        Attention and Perception: Attention normal.        Mood and Affect: Mood is depressed.        Behavior: Behavior normal.        Thought  Content: Thought content normal.        Cognition and Memory: Cognition and memory normal.        Judgment: Judgment normal.      Patient having TTE  CBC:    BMET Recent Labs    12/21/20 0503  NA 134*  K 3.8  CL 97*  CO2 26  GLUCOSE 128*  BUN 12  CREATININE 0.51  CALCIUM 9.8     Liver Panel  No results for input(s): PROT, ALBUMIN, AST, ALT, ALKPHOS, BILITOT, BILIDIR, IBILI in the last 72 hours.     Sedimentation Rate Recent Labs    12/21/20 0503  ESRSEDRATE 114*   C-Reactive Protein Recent Labs    12/21/20 0503  CRP 30.2*    Micro Results: Recent Results (from the past 720 hour(s))  Resp Panel by RT-PCR (Flu A&B, Covid) Nasopharyngeal Swab     Status: None   Collection Time: 12/18/20  9:10 PM   Specimen: Nasopharyngeal Swab; Nasopharyngeal(NP) swabs in vial transport medium  Result Value Ref Range Status   SARS Coronavirus 2 by  RT PCR NEGATIVE NEGATIVE Final    Comment: (NOTE) SARS-CoV-2 target nucleic acids are NOT DETECTED.  The SARS-CoV-2 RNA is generally detectable in upper respiratory specimens during the acute phase of infection. The lowest concentration of SARS-CoV-2 viral copies this assay can detect is 138 copies/mL. A negative result does not preclude SARS-Cov-2 infection and should not be used as the sole basis for treatment or other patient management decisions. A negative result may occur with  improper specimen collection/handling, submission of specimen other than nasopharyngeal swab, presence of viral mutation(s) within the areas targeted by this assay, and inadequate number of viral copies(<138 copies/mL). A negative result must be combined with clinical observations, patient history, and epidemiological information. The expected result is Negative.  Fact Sheet for Patients:  EntrepreneurPulse.com.au  Fact Sheet for Healthcare Providers:  IncredibleEmployment.be  This test is no t yet approved or cleared by the [place] FDA and  has been authorized for detection and/or diagnosis of SARS-CoV-2 by FDA under an Emergency Use Authorization (EUA). This EUA will remain  in effect (meaning this test can be used) for the duration of the COVID-19 declaration under Section 564(b)(1) of the Act, 21 U.S.C.section 360bbb-3(b)(1), unless the authorization is terminated  or revoked sooner.       Influenza A by PCR NEGATIVE NEGATIVE Final   Influenza B by PCR NEGATIVE NEGATIVE Final    Comment: (NOTE) The Xpert Xpress SARS-CoV-2/FLU/RSV plus assay is intended as an aid in the diagnosis of influenza from Nasopharyngeal swab specimens and should not be used as a sole basis for treatment. Nasal washings and aspirates are unacceptable for Xpert Xpress SARS-CoV-2/FLU/RSV testing.  Fact Sheet for Patients: EntrepreneurPulse.com.au  Fact Sheet  for Healthcare Providers: IncredibleEmployment.be  This test is not yet approved or cleared by the [place] FDA and has been authorized for detection and/or diagnosis of SARS-CoV-2 by FDA under an Emergency Use Authorization (EUA). This EUA will remain in effect (meaning this test can be used) for the duration of the COVID-19 declaration under Section 564(b)(1) of the Act, 21 U.S.C. section 360bbb-3(b)(1), unless the authorization is terminated or revoked.  Performed at [facility], [address] [place], [place] [address]   Culture, blood (routine x 2)     Status: Abnormal   Collection Time: 12/18/20 10:00 PM   Specimen: BLOOD  Result Value Ref Range Status   Specimen Description  Final    BLOOD RIGHT HAND Performed at [facility], [address], [place], [place] [address]    Special Requests   Final    BOTTLES DRAWN AEROBIC AND ANAEROBIC Blood Culture adequate volume Performed at [facility], [address] [place], [place] [address]    Culture  Setup Time   Final    GRAM POSITIVE COCCI IN BOTH AEROBIC AND ANAEROBIC BOTTLES CRITICAL RESULT CALLED TO, READ BACK BY AND VERIFIED WITH: VALERIE POWELL,RN 12/20/2020 AT 0522 A.HUGHES    Culture (A)  Final    STAPHYLOCOCCUS AUREUS SUSCEPTIBILITIES PERFORMED ON PREVIOUS CULTURE WITHIN THE LAST 5 DAYS. Performed at [facility], [place] [address], [place], [place] [address]    Report Status 12/22/2020 FINAL  Final  Culture, blood (routine x 2)     Status: Abnormal   Collection Time: 12/18/20 10:00 PM   Specimen: BLOOD  Result Value Ref Range Status   Specimen Description   Final    BLOOD LEFT HAND Performed at [facility], [address] [place], [place] [address]    Special Requests   Final    BOTTLES DRAWN AEROBIC AND ANAEROBIC Blood Culture adequate volume Performed at [facility], [address] [place], [place] [address]    Culture  Setup Time   Final    GRAM POSITIVE COCCI IN BOTH AEROBIC AND ANAEROBIC BOTTLES CRITICAL RESULT CALLED TO, READ BACK BY AND VERIFIED WITH: VALERIE POWELL,RN 12/20/2020 AT [place] A.HUGHES Performed at [facility], [place] [address], [place], [place] [address]    Culture METHICILLIN RESISTANT STAPHYLOCOCCUS AUREUS (A)  Final   Report Status 12/22/2020 FINAL  Final   Organism ID, Bacteria METHICILLIN RESISTANT STAPHYLOCOCCUS AUREUS  Final      Susceptibility   Methicillin resistant staphylococcus aureus - MIC*    CIPROFLOXACIN >=8 RESISTANT Resistant     ERYTHROMYCIN >=8 RESISTANT Resistant     GENTAMICIN <=0.5 SENSITIVE Sensitive     OXACILLIN >=4 RESISTANT Resistant     TETRACYCLINE <=1 SENSITIVE Sensitive     VANCOMYCIN <=0.5 SENSITIVE Sensitive     TRIMETH/SULFA <=10 SENSITIVE Sensitive     CLINDAMYCIN <=0.25 SENSITIVE Sensitive     RIFAMPIN <=0.5 SENSITIVE Sensitive     Inducible Clindamycin NEGATIVE Sensitive     * METHICILLIN RESISTANT STAPHYLOCOCCUS AUREUS  Blood Culture ID Panel (Reflexed)     Status: Abnormal   Collection Time: 12/18/20 10:00 PM  Result Value Ref Range Status   Enterococcus faecalis NOT DETECTED NOT DETECTED Final   Enterococcus Faecium NOT DETECTED NOT DETECTED Final   Listeria monocytogenes NOT DETECTED NOT DETECTED Final   Staphylococcus species DETECTED (A) NOT DETECTED Final    Comment: CRITICAL RESULT CALLED TO, READ BACK BY AND VERIFIED WITH: VALERIE POWELL,RN 12/20/2020 AT 0522 A.HUGHES    Staphylococcus aureus (BCID) DETECTED (A) NOT DETECTED Final    Comment: Methicillin (oxacillin)-resistant Staphylococcus aureus (MRSA). MRSA is predictably resistant to beta-lactam antibiotics (except ceftaroline). Preferred therapy is vancomycin unless clinically contraindicated. Patient requires contact precautions if  hospitalized. CRITICAL RESULT CALLED TO, READ BACK BY AND VERIFIED WITH: VALERIE  POWELL,RN 12/20/2020 AT 0522 A.HUGHES    Staphylococcus epidermidis NOT DETECTED NOT DETECTED Final   Staphylococcus lugdunensis NOT DETECTED NOT DETECTED Final   Streptococcus species NOT DETECTED NOT DETECTED Final   Streptococcus agalactiae NOT DETECTED NOT DETECTED Final   Streptococcus pneumoniae NOT DETECTED NOT DETECTED Final   Streptococcus pyogenes NOT DETECTED NOT DETECTED Final  A.calcoaceticus-baumannii NOT DETECTED NOT DETECTED Final   Bacteroides fragilis NOT DETECTED NOT DETECTED Final   Enterobacterales NOT DETECTED NOT DETECTED Final   Enterobacter cloacae complex NOT DETECTED NOT DETECTED Final   Escherichia coli NOT DETECTED NOT DETECTED Final   Klebsiella aerogenes NOT DETECTED NOT DETECTED Final   Klebsiella oxytoca NOT DETECTED NOT DETECTED Final   Klebsiella pneumoniae NOT DETECTED NOT DETECTED Final   Proteus species NOT DETECTED NOT DETECTED Final   Salmonella species NOT DETECTED NOT DETECTED Final   Serratia marcescens NOT DETECTED NOT DETECTED Final   Haemophilus influenzae NOT DETECTED NOT DETECTED Final   Neisseria meningitidis NOT DETECTED NOT DETECTED Final   Pseudomonas aeruginosa NOT DETECTED NOT DETECTED Final   Stenotrophomonas maltophilia NOT DETECTED NOT DETECTED Final   Candida albicans NOT DETECTED NOT DETECTED Final   Candida auris NOT DETECTED NOT DETECTED Final   Candida glabrata NOT DETECTED NOT DETECTED Final   Candida krusei NOT DETECTED NOT DETECTED Final   Candida parapsilosis NOT DETECTED NOT DETECTED Final   Candida tropicalis NOT DETECTED NOT DETECTED Final   Cryptococcus neoformans/gattii NOT DETECTED NOT DETECTED Final   Meth resistant mecA/C and MREJ DETECTED (A) NOT DETECTED Final    Comment: CRITICAL RESULT CALLED TO, READ BACK BY AND VERIFIED WITH: VALERIE POWELL,RN 12/20/2020 AT 0522 A.HUGHES Performed at [facility], [place] [address] [place], [place] [address]   Aerobic/Anaerobic Culture (surgical/deep wound)      Status: None (Preliminary result)   Collection Time: 12/20/20 10:02 AM   Specimen: Abscess  Result Value Ref Range Status   Specimen Description   Final    ABSCESS LT SI JOINT ASPIRATION Performed at [facility] [address] [place], [place] [address]    Special Requests   Final    NONE Performed at [facility], [place] [address] [place], [place] [address]    Gram Stain   Final    NO WBC SEEN RARE GRAM POSITIVE COCCI Performed at [facility], [place] [address], [place], [place] [address]    Culture   Final    FEW METHICILLIN RESISTANT STAPHYLOCOCCUS AUREUS NO ANAEROBES ISOLATED; CULTURE IN PROGRESS FOR 5 DAYS    Report Status PENDING  Incomplete   Organism ID, Bacteria METHICILLIN RESISTANT STAPHYLOCOCCUS AUREUS  Final      Susceptibility   Methicillin resistant staphylococcus aureus - MIC*    CIPROFLOXACIN >=8 RESISTANT Resistant     ERYTHROMYCIN >=8 RESISTANT Resistant     GENTAMICIN <=0.5 SENSITIVE Sensitive     OXACILLIN >=4 RESISTANT Resistant     TETRACYCLINE <=1 SENSITIVE Sensitive     VANCOMYCIN <=0.5 SENSITIVE Sensitive     TRIMETH/SULFA <=10 SENSITIVE Sensitive     CLINDAMYCIN <=0.25 SENSITIVE Sensitive     RIFAMPIN <=0.5 SENSITIVE Sensitive     Inducible Clindamycin NEGATIVE Sensitive     * FEW METHICILLIN RESISTANT STAPHYLOCOCCUS AUREUS  Culture, blood (routine x 2)     Status: None (Preliminary result)   Collection Time: 12/21/20 12:09 PM   Specimen: BLOOD  Result Value Ref Range Status   Specimen Description   Final    BLOOD LEFT ANTECUBITAL Performed at [facility] [address], [place], [place] [address]    Special Requests   Final    BOTTLES DRAWN AEROBIC AND ANAEROBIC Blood Culture results may not be optimal due to an excessive volume of blood received in culture bottles   Culture   Final  NO GROWTH < 24 HOURS Performed at [facility] [address], [place], [place]  [address]    Report Status PENDING  Incomplete  Culture, blood (routine x 2)     Status: None (Preliminary result)   Collection Time: 12/21/20 12:20 PM   Specimen: BLOOD RIGHT HAND  Result Value Ref Range Status   Specimen Description   Final    BLOOD RIGHT HAND Performed at [facility] [address] [place], [place] [address]    Special Requests   Final    BOTTLES DRAWN AEROBIC AND ANAEROBIC Blood Culture adequate volume Performed at [facility] [address] [place], [place] [address]    Culture   Final    NO GROWTH < 24 HOURS Performed at [facility] [address], [place], [place] [address]    Report Status PENDING  Incomplete    Studies/Results: No results found.    Assessment/Plan:  INTERVAL HISTORY: TTE done   Principal Problem:   MRSA (methicillin resistant Staphylococcus aureus) carrier Active Problems:   SIRS (systemic inflammatory response syndrome) (HCC)   Hip pain   Septic joint (HCC)   Chronic SI joint pain   Fever   Acute bacterial endocarditis    ELIM PEALE is a 60 y.o. 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  Continue vancomycin Repeat blood cultures no growth at 24 hours---WOULD WAIT THE FULL FIVE DAYS PRIOR TO PICC PLACEMENT 2D echocardiogram pending and she will likely need TEE, called Trish with cardiology and patient will be set up for transesophageal echo next Monday  I spent greater than 35 minutes with the patient including greater than 50% of time in face to face counsel of the patient, review of records and in coordination of her care with Cardiology and the Primary team.    LOS: 3 days   Alcide Evener 12/22/2020, 4:23 PM

## 2020-12-23 LAB — CBC WITH DIFFERENTIAL/PLATELET
Abs Immature Granulocytes: 0.38 10*3/uL — ABNORMAL HIGH (ref 0.00–0.07)
Basophils Absolute: 0.1 10*3/uL (ref 0.0–0.1)
Basophils Relative: 1 %
Eosinophils Absolute: 0.1 10*3/uL (ref 0.0–0.5)
Eosinophils Relative: 1 %
HCT: 35.4 % — ABNORMAL LOW (ref 36.0–46.0)
Hemoglobin: 11.6 g/dL — ABNORMAL LOW (ref 12.0–15.0)
Immature Granulocytes: 2 %
Lymphocytes Relative: 10 %
Lymphs Abs: 1.7 10*3/uL (ref 0.7–4.0)
MCH: 30.1 pg (ref 26.0–34.0)
MCHC: 32.8 g/dL (ref 30.0–36.0)
MCV: 91.9 fL (ref 80.0–100.0)
Monocytes Absolute: 1.4 10*3/uL — ABNORMAL HIGH (ref 0.1–1.0)
Monocytes Relative: 9 %
Neutro Abs: 12.9 10*3/uL — ABNORMAL HIGH (ref 1.7–7.7)
Neutrophils Relative %: 77 %
Platelets: 261 10*3/uL (ref 150–400)
RBC: 3.85 MIL/uL — ABNORMAL LOW (ref 3.87–5.11)
RDW: 13 % (ref 11.5–15.5)
WBC: 16.6 10*3/uL — ABNORMAL HIGH (ref 4.0–10.5)
nRBC: 0 % (ref 0.0–0.2)

## 2020-12-23 LAB — BASIC METABOLIC PANEL
Anion gap: 10 (ref 5–15)
BUN: 13 mg/dL (ref 6–20)
CO2: 27 mmol/L (ref 22–32)
Calcium: 9.3 mg/dL (ref 8.9–10.3)
Chloride: 100 mmol/L (ref 98–111)
Creatinine, Ser: 0.41 mg/dL — ABNORMAL LOW (ref 0.44–1.00)
GFR, Estimated: >60 mL/min (ref >60)
Glucose, Bld: 119 mg/dL — ABNORMAL HIGH (ref 70–99)
Potassium: 3.2 mmol/L — ABNORMAL LOW (ref 3.5–5.1)
Sodium: 137 mmol/L (ref 135–145)

## 2020-12-23 LAB — VANCOMYCIN, PEAK: Vancomycin Pk: 9 ug/mL — ABNORMAL LOW (ref 30–40)

## 2020-12-23 MED ORDER — POTASSIUM CHLORIDE CRYS ER 20 MEQ PO TBCR
40.0000 meq | EXTENDED_RELEASE_TABLET | Freq: Once | ORAL | Status: AC
  Administered 2020-12-23: 40 meq via ORAL
  Filled 2020-12-23: qty 2

## 2020-12-23 NOTE — Progress Notes (Addendum)
Pharmacy Antibiotic Note  Nicole Hines is a 60 y.o. female admitted on 12/18/2020 with septic sacroiliac joint and MRSA bacteremia. Pharmacy has been consulted for Vancomycin dosing.  Today, 12/23/2020:  Last true fevers 5/2  WBC remains elevated  SCr stable WNL; at baseline  Repeat BCx remain clear  CRP/ESR elevated as expected   Plan:  Continue vancomycin 1531m IV q12h   Will draw levels and measure AUC with next dose  Planning for TEE on Monday 5/9   Height: _0  (170.2 cm) Weight: 106.2 kg (234 lb 2.1 oz) IBW/kg (Calculated) : 61.6  Temp (24hrs), Avg:98.3 F (36.8 C), Min:97.7 F (36.5 C), Max:98.8 F (37.1 C)  Recent Labs  Lab 12/18/20 1105 12/18/20 2154 12/19/20 0751 12/21/20 0503 12/23/20 0452  WBC 16.0*  --  15.5* 13.8* 16.6*  CREATININE 0.54  --  0.59 0.51 0.41*  LATICACIDVEN  --  0.6  --   --   --     Estimated Creatinine Clearance: 94.9 mL/min (A) (by C-G formula based on SCr of 0.41 mg/dL (L)).    No Known Allergies  Antimicrobials this admission: 5/2 Vancomycin >>   Dose adjustments this admission:  Microbiology results: 4/30 BCx: GPC (BCID + MRSA) 5/3 BCx: ngtd  Thank you for allowing pharmacy to be a part of this patient's care.  Gauge Winski, [place]  PharmD 12/23/2020 12:55 PM

## 2020-12-23 NOTE — Progress Notes (Signed)
Subjective:     Antibiotics:  Anti-infectives (From admission, onward)   Start     Dose/Rate Route Frequency Ordered Stop   12/20/20 1900  vancomycin (VANCOREADY) IVPB 1500 mg/300 mL        1,500 mg 150 mL/hr over 120 Minutes Intravenous Every 12 hours 12/20/20 0613     12/20/20 0645  vancomycin (VANCOREADY) IVPB 2000 mg/400 mL        2,000 mg 200 mL/hr over 120 Minutes Intravenous  Once 12/20/20 0554 12/20/20 0834      Medications: Scheduled Meds: . enoxaparin (LOVENOX) injection  50 mg Subcutaneous Q24H  . gabapentin  300 mg Oral QHS  . mometasone-formoterol  2 puff Inhalation BID  . montelukast  10 mg Oral q morning  . pantoprazole  40 mg Oral Daily   Continuous Infusions: . vancomycin 1,500 mg (12/23/20 0602)   PRN Meds:.acetaminophen **OR** acetaminophen, ALPRAZolam, HYDROmorphone (DILAUDID) injection, ketorolac, ondansetron (ZOFRAN) IV, oxyCODONE    Objective: Weight change:   Intake/Output Summary (Last 24 hours) at 12/23/2020 1411 Last data filed at 12/22/2020 1800 Gross per 24 hour  Intake 1020 ml  Output --  Net 1020 ml   Blood pressure (!) 141/83, pulse 83, temperature 98 F (36.7 C), resp. rate 16, height 5' 7"  (1.702 m), weight 106.2 kg, last menstrual period 02/18/2014, SpO2 98 %. Temp:  [97.7 F (36.5 C)-98.8 F (37.1 C)] 98 F (36.7 C) (05/05 1302) Pulse Rate:  [83-93] 83 (05/05 1302) Resp:  [16-20] 16 (05/05 1302) BP: (123-148)/(72-86) 141/83 (05/05 1302) SpO2:  [90 %-99 %] 98 % (05/05 1302)  Physical Exam: Physical Exam Constitutional:      General: She is not in acute distress.    Appearance: She is well-developed. She is not diaphoretic.  HENT:     Head: Normocephalic and atraumatic.     Right Ear: External ear normal.     Left Ear: External ear normal.     Mouth/Throat:     Pharynx: No oropharyngeal exudate.  Eyes:     General: No scleral icterus.    Extraocular Movements: Extraocular movements intact.      Conjunctiva/sclera: Conjunctivae normal.     Pupils: Pupils are equal, round, and reactive to light.  Cardiovascular:     Rate and Rhythm: Normal rate and regular rhythm.     Heart sounds: No murmur heard. No friction rub.  Pulmonary:     Effort: Pulmonary effort is normal. No respiratory distress.     Breath sounds: No wheezing.  Abdominal:     General: Bowel sounds are normal. There is no distension.     Palpations: Abdomen is soft.     Tenderness: There is no abdominal tenderness. There is no rebound.  Lymphadenopathy:     Cervical: No cervical adenopathy.  Skin:    General: Skin is warm and dry.     Coloration: Skin is not pale.     Findings: No erythema or rash.  Neurological:     General: No focal deficit present.     Mental Status: She is alert and oriented to person, place, and time.     Motor: No abnormal muscle tone.  Psychiatric:        Attention and Perception: Attention normal.        Mood and Affect: Mood is depressed.        Behavior: Behavior normal.        Thought Content: Thought content normal.  Cognition and Memory: Cognition and memory normal.        Judgment: Judgment normal.       CBC:    BMET Recent Labs    12/21/20 0503 12/23/20 0452  NA 134* 137  K 3.8 3.2*  CL 97* 100  CO2 26 27  GLUCOSE 128* 119*  BUN 12 13  CREATININE 0.51 0.41*  CALCIUM 9.8 9.3     Liver Panel  No results for input(s): PROT, ALBUMIN, AST, ALT, ALKPHOS, BILITOT, BILIDIR, IBILI in the last 72 hours.     Sedimentation Rate Recent Labs    12/21/20 0503  ESRSEDRATE 114*   C-Reactive Protein Recent Labs    12/21/20 0503  CRP 30.2*    Micro Results: Recent Results (from the past 720 hour(s))  Resp Panel by RT-PCR (Flu A&B, Covid) Nasopharyngeal Swab     Status: None   Collection Time: 12/18/20  9:10 PM   Specimen: Nasopharyngeal Swab; Nasopharyngeal(NP) swabs in vial transport medium  Result Value Ref Range Status   SARS Coronavirus 2 by RT  PCR NEGATIVE NEGATIVE Final    Comment: (NOTE) SARS-CoV-2 target nucleic acids are NOT DETECTED.  The SARS-CoV-2 RNA is generally detectable in upper respiratory specimens during the acute phase of infection. The lowest concentration of SARS-CoV-2 viral copies this assay can detect is 138 copies/mL. A negative result does not preclude SARS-Cov-2 infection and should not be used as the sole basis for treatment or other patient management decisions. A negative result may occur with  improper specimen collection/handling, submission of specimen other than nasopharyngeal swab, presence of viral mutation(s) within the areas targeted by this assay, and inadequate number of viral copies(<138 copies/mL). A negative result must be combined with clinical observations, patient history, and epidemiological information. The expected result is Negative.  Fact Sheet for Patients:  EntrepreneurPulse.com.au  Fact Sheet for Healthcare Providers:  IncredibleEmployment.be  This test is no t yet approved or cleared by the [place] FDA and  has been authorized for detection and/or diagnosis of SARS-CoV-2 by FDA under an Emergency Use Authorization (EUA). This EUA will remain  in effect (meaning this test can be used) for the duration of the COVID-19 declaration under Section 564(b)(1) of the Act, 21 U.S.C.section 360bbb-3(b)(1), unless the authorization is terminated  or revoked sooner.       Influenza A by PCR NEGATIVE NEGATIVE Final   Influenza B by PCR NEGATIVE NEGATIVE Final    Comment: (NOTE) The Xpert Xpress SARS-CoV-2/FLU/RSV plus assay is intended as an aid in the diagnosis of influenza from Nasopharyngeal swab specimens and should not be used as a sole basis for treatment. Nasal washings and aspirates are unacceptable for Xpert Xpress SARS-CoV-2/FLU/RSV testing.  Fact Sheet for Patients: EntrepreneurPulse.com.au  Fact Sheet for  Healthcare Providers: IncredibleEmployment.be  This test is not yet approved or cleared by the [place] FDA and has been authorized for detection and/or diagnosis of SARS-CoV-2 by FDA under an Emergency Use Authorization (EUA). This EUA will remain in effect (meaning this test can be used) for the duration of the COVID-19 declaration under Section 564(b)(1) of the Act, 21 U.S.C. section 360bbb-3(b)(1), unless the authorization is terminated or revoked.  Performed at [facility], [address] [place], [place] [address]   Culture, blood (routine x 2)     Status: Abnormal   Collection Time: 12/18/20 10:00 PM   Specimen: BLOOD  Result Value Ref Range Status   Specimen Description   Final  BLOOD RIGHT HAND Performed at [facility], [address], [place], [place] [address]    Special Requests   Final    BOTTLES DRAWN AEROBIC AND ANAEROBIC Blood Culture adequate volume Performed at [facility], [address], [place], [place] [address]    Culture  Setup Time   Final    GRAM POSITIVE COCCI IN BOTH AEROBIC AND ANAEROBIC BOTTLES CRITICAL RESULT CALLED TO, READ BACK BY AND VERIFIED WITH: VALERIE POWELL,RN 12/20/2020 AT 0522 A.HUGHES    Culture (A)  Final    STAPHYLOCOCCUS AUREUS SUSCEPTIBILITIES PERFORMED ON PREVIOUS CULTURE WITHIN THE LAST 5 DAYS. Performed at [facility], [place] [address] [place], [place] [address]    Report Status 12/22/2020 FINAL  Final  Culture, blood (routine x 2)     Status: Abnormal   Collection Time: 12/18/20 10:00 PM   Specimen: BLOOD  Result Value Ref Range Status   Specimen Description   Final    BLOOD LEFT HAND Performed at [facility], [address], [place], [place] [address]    Special Requests   Final    BOTTLES DRAWN AEROBIC AND ANAEROBIC Blood Culture adequate volume Performed at [facility], [address], [place], [place] [address]    Culture  Setup Time   Final    GRAM POSITIVE COCCI IN BOTH AEROBIC AND ANAEROBIC BOTTLES CRITICAL RESULT CALLED TO, READ BACK BY AND VERIFIED WITH: VALERIE POWELL,RN 12/20/2020 AT [place] A.HUGHES Performed at [facility], [place] [address], [place],  [address]    Culture METHICILLIN RESISTANT STAPHYLOCOCCUS AUREUS (A)  Final   Report Status 12/22/2020 FINAL  Final   Organism ID, Bacteria METHICILLIN RESISTANT STAPHYLOCOCCUS AUREUS  Final      Susceptibility   Methicillin resistant staphylococcus aureus - MIC*    CIPROFLOXACIN >=8 RESISTANT Resistant     ERYTHROMYCIN >=8 RESISTANT Resistant     GENTAMICIN <=0.5 SENSITIVE Sensitive     OXACILLIN >=4 RESISTANT Resistant     TETRACYCLINE <=1 SENSITIVE Sensitive     VANCOMYCIN <=0.5 SENSITIVE Sensitive     TRIMETH/SULFA <=10 SENSITIVE Sensitive     CLINDAMYCIN <=0.25 SENSITIVE Sensitive     RIFAMPIN <=0.5 SENSITIVE Sensitive     Inducible Clindamycin NEGATIVE Sensitive     * METHICILLIN RESISTANT STAPHYLOCOCCUS AUREUS  Blood Culture ID Panel (Reflexed)     Status: Abnormal   Collection Time: 12/18/20 10:00 PM  Result Value Ref Range Status   Enterococcus faecalis NOT DETECTED NOT DETECTED Final   Enterococcus Faecium NOT DETECTED NOT DETECTED Final   Listeria monocytogenes NOT DETECTED NOT DETECTED Final   Staphylococcus species DETECTED (A) NOT DETECTED Final    Comment: CRITICAL RESULT CALLED TO, READ BACK BY AND VERIFIED WITH: VALERIE POWELL,RN 12/20/2020 AT 0522 A.HUGHES    Staphylococcus aureus (BCID) DETECTED (A) NOT DETECTED Final    Comment: Methicillin (oxacillin)-resistant Staphylococcus aureus (MRSA). MRSA is predictably resistant to beta-lactam antibiotics (except ceftaroline). Preferred therapy is vancomycin unless clinically contraindicated. Patient requires contact precautions if  hospitalized. CRITICAL RESULT CALLED TO, READ BACK BY AND VERIFIED WITH: VALERIE  POWELL,RN 12/20/2020 AT 0522 A.HUGHES    Staphylococcus epidermidis NOT DETECTED NOT DETECTED Final   Staphylococcus lugdunensis NOT DETECTED NOT DETECTED Final   Streptococcus species NOT DETECTED NOT DETECTED Final   Streptococcus agalactiae NOT DETECTED NOT DETECTED Final   Streptococcus pneumoniae NOT DETECTED NOT DETECTED Final   Streptococcus pyogenes NOT DETECTED NOT DETECTED Final   A.calcoaceticus-baumannii NOT DETECTED  NOT DETECTED Final   Bacteroides fragilis NOT DETECTED NOT DETECTED Final   Enterobacterales NOT DETECTED NOT DETECTED Final   Enterobacter cloacae complex NOT DETECTED NOT DETECTED Final   Escherichia coli NOT DETECTED NOT DETECTED Final   Klebsiella aerogenes NOT DETECTED NOT DETECTED Final   Klebsiella oxytoca NOT DETECTED NOT DETECTED Final   Klebsiella pneumoniae NOT DETECTED NOT DETECTED Final   Proteus species NOT DETECTED NOT DETECTED Final   Salmonella species NOT DETECTED NOT DETECTED Final   Serratia marcescens NOT DETECTED NOT DETECTED Final   Haemophilus influenzae NOT DETECTED NOT DETECTED Final   Neisseria meningitidis NOT DETECTED NOT DETECTED Final   Pseudomonas aeruginosa NOT DETECTED NOT DETECTED Final   Stenotrophomonas maltophilia NOT DETECTED NOT DETECTED Final   Candida albicans NOT DETECTED NOT DETECTED Final   Candida auris NOT DETECTED NOT DETECTED Final   Candida glabrata NOT DETECTED NOT DETECTED Final   Candida krusei NOT DETECTED NOT DETECTED Final   Candida parapsilosis NOT DETECTED NOT DETECTED Final   Candida tropicalis NOT DETECTED NOT DETECTED Final   Cryptococcus neoformans/gattii NOT DETECTED NOT DETECTED Final   Meth resistant mecA/C and MREJ DETECTED (A) NOT DETECTED Final    Comment: CRITICAL RESULT CALLED TO, READ BACK BY AND VERIFIED WITH: VALERIE POWELL,RN 12/20/2020 AT 0522 A.HUGHES Performed at [facility], [place] [address] [place], [place] [address]   Aerobic/Anaerobic Culture (surgical/deep wound)      Status: None (Preliminary result)   Collection Time: 12/20/20 10:02 AM   Specimen: Abscess  Result Value Ref Range Status   Specimen Description   Final    ABSCESS LT SI JOINT ASPIRATION Performed at [facility] [address], [place], [place] [address]    Special Requests   Final    NONE Performed at [facility], [place] [address] [place], [place] [address]    Gram Stain   Final    NO WBC SEEN RARE GRAM POSITIVE COCCI Performed at [facility], [place] [address] [place], [place] [address]    Culture   Final    FEW METHICILLIN RESISTANT STAPHYLOCOCCUS AUREUS NO ANAEROBES ISOLATED; CULTURE IN PROGRESS FOR 5 DAYS    Report Status PENDING  Incomplete   Organism ID, Bacteria METHICILLIN RESISTANT STAPHYLOCOCCUS AUREUS  Final      Susceptibility   Methicillin resistant staphylococcus aureus - MIC*    CIPROFLOXACIN >=8 RESISTANT Resistant     ERYTHROMYCIN >=8 RESISTANT Resistant     GENTAMICIN <=0.5 SENSITIVE Sensitive     OXACILLIN >=4 RESISTANT Resistant     TETRACYCLINE <=1 SENSITIVE Sensitive     VANCOMYCIN <=0.5 SENSITIVE Sensitive     TRIMETH/SULFA <=10 SENSITIVE Sensitive     CLINDAMYCIN <=0.25 SENSITIVE Sensitive     RIFAMPIN <=0.5 SENSITIVE Sensitive     Inducible Clindamycin NEGATIVE Sensitive     * FEW METHICILLIN RESISTANT STAPHYLOCOCCUS AUREUS  Culture, blood (routine x 2)     Status: None (Preliminary result)   Collection Time: 12/21/20 12:09 PM   Specimen: BLOOD  Result Value Ref Range Status   Specimen Description   Final    BLOOD LEFT ANTECUBITAL Performed at [facility] [address] [place], [place] [address]    Special Requests   Final    BOTTLES DRAWN AEROBIC AND ANAEROBIC Blood Culture results may not be optimal due to an excessive volume of blood received in culture bottles   Culture   Final    NO  GROWTH 2 DAYS Performed at [facility], [place] [address] [place], [place]  [address]    Report Status PENDING  Incomplete  Culture, blood (routine x 2)     Status: None (Preliminary result)   Collection Time: 12/21/20 12:20 PM   Specimen: BLOOD RIGHT HAND  Result Value Ref Range Status   Specimen Description   Final    BLOOD RIGHT HAND Performed at [facility] [address], [place], [place] [address]    Special Requests   Final    BOTTLES DRAWN AEROBIC AND ANAEROBIC Blood Culture adequate volume Performed at [facility] [address] [place], [place] [address]    Culture   Final    NO GROWTH 2 DAYS Performed at [facility] [address] [place],  [address]    Report Status PENDING  Incomplete    Studies/Results: ECHOCARDIOGRAM COMPLETE  Result Date: 12/22/2020    ECHOCARDIOGRAM REPORT   Patient Name:   Nicole Hines [facility] Date of Exam: 12/22/2020 Medical Rec #:  [id number]        Height:       67.0 in Accession #:    [id number]       Weight:       234.1 lb Date of Birth:  28-Aug-1960         BSA:          2.162 m Patient Age:    10 years         BP:           160/81 mmHg Patient Gender: F                HR:           70 bpm. Exam Location:  Inpatient Procedure: 2D Echo, Cardiac Doppler and Color Doppler Indications:    Bacteremia  History:        Patient has no prior history of Echocardiogram examinations.  Sonographer:    Mikki Santee RDCS Referring Phys: [id number] AMRIT ADHIKARI IMPRESSIONS  1. Left ventricular ejection fraction, by estimation, is 60 to 65%. The left ventricle has normal function. The left ventricle has no regional wall motion abnormalities. There is mild left ventricular hypertrophy. Left ventricular diastolic parameters were normal.  2. Right ventricular systolic function is normal. The right ventricular size is normal.  3. Left atrial size was moderately dilated.  4. The pericardial effusion is anterior to the right ventricle.  5. The mitral valve is normal in structure. No evidence of  mitral valve regurgitation. No evidence of mitral stenosis.  6. Probably tri leaflet . The aortic valve was not well visualized. There is moderate calcification of the aortic valve. Aortic valve regurgitation is not visualized. Moderate aortic valve stenosis.  7. The inferior vena cava is normal in size with greater than 50% respiratory variability, suggesting right atrial pressure of 3 mmHg. FINDINGS  Left Ventricle: Left ventricular ejection fraction, by estimation, is 60 to 65%. The left ventricle has normal function. The left ventricle has no regional wall motion abnormalities. The left ventricular internal cavity size was normal in size. There is  mild left ventricular hypertrophy. Left ventricular diastolic parameters were normal. Right Ventricle: The right ventricular size is normal. No increase in right ventricular wall thickness. Right ventricular systolic function is normal. Left Atrium: Left atrial size was moderately dilated. Right Atrium: Right atrial size was normal in size. Pericardium: Trivial pericardial effusion is present. The pericardial effusion  is anterior to the right ventricle. Mitral Valve: The mitral valve is normal in structure. No evidence of mitral valve regurgitation. No evidence of mitral valve stenosis. Tricuspid Valve: The tricuspid valve is normal in structure. Tricuspid valve regurgitation is not demonstrated. No evidence of tricuspid stenosis. Aortic Valve: Probably tri leaflet. The aortic valve was not well visualized. There is moderate calcification of the aortic valve. Aortic valve regurgitation is not visualized. Moderate aortic stenosis is present. Aortic valve mean gradient measures 28.5  mmHg. Aortic valve peak gradient measures 51.6 mmHg. Aortic valve area, by VTI measures 1.05 cm. Pulmonic Valve: The pulmonic valve was normal in structure. Pulmonic valve regurgitation is not visualized. No evidence of pulmonic stenosis. Aorta: The aortic root is normal in size and  structure. Venous: The inferior vena cava is normal in size with greater than 50% respiratory variability, suggesting right atrial pressure of 3 mmHg. IAS/Shunts: No atrial level shunt detected by color flow Doppler.  LEFT VENTRICLE PLAX 2D LVIDd:         4.95 cm  Diastology LVIDs:         3.96 cm  LV e' medial:    5.34 cm/s LV PW:         1.20 cm  LV E/e' medial:  20.4 LV IVS:        1.25 cm  LV e' lateral:   8.50 cm/s LVOT diam:     2.00 cm  LV E/e' lateral: 12.8 LV SV:         71 LV SV Index:   33 LVOT Area:     3.14 cm  RIGHT VENTRICLE RV S prime:     11.60 cm/s TAPSE (M-mode): 1.7 cm LEFT ATRIUM              Index       RIGHT ATRIUM           Index LA diam:        4.40 cm  2.03 cm/m  RA Area:     13.40 cm LA Vol (A2C):   116.0 ml 53.65 ml/m RA Volume:   29.80 ml  13.78 ml/m LA Vol (A4C):   67.0 ml  30.99 ml/m LA Biplane Vol: 94.7 ml  43.80 ml/m  AORTIC VALVE AV Area (Vmax):    1.02 cm AV Area (Vmean):   1.04 cm AV Area (VTI):     1.05 cm AV Vmax:           359.00 cm/s AV Vmean:          248.500 cm/s AV VTI:            0.676 m AV Peak Grad:      51.6 mmHg AV Mean Grad:      28.5 mmHg LVOT Vmax:         117.00 cm/s LVOT Vmean:        82.500 cm/s LVOT VTI:          0.225 m LVOT/AV VTI ratio: 0.33  AORTA Ao Root diam: 2.90 cm MITRAL VALVE MV Area (PHT): 2.66 cm     SHUNTS MV Decel Time: 285 msec     Systemic VTI:  0.22 m MV E velocity: 109.00 cm/s  Systemic Diam: 2.00 cm MV A velocity: 88.30 cm/s MV E/A ratio:  1.23 Jenkins Rouge MD Electronically signed by Jenkins Rouge MD Signature Date/Time: 12/22/2020/4:39:08 PM    Final       Assessment/Plan:  INTERVAL HISTORY: 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  Continue vancomycin Repeat blood cultures no growth at 24 hours---WOULD WAIT THE FULL FIVE DAYS PRIOR TO PICC PLACEMENT  Transesophageal echo next Monday     LOS: 4 days   Alcide Evener 12/23/2020, 2:11 PM

## 2020-12-23 NOTE — Progress Notes (Signed)
PROGRESS NOTE    Sandralee Tarkington Mctier  QQI:[id number] DOB: [birth year] DOA: 12/18/2020 PCP: [name], MD   Chief Complain: Left hip pain  Brief Narrative: Patient is a 60 year old female with history of chronic knee pain, asthma who presented to the emergency department at [facility] with complaint of worsening left hip pain, inability to ambulate due to pain.  In the emergency department she was found to have fever, lab work showed leukocytosis.  MRI revealed left septic sacroiliac joint.  Orthopedics, IR , ID consulted.  Underwent  CT-guided aspiration of left on 12/20/20.  Blood culture showed MRSA,on vancomycin.Plan for TTE on Monday.  PT/OT recommended HH on discharge.  Assessment & Plan:   Principal Problem:   MRSA (methicillin resistant Staphylococcus aureus) carrier Active Problems:   SIRS (systemic inflammatory response syndrome) (HCC)   Hip pain   Septic joint (HCC)   Chronic SI joint pain   Fever   Acute bacterial endocarditis   Left septic sacroiliac joint: Presented with fever, left hip pain.  MRI of the lumbar spine showed left septic sacroiliac joint.  IR, orthopedics, ID consulted. Underwent  CT-guided aspiration of left hip on 12/20/20 Final report of  joint fluid culture showed MRSA.  Patient on vancomycin. Continue pain management  MRSA bacteremia: Presented with fever, leukocytosis.  Source is left hip. .  Currently on vancomycin.  Follow final culture and sensitivity.  Leukocytosis still persists.  Elevated CRP She has a pansystolic murmur.  TTE showed normal left ventricle ejection fraction, no valvular vegetations   Repeat blood culture sent on 12/21/2020, no growth till date.  She will eventually need PICC line  Chronic pain syndrome: Continue supportive care, pain medications  History of asthma: Currently saturating fine on room air.  No wheezing  Debility/deconditioning/left hip pain:  PT/OT recommended skilled nursing facility on discharge.  Morbid  obesity: BMI of 36.6           DVT prophylaxis:lovenox Code Status: Full Family Communication: Called and discussed with daughter on phone on 12/21/20 Status is: Inpatient  Remains inpatient appropriate because:Inpatient level of care appropriate due to severity of illness   Dispo: The patient is from: Home              Anticipated d/c is to: Home              Patient currently is not medically stable to d/c.   Difficult to place patient No      Consultants: ID, orthopedics, IR  Procedures:  Antimicrobials:  Anti-infectives (From admission, onward)   Start     Dose/Rate Route Frequency Ordered Stop   12/20/20 1900  vancomycin (VANCOREADY) IVPB 1500 mg/300 mL        1,500 mg 150 mL/hr over 120 Minutes Intravenous Every 12 hours 12/20/20 0613     12/20/20 0645  vancomycin (VANCOREADY) IVPB 2000 mg/400 mL        2,000 mg 200 mL/hr over 120 Minutes Intravenous  Once 12/20/20 0554 12/20/20 0834      Subjective:  Patient seen and examined the bedside this morning.  Medically stable.  No active issues.  Left hip pain is better.  TEE plan for Monday  Objective: Vitals:   12/22/20 1414 12/22/20 2044 12/22/20 2106 12/23/20 0545  BP: 123/86  (!) 148/72 (!) 146/84  Pulse: 93  88 90  Resp: 16  20 20   Temp: 97.7 F (36.5 C)  98.8 F (37.1 C) 98.3 F (36.8 C)  TempSrc:  Result Value Ref Range Status   Specimen Description   Final    ABSCESS LT SI JOINT ASPIRATION Performed at [facility] [address], [place], [place] [address]    Special Requests   Final    NONE Performed at [facility], [place] [address] [place], [place] [address]    Gram Stain   Final    NO WBC SEEN RARE GRAM POSITIVE COCCI Performed at [facility], [place] [address], [place], [place] [address]    Culture   Final    FEW METHICILLIN RESISTANT STAPHYLOCOCCUS AUREUS NO ANAEROBES ISOLATED; CULTURE IN PROGRESS FOR 5 DAYS    Report Status PENDING  Incomplete   Organism ID, Bacteria METHICILLIN RESISTANT STAPHYLOCOCCUS AUREUS  Final      Susceptibility   Methicillin resistant staphylococcus aureus - MIC*    CIPROFLOXACIN >=8 RESISTANT Resistant     ERYTHROMYCIN >=8 RESISTANT Resistant     GENTAMICIN <=0.5 SENSITIVE Sensitive     OXACILLIN >=4 RESISTANT Resistant     TETRACYCLINE <=1 SENSITIVE Sensitive     VANCOMYCIN <=0.5 SENSITIVE Sensitive     TRIMETH/SULFA <=10 SENSITIVE Sensitive     CLINDAMYCIN <=0.25 SENSITIVE Sensitive     RIFAMPIN <=0.5 SENSITIVE Sensitive     Inducible Clindamycin NEGATIVE Sensitive     * FEW METHICILLIN RESISTANT STAPHYLOCOCCUS AUREUS  Culture, blood (routine x 2)     Status: None (Preliminary result)   Collection Time: 12/21/20 12:09 PM   Specimen: BLOOD  Result Value Ref Range Status   Specimen Description   Final    BLOOD LEFT ANTECUBITAL Performed at [facility] [address], [place], [place] [address]    Special Requests   Final    BOTTLES  DRAWN AEROBIC AND ANAEROBIC Blood Culture results may not be optimal due to an excessive volume of blood received in culture bottles   Culture   Final    NO GROWTH < 24 HOURS Performed at [facility] [address] [place], [place] [address]    Report Status PENDING  Incomplete  Culture, blood (routine x 2)     Status: None (Preliminary result)   Collection Time: 12/21/20 12:20 PM   Specimen: BLOOD RIGHT HAND  Result Value Ref Range Status   Specimen Description   Final    BLOOD RIGHT HAND Performed at [facility] [address] [place], [place] [address]    Special Requests   Final    BOTTLES DRAWN AEROBIC AND ANAEROBIC Blood Culture adequate volume Performed at [facility] [address] [place], [place] [address]    Culture   Final    NO GROWTH < 24 HOURS Performed at [facility] [address] [place], [place] [address]    Report Status PENDING  Incomplete         Radiology Studies: ECHOCARDIOGRAM COMPLETE  Result Date: 12/22/2020    ECHOCARDIOGRAM REPORT   Patient Name:   AMANDY CHUBBUCK [facility] Date of Exam: 12/22/2020 Medical Rec #:  [id number]        Height:       67.0 in Accession #:    [id number]       Weight:       234.1 lb Date of Birth:  1960/11/13         BSA:          2.162 m Patient Age:    61 years  Result Value Ref Range Status   Specimen Description   Final    ABSCESS LT SI JOINT ASPIRATION Performed at [facility] [address], [place], [place] [address]    Special Requests   Final    NONE Performed at [facility], [place] [address] [place], [place] [address]    Gram Stain   Final    NO WBC SEEN RARE GRAM POSITIVE COCCI Performed at [facility], [place] [address], [place], [place] [address]    Culture   Final    FEW METHICILLIN RESISTANT STAPHYLOCOCCUS AUREUS NO ANAEROBES ISOLATED; CULTURE IN PROGRESS FOR 5 DAYS    Report Status PENDING  Incomplete   Organism ID, Bacteria METHICILLIN RESISTANT STAPHYLOCOCCUS AUREUS  Final      Susceptibility   Methicillin resistant staphylococcus aureus - MIC*    CIPROFLOXACIN >=8 RESISTANT Resistant     ERYTHROMYCIN >=8 RESISTANT Resistant     GENTAMICIN <=0.5 SENSITIVE Sensitive     OXACILLIN >=4 RESISTANT Resistant     TETRACYCLINE <=1 SENSITIVE Sensitive     VANCOMYCIN <=0.5 SENSITIVE Sensitive     TRIMETH/SULFA <=10 SENSITIVE Sensitive     CLINDAMYCIN <=0.25 SENSITIVE Sensitive     RIFAMPIN <=0.5 SENSITIVE Sensitive     Inducible Clindamycin NEGATIVE Sensitive     * FEW METHICILLIN RESISTANT STAPHYLOCOCCUS AUREUS  Culture, blood (routine x 2)     Status: None (Preliminary result)   Collection Time: 12/21/20 12:09 PM   Specimen: BLOOD  Result Value Ref Range Status   Specimen Description   Final    BLOOD LEFT ANTECUBITAL Performed at [facility] [address], [place], [place] [address]    Special Requests   Final    BOTTLES  DRAWN AEROBIC AND ANAEROBIC Blood Culture results may not be optimal due to an excessive volume of blood received in culture bottles   Culture   Final    NO GROWTH < 24 HOURS Performed at [facility] [address] [place], [place] [address]    Report Status PENDING  Incomplete  Culture, blood (routine x 2)     Status: None (Preliminary result)   Collection Time: 12/21/20 12:20 PM   Specimen: BLOOD RIGHT HAND  Result Value Ref Range Status   Specimen Description   Final    BLOOD RIGHT HAND Performed at [facility] [address] [place], [place] [address]    Special Requests   Final    BOTTLES DRAWN AEROBIC AND ANAEROBIC Blood Culture adequate volume Performed at [facility] [address] [place], [place] [address]    Culture   Final    NO GROWTH < 24 HOURS Performed at [facility] [address] [place], [place] [address]    Report Status PENDING  Incomplete         Radiology Studies: ECHOCARDIOGRAM COMPLETE  Result Date: 12/22/2020    ECHOCARDIOGRAM REPORT   Patient Name:   AMANDY CHUBBUCK [facility] Date of Exam: 12/22/2020 Medical Rec #:  [id number]        Height:       67.0 in Accession #:    [id number]       Weight:       234.1 lb Date of Birth:  1960/11/13         BSA:          2.162 m Patient Age:    61 years  Result Value Ref Range Status   Specimen Description   Final    ABSCESS LT SI JOINT ASPIRATION Performed at [facility] [address], [place], [place] [address]    Special Requests   Final    NONE Performed at [facility], [place] [address] [place], [place] [address]    Gram Stain   Final    NO WBC SEEN RARE GRAM POSITIVE COCCI Performed at [facility], [place] [address], [place], [place] [address]    Culture   Final    FEW METHICILLIN RESISTANT STAPHYLOCOCCUS AUREUS NO ANAEROBES ISOLATED; CULTURE IN PROGRESS FOR 5 DAYS    Report Status PENDING  Incomplete   Organism ID, Bacteria METHICILLIN RESISTANT STAPHYLOCOCCUS AUREUS  Final      Susceptibility   Methicillin resistant staphylococcus aureus - MIC*    CIPROFLOXACIN >=8 RESISTANT Resistant     ERYTHROMYCIN >=8 RESISTANT Resistant     GENTAMICIN <=0.5 SENSITIVE Sensitive     OXACILLIN >=4 RESISTANT Resistant     TETRACYCLINE <=1 SENSITIVE Sensitive     VANCOMYCIN <=0.5 SENSITIVE Sensitive     TRIMETH/SULFA <=10 SENSITIVE Sensitive     CLINDAMYCIN <=0.25 SENSITIVE Sensitive     RIFAMPIN <=0.5 SENSITIVE Sensitive     Inducible Clindamycin NEGATIVE Sensitive     * FEW METHICILLIN RESISTANT STAPHYLOCOCCUS AUREUS  Culture, blood (routine x 2)     Status: None (Preliminary result)   Collection Time: 12/21/20 12:09 PM   Specimen: BLOOD  Result Value Ref Range Status   Specimen Description   Final    BLOOD LEFT ANTECUBITAL Performed at [facility] [address], [place], [place] [address]    Special Requests   Final    BOTTLES  DRAWN AEROBIC AND ANAEROBIC Blood Culture results may not be optimal due to an excessive volume of blood received in culture bottles   Culture   Final    NO GROWTH < 24 HOURS Performed at [facility] [address] [place], [place] [address]    Report Status PENDING  Incomplete  Culture, blood (routine x 2)     Status: None (Preliminary result)   Collection Time: 12/21/20 12:20 PM   Specimen: BLOOD RIGHT HAND  Result Value Ref Range Status   Specimen Description   Final    BLOOD RIGHT HAND Performed at [facility] [address] [place], [place] [address]    Special Requests   Final    BOTTLES DRAWN AEROBIC AND ANAEROBIC Blood Culture adequate volume Performed at [facility] [address] [place], [place] [address]    Culture   Final    NO GROWTH < 24 HOURS Performed at [facility] [address] [place], [place] [address]    Report Status PENDING  Incomplete         Radiology Studies: ECHOCARDIOGRAM COMPLETE  Result Date: 12/22/2020    ECHOCARDIOGRAM REPORT   Patient Name:   AMANDY CHUBBUCK [facility] Date of Exam: 12/22/2020 Medical Rec #:  [id number]        Height:       67.0 in Accession #:    [id number]       Weight:       234.1 lb Date of Birth:  1960/11/13         BSA:          2.162 m Patient Age:    61 years  Result Value Ref Range Status   Specimen Description   Final    ABSCESS LT SI JOINT ASPIRATION Performed at [facility] [address], [place], [place] [address]    Special Requests   Final    NONE Performed at [facility], [place] [address] [place], [place] [address]    Gram Stain   Final    NO WBC SEEN RARE GRAM POSITIVE COCCI Performed at [facility], [place] [address], [place], [place] [address]    Culture   Final    FEW METHICILLIN RESISTANT STAPHYLOCOCCUS AUREUS NO ANAEROBES ISOLATED; CULTURE IN PROGRESS FOR 5 DAYS    Report Status PENDING  Incomplete   Organism ID, Bacteria METHICILLIN RESISTANT STAPHYLOCOCCUS AUREUS  Final      Susceptibility   Methicillin resistant staphylococcus aureus - MIC*    CIPROFLOXACIN >=8 RESISTANT Resistant     ERYTHROMYCIN >=8 RESISTANT Resistant     GENTAMICIN <=0.5 SENSITIVE Sensitive     OXACILLIN >=4 RESISTANT Resistant     TETRACYCLINE <=1 SENSITIVE Sensitive     VANCOMYCIN <=0.5 SENSITIVE Sensitive     TRIMETH/SULFA <=10 SENSITIVE Sensitive     CLINDAMYCIN <=0.25 SENSITIVE Sensitive     RIFAMPIN <=0.5 SENSITIVE Sensitive     Inducible Clindamycin NEGATIVE Sensitive     * FEW METHICILLIN RESISTANT STAPHYLOCOCCUS AUREUS  Culture, blood (routine x 2)     Status: None (Preliminary result)   Collection Time: 12/21/20 12:09 PM   Specimen: BLOOD  Result Value Ref Range Status   Specimen Description   Final    BLOOD LEFT ANTECUBITAL Performed at [facility] [address], [place], [place] [address]    Special Requests   Final    BOTTLES  DRAWN AEROBIC AND ANAEROBIC Blood Culture results may not be optimal due to an excessive volume of blood received in culture bottles   Culture   Final    NO GROWTH < 24 HOURS Performed at [facility] [address] [place], [place] [address]    Report Status PENDING  Incomplete  Culture, blood (routine x 2)     Status: None (Preliminary result)   Collection Time: 12/21/20 12:20 PM   Specimen: BLOOD RIGHT HAND  Result Value Ref Range Status   Specimen Description   Final    BLOOD RIGHT HAND Performed at [facility] [address] [place], [place] [address]    Special Requests   Final    BOTTLES DRAWN AEROBIC AND ANAEROBIC Blood Culture adequate volume Performed at [facility] [address] [place], [place] [address]    Culture   Final    NO GROWTH < 24 HOURS Performed at [facility] [address] [place], [place] [address]    Report Status PENDING  Incomplete         Radiology Studies: ECHOCARDIOGRAM COMPLETE  Result Date: 12/22/2020    ECHOCARDIOGRAM REPORT   Patient Name:   AMANDY CHUBBUCK [facility] Date of Exam: 12/22/2020 Medical Rec #:  [id number]        Height:       67.0 in Accession #:    [id number]       Weight:       234.1 lb Date of Birth:  1960/11/13         BSA:          2.162 m Patient Age:    61 years  Result Value Ref Range Status   Specimen Description   Final    ABSCESS LT SI JOINT ASPIRATION Performed at [facility] [address], [place], [place] [address]    Special Requests   Final    NONE Performed at [facility], [place] [address] [place], [place] [address]    Gram Stain   Final    NO WBC SEEN RARE GRAM POSITIVE COCCI Performed at [facility], [place] [address], [place], [place] [address]    Culture   Final    FEW METHICILLIN RESISTANT STAPHYLOCOCCUS AUREUS NO ANAEROBES ISOLATED; CULTURE IN PROGRESS FOR 5 DAYS    Report Status PENDING  Incomplete   Organism ID, Bacteria METHICILLIN RESISTANT STAPHYLOCOCCUS AUREUS  Final      Susceptibility   Methicillin resistant staphylococcus aureus - MIC*    CIPROFLOXACIN >=8 RESISTANT Resistant     ERYTHROMYCIN >=8 RESISTANT Resistant     GENTAMICIN <=0.5 SENSITIVE Sensitive     OXACILLIN >=4 RESISTANT Resistant     TETRACYCLINE <=1 SENSITIVE Sensitive     VANCOMYCIN <=0.5 SENSITIVE Sensitive     TRIMETH/SULFA <=10 SENSITIVE Sensitive     CLINDAMYCIN <=0.25 SENSITIVE Sensitive     RIFAMPIN <=0.5 SENSITIVE Sensitive     Inducible Clindamycin NEGATIVE Sensitive     * FEW METHICILLIN RESISTANT STAPHYLOCOCCUS AUREUS  Culture, blood (routine x 2)     Status: None (Preliminary result)   Collection Time: 12/21/20 12:09 PM   Specimen: BLOOD  Result Value Ref Range Status   Specimen Description   Final    BLOOD LEFT ANTECUBITAL Performed at [facility] [address], [place], [place] [address]    Special Requests   Final    BOTTLES  DRAWN AEROBIC AND ANAEROBIC Blood Culture results may not be optimal due to an excessive volume of blood received in culture bottles   Culture   Final    NO GROWTH < 24 HOURS Performed at [facility] [address] [place], [place] [address]    Report Status PENDING  Incomplete  Culture, blood (routine x 2)     Status: None (Preliminary result)   Collection Time: 12/21/20 12:20 PM   Specimen: BLOOD RIGHT HAND  Result Value Ref Range Status   Specimen Description   Final    BLOOD RIGHT HAND Performed at [facility] [address] [place], [place] [address]    Special Requests   Final    BOTTLES DRAWN AEROBIC AND ANAEROBIC Blood Culture adequate volume Performed at [facility] [address] [place], [place] [address]    Culture   Final    NO GROWTH < 24 HOURS Performed at [facility] [address] [place], [place] [address]    Report Status PENDING  Incomplete         Radiology Studies: ECHOCARDIOGRAM COMPLETE  Result Date: 12/22/2020    ECHOCARDIOGRAM REPORT   Patient Name:   AMANDY CHUBBUCK [facility] Date of Exam: 12/22/2020 Medical Rec #:  [id number]        Height:       67.0 in Accession #:    [id number]       Weight:       234.1 lb Date of Birth:  1960/11/13         BSA:          2.162 m Patient Age:    61 years  Result Value Ref Range Status   Specimen Description   Final    ABSCESS LT SI JOINT ASPIRATION Performed at [facility] [address], [place], [place] [address]    Special Requests   Final    NONE Performed at [facility], [place] [address] [place], [place] [address]    Gram Stain   Final    NO WBC SEEN RARE GRAM POSITIVE COCCI Performed at [facility], [place] [address], [place], [place] [address]    Culture   Final    FEW METHICILLIN RESISTANT STAPHYLOCOCCUS AUREUS NO ANAEROBES ISOLATED; CULTURE IN PROGRESS FOR 5 DAYS    Report Status PENDING  Incomplete   Organism ID, Bacteria METHICILLIN RESISTANT STAPHYLOCOCCUS AUREUS  Final      Susceptibility   Methicillin resistant staphylococcus aureus - MIC*    CIPROFLOXACIN >=8 RESISTANT Resistant     ERYTHROMYCIN >=8 RESISTANT Resistant     GENTAMICIN <=0.5 SENSITIVE Sensitive     OXACILLIN >=4 RESISTANT Resistant     TETRACYCLINE <=1 SENSITIVE Sensitive     VANCOMYCIN <=0.5 SENSITIVE Sensitive     TRIMETH/SULFA <=10 SENSITIVE Sensitive     CLINDAMYCIN <=0.25 SENSITIVE Sensitive     RIFAMPIN <=0.5 SENSITIVE Sensitive     Inducible Clindamycin NEGATIVE Sensitive     * FEW METHICILLIN RESISTANT STAPHYLOCOCCUS AUREUS  Culture, blood (routine x 2)     Status: None (Preliminary result)   Collection Time: 12/21/20 12:09 PM   Specimen: BLOOD  Result Value Ref Range Status   Specimen Description   Final    BLOOD LEFT ANTECUBITAL Performed at [facility] [address], [place], [place] [address]    Special Requests   Final    BOTTLES  DRAWN AEROBIC AND ANAEROBIC Blood Culture results may not be optimal due to an excessive volume of blood received in culture bottles   Culture   Final    NO GROWTH < 24 HOURS Performed at [facility] [address] [place], [place] [address]    Report Status PENDING  Incomplete  Culture, blood (routine x 2)     Status: None (Preliminary result)   Collection Time: 12/21/20 12:20 PM   Specimen: BLOOD RIGHT HAND  Result Value Ref Range Status   Specimen Description   Final    BLOOD RIGHT HAND Performed at [facility] [address] [place], [place] [address]    Special Requests   Final    BOTTLES DRAWN AEROBIC AND ANAEROBIC Blood Culture adequate volume Performed at [facility] [address] [place], [place] [address]    Culture   Final    NO GROWTH < 24 HOURS Performed at [facility] [address] [place], [place] [address]    Report Status PENDING  Incomplete         Radiology Studies: ECHOCARDIOGRAM COMPLETE  Result Date: 12/22/2020    ECHOCARDIOGRAM REPORT   Patient Name:   AMANDY CHUBBUCK [facility] Date of Exam: 12/22/2020 Medical Rec #:  [id number]        Height:       67.0 in Accession #:    [id number]       Weight:       234.1 lb Date of Birth:  1960/11/13         BSA:          2.162 m Patient Age:    61 years

## 2020-12-23 NOTE — Progress Notes (Signed)
Physical Therapy Treatment Patient Details Name: Nicole Hines MRN: [id number] DOB: [birth year] Today's Date: 12/23/2020    History of Present Illness 60 year old female who presented to the emergency department at [facility] with complaint of worsening left hip pain, inability to ambulate due to pain.  In the emergency department she was found to have fever, lab work showed leukocytosis.   MRI revealed left septic sacroiliac joint. Dx of MRSA, SIRS, septic L SIJ. Pt with history of chronic knee pain, asthma    PT Comments    Daughter [name] present during session for "Family Mobility education". Pt doing better and was pre medicated for pain.  Pt was self able to transition to EOB with increased time with 4/10 L posterior/buttock pain.  Pt was self able to rise with walker and amb to bathroom.  Had daughter "hands on" assist pt.  Pt present with decreased weight shift onto L LE but did well using walker for support.  Pt tolerated amb in hallway.  Returned to room and demonstrated how to "step over" a tub using "good" leg in and "bad" leg out.  Instructed on "walking is best exercise" in moderation and according to pain level.   Pt plans to D/C to home with full family support.   Follow Up Recommendations  Home health PT     Equipment Recommendations  Rolling walker with 5" wheels;3in1 (PT)    Recommendations for Other Services       Precautions / Restrictions Precautions Precautions: Fall Restrictions Weight Bearing Restrictions: No    Mobility  Bed Mobility Overal bed mobility: Needs Assistance Bed Mobility: Supine to Sit     Supine to sit: Supervision;HOB elevated     General bed mobility comments: supervision to transfer to side of bed - increased time and use of bed rails needed due to pain but self able.    Transfers Overall transfer level: Needs assistance Equipment used: Rolling walker (2 wheeled) Transfers: Sit to/from [facility] Sit  to Stand: Supervision Stand pivot transfers: Supervision       General transfer comment: increased time to rise with 25% VC's on proper hand placement and safety with turns using walker  Ambulation/Gait Ambulation/Gait assistance: Supervision Gait Distance (Feet): 75 Feet Assistive device: Rolling walker (2 wheeled) Gait Pattern/deviations: Step-to pattern;Decreased stance time - left Gait velocity: decreased   General Gait Details: pt tolerated a functional distance with walker.  avg HR was 108.  no dyspnea.  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)       Balance                                            Cognition Arousal/Alertness: Awake/alert Behavior During Therapy: WFL for tasks assessed/performed Overall Cognitive Status: Within Functional Limits for tasks assessed                                 General Comments: AxO x3 very motivated works at [employer] Comments        Pertinent Vitals/Pain Pain Assessment: 0-10 Pain Score: 5  Pain Location: L  SI joint - L buttock Pain Descriptors / Indicators: Grimacing;Guarding;Sharp;Tender Pain Intervention(s): Monitored during session;Premedicated before session;Repositioned    Home Living                      Prior Function            PT Goals (current goals can now be found in the care plan section) Progress towards PT goals: Progressing toward goals    Frequency    Min 3X/week      PT Plan Current plan remains appropriate    Co-evaluation              AM-PAC PT "6 Clicks" Mobility   Outcome Measure  Help needed turning from your back to your side while in a flat bed without using bedrails?: A Little Help needed moving from lying on your back to sitting on the side of a flat bed without using bedrails?: A  Little Help needed moving to and from a bed to a chair (including a wheelchair)?: A Little Help needed standing up from a chair using your arms (e.g., wheelchair or bedside chair)?: A Little Help needed to walk in hospital room?: A Little Help needed climbing 3-5 steps with a railing? : A Little 6 Click Score: 18    End of Session   Activity Tolerance: Patient tolerated treatment well Patient left: in chair;with call bell/phone within reach;with family/visitor present Nurse Communication: Mobility status PT Visit Diagnosis: Difficulty in walking, not elsewhere classified (R26.2);Pain Pain - Right/Left: Left Pain - part of body: Hip     Time: 1400-1445 PT Time Calculation (min) (ACUTE ONLY): 45 min  Charges:  $Gait Training: 23-37 mins $Therapeutic Activity: 8-22 mins                     Rica Koyanagi  PTA Acute  Rehabilitation Services Pager      [phone] Office      [phone]

## 2020-12-24 ENCOUNTER — Inpatient Hospital Stay: Payer: Self-pay

## 2020-12-24 DIAGNOSIS — I330 Acute and subacute infective endocarditis: Secondary | ICD-10-CM

## 2020-12-24 DIAGNOSIS — M00052 Staphylococcal arthritis, left hip: Principal | ICD-10-CM

## 2020-12-24 DIAGNOSIS — M533 Sacrococcygeal disorders, not elsewhere classified: Secondary | ICD-10-CM

## 2020-12-24 DIAGNOSIS — G8929 Other chronic pain: Secondary | ICD-10-CM

## 2020-12-24 LAB — CBC WITH DIFFERENTIAL/PLATELET
Abs Immature Granulocytes: 0.89 10*3/uL — ABNORMAL HIGH (ref 0.00–0.07)
Basophils Absolute: 0.1 10*3/uL (ref 0.0–0.1)
Basophils Relative: 1 %
Eosinophils Absolute: 0.1 10*3/uL (ref 0.0–0.5)
Eosinophils Relative: 1 %
HCT: 34.9 % — ABNORMAL LOW (ref 36.0–46.0)
Hemoglobin: 11.3 g/dL — ABNORMAL LOW (ref 12.0–15.0)
Immature Granulocytes: 5 %
Lymphocytes Relative: 12 %
Lymphs Abs: 2.2 10*3/uL (ref 0.7–4.0)
MCH: 30.3 pg (ref 26.0–34.0)
MCHC: 32.4 g/dL (ref 30.0–36.0)
MCV: 93.6 fL (ref 80.0–100.0)
Monocytes Absolute: 1.5 10*3/uL — ABNORMAL HIGH (ref 0.1–1.0)
Monocytes Relative: 8 %
Neutro Abs: 13.4 10*3/uL — ABNORMAL HIGH (ref 1.7–7.7)
Neutrophils Relative %: 73 %
Platelets: 309 10*3/uL (ref 150–400)
RBC: 3.73 MIL/uL — ABNORMAL LOW (ref 3.87–5.11)
RDW: 13.2 % (ref 11.5–15.5)
WBC: 18.2 10*3/uL — ABNORMAL HIGH (ref 4.0–10.5)
nRBC: 0 % (ref 0.0–0.2)

## 2020-12-24 LAB — BASIC METABOLIC PANEL
Anion gap: 12 (ref 5–15)
BUN: 11 mg/dL (ref 6–20)
CO2: 27 mmol/L (ref 22–32)
Calcium: 9.2 mg/dL (ref 8.9–10.3)
Chloride: 98 mmol/L (ref 98–111)
Creatinine, Ser: 0.56 mg/dL (ref 0.44–1.00)
GFR, Estimated: >60 mL/min (ref >60)
Glucose, Bld: 121 mg/dL — ABNORMAL HIGH (ref 70–99)
Potassium: 3.2 mmol/L — ABNORMAL LOW (ref 3.5–5.1)
Sodium: 137 mmol/L (ref 135–145)

## 2020-12-24 LAB — VANCOMYCIN, TROUGH: Vancomycin Tr: 6 ug/mL — ABNORMAL LOW (ref 15–20)

## 2020-12-24 MED ORDER — CHLORHEXIDINE GLUCONATE CLOTH 2 % EX PADS
6.0000 | MEDICATED_PAD | Freq: Every day | CUTANEOUS | Status: DC
  Administered 2020-12-24 – 2020-12-30 (×7): 6 via TOPICAL

## 2020-12-24 MED ORDER — METHOCARBAMOL 500 MG PO TABS: 1000.0000 mg | ORAL_TABLET | Freq: Four times a day (QID) | ORAL | Status: DC | PRN

## 2020-12-24 MED ORDER — KETOROLAC TROMETHAMINE 30 MG/ML IJ SOLN
30.0000 mg | Freq: Four times a day (QID) | INTRAMUSCULAR | Status: DC | PRN
  Administered 2020-12-24 (×2): 30 mg via INTRAMUSCULAR
  Filled 2020-12-24 (×2): qty 1

## 2020-12-24 MED ORDER — SODIUM CHLORIDE 0.9 % IV SOLN
10.0000 mg/kg | Freq: Every day | INTRAVENOUS | Status: DC
  Administered 2020-12-24 – 2020-12-29 (×6): 800 mg via INTRAVENOUS
  Filled 2020-12-24 (×9): qty 16

## 2020-12-24 MED ORDER — SODIUM CHLORIDE 0.9% FLUSH
10.0000 mL | INTRAVENOUS | Status: DC | PRN
  Administered 2020-12-29: 10 mL

## 2020-12-24 NOTE — Progress Notes (Signed)
PHARMACY CONSULT NOTE FOR:  OUTPATIENT  PARENTERAL ANTIBIOTIC THERAPY (OPAT)  Indication: MRSA bacteremia and sacroiliac joint infection Regimen: Daptomycin 800 mg IV daily End date: 02/14/2021  IV antibiotic discharge orders are pended. To discharging provider:  please sign these orders via discharge navigator,  Select New Orders & click on the button choice - Manage This Unsigned Work.     Thank you for allowing pharmacy to be a part of this patient's care.  Adilee Lemme A 12/24/2020, 7:52 PM

## 2020-12-24 NOTE — Progress Notes (Signed)
Peripherally Inserted Central Catheter Placement  The IV Nurse has discussed with the patient and/or persons authorized to consent for the patient, the purpose of this procedure and the potential benefits and risks involved with this procedure.  The benefits include less needle sticks, lab draws from the catheter, and the patient may be discharged home with the catheter. Risks include, but not limited to, infection, bleeding, blood clot (thrombus formation), and puncture of an artery; nerve damage and irregular heartbeat and possibility to perform a PICC exchange if needed/ordered by physician.  Alternatives to this procedure were also discussed.  Bard Power PICC patient education guide, fact sheet on infection prevention and patient information card has been provided to patient /or left at bedside.    PICC Placement Documentation  PICC Single Lumen 70/34/03 Right Basilic 39 cm 0 cm (Active)  Indication for Insertion or Continuance of Line Prolonged intravenous therapies 12/24/20 1345  Exposed Catheter (cm) 0 cm 12/24/20 1345  Site Assessment Clean;Dry;Intact 12/24/20 1345  Line Status Flushed;Blood return noted 12/24/20 1345  Dressing Type Transparent 12/24/20 1345  Dressing Status Clean;Dry;Intact 12/24/20 1345  Antimicrobial disc in place? Yes 12/24/20 1345  Dressing Change Due 12/31/20 12/24/20 1345       Scotty Court 12/24/2020, 1:55 PM

## 2020-12-24 NOTE — Progress Notes (Signed)
    [facility] has been requested to perform a transesophageal echocardiogram on Mr. Latimore for Bacteremia.  After careful review of history and examination, the risks and benefits of transesophageal echocardiogram have been explained including risks of esophageal damage, perforation (1:10,000 risk), bleeding, pharyngeal hematoma as well as other potential complications associated with conscious sedation including aspiration, arrhythmia, respiratory failure and death. Alternatives to treatment were discussed, questions were answered. Patient is willing to proceed.  TEE - Dr. [name]  12/27/2020 @ 1200.  NPO after midnight. Meds with sips.   Leanor Kail, PA-C 12/24/2020 1:54 PM

## 2020-12-24 NOTE — Progress Notes (Signed)
Occupational Therapy Treatment Patient Details Name: Nicole Hines MRN: [id number] DOB: 07/06/1961 Today's Date: 12/24/2020    History of present illness 60 year old female who presented to the emergency department at [facility] with complaint of worsening left hip pain, inability to ambulate due to pain.  In the emergency department she was found to have fever, lab work showed leukocytosis.   MRI revealed left septic sacroiliac joint. Dx of MRSA, SIRS, septic L SIJ. Pt with history of chronic knee pain, asthma   OT comments  Pt progressing towards acute OT goals. Pt able to complete BSC transfer and bathroom distance mobility utilizing rw and significant extra time/effort, no physical assist needed. Issued AE for LB ADLs and educated on technique. D/c plan remains appropriate.   Follow Up Recommendations  Home health OT    Equipment Recommendations  3 in 1 bedside commode (needs wide/bariatric)    Recommendations for Other Services      Precautions / Restrictions Precautions Precautions: Fall Restrictions Weight Bearing Restrictions: No       Mobility Bed Mobility Overal bed mobility: Needs Assistance Bed Mobility: Sit to Supine       Sit to supine: Min guard   General bed mobility comments: min guard for BLE onto bed    Transfers Overall transfer level: Needs assistance Equipment used: Rolling walker (2 wheeled) Transfers: Sit to/from [facility] Sit to Stand: Supervision Stand pivot transfers: Supervision       General transfer comment: extra time and effort. Painful transitional movements. utilized rw. to/from EOB.    Balance Overall balance assessment: Needs assistance Sitting-balance support: No upper extremity supported Sitting balance-Leahy Scale: Fair     Standing balance support: During functional activity;Bilateral upper extremity supported Standing balance-Leahy Scale: Poor                             ADL  either performed or assessed with clinical judgement   ADL Overall ADL's : Needs assistance/impaired                         Toilet Transfer: Min guard;Stand-pivot;Ambulation;BSC;RW Toilet Transfer Details (indicate cue type and reason): Pt preference to pivot to [facility]. supervision/min guard for pericare. Pt then walked bathroom distance, extra time and effort but no physical assist. Utilized rw.         Functional mobility during ADLs: Min guard;Rolling walker General ADL Comments: pivoted to [facility] then walked bathroom distance. Moaning in pain but able to push through. Pt reports not tolerating OOB activity as well as yesterday.     Vision       Perception     Praxis      Cognition Arousal/Alertness: Awake/alert Behavior During Therapy: WFL for tasks assessed/performed Overall Cognitive Status: Within Functional Limits for tasks assessed                                          Exercises     Shoulder Instructions       General Comments      Pertinent Vitals/ Pain       Pain Assessment: 0-10 Pain Score: 9  Pain Location: L SI joint - L buttock Pain Descriptors / Indicators: Grimacing;Guarding;Sharp;Tender;Moaning Pain Intervention(s): Limited activity within patient's tolerance;Monitored during session;Repositioned (pt requested pain med while RN in the  room)  Home Living                                          Prior Functioning/Environment              Frequency  Min 2X/week        Progress Toward Goals  OT Goals(current goals can now be found in the care plan section)  Progress towards OT goals: Progressing toward goals  Acute Rehab OT Goals Patient Stated Goal: 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 OT Goal Formulation: With patient Time For Goal Achievement: 01/05/21 Potential to Achieve Goals: Good  Plan Discharge plan remains appropriate    Co-evaluation                  AM-PAC OT "6 Clicks" Daily Activity     Outcome Measure   Help from another person eating meals?: None Help from another person taking care of personal grooming?: A Little Help from another person toileting, which includes using toliet, bedpan, or urinal?: A Little Help from another person bathing (including washing, rinsing, drying)?: A Lot Help from another person to put on and taking off regular upper body clothing?: A Little Help from another person to put on and taking off regular lower body clothing?: A Lot 6 Click Score: 17    End of Session Equipment Utilized During Treatment: Rolling walker  OT Visit Diagnosis: Other abnormalities of gait and mobility (R26.89);Pain Pain - Right/Left: Left Pain - part of body: Hip;Knee   Activity Tolerance Patient limited by pain   Patient Left in bed;with call bell/phone within reach;with nursing/sitter in room   Nurse Communication Mobility status;Patient requests pain meds        Time: 6734-1937 OT Time Calculation (min): 22 min  Charges: OT General Charges $OT Visit: 1 Visit OT Treatments $Self Care/Home Management : 8-22 mins  Tyrone Schimke, OT Acute Rehabilitation Services Pager: [phone] Office: [phone]    Hortencia Pilar 12/24/2020, 2:02 PM

## 2020-12-24 NOTE — Progress Notes (Addendum)
Subjective:   No new complaints  Antibiotics:  Anti-infectives (From admission, onward)   Start     Dose/Rate Route Frequency Ordered Stop   12/24/20 1000  DAPTOmycin (CUBICIN) 800 mg in sodium chloride 0.9 % IVPB        10 mg/kg  79.4 kg (Adjusted) 132 mL/hr over 30 Minutes Intravenous Daily 12/24/20 0805     12/20/20 1900  vancomycin (VANCOREADY) IVPB 1500 mg/300 mL  Status:  Discontinued        1,500 mg 150 mL/hr over 120 Minutes Intravenous Every 12 hours 12/20/20 0613 12/24/20 0758   12/20/20 0645  vancomycin (VANCOREADY) IVPB 2000 mg/400 mL        2,000 mg 200 mL/hr over 120 Minutes Intravenous  Once 12/20/20 0554 12/20/20 0834      Medications: Scheduled Meds: . Chlorhexidine Gluconate Cloth  6 each Topical Daily  . enoxaparin (LOVENOX) injection  50 mg Subcutaneous Q24H  . gabapentin  300 mg Oral QHS  . mometasone-formoterol  2 puff Inhalation BID  . montelukast  10 mg Oral q morning  . pantoprazole  40 mg Oral Daily   Continuous Infusions: . DAPTOmycin (CUBICIN)  IV 800 mg (12/24/20 1549)   PRN Meds:.acetaminophen **OR** acetaminophen, ALPRAZolam, HYDROmorphone (DILAUDID) injection, ketorolac, methocarbamol, ondansetron (ZOFRAN) IV, oxyCODONE, sodium chloride flush    Objective: Weight change:   Intake/Output Summary (Last 24 hours) at 12/24/2020 1643 Last data filed at 12/24/2020 1500 Gross per 24 hour  Intake 360 ml  Output 1000 ml  Net -640 ml   Blood pressure (!) 112/94, pulse 76, temperature 98.9 F (37.2 C), resp. rate (!) 22, height 5' 7" (1.702 m), weight 106.2 kg, last menstrual period 02/18/2014, SpO2 97 %. Temp:  [98.9 F (37.2 C)-100.9 F (38.3 C)] 98.9 F (37.2 C) (05/06 0627) Pulse Rate:  [76-94] 76 (05/06 0627) Resp:  [20-22] 22 (05/06 0627) BP: (112-146)/(75-94) 112/94 (05/06 0627) SpO2:  [91 %-99 %] 97 % (05/06 0755)  Physical Exam: Physical Exam Constitutional:      General: She is not in acute distress.    Appearance:  She is well-developed. She is not diaphoretic.  HENT:     Head: Normocephalic and atraumatic.     Right Ear: External ear normal.     Left Ear: External ear normal.     Mouth/Throat:     Pharynx: No oropharyngeal exudate.  Eyes:     General: No scleral icterus.    Extraocular Movements: Extraocular movements intact.     Conjunctiva/sclera: Conjunctivae normal.     Pupils: Pupils are equal, round, and reactive to light.  Cardiovascular:     Rate and Rhythm: Normal rate and regular rhythm.     Heart sounds: Murmur heard.  No friction rub.  Pulmonary:     Effort: Pulmonary effort is normal. No respiratory distress.     Breath sounds: No wheezing.  Abdominal:     General: Bowel sounds are normal. There is no distension.     Palpations: Abdomen is soft.     Tenderness: There is no abdominal tenderness. There is no rebound.  Lymphadenopathy:     Cervical: No cervical adenopathy.  Skin:    General: Skin is warm and dry.     Coloration: Skin is not pale.     Findings: No erythema or rash.  Neurological:     General: No focal deficit present.     Mental Status: She is alert and oriented to person,  place, and time.     Motor: No abnormal muscle tone.  Psychiatric:        Attention and Perception: Attention normal.        Mood and Affect: Mood is depressed.        Behavior: Behavior normal.        Thought Content: Thought content normal.        Cognition and Memory: Cognition and memory normal.        Judgment: Judgment normal.       CBC:    BMET Recent Labs    12/23/20 0452 12/24/20 0401  NA 137 137  K 3.2* 3.2*  CL 100 98  CO2 27 27  GLUCOSE 119* 121*  BUN 13 11  CREATININE 0.41* 0.56  CALCIUM 9.3 9.2     Liver Panel  No results for input(s): PROT, ALBUMIN, AST, ALT, ALKPHOS, BILITOT, BILIDIR, IBILI in the last 72 hours.     Sedimentation Rate No results for input(s): ESRSEDRATE in the last 72 hours. C-Reactive Protein No results for input(s): CRP in  the last 72 hours.  Micro Results: Recent Results (from the past 720 hour(s))  Resp Panel by RT-PCR (Flu A&B, Covid) Nasopharyngeal Swab     Status: None   Collection Time: 12/18/20  9:10 PM   Specimen: Nasopharyngeal Swab; Nasopharyngeal(NP) swabs in vial transport medium  Result Value Ref Range Status   SARS Coronavirus 2 by RT PCR NEGATIVE NEGATIVE Final    Comment: (NOTE) SARS-CoV-2 target nucleic acids are NOT DETECTED.  The SARS-CoV-2 RNA is generally detectable in upper respiratory specimens during the acute phase of infection. The lowest concentration of SARS-CoV-2 viral copies this assay can detect is 138 copies/mL. A negative result does not preclude SARS-Cov-2 infection and should not be used as the sole basis for treatment or other patient management decisions. A negative result may occur with  improper specimen collection/handling, submission of specimen other than nasopharyngeal swab, presence of viral mutation(s) within the areas targeted by this assay, and inadequate number of viral copies(<138 copies/mL). A negative result must be combined with clinical observations, patient history, and epidemiological information. The expected result is Negative.  Fact Sheet for Patients:  https://www.fda.gov/media/152166/download  Fact Sheet for Healthcare Providers:  https://www.fda.gov/media/152162/download  This test is no t yet approved or cleared by the [place] FDA and  has been authorized for detection and/or diagnosis of SARS-CoV-2 by FDA under an Emergency Use Authorization (EUA). This EUA will remain  in effect (meaning this test can be used) for the duration of the COVID-19 declaration under Section 564(b)(1) of the Act, 21 U.S.C.section 360bbb-3(b)(1), unless the authorization is terminated  or revoked sooner.       Influenza A by PCR NEGATIVE NEGATIVE Final   Influenza B by PCR NEGATIVE NEGATIVE Final    Comment: (NOTE) The Xpert Xpress  SARS-CoV-2/FLU/RSV plus assay is intended as an aid in the diagnosis of influenza from Nasopharyngeal swab specimens and should not be used as a sole basis for treatment. Nasal washings and aspirates are unacceptable for Xpert Xpress SARS-CoV-2/FLU/RSV testing.  Fact Sheet for Patients: https://www.fda.gov/media/152166/download  Fact Sheet for Healthcare Providers: https://www.fda.gov/media/152162/download  This test is not yet approved or cleared by the [place] FDA and has been authorized for detection and/or diagnosis of SARS-CoV-2 by FDA under an Emergency Use Authorization (EUA). This EUA will remain in effect (meaning this test can be used) for the duration of the COVID-19 declaration under Section 564(b)(1) of the Act,   21 U.S.C. section 360bbb-3(b)(1), unless the authorization is terminated or revoked.  Performed at [facility], [address], [place], [place] [address]   Culture, blood (routine x 2)     Status: Abnormal   Collection Time: 12/18/20 10:00 PM   Specimen: BLOOD  Result Value Ref Range Status   Specimen Description   Final    BLOOD RIGHT HAND Performed at [facility], [address], [place], [place] [address]    Special Requests   Final    BOTTLES DRAWN AEROBIC AND ANAEROBIC Blood Culture adequate volume Performed at [facility], [address], [place], [place] [address]    Culture  Setup Time   Final    GRAM POSITIVE COCCI IN BOTH AEROBIC AND ANAEROBIC BOTTLES CRITICAL RESULT CALLED TO, READ BACK BY AND VERIFIED WITH: VALERIE POWELL,RN 12/20/2020 AT 0522 A.HUGHES    Culture (A)  Final    STAPHYLOCOCCUS AUREUS SUSCEPTIBILITIES PERFORMED ON PREVIOUS CULTURE WITHIN THE LAST 5 DAYS. Performed at [facility], [address] [place], [place] [address]    Report Status 12/22/2020 FINAL  Final  Culture, blood (routine x 2)     Status: Abnormal   Collection Time: 12/18/20 10:00  PM   Specimen: BLOOD  Result Value Ref Range Status   Specimen Description   Final    BLOOD LEFT HAND Performed at [facility], [address], [place], [place] [address]    Special Requests   Final    BOTTLES DRAWN AEROBIC AND ANAEROBIC Blood Culture adequate volume Performed at [facility], [address], [place], [place] [address]    Culture  Setup Time   Final    GRAM POSITIVE COCCI IN BOTH AEROBIC AND ANAEROBIC BOTTLES CRITICAL RESULT CALLED TO, READ BACK BY AND VERIFIED WITH: VALERIE POWELL,RN 12/20/2020 AT 0521 A.HUGHES Performed at [facility], [address] , [place] [address]    Culture METHICILLIN RESISTANT STAPHYLOCOCCUS AUREUS (A)  Final   Report Status 12/22/2020 FINAL  Final   Organism ID, Bacteria METHICILLIN RESISTANT STAPHYLOCOCCUS AUREUS  Final      Susceptibility   Methicillin resistant staphylococcus aureus - MIC*    CIPROFLOXACIN >=8 RESISTANT Resistant     ERYTHROMYCIN >=8 RESISTANT Resistant     GENTAMICIN <=0.5 SENSITIVE Sensitive     OXACILLIN >=4 RESISTANT Resistant     TETRACYCLINE <=1 SENSITIVE Sensitive     VANCOMYCIN <=0.5 SENSITIVE Sensitive     TRIMETH/SULFA <=10 SENSITIVE Sensitive     CLINDAMYCIN <=0.25 SENSITIVE Sensitive     RIFAMPIN <=0.5 SENSITIVE Sensitive     Inducible Clindamycin NEGATIVE Sensitive     * METHICILLIN RESISTANT STAPHYLOCOCCUS AUREUS  Blood Culture ID Panel (Reflexed)     Status: Abnormal   Collection Time: 12/18/20 10:00 PM  Result Value Ref Range Status   Enterococcus faecalis NOT DETECTED NOT DETECTED Final   Enterococcus Faecium NOT DETECTED NOT DETECTED Final   Listeria monocytogenes NOT DETECTED NOT DETECTED Final   Staphylococcus species DETECTED (A) NOT DETECTED Final    Comment: CRITICAL RESULT CALLED TO, READ BACK BY AND VERIFIED WITH: VALERIE POWELL,RN 12/20/2020 AT 0522 A.HUGHES    Staphylococcus aureus (BCID) DETECTED (A) NOT DETECTED Final     Comment: Methicillin (oxacillin)-resistant Staphylococcus aureus (MRSA). MRSA is predictably resistant to beta-lactam antibiotics (except ceftaroline). Preferred therapy is vancomycin unless clinically contraindicated. Patient requires contact precautions if  hospitalized. CRITICAL RESULT CALLED TO, READ BACK BY AND VERIFIED WITH: VALERIE POWELL,RN   12/20/2020 AT 0522 A.HUGHES    Staphylococcus epidermidis NOT DETECTED NOT DETECTED Final   Staphylococcus lugdunensis NOT DETECTED NOT DETECTED Final   Streptococcus species NOT DETECTED NOT DETECTED Final   Streptococcus agalactiae NOT DETECTED NOT DETECTED Final   Streptococcus pneumoniae NOT DETECTED NOT DETECTED Final   Streptococcus pyogenes NOT DETECTED NOT DETECTED Final   A.calcoaceticus-baumannii NOT DETECTED NOT DETECTED Final   Bacteroides fragilis NOT DETECTED NOT DETECTED Final   Enterobacterales NOT DETECTED NOT DETECTED Final   Enterobacter cloacae complex NOT DETECTED NOT DETECTED Final   Escherichia coli NOT DETECTED NOT DETECTED Final   Klebsiella aerogenes NOT DETECTED NOT DETECTED Final   Klebsiella oxytoca NOT DETECTED NOT DETECTED Final   Klebsiella pneumoniae NOT DETECTED NOT DETECTED Final   Proteus species NOT DETECTED NOT DETECTED Final   Salmonella species NOT DETECTED NOT DETECTED Final   Serratia marcescens NOT DETECTED NOT DETECTED Final   Haemophilus influenzae NOT DETECTED NOT DETECTED Final   Neisseria meningitidis NOT DETECTED NOT DETECTED Final   Pseudomonas aeruginosa NOT DETECTED NOT DETECTED Final   Stenotrophomonas maltophilia NOT DETECTED NOT DETECTED Final   Candida albicans NOT DETECTED NOT DETECTED Final   Candida auris NOT DETECTED NOT DETECTED Final   Candida glabrata NOT DETECTED NOT DETECTED Final   Candida krusei NOT DETECTED NOT DETECTED Final   Candida parapsilosis NOT DETECTED NOT DETECTED Final   Candida tropicalis NOT DETECTED NOT DETECTED Final   Cryptococcus neoformans/gattii NOT  DETECTED NOT DETECTED Final   Meth resistant mecA/C and MREJ DETECTED (A) NOT DETECTED Final    Comment: CRITICAL RESULT CALLED TO, READ BACK BY AND VERIFIED WITH: VALERIE POWELL,RN 12/20/2020 AT 0522 A.HUGHES Performed at [facility], [place] [address] [place], [place] [address]   Aerobic/Anaerobic Culture (surgical/deep wound)     Status: None (Preliminary result)   Collection Time: 12/20/20 10:02 AM   Specimen: Abscess  Result Value Ref Range Status   Specimen Description   Final    ABSCESS LT SI JOINT ASPIRATION Performed at [facility] [address], [place], [place] [address]    Special Requests   Final    NONE Performed at [facility], [place] [address], [place], [place] [address]    Gram Stain   Final    NO WBC SEEN RARE GRAM POSITIVE COCCI Performed at [facility], [place] [address] [place], [place] [address]    Culture   Final    FEW METHICILLIN RESISTANT STAPHYLOCOCCUS AUREUS NO ANAEROBES ISOLATED; CULTURE IN PROGRESS FOR 5 DAYS    Report Status PENDING  Incomplete   Organism ID, Bacteria METHICILLIN RESISTANT STAPHYLOCOCCUS AUREUS  Final      Susceptibility   Methicillin resistant staphylococcus aureus - MIC*    CIPROFLOXACIN >=8 RESISTANT Resistant     ERYTHROMYCIN >=8 RESISTANT Resistant     GENTAMICIN <=0.5 SENSITIVE Sensitive     OXACILLIN >=4 RESISTANT Resistant     TETRACYCLINE <=1 SENSITIVE Sensitive     VANCOMYCIN <=0.5 SENSITIVE Sensitive     TRIMETH/SULFA <=10 SENSITIVE Sensitive     CLINDAMYCIN <=0.25 SENSITIVE Sensitive     RIFAMPIN <=0.5 SENSITIVE Sensitive     Inducible Clindamycin NEGATIVE Sensitive     * FEW METHICILLIN RESISTANT STAPHYLOCOCCUS AUREUS  Culture, blood (routine x 2)     Status: None (Preliminary result)   Collection Time: 12/21/20 12:09 PM   Specimen: BLOOD  Result Value Ref Range Status   Specimen Description  Final    BLOOD LEFT ANTECUBITAL Performed at [facility] [address], [place], [place] [address]    Special Requests   Final    BOTTLES DRAWN AEROBIC AND ANAEROBIC Blood Culture results may not be optimal due to an excessive volume of blood received in culture bottles   Culture   Final    NO GROWTH 3 DAYS Performed at [facility], [place] [address], [place], [place] [address]    Report Status PENDING  Incomplete  Culture, blood (routine x 2)     Status: None (Preliminary result)   Collection Time: 12/21/20 12:20 PM   Specimen: BLOOD RIGHT HAND  Result Value Ref Range Status   Specimen Description   Final    BLOOD RIGHT HAND Performed at [facility] [address] [place], [place] [address]    Special Requests   Final    BOTTLES DRAWN AEROBIC AND ANAEROBIC Blood Culture adequate volume Performed at [facility] [address], [place], [place] [address]    Culture   Final    NO GROWTH 3 DAYS Performed at [facility], [place] [address], [place], [place] [address]    Report Status PENDING  Incomplete    Studies/Results: [place] EKG SITE RITE  Result Date: 12/24/2020 If Site Rite image not attached, placement could not be confirmed due to current cardiac rhythm.     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  Switching to daptomycin since she is going to ultimately go home with home health will be a safer antibiotic for her  I wanted to wait for blood cultures to finalize negative prior to placement  of PICC line but she now has no IV access.  We ordered another blood culture prior to placement of PICC line which was placed today  Transesophageal echoMonday  Diagnosis: MRSA bacteremia and septic sacroiliac joint  Culture Result: MRSA  No Known Allergies  OPAT Orders Discharge antibiotics:  Daptomycin   Duration:  8 weeks  End Date:  June 27th, 2022  [facility] Care Per Protocol:   Labs  weekly while on IV antibiotics: _x_ CBC with differential _x_ BMP w GFR/CMP _x_ CRP _x_ ESR   _x_ CK  x__ Please pull PIC at completion of IV antibiotics __ Please leave PIC in place until doctor has seen patient or been notified  Fax weekly labs to [phone]  Clinic Follow Up Appt:   Yahaira Bruski Ambrosino has an appointment on 01/07/21 at 330 PM with Dr. Tommy Medal at the  [facility] for Infectious Disease is located in the [facility] at  [place] in [place].  [address], which is located to the left of the elevators.  Phone: [phone]  Fax: [phone]  [email or web address]  She should arrive 15-30 minutes prior to her appointment.  Dr. Baxter Flattery is available for questions this weekend and Dr. Saintclair Halsted will take over service on Monday.  LOS: 5 days    [place] 12/24/2020, 4:43 PM  

## 2020-12-24 NOTE — Progress Notes (Signed)
PROGRESS NOTE    Nicole Hines  KGM:[id number] DOB: Mar 08, 1961 DOA: 12/18/2020 PCP: [name], MD   Chief Complain: Left hip pain  Brief Narrative: Patient is a 60 year old female with history of chronic knee pain, asthma who presented to the emergency department at [facility] with complaint of worsening left hip pain, inability to ambulate due to pain.  In the emergency department she was found to have fever, lab work showed leukocytosis.  MRI revealed left septic sacroiliac joint.  Orthopedics, IR , ID consulted.  Underwent  CT-guided aspiration of left on 12/20/20.  Blood culture showed MRSA,on vancomycin.Plan for TTE on Monday.  PT/OT recommended HH on discharge.  Assessment & Plan:   Principal Problem:   MRSA (methicillin resistant Staphylococcus aureus) carrier Active Problems:   SIRS (systemic inflammatory response syndrome) (HCC)   Left hip pain   Septic joint (HCC)   Chronic SI joint pain   Fever   Acute bacterial endocarditis   Left septic sacroiliac joint: Presented with fever, left hip pain.  MRI of the lumbar spine showed left septic sacroiliac joint.  IR, orthopedics, ID consulted on admission. Underwent  CT-guided aspiration of left hip on 12/20/20 Final report of  joint fluid culture showed MRSA.  Patient on daptomycin Continue pain management  MRSA bacteremia: Presented with fever, leukocytosis.  Source is left hip.  Vancomycin,changed to Dapto.    Elevated CRP She has a pansystolic murmur.  TTE showed normal left ventricle ejection fraction, no valvular vegetations.  Plan for TEE on Monday Repeat blood culture sent on 12/21/2020, no growth till date.   She has persistent leukocytosis, had mild grade fever yesterday. PICC line ordered today because she does not have any IV access.  We discussed with infectious disease, Dr. Drucilla Schmidt, and he is okay to put a PICC line after we get 2 sets of blood cultures.  Chronic pain syndrome: Continue supportive care, pain  medications  History of asthma: Currently saturating fine on room air.  No wheezing  Debility/deconditioning/left hip pain:  PT/OT recommended HH on discharge.  Morbid obesity: BMI of 36.6           DVT prophylaxis:lovenox Code Status: Full Family Communication: Called and discussed with daughter on phone on 12/24/20 Status is: Inpatient  Remains inpatient appropriate because:Inpatient level of care appropriate due to severity of illness   Dispo: The patient is from: Home              Anticipated d/c is to: Home              Patient currently is not medically stable to d/c.   Difficult to place patient No      Consultants: ID, orthopedics, IR  Procedures:  Antimicrobials:  Anti-infectives (From admission, onward)   Start     Dose/Rate Route Frequency Ordered Stop   12/20/20 1900  vancomycin (VANCOREADY) IVPB 1500 mg/300 mL        1,500 mg 150 mL/hr over 120 Minutes Intravenous Every 12 hours 12/20/20 0613     12/20/20 0645  vancomycin (VANCOREADY) IVPB 2000 mg/400 mL        2,000 mg 200 mL/hr over 120 Minutes Intravenous  Once 12/20/20 0554 12/20/20 0834      Subjective:  Patient seen and examined the bedside this morning.  Hemodynamically stable.  Complains of severe pain on the left hip because she is not getting IV medications.  Objective: Vitals:   12/23/20 2054 12/23/20 2212 12/24/20 0133 12/24/20  PROGRESS NOTE    Nicole Hines  KGM:[id number] DOB: Mar 08, 1961 DOA: 12/18/2020 PCP: [name], MD   Chief Complain: Left hip pain  Brief Narrative: Patient is a 60 year old female with history of chronic knee pain, asthma who presented to the emergency department at [facility] with complaint of worsening left hip pain, inability to ambulate due to pain.  In the emergency department she was found to have fever, lab work showed leukocytosis.  MRI revealed left septic sacroiliac joint.  Orthopedics, IR , ID consulted.  Underwent  CT-guided aspiration of left on 12/20/20.  Blood culture showed MRSA,on vancomycin.Plan for TTE on Monday.  PT/OT recommended HH on discharge.  Assessment & Plan:   Principal Problem:   MRSA (methicillin resistant Staphylococcus aureus) carrier Active Problems:   SIRS (systemic inflammatory response syndrome) (HCC)   Left hip pain   Septic joint (HCC)   Chronic SI joint pain   Fever   Acute bacterial endocarditis   Left septic sacroiliac joint: Presented with fever, left hip pain.  MRI of the lumbar spine showed left septic sacroiliac joint.  IR, orthopedics, ID consulted on admission. Underwent  CT-guided aspiration of left hip on 12/20/20 Final report of  joint fluid culture showed MRSA.  Patient on daptomycin Continue pain management  MRSA bacteremia: Presented with fever, leukocytosis.  Source is left hip.  Vancomycin,changed to Dapto.    Elevated CRP She has a pansystolic murmur.  TTE showed normal left ventricle ejection fraction, no valvular vegetations.  Plan for TEE on Monday Repeat blood culture sent on 12/21/2020, no growth till date.   She has persistent leukocytosis, had mild grade fever yesterday. PICC line ordered today because she does not have any IV access.  We discussed with infectious disease, Dr. Drucilla Schmidt, and he is okay to put a PICC line after we get 2 sets of blood cultures.  Chronic pain syndrome: Continue supportive care, pain  medications  History of asthma: Currently saturating fine on room air.  No wheezing  Debility/deconditioning/left hip pain:  PT/OT recommended HH on discharge.  Morbid obesity: BMI of 36.6           DVT prophylaxis:lovenox Code Status: Full Family Communication: Called and discussed with daughter on phone on 12/24/20 Status is: Inpatient  Remains inpatient appropriate because:Inpatient level of care appropriate due to severity of illness   Dispo: The patient is from: Home              Anticipated d/c is to: Home              Patient currently is not medically stable to d/c.   Difficult to place patient No      Consultants: ID, orthopedics, IR  Procedures:  Antimicrobials:  Anti-infectives (From admission, onward)   Start     Dose/Rate Route Frequency Ordered Stop   12/20/20 1900  vancomycin (VANCOREADY) IVPB 1500 mg/300 mL        1,500 mg 150 mL/hr over 120 Minutes Intravenous Every 12 hours 12/20/20 0613     12/20/20 0645  vancomycin (VANCOREADY) IVPB 2000 mg/400 mL        2,000 mg 200 mL/hr over 120 Minutes Intravenous  Once 12/20/20 0554 12/20/20 0834      Subjective:  Patient seen and examined the bedside this morning.  Hemodynamically stable.  Complains of severe pain on the left hip because she is not getting IV medications.  Objective: Vitals:   12/23/20 2054 12/23/20 2212 12/24/20 0133 12/24/20  PROGRESS NOTE    Nicole Hines  KGM:[id number] DOB: Mar 08, 1961 DOA: 12/18/2020 PCP: [name], MD   Chief Complain: Left hip pain  Brief Narrative: Patient is a 60 year old female with history of chronic knee pain, asthma who presented to the emergency department at [facility] with complaint of worsening left hip pain, inability to ambulate due to pain.  In the emergency department she was found to have fever, lab work showed leukocytosis.  MRI revealed left septic sacroiliac joint.  Orthopedics, IR , ID consulted.  Underwent  CT-guided aspiration of left on 12/20/20.  Blood culture showed MRSA,on vancomycin.Plan for TTE on Monday.  PT/OT recommended HH on discharge.  Assessment & Plan:   Principal Problem:   MRSA (methicillin resistant Staphylococcus aureus) carrier Active Problems:   SIRS (systemic inflammatory response syndrome) (HCC)   Left hip pain   Septic joint (HCC)   Chronic SI joint pain   Fever   Acute bacterial endocarditis   Left septic sacroiliac joint: Presented with fever, left hip pain.  MRI of the lumbar spine showed left septic sacroiliac joint.  IR, orthopedics, ID consulted on admission. Underwent  CT-guided aspiration of left hip on 12/20/20 Final report of  joint fluid culture showed MRSA.  Patient on daptomycin Continue pain management  MRSA bacteremia: Presented with fever, leukocytosis.  Source is left hip.  Vancomycin,changed to Dapto.    Elevated CRP She has a pansystolic murmur.  TTE showed normal left ventricle ejection fraction, no valvular vegetations.  Plan for TEE on Monday Repeat blood culture sent on 12/21/2020, no growth till date.   She has persistent leukocytosis, had mild grade fever yesterday. PICC line ordered today because she does not have any IV access.  We discussed with infectious disease, Dr. Drucilla Schmidt, and he is okay to put a PICC line after we get 2 sets of blood cultures.  Chronic pain syndrome: Continue supportive care, pain  medications  History of asthma: Currently saturating fine on room air.  No wheezing  Debility/deconditioning/left hip pain:  PT/OT recommended HH on discharge.  Morbid obesity: BMI of 36.6           DVT prophylaxis:lovenox Code Status: Full Family Communication: Called and discussed with daughter on phone on 12/24/20 Status is: Inpatient  Remains inpatient appropriate because:Inpatient level of care appropriate due to severity of illness   Dispo: The patient is from: Home              Anticipated d/c is to: Home              Patient currently is not medically stable to d/c.   Difficult to place patient No      Consultants: ID, orthopedics, IR  Procedures:  Antimicrobials:  Anti-infectives (From admission, onward)   Start     Dose/Rate Route Frequency Ordered Stop   12/20/20 1900  vancomycin (VANCOREADY) IVPB 1500 mg/300 mL        1,500 mg 150 mL/hr over 120 Minutes Intravenous Every 12 hours 12/20/20 0613     12/20/20 0645  vancomycin (VANCOREADY) IVPB 2000 mg/400 mL        2,000 mg 200 mL/hr over 120 Minutes Intravenous  Once 12/20/20 0554 12/20/20 0834      Subjective:  Patient seen and examined the bedside this morning.  Hemodynamically stable.  Complains of severe pain on the left hip because she is not getting IV medications.  Objective: Vitals:   12/23/20 2054 12/23/20 2212 12/24/20 0133 12/24/20  [id number]       Weight:       234.1 lb Date of Birth:  [birth year]         BSA:          2.162 m Patient Age:    40 years         BP:           160/81 mmHg Patient Gender: F                HR:           70 bpm. Exam Location:  Inpatient Procedure: 2D Echo, Cardiac Doppler and Color Doppler Indications:    Bacteremia  History:        Patient has no prior history of Echocardiogram examinations.  Sonographer:    Mikki Santee RDCS Referring Phys: [id number] Foch Rosenwald  IMPRESSIONS  1. Left ventricular ejection fraction, by estimation, is 60 to 65%. The left ventricle has normal function. The left ventricle has no regional wall motion abnormalities. There is mild left ventricular hypertrophy. Left ventricular diastolic parameters were normal.  2. Right ventricular systolic function is normal. The right ventricular size is normal.  3. Left atrial size was moderately dilated.  4. The pericardial effusion is anterior to the right ventricle.  5. The mitral valve is normal in structure. No evidence of mitral valve regurgitation. No evidence of mitral stenosis.  6. Probably tri leaflet . The aortic valve was not well visualized. There is moderate calcification of the aortic valve. Aortic valve regurgitation is not visualized. Moderate aortic valve stenosis.  7. The inferior vena cava is normal in size with greater than 50% respiratory variability, suggesting right atrial pressure of 3 mmHg. FINDINGS  Left Ventricle: Left ventricular ejection fraction, by estimation, is 60 to 65%. The left ventricle has normal function. The left ventricle has no regional wall motion abnormalities. The left ventricular internal cavity size was normal in size. There is  mild left ventricular hypertrophy. Left ventricular diastolic parameters were normal. Right Ventricle: The right ventricular size is normal. No increase in right ventricular wall thickness. Right ventricular systolic function is normal. Left Atrium: Left atrial size was moderately dilated. Right Atrium: Right atrial size was normal in size. Pericardium: Trivial pericardial effusion is present. The pericardial effusion is anterior to the right ventricle. Mitral Valve: The mitral valve is normal in structure. No evidence of mitral valve regurgitation. No evidence of mitral valve stenosis. Tricuspid Valve: The tricuspid valve is normal in structure. Tricuspid valve regurgitation is not demonstrated. No evidence of tricuspid stenosis. Aortic  Valve: Probably tri leaflet. The aortic valve was not well visualized. There is moderate calcification of the aortic valve. Aortic valve regurgitation is not visualized. Moderate aortic stenosis is present. Aortic valve mean gradient measures 28.5  mmHg. Aortic valve peak gradient measures 51.6 mmHg. Aortic valve area, by VTI measures 1.05 cm. Pulmonic Valve: The pulmonic valve was normal in structure. Pulmonic valve regurgitation is not visualized. No evidence of pulmonic stenosis. Aorta: The aortic root is normal in size and structure. Venous: The inferior vena cava is normal in size with greater than 50% respiratory variability, suggesting right atrial pressure of 3 mmHg. IAS/Shunts: No atrial level shunt detected by color flow Doppler.  LEFT VENTRICLE PLAX 2D LVIDd:         4.95 cm  Diastology LVIDs:         3.96 cm  LV e' medial:    5.34 cm/s LV PW:  [id number]       Weight:       234.1 lb Date of Birth:  [birth year]         BSA:          2.162 m Patient Age:    40 years         BP:           160/81 mmHg Patient Gender: F                HR:           70 bpm. Exam Location:  Inpatient Procedure: 2D Echo, Cardiac Doppler and Color Doppler Indications:    Bacteremia  History:        Patient has no prior history of Echocardiogram examinations.  Sonographer:    Mikki Santee RDCS Referring Phys: [id number] Foch Rosenwald  IMPRESSIONS  1. Left ventricular ejection fraction, by estimation, is 60 to 65%. The left ventricle has normal function. The left ventricle has no regional wall motion abnormalities. There is mild left ventricular hypertrophy. Left ventricular diastolic parameters were normal.  2. Right ventricular systolic function is normal. The right ventricular size is normal.  3. Left atrial size was moderately dilated.  4. The pericardial effusion is anterior to the right ventricle.  5. The mitral valve is normal in structure. No evidence of mitral valve regurgitation. No evidence of mitral stenosis.  6. Probably tri leaflet . The aortic valve was not well visualized. There is moderate calcification of the aortic valve. Aortic valve regurgitation is not visualized. Moderate aortic valve stenosis.  7. The inferior vena cava is normal in size with greater than 50% respiratory variability, suggesting right atrial pressure of 3 mmHg. FINDINGS  Left Ventricle: Left ventricular ejection fraction, by estimation, is 60 to 65%. The left ventricle has normal function. The left ventricle has no regional wall motion abnormalities. The left ventricular internal cavity size was normal in size. There is  mild left ventricular hypertrophy. Left ventricular diastolic parameters were normal. Right Ventricle: The right ventricular size is normal. No increase in right ventricular wall thickness. Right ventricular systolic function is normal. Left Atrium: Left atrial size was moderately dilated. Right Atrium: Right atrial size was normal in size. Pericardium: Trivial pericardial effusion is present. The pericardial effusion is anterior to the right ventricle. Mitral Valve: The mitral valve is normal in structure. No evidence of mitral valve regurgitation. No evidence of mitral valve stenosis. Tricuspid Valve: The tricuspid valve is normal in structure. Tricuspid valve regurgitation is not demonstrated. No evidence of tricuspid stenosis. Aortic  Valve: Probably tri leaflet. The aortic valve was not well visualized. There is moderate calcification of the aortic valve. Aortic valve regurgitation is not visualized. Moderate aortic stenosis is present. Aortic valve mean gradient measures 28.5  mmHg. Aortic valve peak gradient measures 51.6 mmHg. Aortic valve area, by VTI measures 1.05 cm. Pulmonic Valve: The pulmonic valve was normal in structure. Pulmonic valve regurgitation is not visualized. No evidence of pulmonic stenosis. Aorta: The aortic root is normal in size and structure. Venous: The inferior vena cava is normal in size with greater than 50% respiratory variability, suggesting right atrial pressure of 3 mmHg. IAS/Shunts: No atrial level shunt detected by color flow Doppler.  LEFT VENTRICLE PLAX 2D LVIDd:         4.95 cm  Diastology LVIDs:         3.96 cm  LV e' medial:    5.34 cm/s LV PW:  PROGRESS NOTE    Nicole Hines  KGM:[id number] DOB: Mar 08, 1961 DOA: 12/18/2020 PCP: [name], MD   Chief Complain: Left hip pain  Brief Narrative: Patient is a 60 year old female with history of chronic knee pain, asthma who presented to the emergency department at [facility] with complaint of worsening left hip pain, inability to ambulate due to pain.  In the emergency department she was found to have fever, lab work showed leukocytosis.  MRI revealed left septic sacroiliac joint.  Orthopedics, IR , ID consulted.  Underwent  CT-guided aspiration of left on 12/20/20.  Blood culture showed MRSA,on vancomycin.Plan for TTE on Monday.  PT/OT recommended HH on discharge.  Assessment & Plan:   Principal Problem:   MRSA (methicillin resistant Staphylococcus aureus) carrier Active Problems:   SIRS (systemic inflammatory response syndrome) (HCC)   Left hip pain   Septic joint (HCC)   Chronic SI joint pain   Fever   Acute bacterial endocarditis   Left septic sacroiliac joint: Presented with fever, left hip pain.  MRI of the lumbar spine showed left septic sacroiliac joint.  IR, orthopedics, ID consulted on admission. Underwent  CT-guided aspiration of left hip on 12/20/20 Final report of  joint fluid culture showed MRSA.  Patient on daptomycin Continue pain management  MRSA bacteremia: Presented with fever, leukocytosis.  Source is left hip.  Vancomycin,changed to Dapto.    Elevated CRP She has a pansystolic murmur.  TTE showed normal left ventricle ejection fraction, no valvular vegetations.  Plan for TEE on Monday Repeat blood culture sent on 12/21/2020, no growth till date.   She has persistent leukocytosis, had mild grade fever yesterday. PICC line ordered today because she does not have any IV access.  We discussed with infectious disease, Dr. Drucilla Schmidt, and he is okay to put a PICC line after we get 2 sets of blood cultures.  Chronic pain syndrome: Continue supportive care, pain  medications  History of asthma: Currently saturating fine on room air.  No wheezing  Debility/deconditioning/left hip pain:  PT/OT recommended HH on discharge.  Morbid obesity: BMI of 36.6           DVT prophylaxis:lovenox Code Status: Full Family Communication: Called and discussed with daughter on phone on 12/24/20 Status is: Inpatient  Remains inpatient appropriate because:Inpatient level of care appropriate due to severity of illness   Dispo: The patient is from: Home              Anticipated d/c is to: Home              Patient currently is not medically stable to d/c.   Difficult to place patient No      Consultants: ID, orthopedics, IR  Procedures:  Antimicrobials:  Anti-infectives (From admission, onward)   Start     Dose/Rate Route Frequency Ordered Stop   12/20/20 1900  vancomycin (VANCOREADY) IVPB 1500 mg/300 mL        1,500 mg 150 mL/hr over 120 Minutes Intravenous Every 12 hours 12/20/20 0613     12/20/20 0645  vancomycin (VANCOREADY) IVPB 2000 mg/400 mL        2,000 mg 200 mL/hr over 120 Minutes Intravenous  Once 12/20/20 0554 12/20/20 0834      Subjective:  Patient seen and examined the bedside this morning.  Hemodynamically stable.  Complains of severe pain on the left hip because she is not getting IV medications.  Objective: Vitals:   12/23/20 2054 12/23/20 2212 12/24/20 0133 12/24/20  PROGRESS NOTE    Nicole Hines  KGM:[id number] DOB: Mar 08, 1961 DOA: 12/18/2020 PCP: [name], MD   Chief Complain: Left hip pain  Brief Narrative: Patient is a 60 year old female with history of chronic knee pain, asthma who presented to the emergency department at [facility] with complaint of worsening left hip pain, inability to ambulate due to pain.  In the emergency department she was found to have fever, lab work showed leukocytosis.  MRI revealed left septic sacroiliac joint.  Orthopedics, IR , ID consulted.  Underwent  CT-guided aspiration of left on 12/20/20.  Blood culture showed MRSA,on vancomycin.Plan for TTE on Monday.  PT/OT recommended HH on discharge.  Assessment & Plan:   Principal Problem:   MRSA (methicillin resistant Staphylococcus aureus) carrier Active Problems:   SIRS (systemic inflammatory response syndrome) (HCC)   Left hip pain   Septic joint (HCC)   Chronic SI joint pain   Fever   Acute bacterial endocarditis   Left septic sacroiliac joint: Presented with fever, left hip pain.  MRI of the lumbar spine showed left septic sacroiliac joint.  IR, orthopedics, ID consulted on admission. Underwent  CT-guided aspiration of left hip on 12/20/20 Final report of  joint fluid culture showed MRSA.  Patient on daptomycin Continue pain management  MRSA bacteremia: Presented with fever, leukocytosis.  Source is left hip.  Vancomycin,changed to Dapto.    Elevated CRP She has a pansystolic murmur.  TTE showed normal left ventricle ejection fraction, no valvular vegetations.  Plan for TEE on Monday Repeat blood culture sent on 12/21/2020, no growth till date.   She has persistent leukocytosis, had mild grade fever yesterday. PICC line ordered today because she does not have any IV access.  We discussed with infectious disease, Dr. Drucilla Schmidt, and he is okay to put a PICC line after we get 2 sets of blood cultures.  Chronic pain syndrome: Continue supportive care, pain  medications  History of asthma: Currently saturating fine on room air.  No wheezing  Debility/deconditioning/left hip pain:  PT/OT recommended HH on discharge.  Morbid obesity: BMI of 36.6           DVT prophylaxis:lovenox Code Status: Full Family Communication: Called and discussed with daughter on phone on 12/24/20 Status is: Inpatient  Remains inpatient appropriate because:Inpatient level of care appropriate due to severity of illness   Dispo: The patient is from: Home              Anticipated d/c is to: Home              Patient currently is not medically stable to d/c.   Difficult to place patient No      Consultants: ID, orthopedics, IR  Procedures:  Antimicrobials:  Anti-infectives (From admission, onward)   Start     Dose/Rate Route Frequency Ordered Stop   12/20/20 1900  vancomycin (VANCOREADY) IVPB 1500 mg/300 mL        1,500 mg 150 mL/hr over 120 Minutes Intravenous Every 12 hours 12/20/20 0613     12/20/20 0645  vancomycin (VANCOREADY) IVPB 2000 mg/400 mL        2,000 mg 200 mL/hr over 120 Minutes Intravenous  Once 12/20/20 0554 12/20/20 0834      Subjective:  Patient seen and examined the bedside this morning.  Hemodynamically stable.  Complains of severe pain on the left hip because she is not getting IV medications.  Objective: Vitals:   12/23/20 2054 12/23/20 2212 12/24/20 0133 12/24/20

## 2020-12-25 LAB — BASIC METABOLIC PANEL
Anion gap: 11 (ref 5–15)
BUN: 13 mg/dL (ref 6–20)
CO2: 30 mmol/L (ref 22–32)
Calcium: 9.4 mg/dL (ref 8.9–10.3)
Chloride: 103 mmol/L (ref 98–111)
Creatinine, Ser: 0.63 mg/dL (ref 0.44–1.00)
GFR, Estimated: >60 mL/min (ref >60)
Glucose, Bld: 103 mg/dL — ABNORMAL HIGH (ref 70–99)
Potassium: 3.1 mmol/L — ABNORMAL LOW (ref 3.5–5.1)
Sodium: 144 mmol/L (ref 135–145)

## 2020-12-25 LAB — BLOOD CULTURE ID PANEL (REFLEXED) - BCID2

## 2020-12-25 LAB — CBC WITH DIFFERENTIAL/PLATELET
Abs Immature Granulocytes: 0.67 10*3/uL — ABNORMAL HIGH (ref 0.00–0.07)
Basophils Absolute: 0.1 10*3/uL (ref 0.0–0.1)
Basophils Relative: 0 %
Eosinophils Absolute: 0.2 10*3/uL (ref 0.0–0.5)
Eosinophils Relative: 1 %
HCT: 33.6 % — ABNORMAL LOW (ref 36.0–46.0)
Hemoglobin: 10.6 g/dL — ABNORMAL LOW (ref 12.0–15.0)
Immature Granulocytes: 4 %
Lymphocytes Relative: 14 %
Lymphs Abs: 2.3 10*3/uL (ref 0.7–4.0)
MCH: 30 pg (ref 26.0–34.0)
MCHC: 31.5 g/dL (ref 30.0–36.0)
MCV: 95.2 fL (ref 80.0–100.0)
Monocytes Absolute: 1.2 10*3/uL — ABNORMAL HIGH (ref 0.1–1.0)
Monocytes Relative: 7 %
Neutro Abs: 11.9 10*3/uL — ABNORMAL HIGH (ref 1.7–7.7)
Neutrophils Relative %: 74 %
Platelets: 363 10*3/uL (ref 150–400)
RBC: 3.53 MIL/uL — ABNORMAL LOW (ref 3.87–5.11)
RDW: 13.6 % (ref 11.5–15.5)
WBC: 16.3 10*3/uL — ABNORMAL HIGH (ref 4.0–10.5)
nRBC: 0 % (ref 0.0–0.2)

## 2020-12-25 LAB — CK: Total CK: 17 U/L — ABNORMAL LOW (ref 38–234)

## 2020-12-25 MED ORDER — LIDOCAINE 5 % EX PTCH
1.0000 | MEDICATED_PATCH | CUTANEOUS | Status: DC
  Administered 2020-12-26 – 2020-12-30 (×4): 1 via TRANSDERMAL
  Filled 2020-12-25 (×6): qty 1

## 2020-12-25 MED ORDER — METHOCARBAMOL 1000 MG/10ML IJ SOLN
1000.0000 mg | Freq: Three times a day (TID) | INTRAVENOUS | Status: DC | PRN
  Administered 2020-12-25 – 2020-12-27 (×3): 1000 mg via INTRAVENOUS
  Filled 2020-12-25 (×3): qty 1000

## 2020-12-25 MED ORDER — POLYETHYLENE GLYCOL 3350 17 G PO PACK
17.0000 g | PACK | Freq: Every day | ORAL | Status: DC
  Administered 2020-12-25 – 2020-12-30 (×5): 17 g via ORAL
  Filled 2020-12-25 (×6): qty 1

## 2020-12-25 MED ORDER — POTASSIUM CHLORIDE CRYS ER 20 MEQ PO TBCR
40.0000 meq | EXTENDED_RELEASE_TABLET | Freq: Two times a day (BID) | ORAL | Status: AC
  Administered 2020-12-25 (×2): 40 meq via ORAL
  Filled 2020-12-25 (×2): qty 2

## 2020-12-25 MED ORDER — SENNOSIDES-DOCUSATE SODIUM 8.6-50 MG PO TABS
1.0000 | ORAL_TABLET | Freq: Two times a day (BID) | ORAL | Status: DC
  Administered 2020-12-25 – 2020-12-30 (×10): 1 via ORAL
  Filled 2020-12-25 (×10): qty 1

## 2020-12-25 MED ORDER — KETOROLAC TROMETHAMINE 30 MG/ML IJ SOLN
30.0000 mg | Freq: Four times a day (QID) | INTRAMUSCULAR | Status: AC | PRN
  Administered 2020-12-25 – 2020-12-26 (×3): 30 mg via INTRAVENOUS
  Filled 2020-12-25 (×3): qty 1

## 2020-12-25 MED ORDER — METHOCARBAMOL 1000 MG/10ML IJ SOLN
1000.0000 mg | Freq: Three times a day (TID) | INTRAMUSCULAR | Status: DC | PRN
  Filled 2020-12-25: qty 10

## 2020-12-25 NOTE — Plan of Care (Signed)
  Problem: Activity: Goal: Risk for activity intolerance will decrease Outcome: Progressing   Problem: Coping: Goal: Level of anxiety will decrease Outcome: Progressing   Problem: Pain Managment: Goal: General experience of comfort will improve Outcome: Progressing   Problem: Safety: Goal: Ability to remain free from injury will improve Outcome: Progressing   

## 2020-12-25 NOTE — Progress Notes (Signed)
PROGRESS NOTE    Nicole Hines  BTD:[id number] DOB: [birth year] DOA: 12/18/2020 PCP: [name], MD   Chief Complain: Left hip pain  Brief Narrative: Patient is a 60 year old female with history of chronic knee pain, asthma who presented to the emergency department at [facility] with complaint of worsening left hip pain, inability to ambulate due to pain.  In the emergency department she was found to have fever, lab work showed leukocytosis.  MRI revealed left septic sacroiliac joint.  Orthopedics, IR , ID consulted.  Underwent  CT-guided aspiration of left on 12/20/20.  Blood culture showed MRSA,on vancomycin.Plan for TTE on Monday.  PT/OT recommended HH on discharge.  Assessment & Plan:   Principal Problem:   MRSA (methicillin resistant Staphylococcus aureus) carrier Active Problems:   SIRS (systemic inflammatory response syndrome) (HCC)   Left hip pain   Septic joint (HCC)   Chronic SI joint pain   Fever   Acute bacterial endocarditis   Left septic sacroiliac joint: Presented with fever, left hip pain.  MRI of the lumbar spine showed left septic sacroiliac joint.  IR, orthopedics, ID consulted on admission. Underwent  CT-guided aspiration of left hip on 12/20/20 Final report of  joint fluid culture showed MRSA.  Patient on daptomycin Continue pain management, continues to complain of left hip pain.  MRSA bacteremia: Presented with fever, leukocytosis.  Source is left hip.  Vancomycin,changed to Dapto.    Elevated CRP She has a pansystolic murmur.  TTE showed normal left ventricle ejection fraction, no valvular vegetations.  Plan for TEE on Monday Repeat blood culture sent on 12/21/2020, no growth till date.  Blood culture sent on 12/24/2020 is again showing gram-positive cocci.  We will follow up the report and might send new blood cultures. Leucocytosis improving  Hypokalemia: Being supplemented with potassium  Chronic pain syndrome: Continue supportive care, pain  medications.  Patient takes high-dose oxycodone at home on scheduled basis.  History of asthma: Currently saturating fine on room air.  No wheezing  Debility/deconditioning/left hip pain:  PT/OT recommended HH on discharge.  Morbid obesity: BMI of 36.6           DVT prophylaxis:lovenox Code Status: Full Family Communication: Called and discussed with daughter on phone on 12/25/20 Status is: Inpatient  Remains inpatient appropriate because:Inpatient level of care appropriate due to severity of illness   Dispo: The patient is from: Home              Anticipated d/c is to: Home              Patient currently is not medically stable to d/c.   Difficult to place patient No      Consultants: ID, orthopedics, IR  Procedures:  Antimicrobials:  Anti-infectives (From admission, onward)   Start     Dose/Rate Route Frequency Ordered Stop   12/24/20 1000  DAPTOmycin (CUBICIN) 800 mg in sodium chloride 0.9 % IVPB        10 mg/kg  79.4 kg (Adjusted) 132 mL/hr over 30 Minutes Intravenous Daily 12/24/20 0805     12/20/20 1900  vancomycin (VANCOREADY) IVPB 1500 mg/300 mL  Status:  Discontinued        1,500 mg 150 mL/hr over 120 Minutes Intravenous Every 12 hours 12/20/20 0613 12/24/20 0758   12/20/20 0645  vancomycin (VANCOREADY) IVPB 2000 mg/400 mL        2,000 mg 200 mL/hr over 120 Minutes Intravenous  Once 12/20/20 0554 12/20/20 0834  Subjective:  Patient seen and examined at the bedside this morning.  Hemodynamically stable.  Complains of left hip pain.Afebrile  Objective: Vitals:   12/24/20 0755 12/24/20 1957 12/24/20 2153 12/25/20 0404  BP:   138/74 (!) 152/75  Pulse:   88 74  Resp:   20 20  Temp:   98.5 F (36.9 C) 98.4 F (36.9 C)  TempSrc:      SpO2: 97% 97% 96% 97%  Weight:      Height:        Intake/Output Summary (Last 24 hours) at 12/25/2020 0755 Last data filed at 12/25/2020 0200 Gross per 24 hour  Intake 666 ml  Output --  Net 666 ml   Filed  Weights   12/18/20 1027 12/19/20 0236  Weight: 113.4 kg 106.2 kg    Examination:  General exam: Overall comfortable, not in distress, morbidly obese HEENT: PERRL Respiratory system:  no wheezes or crackles  Cardiovascular system: S1 & S2 heard, RRR.  Gastrointestinal system: Abdomen is nondistended, soft and nontender. Central nervous system: Alert and oriented Extremities: No edema, no clubbing ,no cyanosis, PICC line on the right arm Skin: No rashes, no ulcers,no icterus   Data Reviewed: I have personally reviewed following labs and imaging studies  CBC: Recent Labs  Lab 12/19/20 0751 12/21/20 0503 12/23/20 0452 12/24/20 0401 12/25/20 0346  WBC 15.5* 13.8* 16.6* 18.2* 16.3*  NEUTROABS 13.5* 10.8* 12.9* 13.4* 11.9*  HGB 12.1 13.3 11.6* 11.3* 10.6*  HCT 36.7 41.7 35.4* 34.9* 33.6*  MCV 93.9 94.8 91.9 93.6 95.2  PLT 191 179 261 309 580   Basic Metabolic Panel: Recent Labs  Lab 12/19/20 0751 12/21/20 0503 12/23/20 0452 12/24/20 0401 12/25/20 0346  NA 139 134* 137 137 144  K 4.0 3.8 3.2* 3.2* 3.1*  CL 104 97* 100 98 103  CO2 26 26 27 27 30   GLUCOSE 185* 128* 119* 121* 103*  BUN 17 12 13 11 13   CREATININE 0.59 0.51 0.41* 0.56 0.63  CALCIUM 9.7 9.8 9.3 9.2 9.4   GFR: Estimated Creatinine Clearance: 94.9 mL/min (by C-G formula based on SCr of 0.63 mg/dL). Liver Function Tests: Recent Labs  Lab 12/18/20 1105 12/19/20 0751  AST 15 25  ALT 16 25  ALKPHOS 63 69  BILITOT 0.9 0.8  PROT 7.6 8.0  ALBUMIN 4.2 3.4*   No results for input(s): LIPASE, AMYLASE in the last 168 hours. No results for input(s): AMMONIA in the last 168 hours. Coagulation Profile: No results for input(s): INR, PROTIME in the last 168 hours. Cardiac Enzymes: Recent Labs  Lab 12/25/20 0346  CKTOTAL 17*   BNP (last 3 results) No results for input(s): PROBNP in the last 8760 hours. HbA1C: No results for input(s): HGBA1C in the last 72 hours. CBG: No results for input(s): GLUCAP in  the last 168 hours. Lipid Profile: No results for input(s): CHOL, HDL, LDLCALC, TRIG, CHOLHDL, LDLDIRECT in the last 72 hours. Thyroid Function Tests: No results for input(s): TSH, T4TOTAL, FREET4, T3FREE, THYROIDAB in the last 72 hours. Anemia Panel: No results for input(s): VITAMINB12, FOLATE, FERRITIN, TIBC, IRON, RETICCTPCT in the last 72 hours. Sepsis Labs: Recent Labs  Lab 12/18/20 2154  LATICACIDVEN 0.6    Recent Results (from the past 240 hour(s))  Resp Panel by RT-PCR (Flu A&B, Covid) Nasopharyngeal Swab     Status: None   Collection Time: 12/18/20  9:10 PM   Specimen: Nasopharyngeal Swab; Nasopharyngeal(NP) swabs in vial transport medium  Result Value Ref Range Status  SARS Coronavirus 2 by RT PCR NEGATIVE NEGATIVE Final    Comment: (NOTE) SARS-CoV-2 target nucleic acids are NOT DETECTED.  The SARS-CoV-2 RNA is generally detectable in upper respiratory specimens during the acute phase of infection. The lowest concentration of SARS-CoV-2 viral copies this assay can detect is 138 copies/mL. A negative result does not preclude SARS-Cov-2 infection and should not be used as the sole basis for treatment or other patient management decisions. A negative result may occur with  improper specimen collection/handling, submission of specimen other than nasopharyngeal swab, presence of viral mutation(s) within the areas targeted by this assay, and inadequate number of viral copies(<138 copies/mL). A negative result must be combined with clinical observations, patient history, and epidemiological information. The expected result is Negative.  Fact Sheet for Patients:  EntrepreneurPulse.com.au  Fact Sheet for Healthcare Providers:  IncredibleEmployment.be  This test is no t yet approved or cleared by the [place] FDA and  has been authorized for detection and/or diagnosis of SARS-CoV-2 by FDA under an Emergency Use Authorization (EUA).  This EUA will remain  in effect (meaning this test can be used) for the duration of the COVID-19 declaration under Section 564(b)(1) of the Act, 21 U.S.C.section 360bbb-3(b)(1), unless the authorization is terminated  or revoked sooner.       Influenza A by PCR NEGATIVE NEGATIVE Final   Influenza B by PCR NEGATIVE NEGATIVE Final    Comment: (NOTE) The Xpert Xpress SARS-CoV-2/FLU/RSV plus assay is intended as an aid in the diagnosis of influenza from Nasopharyngeal swab specimens and should not be used as a sole basis for treatment. Nasal washings and aspirates are unacceptable for Xpert Xpress SARS-CoV-2/FLU/RSV testing.  Fact Sheet for Patients: EntrepreneurPulse.com.au  Fact Sheet for Healthcare Providers: IncredibleEmployment.be  This test is not yet approved or cleared by the [place] FDA and has been authorized for detection and/or diagnosis of SARS-CoV-2 by FDA under an Emergency Use Authorization (EUA). This EUA will remain in effect (meaning this test can be used) for the duration of the COVID-19 declaration under Section 564(b)(1) of the Act, 21 U.S.C. section 360bbb-3(b)(1), unless the authorization is terminated or revoked.  Performed at [facility], [address] [place], [place] [address]   Culture, blood (routine x 2)     Status: Abnormal   Collection Time: 12/18/20 10:00 PM   Specimen: BLOOD  Result Value Ref Range Status   Specimen Description   Final    BLOOD RIGHT HAND Performed at [facility], [address], [place], [place] [address]    Special Requests   Final    BOTTLES DRAWN AEROBIC AND ANAEROBIC Blood Culture adequate volume Performed at [facility], [address], [place], [place] [address]    Culture  Setup Time   Final    GRAM POSITIVE COCCI IN BOTH AEROBIC AND ANAEROBIC BOTTLES CRITICAL RESULT CALLED TO, READ BACK BY AND VERIFIED  WITH: VALERIE POWELL,RN 12/20/2020 AT 0522 A.HUGHES    Culture (A)  Final    STAPHYLOCOCCUS AUREUS SUSCEPTIBILITIES PERFORMED ON PREVIOUS CULTURE WITHIN THE LAST 5 DAYS. Performed at [facility], [place] [address], [place], [place] [address]    Report Status 12/22/2020 FINAL  Final  Culture, blood (routine x 2)     Status: Abnormal   Collection Time: 12/18/20 10:00 PM   Specimen: BLOOD  Result Value Ref Range Status   Specimen Description   Final    BLOOD LEFT HAND Performed at [facility], [phone]  [address], [place], [place] [address]    Special Requests   Final    BOTTLES DRAWN AEROBIC AND ANAEROBIC Blood Culture adequate volume Performed at [facility], [address] [place], [place] [address]    Culture  Setup Time   Final    GRAM POSITIVE COCCI IN BOTH AEROBIC AND ANAEROBIC BOTTLES CRITICAL RESULT CALLED TO, READ BACK BY AND VERIFIED WITH: VALERIE POWELL,RN 12/20/2020 AT [place] A.HUGHES Performed at [facility], [place] [address], [place], [place] [address]    Culture METHICILLIN RESISTANT STAPHYLOCOCCUS AUREUS (A)  Final   Report Status 12/22/2020 FINAL  Final   Organism ID, Bacteria METHICILLIN RESISTANT STAPHYLOCOCCUS AUREUS  Final      Susceptibility   Methicillin resistant staphylococcus aureus - MIC*    CIPROFLOXACIN >=8 RESISTANT Resistant     ERYTHROMYCIN >=8 RESISTANT Resistant     GENTAMICIN <=0.5 SENSITIVE Sensitive     OXACILLIN >=4 RESISTANT Resistant     TETRACYCLINE <=1 SENSITIVE Sensitive     VANCOMYCIN <=0.5 SENSITIVE Sensitive     TRIMETH/SULFA <=10 SENSITIVE Sensitive     CLINDAMYCIN <=0.25 SENSITIVE Sensitive     RIFAMPIN <=0.5 SENSITIVE Sensitive     Inducible Clindamycin NEGATIVE Sensitive     * METHICILLIN RESISTANT STAPHYLOCOCCUS AUREUS  Blood Culture ID Panel (Reflexed)     Status: Abnormal   Collection Time: 12/18/20 10:00 PM  Result Value Ref Range Status   Enterococcus faecalis NOT DETECTED  NOT DETECTED Final   Enterococcus Faecium NOT DETECTED NOT DETECTED Final   Listeria monocytogenes NOT DETECTED NOT DETECTED Final   Staphylococcus species DETECTED (A) NOT DETECTED Final    Comment: CRITICAL RESULT CALLED TO, READ BACK BY AND VERIFIED WITH: VALERIE POWELL,RN 12/20/2020 AT 0522 A.HUGHES    Staphylococcus aureus (BCID) DETECTED (A) NOT DETECTED Final    Comment: Methicillin (oxacillin)-resistant Staphylococcus aureus (MRSA). MRSA is predictably resistant to beta-lactam antibiotics (except ceftaroline). Preferred therapy is vancomycin unless clinically contraindicated. Patient requires contact precautions if  hospitalized. CRITICAL RESULT CALLED TO, READ BACK BY AND VERIFIED WITH: VALERIE POWELL,RN 12/20/2020 AT 0522 A.HUGHES    Staphylococcus epidermidis NOT DETECTED NOT DETECTED Final   Staphylococcus lugdunensis NOT DETECTED NOT DETECTED Final   Streptococcus species NOT DETECTED NOT DETECTED Final   Streptococcus agalactiae NOT DETECTED NOT DETECTED Final   Streptococcus pneumoniae NOT DETECTED NOT DETECTED Final   Streptococcus pyogenes NOT DETECTED NOT DETECTED Final   A.calcoaceticus-baumannii NOT DETECTED NOT DETECTED Final   Bacteroides fragilis NOT DETECTED NOT DETECTED Final   Enterobacterales NOT DETECTED NOT DETECTED Final   Enterobacter cloacae complex NOT DETECTED NOT DETECTED Final   Escherichia coli NOT DETECTED NOT DETECTED Final   Klebsiella aerogenes NOT DETECTED NOT DETECTED Final   Klebsiella oxytoca NOT DETECTED NOT DETECTED Final   Klebsiella pneumoniae NOT DETECTED NOT DETECTED Final   Proteus species NOT DETECTED NOT DETECTED Final   Salmonella species NOT DETECTED NOT DETECTED Final   Serratia marcescens NOT DETECTED NOT DETECTED Final   Haemophilus influenzae NOT DETECTED NOT DETECTED Final   Neisseria meningitidis NOT DETECTED NOT DETECTED Final   Pseudomonas aeruginosa NOT DETECTED NOT DETECTED Final   Stenotrophomonas maltophilia NOT  DETECTED NOT DETECTED Final   Candida albicans NOT DETECTED NOT DETECTED Final   Candida auris NOT DETECTED NOT DETECTED Final   Candida glabrata NOT DETECTED NOT DETECTED Final   Candida krusei NOT DETECTED NOT DETECTED Final   Candida parapsilosis NOT DETECTED NOT DETECTED Final   Candida tropicalis  NOT DETECTED NOT DETECTED Final   Cryptococcus neoformans/gattii NOT DETECTED NOT DETECTED Final   Meth resistant mecA/C and MREJ DETECTED (A) NOT DETECTED Final    Comment: CRITICAL RESULT CALLED TO, READ BACK BY AND VERIFIED WITH: VALERIE POWELL,RN 12/20/2020 AT 0522 A.HUGHES Performed at [facility], [place] [address] [place], [place] [address]   Aerobic/Anaerobic Culture (surgical/deep wound)     Status: None (Preliminary result)   Collection Time: 12/20/20 10:02 AM   Specimen: Abscess  Result Value Ref Range Status   Specimen Description   Final    ABSCESS LT SI JOINT ASPIRATION Performed at [facility] [address] [place], [place] [address]    Special Requests   Final    NONE Performed at [facility], [place] [address] [place], [place] [address]    Gram Stain   Final    NO WBC SEEN RARE GRAM POSITIVE COCCI Performed at [facility], [place] [address], [place], [place] [address]    Culture   Final    FEW METHICILLIN RESISTANT STAPHYLOCOCCUS AUREUS NO ANAEROBES ISOLATED; CULTURE IN PROGRESS FOR 5 DAYS    Report Status PENDING  Incomplete   Organism ID, Bacteria METHICILLIN RESISTANT STAPHYLOCOCCUS AUREUS  Final      Susceptibility   Methicillin resistant staphylococcus aureus - MIC*    CIPROFLOXACIN >=8 RESISTANT Resistant     ERYTHROMYCIN >=8 RESISTANT Resistant     GENTAMICIN <=0.5 SENSITIVE Sensitive     OXACILLIN >=4 RESISTANT Resistant     TETRACYCLINE <=1 SENSITIVE Sensitive     VANCOMYCIN <=0.5 SENSITIVE Sensitive     TRIMETH/SULFA <=10 SENSITIVE Sensitive     CLINDAMYCIN <=0.25 SENSITIVE Sensitive     RIFAMPIN  <=0.5 SENSITIVE Sensitive     Inducible Clindamycin NEGATIVE Sensitive     * FEW METHICILLIN RESISTANT STAPHYLOCOCCUS AUREUS  Culture, blood (routine x 2)     Status: None (Preliminary result)   Collection Time: 12/21/20 12:09 PM   Specimen: BLOOD  Result Value Ref Range Status   Specimen Description   Final    BLOOD LEFT ANTECUBITAL Performed at [facility] [address], [place], [place] [address]    Special Requests   Final    BOTTLES DRAWN AEROBIC AND ANAEROBIC Blood Culture results may not be optimal due to an excessive volume of blood received in culture bottles   Culture   Final    NO GROWTH 3 DAYS Performed at [facility], [place] [address], [place], [place] [address]    Report Status PENDING  Incomplete  Culture, blood (routine x 2)     Status: None (Preliminary result)   Collection Time: 12/21/20 12:20 PM   Specimen: BLOOD RIGHT HAND  Result Value Ref Range Status   Specimen Description   Final    BLOOD RIGHT HAND Performed at [facility] [address], [place], [place] [address]    Special Requests   Final    BOTTLES DRAWN AEROBIC AND ANAEROBIC Blood Culture adequate volume Performed at [facility] [address], [place], [place] [address]    Culture   Final    NO GROWTH 3 DAYS Performed at [facility], [place] [address] [place], [place] [address]    Report Status PENDING  Incomplete  Culture, blood (routine x 2)     Status: None (Preliminary result)   Collection Time: 12/24/20 11:53 AM   Specimen: BLOOD LEFT HAND  Result Value Ref Range Status   Specimen Description   Final    BLOOD LEFT HAND Performed at [facility] [address] [place], [place] [address]    Special Requests   Final    BOTTLES DRAWN AEROBIC ONLY Blood Culture adequate volume Performed at [facility] [address] [place], [place] [address]    Culture  Setup Time   Final     AEROBIC BOTTLE ONLY GRAM POSITIVE COCCI Organism ID to follow Performed at [facility], [place] [address], [place], [place] [address]    Culture PENDING  Incomplete   Report Status PENDING  Incomplete         Radiology Studies: [place] EKG SITE RITE  Result Date: 12/24/2020 If Site Rite image not attached, placement could not be confirmed due to current cardiac rhythm.       Scheduled Meds: . Chlorhexidine Gluconate Cloth  6 each Topical Daily  . enoxaparin (LOVENOX) injection  50 mg Subcutaneous Q24H  . gabapentin  300 mg Oral QHS  . mometasone-formoterol  2 puff Inhalation BID  . montelukast  10 mg Oral q morning  . pantoprazole  40 mg Oral Daily  . polyethylene glycol  17 g Oral Daily  . potassium chloride  40 mEq Oral BID  . senna-docusate  1 tablet Oral BID   Continuous Infusions: . DAPTOmycin (CUBICIN)  IV Stopped (12/24/20 1721)     LOS: 6 days    Time spent: 35 mins.More than 50% of that time was spent in counseling and/or coordination of care.      Shelly Coss, MD [facility] P5/02/2021, 7:55 AM

## 2020-12-25 NOTE — Progress Notes (Signed)
ID PROGRESS NOTE   60yo F with MRSA bacteremia and left hip pain concern for septic arthritis. Has been on vancomycin for MRSA bacteremia, had repeat cx on 5/6+ still positive. Coincidentally had line placed due to difficult access  Recommend: Change to daptomycin 57m/kg  Check baseline ck Will repeat blood cx monday and discuss plan for exchange/line holiday of picc line  Jossiah Smoak B. SEller[facility]ef[facility] Infectious Diseases [phone]

## 2020-12-25 NOTE — Progress Notes (Signed)
PHARMACY - PHYSICIAN COMMUNICATION CRITICAL VALUE ALERT - BLOOD CULTURE IDENTIFICATION (BCID)  Nicole Hines is an 60 y.o. female who presented to [facility] on 12/18/2020 with a chief complaint of  worsening L hip pain > septic picture per MRI  Assessment:  MRSA bacteremia/ hip joint infection  Name of physician (or Provider) Contacted: Dr. Tawanna Solo  Current antibiotics: Daptomycin 874m IV q24  Changes to prescribed antibiotics recommended:  Patient is on recommended antibiotics - No changes needed  Results for orders placed or performed during the hospital encounter of 12/18/20  Blood Culture ID Panel (Reflexed) (Collected: 12/24/2020 11:53 AM)  Result Value Ref Range   Enterococcus faecalis NOT DETECTED NOT DETECTED   Enterococcus Faecium NOT DETECTED NOT DETECTED   Listeria monocytogenes NOT DETECTED NOT DETECTED   Staphylococcus species DETECTED (A) NOT DETECTED   Staphylococcus aureus (BCID) DETECTED (A) NOT DETECTED   Staphylococcus epidermidis NOT DETECTED NOT DETECTED   Staphylococcus lugdunensis NOT DETECTED NOT DETECTED   Streptococcus species NOT DETECTED NOT DETECTED   Streptococcus agalactiae NOT DETECTED NOT DETECTED   Streptococcus pneumoniae NOT DETECTED NOT DETECTED   Streptococcus pyogenes NOT DETECTED NOT DETECTED   A.calcoaceticus-baumannii NOT DETECTED NOT DETECTED   Bacteroides fragilis NOT DETECTED NOT DETECTED   Enterobacterales NOT DETECTED NOT DETECTED   Enterobacter cloacae complex NOT DETECTED NOT DETECTED   Escherichia coli NOT DETECTED NOT DETECTED   Klebsiella aerogenes NOT DETECTED NOT DETECTED   Klebsiella oxytoca NOT DETECTED NOT DETECTED   Klebsiella pneumoniae NOT DETECTED NOT DETECTED   Proteus species NOT DETECTED NOT DETECTED   Salmonella species NOT DETECTED NOT DETECTED   Serratia marcescens NOT DETECTED NOT DETECTED   Haemophilus influenzae NOT DETECTED NOT DETECTED   Neisseria meningitidis NOT DETECTED NOT DETECTED   Pseudomonas  aeruginosa NOT DETECTED NOT DETECTED   Stenotrophomonas maltophilia NOT DETECTED NOT DETECTED   Candida albicans NOT DETECTED NOT DETECTED   Candida auris NOT DETECTED NOT DETECTED   Candida glabrata NOT DETECTED NOT DETECTED   Candida krusei NOT DETECTED NOT DETECTED   Candida parapsilosis NOT DETECTED NOT DETECTED   Candida tropicalis NOT DETECTED NOT DETECTED   Cryptococcus neoformans/gattii NOT DETECTED NOT DETECTED   Meth resistant mecA/C and MREJ DETECTED (A) NOT DETECTED    GMinda DittoPharmD 12/25/2020  8:48 AM

## 2020-12-26 LAB — BASIC METABOLIC PANEL
Anion gap: 8 (ref 5–15)
BUN: 10 mg/dL (ref 6–20)
CO2: 32 mmol/L (ref 22–32)
Calcium: 8.9 mg/dL (ref 8.9–10.3)
Chloride: 101 mmol/L (ref 98–111)
Creatinine, Ser: 0.57 mg/dL (ref 0.44–1.00)
GFR, Estimated: >60 mL/min (ref >60)
Glucose, Bld: 105 mg/dL — ABNORMAL HIGH (ref 70–99)
Potassium: 3.8 mmol/L (ref 3.5–5.1)
Sodium: 141 mmol/L (ref 135–145)

## 2020-12-26 LAB — CBC WITH DIFFERENTIAL/PLATELET
Abs Immature Granulocytes: 0.48 10*3/uL — ABNORMAL HIGH (ref 0.00–0.07)
Basophils Absolute: 0 10*3/uL (ref 0.0–0.1)
Basophils Relative: 0 %
Eosinophils Absolute: 0.2 10*3/uL (ref 0.0–0.5)
Eosinophils Relative: 1 %
HCT: 31.6 % — ABNORMAL LOW (ref 36.0–46.0)
Hemoglobin: 10 g/dL — ABNORMAL LOW (ref 12.0–15.0)
Immature Granulocytes: 3 %
Lymphocytes Relative: 14 %
Lymphs Abs: 2 10*3/uL (ref 0.7–4.0)
MCH: 29.9 pg (ref 26.0–34.0)
MCHC: 31.6 g/dL (ref 30.0–36.0)
MCV: 94.3 fL (ref 80.0–100.0)
Monocytes Absolute: 1.1 10*3/uL — ABNORMAL HIGH (ref 0.1–1.0)
Monocytes Relative: 7 %
Neutro Abs: 10.8 10*3/uL — ABNORMAL HIGH (ref 1.7–7.7)
Neutrophils Relative %: 75 %
Platelets: 373 10*3/uL (ref 150–400)
RBC: 3.35 MIL/uL — ABNORMAL LOW (ref 3.87–5.11)
RDW: 13.8 % (ref 11.5–15.5)
WBC: 14.6 10*3/uL — ABNORMAL HIGH (ref 4.0–10.5)
nRBC: 0 % (ref 0.0–0.2)

## 2020-12-26 LAB — AEROBIC/ANAEROBIC CULTURE W GRAM STAIN (SURGICAL/DEEP WOUND): Gram Stain: NONE SEEN

## 2020-12-26 LAB — CULTURE, BLOOD (ROUTINE X 2)
Culture: NO GROWTH
Culture: NO GROWTH
Special Requests: ADEQUATE

## 2020-12-26 MED ORDER — KETOROLAC TROMETHAMINE 15 MG/ML IJ SOLN
15.0000 mg | Freq: Four times a day (QID) | INTRAMUSCULAR | Status: DC | PRN
  Administered 2020-12-26 – 2020-12-29 (×10): 15 mg via INTRAVENOUS
  Filled 2020-12-26 (×10): qty 1

## 2020-12-26 MED ORDER — SODIUM CHLORIDE 0.9 % IV SOLN: INTRAVENOUS | Status: DC

## 2020-12-26 NOTE — Progress Notes (Signed)
PROGRESS NOTE    Sravya Grissom Rollyson  QAS:[id number] DOB: 06-24-1961 DOA: 12/18/2020 PCP: [name], MD   Chief Complain: Left hip pain  Brief Narrative: Patient is a 60 year old female with history of chronic knee pain, asthma who presented to the emergency department at [facility] with complaint of worsening left hip pain, inability to ambulate due to pain.  In the emergency department she was found to have fever, lab work showed leukocytosis.  MRI revealed left septic sacroiliac joint.  Orthopedics, IR , ID consulted.  Underwent  CT-guided aspiration of left on 12/20/20.  Blood culture showed MRSA,on vancomycin.Plan for TTE on Monday.  PT/OT recommended HH on discharge.  Assessment & Plan:   Principal Problem:   MRSA (methicillin resistant Staphylococcus aureus) carrier Active Problems:   SIRS (systemic inflammatory response syndrome) (HCC)   Left hip pain   Septic joint (HCC)   Chronic SI joint pain   Fever   Acute bacterial endocarditis   Left septic sacroiliac joint: Presented with fever, left hip pain.  MRI of the lumbar spine showed left septic sacroiliac joint.  IR, orthopedics, ID consulted on admission. Underwent  CT-guided aspiration of left hip on 12/20/20 Final report of  joint fluid culture showed MRSA.  Patient on daptomycin Continue pain management, continues to complain of left hip pain.  We might repeat imaging of the left hip for the follow-up if she continues to complain of pain.  MRSA bacteremia: Presented with fever, leukocytosis.  Source is left hip.  Vancomycin,changed to Dapto.    Elevated CRP She has a pansystolic murmur.  TTE showed normal left ventricle ejection fraction, no valvular vegetations.  Plan for TEE on Monday Repeat blood culture sent on 12/21/2020, no growth till date.  Blood culture sent on 12/24/2020 is again showing gram-positive cocci.  We will follow up the report and might send new blood cultures.ID aware Leucocytosis  improving  Hypokalemia: Being supplemented with potassium  Chronic pain syndrome: Continue supportive care, pain medications.  Patient takes high-dose oxycodone at home on scheduled basis.  History of asthma: Currently saturating fine on room air.  No wheezing  Debility/deconditioning/left hip pain:  PT/OT recommended HH on discharge.  Morbid obesity: BMI of 36.6           DVT prophylaxis:lovenox Code Status: Full Family Communication: Called and discussed with daughter on phone on 12/25/20 Status is: Inpatient  Remains inpatient appropriate because:Inpatient level of care appropriate due to severity of illness   Dispo: The patient is from: Home              Anticipated d/c is to: Home              Patient currently is not medically stable to d/c.   Difficult to place patient No      Consultants: ID, orthopedics, IR  Procedures:  Antimicrobials:  Anti-infectives (From admission, onward)   Start     Dose/Rate Route Frequency Ordered Stop   12/24/20 1000  DAPTOmycin (CUBICIN) 800 mg in sodium chloride 0.9 % IVPB        10 mg/kg  79.4 kg (Adjusted) 132 mL/hr over 30 Minutes Intravenous Daily 12/24/20 0805     12/20/20 1900  vancomycin (VANCOREADY) IVPB 1500 mg/300 mL  Status:  Discontinued        1,500 mg 150 mL/hr over 120 Minutes Intravenous Every 12 hours 12/20/20 0613 12/24/20 0758   12/20/20 0645  vancomycin (VANCOREADY) IVPB 2000 mg/400 mL  2,000 mg 200 mL/hr over 120 Minutes Intravenous  Once 12/20/20 0554 12/20/20 0834      Subjective:  Patient seen and examined at the bedside this morning.  Hemodynamically stable.  No new complaints besides left hip pain.  Objective: Vitals:   12/25/20 2001 12/25/20 2057 12/26/20 0500 12/26/20 0532  BP:  126/69  (!) 156/82  Pulse:  91  88  Resp:  18  20  Temp:  99 F (37.2 C)  98.6 F (37 C)  TempSrc:    Oral  SpO2: 93% 95%  98%  Weight:   111.9 kg   Height:   5' 8"  (1.727 m)     Intake/Output  Summary (Last 24 hours) at 12/26/2020 0626 Last data filed at 12/26/2020 0600 Gross per 24 hour  Intake 766 ml  Output --  Net 766 ml   Filed Weights   12/18/20 1027 12/19/20 0236 12/26/20 0500  Weight: 113.4 kg 106.2 kg 111.9 kg    Examination:  General exam: Overall comfortable, not in distress, morbidly obese HEENT: PERRL Respiratory system:  no wheezes or crackles  Cardiovascular system: S1 & S2 heard, RRR.  Gastrointestinal system: Abdomen is nondistended, soft and nontender. Central nervous system: Alert and oriented Extremities: No edema, no clubbing ,no cyanosis Skin: No rashes, no ulcers,no icterus  Data Reviewed: I have personally reviewed following labs and imaging studies  CBC: Recent Labs  Lab 12/21/20 0503 12/23/20 0452 12/24/20 0401 12/25/20 0346 12/26/20 0305  WBC 13.8* 16.6* 18.2* 16.3* 14.6*  NEUTROABS 10.8* 12.9* 13.4* 11.9* 10.8*  HGB 13.3 11.6* 11.3* 10.6* 10.0*  HCT 41.7 35.4* 34.9* 33.6* 31.6*  MCV 94.8 91.9 93.6 95.2 94.3  PLT 179 261 309 363 948   Basic Metabolic Panel: Recent Labs  Lab 12/21/20 0503 12/23/20 0452 12/24/20 0401 12/25/20 0346 12/26/20 0305  NA 134* 137 137 144 141  K 3.8 3.2* 3.2* 3.1* 3.8  CL 97* 100 98 103 101  CO2 26 27 27 30  32  GLUCOSE 128* 119* 121* 103* 105*  BUN 12 13 11 13 10   CREATININE 0.51 0.41* 0.56 0.63 0.57  CALCIUM 9.8 9.3 9.2 9.4 8.9   GFR: Estimated Creatinine Clearance: 99.3 mL/min (by C-G formula based on SCr of 0.57 mg/dL). Liver Function Tests: No results for input(s): AST, ALT, ALKPHOS, BILITOT, PROT, ALBUMIN in the last 168 hours. No results for input(s): LIPASE, AMYLASE in the last 168 hours. No results for input(s): AMMONIA in the last 168 hours. Coagulation Profile: No results for input(s): INR, PROTIME in the last 168 hours. Cardiac Enzymes: Recent Labs  Lab 12/25/20 0346  CKTOTAL 17*   BNP (last 3 results) No results for input(s): PROBNP in the last 8760 hours. HbA1C: No results  for input(s): HGBA1C in the last 72 hours. CBG: No results for input(s): GLUCAP in the last 168 hours. Lipid Profile: No results for input(s): CHOL, HDL, LDLCALC, TRIG, CHOLHDL, LDLDIRECT in the last 72 hours. Thyroid Function Tests: No results for input(s): TSH, T4TOTAL, FREET4, T3FREE, THYROIDAB in the last 72 hours. Anemia Panel: No results for input(s): VITAMINB12, FOLATE, FERRITIN, TIBC, IRON, RETICCTPCT in the last 72 hours. Sepsis Labs: No results for input(s): PROCALCITON, LATICACIDVEN in the last 168 hours.  Recent Results (from the past 240 hour(s))  Resp Panel by RT-PCR (Flu A&B, Covid) Nasopharyngeal Swab     Status: None   Collection Time: 12/18/20  9:10 PM   Specimen: Nasopharyngeal Swab; Nasopharyngeal(NP) swabs in vial transport medium  Result Value Ref  Range Status   SARS Coronavirus 2 by RT PCR NEGATIVE NEGATIVE Final    Comment: (NOTE) SARS-CoV-2 target nucleic acids are NOT DETECTED.  The SARS-CoV-2 RNA is generally detectable in upper respiratory specimens during the acute phase of infection. The lowest concentration of SARS-CoV-2 viral copies this assay can detect is 138 copies/mL. A negative result does not preclude SARS-Cov-2 infection and should not be used as the sole basis for treatment or other patient management decisions. A negative result may occur with  improper specimen collection/handling, submission of specimen other than nasopharyngeal swab, presence of viral mutation(s) within the areas targeted by this assay, and inadequate number of viral copies(<138 copies/mL). A negative result must be combined with clinical observations, patient history, and epidemiological information. The expected result is Negative.  Fact Sheet for Patients:  EntrepreneurPulse.com.au  Fact Sheet for Healthcare Providers:  IncredibleEmployment.be  This test is no t yet approved or cleared by the [place] FDA and  has been  authorized for detection and/or diagnosis of SARS-CoV-2 by FDA under an Emergency Use Authorization (EUA). This EUA will remain  in effect (meaning this test can be used) for the duration of the COVID-19 declaration under Section 564(b)(1) of the Act, 21 U.S.C.section 360bbb-3(b)(1), unless the authorization is terminated  or revoked sooner.       Influenza A by PCR NEGATIVE NEGATIVE Final   Influenza B by PCR NEGATIVE NEGATIVE Final    Comment: (NOTE) The Xpert Xpress SARS-CoV-2/FLU/RSV plus assay is intended as an aid in the diagnosis of influenza from Nasopharyngeal swab specimens and should not be used as a sole basis for treatment. Nasal washings and aspirates are unacceptable for Xpert Xpress SARS-CoV-2/FLU/RSV testing.  Fact Sheet for Patients: EntrepreneurPulse.com.au  Fact Sheet for Healthcare Providers: IncredibleEmployment.be  This test is not yet approved or cleared by the [place] FDA and has been authorized for detection and/or diagnosis of SARS-CoV-2 by FDA under an Emergency Use Authorization (EUA). This EUA will remain in effect (meaning this test can be used) for the duration of the COVID-19 declaration under Section 564(b)(1) of the Act, 21 U.S.C. section 360bbb-3(b)(1), unless the authorization is terminated or revoked.  Performed at [facility], [address] [place], [place] [address]   Culture, blood (routine x 2)     Status: Abnormal   Collection Time: 12/18/20 10:00 PM   Specimen: BLOOD  Result Value Ref Range Status   Specimen Description   Final    BLOOD RIGHT HAND Performed at [facility], [address], [place], [place] [address]    Special Requests   Final    BOTTLES DRAWN AEROBIC AND ANAEROBIC Blood Culture adequate volume Performed at [facility], [address] [place], [place] [address]    Culture  Setup Time   Final    GRAM  POSITIVE COCCI IN BOTH AEROBIC AND ANAEROBIC BOTTLES CRITICAL RESULT CALLED TO, READ BACK BY AND VERIFIED WITH: VALERIE POWELL,RN 12/20/2020 AT 0522 A.HUGHES    Culture (A)  Final    STAPHYLOCOCCUS AUREUS SUSCEPTIBILITIES PERFORMED ON PREVIOUS CULTURE WITHIN THE LAST 5 DAYS. Performed at [facility], [place] [address] [place], [place] [address]    Report Status 12/22/2020 FINAL  Final  Culture, blood (routine x 2)     Status: Abnormal   Collection Time: 12/18/20 10:00 PM   Specimen: BLOOD  Result Value Ref Range Status   Specimen Description   Final    BLOOD LEFT HAND Performed at [facility], [address] [place], [place] [address]    Special Requests   Final    BOTTLES DRAWN AEROBIC AND ANAEROBIC Blood Culture adequate volume Performed at [facility], [address], [place], [place] [address]    Culture  Setup Time   Final    GRAM POSITIVE COCCI IN BOTH AEROBIC AND ANAEROBIC BOTTLES CRITICAL RESULT CALLED TO, READ BACK BY AND VERIFIED WITH: VALERIE POWELL,RN 12/20/2020 AT [place] A.HUGHES Performed at [facility], [place] [address] [place],  [address]    Culture METHICILLIN RESISTANT STAPHYLOCOCCUS AUREUS (A)  Final   Report Status 12/22/2020 FINAL  Final   Organism ID, Bacteria METHICILLIN RESISTANT STAPHYLOCOCCUS AUREUS  Final      Susceptibility   Methicillin resistant staphylococcus aureus - MIC*    CIPROFLOXACIN >=8 RESISTANT Resistant     ERYTHROMYCIN >=8 RESISTANT Resistant     GENTAMICIN <=0.5 SENSITIVE Sensitive     OXACILLIN >=4 RESISTANT Resistant     TETRACYCLINE <=1 SENSITIVE Sensitive     VANCOMYCIN <=0.5 SENSITIVE Sensitive     TRIMETH/SULFA <=10 SENSITIVE Sensitive     CLINDAMYCIN <=0.25 SENSITIVE Sensitive     RIFAMPIN <=0.5 SENSITIVE Sensitive     Inducible Clindamycin NEGATIVE Sensitive     * METHICILLIN RESISTANT STAPHYLOCOCCUS AUREUS  Blood Culture ID Panel (Reflexed)     Status: Abnormal    Collection Time: 12/18/20 10:00 PM  Result Value Ref Range Status   Enterococcus faecalis NOT DETECTED NOT DETECTED Final   Enterococcus Faecium NOT DETECTED NOT DETECTED Final   Listeria monocytogenes NOT DETECTED NOT DETECTED Final   Staphylococcus species DETECTED (A) NOT DETECTED Final    Comment: CRITICAL RESULT CALLED TO, READ BACK BY AND VERIFIED WITH: VALERIE POWELL,RN 12/20/2020 AT 0522 A.HUGHES    Staphylococcus aureus (BCID) DETECTED (A) NOT DETECTED Final    Comment: Methicillin (oxacillin)-resistant Staphylococcus aureus (MRSA). MRSA is predictably resistant to beta-lactam antibiotics (except ceftaroline). Preferred therapy is vancomycin unless clinically contraindicated. Patient requires contact precautions if  hospitalized. CRITICAL RESULT CALLED TO, READ BACK BY AND VERIFIED WITH: VALERIE POWELL,RN 12/20/2020 AT 0522 A.HUGHES    Staphylococcus epidermidis NOT DETECTED NOT DETECTED Final   Staphylococcus lugdunensis NOT DETECTED NOT DETECTED Final   Streptococcus species NOT DETECTED NOT DETECTED Final   Streptococcus agalactiae NOT DETECTED NOT DETECTED Final   Streptococcus pneumoniae NOT DETECTED NOT DETECTED Final   Streptococcus pyogenes NOT DETECTED NOT DETECTED Final   A.calcoaceticus-baumannii NOT DETECTED NOT DETECTED Final   Bacteroides fragilis NOT DETECTED NOT DETECTED Final   Enterobacterales NOT DETECTED NOT DETECTED Final   Enterobacter cloacae complex NOT DETECTED NOT DETECTED Final   Escherichia coli NOT DETECTED NOT DETECTED Final   Klebsiella aerogenes NOT DETECTED NOT DETECTED Final   Klebsiella oxytoca NOT DETECTED NOT DETECTED Final   Klebsiella pneumoniae NOT DETECTED NOT DETECTED Final   Proteus species NOT DETECTED NOT DETECTED Final   Salmonella species NOT DETECTED NOT DETECTED Final   Serratia marcescens NOT DETECTED NOT DETECTED Final   Haemophilus influenzae NOT DETECTED NOT DETECTED Final   Neisseria meningitidis NOT DETECTED NOT  DETECTED Final   Pseudomonas aeruginosa NOT DETECTED NOT DETECTED Final   Stenotrophomonas maltophilia NOT DETECTED NOT DETECTED Final   Candida albicans NOT DETECTED NOT DETECTED Final   Candida auris NOT DETECTED NOT DETECTED Final   Candida glabrata NOT DETECTED NOT DETECTED Final   Candida krusei NOT DETECTED NOT DETECTED Final   Candida parapsilosis NOT DETECTED NOT DETECTED Final  Candida tropicalis NOT DETECTED NOT DETECTED Final   Cryptococcus neoformans/gattii NOT DETECTED NOT DETECTED Final   Meth resistant mecA/C and MREJ DETECTED (A) NOT DETECTED Final    Comment: CRITICAL RESULT CALLED TO, READ BACK BY AND VERIFIED WITH: VALERIE POWELL,RN 12/20/2020 AT 0522 A.HUGHES Performed at [facility], [place] [address] [place], [place] [address]   Aerobic/Anaerobic Culture (surgical/deep wound)     Status: None (Preliminary result)   Collection Time: 12/20/20 10:02 AM   Specimen: Abscess  Result Value Ref Range Status   Specimen Description   Final    ABSCESS LT SI JOINT ASPIRATION Performed at [facility] [address], [place], [place] [address]    Special Requests   Final    NONE Performed at [facility], [place] [address], [place], [place] [address]    Gram Stain NO WBC SEEN RARE GRAM POSITIVE COCCI   Final   Culture   Final    FEW METHICILLIN RESISTANT STAPHYLOCOCCUS AUREUS NO ANAEROBES ISOLATED Performed at [facility], [place] [address], [place], [place] [address]    Report Status PENDING  Incomplete   Organism ID, Bacteria METHICILLIN RESISTANT STAPHYLOCOCCUS AUREUS  Final      Susceptibility   Methicillin resistant staphylococcus aureus - MIC*    CIPROFLOXACIN >=8 RESISTANT Resistant     ERYTHROMYCIN >=8 RESISTANT Resistant     GENTAMICIN <=0.5 SENSITIVE Sensitive     OXACILLIN >=4 RESISTANT Resistant     TETRACYCLINE <=1 SENSITIVE Sensitive     VANCOMYCIN <=0.5 SENSITIVE Sensitive     TRIMETH/SULFA <=10  SENSITIVE Sensitive     CLINDAMYCIN <=0.25 SENSITIVE Sensitive     RIFAMPIN <=0.5 SENSITIVE Sensitive     Inducible Clindamycin NEGATIVE Sensitive     * FEW METHICILLIN RESISTANT STAPHYLOCOCCUS AUREUS  Culture, blood (routine x 2)     Status: None (Preliminary result)   Collection Time: 12/21/20 12:09 PM   Specimen: BLOOD  Result Value Ref Range Status   Specimen Description   Final    BLOOD LEFT ANTECUBITAL Performed at [facility] [address], [place], [place] [address]    Special Requests   Final    BOTTLES DRAWN AEROBIC AND ANAEROBIC Blood Culture results may not be optimal due to an excessive volume of blood received in culture bottles   Culture   Final    NO GROWTH 4 DAYS Performed at [facility], [place] [address], [place], [place] [address]    Report Status PENDING  Incomplete  Culture, blood (routine x 2)     Status: None (Preliminary result)   Collection Time: 12/21/20 12:20 PM   Specimen: BLOOD RIGHT HAND  Result Value Ref Range Status   Specimen Description   Final    BLOOD RIGHT HAND Performed at [facility] [address] [place], [place] [address]    Special Requests   Final    BOTTLES DRAWN AEROBIC AND ANAEROBIC Blood Culture adequate volume Performed at [facility] [address], [place], [place] [address]    Culture   Final    NO GROWTH 4 DAYS Performed at [facility], [place] [address], [place], [place] [address]    Report Status PENDING  Incomplete  Culture, blood (routine x 2)     Status: None (Preliminary result)   Collection Time: 12/24/20 11:53 AM   Specimen: BLOOD LEFT HAND  Result Value Ref Range Status  Specimen Description   Final    BLOOD LEFT HAND Performed at [facility] [address] [place], [place] [address]    Special Requests   Final    BOTTLES DRAWN AEROBIC ONLY Blood Culture adequate volume Performed at [facility] [address], [place], [place] [address]    Culture  Setup Time   Final    AEROBIC BOTTLE ONLY GRAM POSITIVE COCCI CRITICAL RESULT CALLED TO, READ BACK BY AND VERIFIED WITH: C. Roseanne Kaufman, AT 0102 12/25/20 [place] Performed at [facility], [place] [address] [place], [place] [address]    Culture GRAM POSITIVE COCCI  Final   Report Status PENDING  Incomplete  Blood Culture ID Panel (Reflexed)     Status: Abnormal   Collection Time: 12/24/20 11:53 AM  Result Value Ref Range Status   Enterococcus faecalis NOT DETECTED NOT DETECTED Final   Enterococcus Faecium NOT DETECTED NOT DETECTED Final   Listeria monocytogenes NOT DETECTED NOT DETECTED Final   Staphylococcus species DETECTED (A) NOT DETECTED Final    Comment: CRITICAL RESULT CALLED TO, READ BACK BY AND VERIFIED WITH: C. SHADE PHARMD, AT 6440 12/25/20 D. VANHOOK    Staphylococcus aureus (BCID) DETECTED (A) NOT DETECTED Final    Comment: Methicillin (oxacillin)-resistant Staphylococcus aureus (MRSA). MRSA is predictably resistant to beta-lactam antibiotics (except ceftaroline). Preferred therapy is vancomycin unless clinically contraindicated. Patient requires contact precautions if  hospitalized. CRITICAL RESULT CALLED TO, READ BACK BY AND VERIFIED WITH: C. SHADE PHARMD, AT 3474 12/25/20 D. VANHOOK    Staphylococcus epidermidis NOT DETECTED NOT DETECTED Final   Staphylococcus lugdunensis NOT DETECTED NOT DETECTED Final   Streptococcus species NOT DETECTED NOT DETECTED Final   Streptococcus agalactiae NOT DETECTED NOT DETECTED Final   Streptococcus pneumoniae NOT DETECTED NOT DETECTED Final   Streptococcus pyogenes NOT DETECTED NOT DETECTED Final   A.calcoaceticus-baumannii NOT DETECTED NOT DETECTED Final   Bacteroides fragilis NOT DETECTED NOT DETECTED Final   Enterobacterales NOT DETECTED NOT DETECTED Final   Enterobacter cloacae complex NOT DETECTED NOT DETECTED Final   Escherichia coli NOT DETECTED NOT DETECTED Final    Klebsiella aerogenes NOT DETECTED NOT DETECTED Final   Klebsiella oxytoca NOT DETECTED NOT DETECTED Final   Klebsiella pneumoniae NOT DETECTED NOT DETECTED Final   Proteus species NOT DETECTED NOT DETECTED Final   Salmonella species NOT DETECTED NOT DETECTED Final   Serratia marcescens NOT DETECTED NOT DETECTED Final   Haemophilus influenzae NOT DETECTED NOT DETECTED Final   Neisseria meningitidis NOT DETECTED NOT DETECTED Final   Pseudomonas aeruginosa NOT DETECTED NOT DETECTED Final   Stenotrophomonas maltophilia NOT DETECTED NOT DETECTED Final   Candida albicans NOT DETECTED NOT DETECTED Final   Candida auris NOT DETECTED NOT DETECTED Final   Candida glabrata NOT DETECTED NOT DETECTED Final   Candida krusei NOT DETECTED NOT DETECTED Final   Candida parapsilosis NOT DETECTED NOT DETECTED Final   Candida tropicalis NOT DETECTED NOT DETECTED Final   Cryptococcus neoformans/gattii NOT DETECTED NOT DETECTED Final   Meth resistant mecA/C and MREJ DETECTED (A) NOT DETECTED Final    Comment: CRITICAL RESULT CALLED TO, READ BACK BY AND VERIFIED WITH: C. Roseanne Kaufman, AT 2595 12/25/20 [place] Performed at [facility], [place] [address] [place], [place] [address]          Radiology Studies: [place] EKG SITE RITE  Result Date: 12/24/2020 If [facility] image not attached, placement could not be confirmed due to current  cardiac rhythm.       Scheduled Meds: . Chlorhexidine Gluconate Cloth  6 each Topical Daily  . enoxaparin (LOVENOX) injection  50 mg Subcutaneous Q24H  . gabapentin  300 mg Oral QHS  . lidocaine  1 patch Transdermal Q24H  . mometasone-formoterol  2 puff Inhalation BID  . montelukast  10 mg Oral q morning  . pantoprazole  40 mg Oral Daily  . polyethylene glycol  17 g Oral Daily  . senna-docusate  1 tablet Oral BID   Continuous Infusions: . DAPTOmycin (CUBICIN)  IV Stopped (12/25/20 2120)  . methocarbamol (ROBAXIN) IV 1,000 mg (12/26/20 0326)     LOS: 7 days     Time spent: 35 mins.More than 50% of that time was spent in counseling and/or coordination of care.      Shelly Coss, MD [facility] P5/03/2021, 8:08 AM

## 2020-12-27 ENCOUNTER — Inpatient Hospital Stay (HOSPITAL_COMMUNITY): Payer: PRIVATE HEALTH INSURANCE

## 2020-12-27 ENCOUNTER — Inpatient Hospital Stay (HOSPITAL_COMMUNITY): Payer: PRIVATE HEALTH INSURANCE | Admitting: Anesthesiology

## 2020-12-27 ENCOUNTER — Encounter (HOSPITAL_COMMUNITY)
Admission: EM | Disposition: A | Discharge status: Home Health | Payer: Self-pay | Source: Home / Self Care | Attending: Internal Medicine

## 2020-12-27 ENCOUNTER — Encounter (HOSPITAL_COMMUNITY): Payer: Self-pay | Admitting: Internal Medicine

## 2020-12-27 DIAGNOSIS — R7881 Bacteremia: Secondary | ICD-10-CM

## 2020-12-27 HISTORY — PX: TEE WITHOUT CARDIOVERSION: SHX5443

## 2020-12-27 LAB — CULTURE, BLOOD (ROUTINE X 2): Special Requests: ADEQUATE

## 2020-12-27 LAB — ECHO TEE
AV Mean grad: 34 mmHg
AV Peak grad: 59.9 mmHg
Ao pk vel: 3.87 m/s

## 2020-12-27 SURGERY — ECHOCARDIOGRAM, TRANSESOPHAGEAL
Anesthesia: Monitor Anesthesia Care

## 2020-12-27 MED ORDER — FENTANYL CITRATE (PF) 100 MCG/2ML IJ SOLN
INTRAMUSCULAR | Status: AC
  Filled 2020-12-27: qty 4

## 2020-12-27 MED ORDER — MIDAZOLAM HCL (PF) 10 MG/2ML IJ SOLN
2.0000 mg | INTRAMUSCULAR | Status: DC | PRN
  Administered 2020-12-27 (×5): 1 mg via INTRAVENOUS
  Administered 2020-12-27 (×2): 2 mg via INTRAVENOUS

## 2020-12-27 MED ORDER — MIDAZOLAM HCL (PF) 5 MG/ML IJ SOLN
INTRAMUSCULAR | Status: AC
  Filled 2020-12-27: qty 2

## 2020-12-27 MED ORDER — IOHEXOL 300 MG/ML  SOLN
100.0000 mL | Freq: Once | INTRAMUSCULAR | Status: AC | PRN
  Administered 2020-12-27: 100 mL via INTRAVENOUS

## 2020-12-27 MED ORDER — SODIUM CHLORIDE 0.9 % IV SOLN: INTRAVENOUS | Status: DC

## 2020-12-27 MED ORDER — FENTANYL CITRATE (PF) 100 MCG/2ML IJ SOLN
25.0000 ug | INTRAMUSCULAR | Status: DC | PRN
  Administered 2020-12-27 (×2): 25 ug via INTRAVENOUS
  Administered 2020-12-27: 50 ug via INTRAVENOUS
  Administered 2020-12-27 (×3): 25 ug via INTRAVENOUS

## 2020-12-27 MED ORDER — HYDROMORPHONE HCL 1 MG/ML IJ SOLN
1.0000 mg | Freq: Once | INTRAMUSCULAR | Status: AC
Start: 2020-12-27 — End: 2020-12-27
  Administered 2020-12-27: 1 mg via INTRAVENOUS
  Filled 2020-12-27: qty 1

## 2020-12-27 NOTE — H&P (View-Only) (Signed)
PROGRESS NOTE    Nicole Hines  YTK:[id number] DOB: 1961-02-03 DOA: 12/18/2020 PCP: [name], MD   Chief Complain: Left hip pain  Brief Narrative: Patient is a 60 year old female with history of chronic knee pain, asthma who presented to the emergency department at [facility] with complaint of worsening left hip pain, inability to ambulate due to pain.  In the emergency department she was found to have fever, lab work showed leukocytosis.  MRI revealed left septic sacroiliac joint.  Orthopedics, IR , ID consulted.  Underwent  CT-guided aspiration of left on 12/20/20.  Blood culture showed MRSA,on vancomycin.Plan for TTE on Monday.  PT/OT recommended HH on discharge.  Assessment & Plan:   Principal Problem:   MRSA (methicillin resistant Staphylococcus aureus) carrier Active Problems:   SIRS (systemic inflammatory response syndrome) (HCC)   Left hip pain   Septic joint (HCC)   Chronic SI joint pain   Fever   Acute bacterial endocarditis   Left septic sacroiliac joint: Presented with fever, left hip pain.  MRI of the lumbar spine showed left septic sacroiliac joint.  IR, orthopedics, ID consulted on admission. Underwent  CT-guided aspiration of left hip on 12/20/20 Final report of  joint fluid culture showed MRSA.  Patient on daptomycin Continue pain management, continues to complain of left hip pain.  We will do a repeat imaging ( CT)of the left hip for the follow-up   MRSA bacteremia: Presented with fever, leukocytosis.  Source is left hip.  Vancomycin,changed to Dapto.    Elevated CRP She has a pansystolic murmur.  TTE showed normal left ventricle ejection fraction, no valvular vegetations.  Plan for TEE on Monday Repeat blood culture sent on 12/21/2020, no growth till date.  Blood culture sent on 12/24/2020 is again showing MRSA.  We  might need to send new blood cultures,take PICC line out.ID aware Leucocytosis improving  Hypokalemia:Supplemented and corrected  Chronic  pain syndrome: Continue supportive care, pain medications.  Patient takes high-dose oxycodone at home on scheduled basis.  History of asthma: Currently saturating fine on room air.  No wheezing  Debility/deconditioning/left hip pain:  PT/OT recommended HH on discharge.  Morbid obesity: BMI of 36.6           DVT prophylaxis:lovenox Code Status: Full Family Communication: Called and discussed with daughter on phone on 12/27/20 Status is: Inpatient  Remains inpatient appropriate because:Inpatient level of care appropriate due to severity of illness   Dispo: The patient is from: Home              Anticipated d/c is to: Home              Patient currently is not medically stable to d/c.   Difficult to place patient No      Consultants: ID, orthopedics, IR  Procedures:  Antimicrobials:  Anti-infectives (From admission, onward)   Start     Dose/Rate Route Frequency Ordered Stop   12/24/20 1000  DAPTOmycin (CUBICIN) 800 mg in sodium chloride 0.9 % IVPB        10 mg/kg  79.4 kg (Adjusted) 132 mL/hr over 30 Minutes Intravenous Daily 12/24/20 0805     12/20/20 1900  vancomycin (VANCOREADY) IVPB 1500 mg/300 mL  Status:  Discontinued        1,500 mg 150 mL/hr over 120 Minutes Intravenous Every 12 hours 12/20/20 0613 12/24/20 0758   12/20/20 0645  vancomycin (VANCOREADY) IVPB 2000 mg/400 mL        2,000 mg  200 mL/hr over 120 Minutes Intravenous  Once 12/20/20 0554 12/20/20 0834      Subjective:  Patient seen and examined the bedside this morning.  Continues to complain of pain on the left posterior hip, plan for TEE today  Objective: Vitals:   12/26/20 1607 12/26/20 2119 12/27/20 0541 12/27/20 0542  BP: 137/66 139/71 (!) 157/97 (!) 158/90  Pulse: 94 83 79 80  Resp: 20 20 20    Temp:  99 F (37.2 C) 99.1 F (37.3 C)   TempSrc:      SpO2: 99% 94% 95% 97%  Weight:      Height:        Intake/Output Summary (Last 24 hours) at 12/27/2020 0744 Last data filed at 12/27/2020  0651 Gross per 24 hour  Intake 746.73 ml  Output --  Net 746.73 ml   Filed Weights   12/18/20 1027 12/19/20 0236 12/26/20 0500  Weight: 113.4 kg 106.2 kg 111.9 kg    Examination:  General exam: In moderate distress due to left hip pain, morbidly obese HEENT: PERRL Respiratory system:  no wheezes or crackles  Cardiovascular system: S1 & S2 heard, RRR.  Gastrointestinal system: Abdomen is nondistended, soft and nontender. Central nervous system: Alert and oriented Extremities: No edema, no clubbing ,no cyanosis Skin: No rashes, no ulcers,no icterus   Data Reviewed: I have personally reviewed following labs and imaging studies  CBC: Recent Labs  Lab 12/21/20 0503 12/23/20 0452 12/24/20 0401 12/25/20 0346 12/26/20 0305  WBC 13.8* 16.6* 18.2* 16.3* 14.6*  NEUTROABS 10.8* 12.9* 13.4* 11.9* 10.8*  HGB 13.3 11.6* 11.3* 10.6* 10.0*  HCT 41.7 35.4* 34.9* 33.6* 31.6*  MCV 94.8 91.9 93.6 95.2 94.3  PLT 179 261 309 363 825   Basic Metabolic Panel: Recent Labs  Lab 12/21/20 0503 12/23/20 0452 12/24/20 0401 12/25/20 0346 12/26/20 0305  NA 134* 137 137 144 141  K 3.8 3.2* 3.2* 3.1* 3.8  CL 97* 100 98 103 101  CO2 26 27 27 30  32  GLUCOSE 128* 119* 121* 103* 105*  BUN 12 13 11 13 10   CREATININE 0.51 0.41* 0.56 0.63 0.57  CALCIUM 9.8 9.3 9.2 9.4 8.9   GFR: Estimated Creatinine Clearance: 99.3 mL/min (by C-G formula based on SCr of 0.57 mg/dL). Liver Function Tests: No results for input(s): AST, ALT, ALKPHOS, BILITOT, PROT, ALBUMIN in the last 168 hours. No results for input(s): LIPASE, AMYLASE in the last 168 hours. No results for input(s): AMMONIA in the last 168 hours. Coagulation Profile: No results for input(s): INR, PROTIME in the last 168 hours. Cardiac Enzymes: Recent Labs  Lab 12/25/20 0346  CKTOTAL 17*   BNP (last 3 results) No results for input(s): PROBNP in the last 8760 hours. HbA1C: No results for input(s): HGBA1C in the last 72 hours. CBG: No  results for input(s): GLUCAP in the last 168 hours. Lipid Profile: No results for input(s): CHOL, HDL, LDLCALC, TRIG, CHOLHDL, LDLDIRECT in the last 72 hours. Thyroid Function Tests: No results for input(s): TSH, T4TOTAL, FREET4, T3FREE, THYROIDAB in the last 72 hours. Anemia Panel: No results for input(s): VITAMINB12, FOLATE, FERRITIN, TIBC, IRON, RETICCTPCT in the last 72 hours. Sepsis Labs: No results for input(s): PROCALCITON, LATICACIDVEN in the last 168 hours.  Recent Results (from the past 240 hour(s))  Resp Panel by RT-PCR (Flu A&B, Covid) Nasopharyngeal Swab     Status: None   Collection Time: 12/18/20  9:10 PM   Specimen: Nasopharyngeal Swab; Nasopharyngeal(NP) swabs in vial transport medium  Result  Value Ref Range Status   SARS Coronavirus 2 by RT PCR NEGATIVE NEGATIVE Final    Comment: (NOTE) SARS-CoV-2 target nucleic acids are NOT DETECTED.  The SARS-CoV-2 RNA is generally detectable in upper respiratory specimens during the acute phase of infection. The lowest concentration of SARS-CoV-2 viral copies this assay can detect is 138 copies/mL. A negative result does not preclude SARS-Cov-2 infection and should not be used as the sole basis for treatment or other patient management decisions. A negative result may occur with  improper specimen collection/handling, submission of specimen other than nasopharyngeal swab, presence of viral mutation(s) within the areas targeted by this assay, and inadequate number of viral copies(<138 copies/mL). A negative result must be combined with clinical observations, patient history, and epidemiological information. The expected result is Negative.  Fact Sheet for Patients:  EntrepreneurPulse.com.au  Fact Sheet for Healthcare Providers:  IncredibleEmployment.be  This test is no t yet approved or cleared by the [place] FDA and  has been authorized for detection and/or diagnosis of SARS-CoV-2  by FDA under an Emergency Use Authorization (EUA). This EUA will remain  in effect (meaning this test can be used) for the duration of the COVID-19 declaration under Section 564(b)(1) of the Act, 21 U.S.C.section 360bbb-3(b)(1), unless the authorization is terminated  or revoked sooner.       Influenza A by PCR NEGATIVE NEGATIVE Final   Influenza B by PCR NEGATIVE NEGATIVE Final    Comment: (NOTE) The Xpert Xpress SARS-CoV-2/FLU/RSV plus assay is intended as an aid in the diagnosis of influenza from Nasopharyngeal swab specimens and should not be used as a sole basis for treatment. Nasal washings and aspirates are unacceptable for Xpert Xpress SARS-CoV-2/FLU/RSV testing.  Fact Sheet for Patients: EntrepreneurPulse.com.au  Fact Sheet for Healthcare Providers: IncredibleEmployment.be  This test is not yet approved or cleared by the [place] FDA and has been authorized for detection and/or diagnosis of SARS-CoV-2 by FDA under an Emergency Use Authorization (EUA). This EUA will remain in effect (meaning this test can be used) for the duration of the COVID-19 declaration under Section 564(b)(1) of the Act, 21 U.S.C. section 360bbb-3(b)(1), unless the authorization is terminated or revoked.  Performed at [facility], [address], [place], [place] [address]   Culture, blood (routine x 2)     Status: Abnormal   Collection Time: 12/18/20 10:00 PM   Specimen: BLOOD  Result Value Ref Range Status   Specimen Description   Final    BLOOD RIGHT HAND Performed at [facility], [address] [place], [place] [address]    Special Requests   Final    BOTTLES DRAWN AEROBIC AND ANAEROBIC Blood Culture adequate volume Performed at [facility], [address], [place], [place] [address]    Culture  Setup Time   Final    GRAM POSITIVE COCCI IN BOTH AEROBIC AND ANAEROBIC  BOTTLES CRITICAL RESULT CALLED TO, READ BACK BY AND VERIFIED WITH: VALERIE POWELL,RN 12/20/2020 AT 0522 A.HUGHES    Culture (A)  Final    STAPHYLOCOCCUS AUREUS SUSCEPTIBILITIES PERFORMED ON PREVIOUS CULTURE WITHIN THE LAST 5 DAYS. Performed at [facility], [place] [address] [place], [place] [address]    Report Status 12/22/2020 FINAL  Final  Culture, blood (routine x 2)     Status: Abnormal   Collection Time: 12/18/20 10:00 PM   Specimen: BLOOD  Result Value Ref Range Status   Specimen Description   Final    BLOOD LEFT HAND Performed  at [facility], [address], [place], [place] [address]    Special Requests   Final    BOTTLES DRAWN AEROBIC AND ANAEROBIC Blood Culture adequate volume Performed at [facility], [address] [place], [place] [address]    Culture  Setup Time   Final    GRAM POSITIVE COCCI IN BOTH AEROBIC AND ANAEROBIC BOTTLES CRITICAL RESULT CALLED TO, READ BACK BY AND VERIFIED WITH: VALERIE POWELL,RN 12/20/2020 AT [place] A.HUGHES Performed at [facility], [place] [address], [place], [place] [address]    Culture METHICILLIN RESISTANT STAPHYLOCOCCUS AUREUS (A)  Final   Report Status 12/22/2020 FINAL  Final   Organism ID, Bacteria METHICILLIN RESISTANT STAPHYLOCOCCUS AUREUS  Final      Susceptibility   Methicillin resistant staphylococcus aureus - MIC*    CIPROFLOXACIN >=8 RESISTANT Resistant     ERYTHROMYCIN >=8 RESISTANT Resistant     GENTAMICIN <=0.5 SENSITIVE Sensitive     OXACILLIN >=4 RESISTANT Resistant     TETRACYCLINE <=1 SENSITIVE Sensitive     VANCOMYCIN <=0.5 SENSITIVE Sensitive     TRIMETH/SULFA <=10 SENSITIVE Sensitive     CLINDAMYCIN <=0.25 SENSITIVE Sensitive     RIFAMPIN <=0.5 SENSITIVE Sensitive     Inducible Clindamycin NEGATIVE Sensitive     * METHICILLIN RESISTANT STAPHYLOCOCCUS AUREUS  Blood Culture ID Panel (Reflexed)     Status: Abnormal   Collection Time: 12/18/20 10:00 PM  Result  Value Ref Range Status   Enterococcus faecalis NOT DETECTED NOT DETECTED Final   Enterococcus Faecium NOT DETECTED NOT DETECTED Final   Listeria monocytogenes NOT DETECTED NOT DETECTED Final   Staphylococcus species DETECTED (A) NOT DETECTED Final    Comment: CRITICAL RESULT CALLED TO, READ BACK BY AND VERIFIED WITH: VALERIE POWELL,RN 12/20/2020 AT 0522 A.HUGHES    Staphylococcus aureus (BCID) DETECTED (A) NOT DETECTED Final    Comment: Methicillin (oxacillin)-resistant Staphylococcus aureus (MRSA). MRSA is predictably resistant to beta-lactam antibiotics (except ceftaroline). Preferred therapy is vancomycin unless clinically contraindicated. Patient requires contact precautions if  hospitalized. CRITICAL RESULT CALLED TO, READ BACK BY AND VERIFIED WITH: VALERIE POWELL,RN 12/20/2020 AT 0522 A.HUGHES    Staphylococcus epidermidis NOT DETECTED NOT DETECTED Final   Staphylococcus lugdunensis NOT DETECTED NOT DETECTED Final   Streptococcus species NOT DETECTED NOT DETECTED Final   Streptococcus agalactiae NOT DETECTED NOT DETECTED Final   Streptococcus pneumoniae NOT DETECTED NOT DETECTED Final   Streptococcus pyogenes NOT DETECTED NOT DETECTED Final   A.calcoaceticus-baumannii NOT DETECTED NOT DETECTED Final   Bacteroides fragilis NOT DETECTED NOT DETECTED Final   Enterobacterales NOT DETECTED NOT DETECTED Final   Enterobacter cloacae complex NOT DETECTED NOT DETECTED Final   Escherichia coli NOT DETECTED NOT DETECTED Final   Klebsiella aerogenes NOT DETECTED NOT DETECTED Final   Klebsiella oxytoca NOT DETECTED NOT DETECTED Final   Klebsiella pneumoniae NOT DETECTED NOT DETECTED Final   Proteus species NOT DETECTED NOT DETECTED Final   Salmonella species NOT DETECTED NOT DETECTED Final   Serratia marcescens NOT DETECTED NOT DETECTED Final   Haemophilus influenzae NOT DETECTED NOT DETECTED Final   Neisseria meningitidis NOT DETECTED NOT DETECTED Final   Pseudomonas aeruginosa NOT  DETECTED NOT DETECTED Final   Stenotrophomonas maltophilia NOT DETECTED NOT DETECTED Final   Candida albicans NOT DETECTED NOT DETECTED Final   Candida auris NOT DETECTED NOT DETECTED Final   Candida glabrata NOT DETECTED NOT DETECTED Final   Candida krusei NOT DETECTED NOT DETECTED Final   Candida parapsilosis NOT DETECTED NOT  DETECTED Final   Candida tropicalis NOT DETECTED NOT DETECTED Final   Cryptococcus neoformans/gattii NOT DETECTED NOT DETECTED Final   Meth resistant mecA/C and MREJ DETECTED (A) NOT DETECTED Final    Comment: CRITICAL RESULT CALLED TO, READ BACK BY AND VERIFIED WITH: VALERIE POWELL,RN 12/20/2020 AT 0522 A.HUGHES Performed at [facility], [place] [address] [place], [place] [address]   Aerobic/Anaerobic Culture (surgical/deep wound)     Status: None   Collection Time: 12/20/20 10:02 AM   Specimen: Abscess  Result Value Ref Range Status   Specimen Description   Final    ABSCESS LT SI JOINT ASPIRATION Performed at [place] [address], [place], [place] [address]    Special Requests   Final    NONE Performed at [facility], [place] [address], [place], [place] [address]    Gram Stain NO WBC SEEN RARE GRAM POSITIVE COCCI   Final   Culture   Final    FEW METHICILLIN RESISTANT STAPHYLOCOCCUS AUREUS NO ANAEROBES ISOLATED Performed at [facility], [place] [address], [place], [place] [address]    Report Status 12/26/2020 FINAL  Final   Organism ID, Bacteria METHICILLIN RESISTANT STAPHYLOCOCCUS AUREUS  Final      Susceptibility   Methicillin resistant staphylococcus aureus - MIC*    CIPROFLOXACIN >=8 RESISTANT Resistant     ERYTHROMYCIN >=8 RESISTANT Resistant     GENTAMICIN <=0.5 SENSITIVE Sensitive     OXACILLIN >=4 RESISTANT Resistant     TETRACYCLINE <=1 SENSITIVE Sensitive     VANCOMYCIN <=0.5 SENSITIVE Sensitive     TRIMETH/SULFA <=10 SENSITIVE Sensitive     CLINDAMYCIN <=0.25 SENSITIVE Sensitive      RIFAMPIN <=0.5 SENSITIVE Sensitive     Inducible Clindamycin NEGATIVE Sensitive     * FEW METHICILLIN RESISTANT STAPHYLOCOCCUS AUREUS  Culture, blood (routine x 2)     Status: None   Collection Time: 12/21/20 12:09 PM   Specimen: BLOOD  Result Value Ref Range Status   Specimen Description   Final    BLOOD LEFT ANTECUBITAL Performed at [facility] [address], [place], [place] [address]    Special Requests   Final    BOTTLES DRAWN AEROBIC AND ANAEROBIC Blood Culture results may not be optimal due to an excessive volume of blood received in culture bottles   Culture   Final    NO GROWTH 5 DAYS Performed at [facility], [place] [address], [place], [place] [address]    Report Status 12/26/2020 FINAL  Final  Culture, blood (routine x 2)     Status: None   Collection Time: 12/21/20 12:20 PM   Specimen: BLOOD RIGHT HAND  Result Value Ref Range Status   Specimen Description   Final    BLOOD RIGHT HAND Performed at [facility] [address] [place], [place] [address]    Special Requests   Final    BOTTLES DRAWN AEROBIC AND ANAEROBIC Blood Culture adequate volume Performed at [facility] [address], [place], [place] [address]    Culture   Final    NO GROWTH 5 DAYS Performed at [facility], [place] [address], [place],  [address]    Report Status 12/26/2020 FINAL  Final  Culture, blood (routine x 2)     Status: Abnormal (Preliminary result)   Collection Time: 12/24/20 11:53 AM   Specimen: BLOOD LEFT HAND  Result Value Ref Range Status  Specimen Description   Final    BLOOD LEFT HAND Performed at [facility] [address] [place], [place] [address]    Special Requests   Final    BOTTLES DRAWN AEROBIC ONLY Blood Culture adequate volume Performed at [facility] [address], [place], [place] [address]    Culture  Setup Time   Final    AEROBIC BOTTLE  ONLY GRAM POSITIVE COCCI CRITICAL RESULT CALLED TO, READ BACK BY AND VERIFIED WITH: C. SHADE PHARMD, AT 3428 12/25/20 D. Victoriano Lain    Culture (A)  Final    STAPHYLOCOCCUS AUREUS SUSCEPTIBILITIES TO FOLLOW Performed at [facility], [place] [address] [place], [place] [address]    Report Status PENDING  Incomplete  Blood Culture ID Panel (Reflexed)     Status: Abnormal   Collection Time: 12/24/20 11:53 AM  Result Value Ref Range Status   Enterococcus faecalis NOT DETECTED NOT DETECTED Final   Enterococcus Faecium NOT DETECTED NOT DETECTED Final   Listeria monocytogenes NOT DETECTED NOT DETECTED Final   Staphylococcus species DETECTED (A) NOT DETECTED Final    Comment: CRITICAL RESULT CALLED TO, READ BACK BY AND VERIFIED WITH: C. SHADE PHARMD, AT 5726 12/25/20 D. VANHOOK    Staphylococcus aureus (BCID) DETECTED (A) NOT DETECTED Final    Comment: Methicillin (oxacillin)-resistant Staphylococcus aureus (MRSA). MRSA is predictably resistant to beta-lactam antibiotics (except ceftaroline). Preferred therapy is vancomycin unless clinically contraindicated. Patient requires contact precautions if  hospitalized. CRITICAL RESULT CALLED TO, READ BACK BY AND VERIFIED WITH: C. SHADE PHARMD, AT 2035 12/25/20 D. VANHOOK    Staphylococcus epidermidis NOT DETECTED NOT DETECTED Final   Staphylococcus lugdunensis NOT DETECTED NOT DETECTED Final   Streptococcus species NOT DETECTED NOT DETECTED Final   Streptococcus agalactiae NOT DETECTED NOT DETECTED Final   Streptococcus pneumoniae NOT DETECTED NOT DETECTED Final   Streptococcus pyogenes NOT DETECTED NOT DETECTED Final   A.calcoaceticus-baumannii NOT DETECTED NOT DETECTED Final   Bacteroides fragilis NOT DETECTED NOT DETECTED Final   Enterobacterales NOT DETECTED NOT DETECTED Final   Enterobacter cloacae complex NOT DETECTED NOT DETECTED Final   Escherichia coli NOT DETECTED NOT DETECTED Final   Klebsiella aerogenes NOT DETECTED NOT DETECTED Final    Klebsiella oxytoca NOT DETECTED NOT DETECTED Final   Klebsiella pneumoniae NOT DETECTED NOT DETECTED Final   Proteus species NOT DETECTED NOT DETECTED Final   Salmonella species NOT DETECTED NOT DETECTED Final   Serratia marcescens NOT DETECTED NOT DETECTED Final   Haemophilus influenzae NOT DETECTED NOT DETECTED Final   Neisseria meningitidis NOT DETECTED NOT DETECTED Final   Pseudomonas aeruginosa NOT DETECTED NOT DETECTED Final   Stenotrophomonas maltophilia NOT DETECTED NOT DETECTED Final   Candida albicans NOT DETECTED NOT DETECTED Final   Candida auris NOT DETECTED NOT DETECTED Final   Candida glabrata NOT DETECTED NOT DETECTED Final   Candida krusei NOT DETECTED NOT DETECTED Final   Candida parapsilosis NOT DETECTED NOT DETECTED Final   Candida tropicalis NOT DETECTED NOT DETECTED Final   Cryptococcus neoformans/gattii NOT DETECTED NOT DETECTED Final   Meth resistant mecA/C and MREJ DETECTED (A) NOT DETECTED Final    Comment: CRITICAL RESULT CALLED TO, READ BACK BY AND VERIFIED WITH: C. Roseanne Kaufman, AT 5974 12/25/20 [place] Performed at [facility], [place] [address], [place],  [address]          Radiology Studies: No results found.      Scheduled Meds: . Chlorhexidine Gluconate Cloth  6 each Topical Daily  . enoxaparin (LOVENOX) injection  50 mg Subcutaneous Q24H  . gabapentin  300 mg Oral QHS  . lidocaine  1 patch Transdermal Q24H  . mometasone-formoterol  2 puff Inhalation BID  . montelukast  10 mg Oral q morning  . pantoprazole  40 mg Oral Daily  . polyethylene glycol  17 g Oral Daily  . senna-docusate  1 tablet Oral BID   Continuous Infusions: . sodium chloride 20 mL/hr at 12/27/20 0600  . DAPTOmycin (CUBICIN)  IV Stopped (12/26/20 2056)  . methocarbamol (ROBAXIN) IV Stopped (12/27/20 0243)     LOS: 8 days    Time spent: 35 mins.More than 50% of that time was spent in counseling and/or coordination of care.      Shelly Coss,  MD [facility] P5/04/2021, 7:44 AM

## 2020-12-27 NOTE — Progress Notes (Signed)
  Echocardiogram Echocardiogram Transesophageal has been performed.  Nicole Hines 12/27/2020, 3:04 PM

## 2020-12-27 NOTE — Anesthesia Preprocedure Evaluation (Signed)
Anesthesia Evaluation  Patient identified by MRN, date of birth, ID band Patient awake    Reviewed: Allergy & Precautions, NPO status , Patient's Chart, lab work & pertinent test results  Airway Mallampati: II  TM Distance: >3 FB Neck ROM: Full    Dental no notable dental hx.    Pulmonary neg pulmonary ROS,    Pulmonary exam normal breath sounds clear to auscultation       Cardiovascular Normal cardiovascular exam Rhythm:Regular Rate:Normal  . Left ventricular ejection fraction, by estimation, is 60 to 65%. The  left ventricle has normal function. The left ventricle has no regional  wall motion abnormalities. There is mild left ventricular hypertrophy.  Left ventricular diastolic parameters  were normal.  2. Right ventricular systolic function is normal. The right ventricular  size is normal.  3. Left atrial size was moderately dilated.  4. The pericardial effusion is anterior to the right ventricle.  5. The mitral valve is normal in structure. No evidence of mitral valve  regurgitation. No evidence of mitral stenosis.  6. Probably tri leaflet . The aortic valve was not well visualized. There  is moderate calcification of the aortic valve. Aortic valve regurgitation  is not visualized. Moderate aortic valve stenosis.  7. The inferior vena cava is normal in size with greater than 50%  respiratory variability, suggesting right atrial pressure of 3 mmHg.    Neuro/Psych negative neurological ROS  negative psych ROS   GI/Hepatic negative GI ROS, Neg liver ROS,   Endo/Other  negative endocrine ROS  Renal/GU negative Renal ROS  negative genitourinary   Musculoskeletal Septic hip joint   Abdominal   Peds negative pediatric ROS (+)  Hematology negative hematology ROS (+)   Anesthesia Other Findings   Reproductive/Obstetrics negative OB ROS                             Anesthesia  Physical Anesthesia Plan  ASA: III  Anesthesia Plan: MAC   Post-op Pain Management:    Induction: Intravenous  PONV Risk Score and Plan: 2 and Propofol infusion and Treatment may vary due to age or medical condition  Airway Management Planned: Simple Face Mask  Additional Equipment:   Intra-op Plan:   Post-operative Plan:   Informed Consent: I have reviewed the patients History and Physical, chart, labs and discussed the procedure including the risks, benefits and alternatives for the proposed anesthesia with the patient or authorized representative who has indicated his/her understanding and acceptance.     Dental advisory given  Plan Discussed with: CRNA and Surgeon  Anesthesia Plan Comments:         Anesthesia Quick Evaluation

## 2020-12-27 NOTE — Progress Notes (Signed)
Physical Therapy Treatment Patient Details Name: Nicole Hines MRN: [id number] DOB: Oct 19, 1960 Today's Date: 12/27/2020    History of Present Illness 60 year old female who presented to the emergency department at [facility] with complaint of worsening left hip pain, inability to ambulate due to pain.  In the emergency department she was found to have fever, lab work showed leukocytosis.   MRI revealed left septic sacroiliac joint. Dx of MRSA, SIRS, septic L SIJ. Pt with history of chronic knee pain, asthma    PT Comments    Pt reports that she is still experiencing significant pain in L SI joint area. She also reported pain in R hip area today when mobilizing. She was unable to ambulate outside of room today 2* pain. She is scheduled for a procedure at [facility] on today. Will continue to follow and progress activity as pt is able to tolerate. Encouraged her to continue to keep RN/MD aware of pain control issues.     Follow Up Recommendations  Home health PT     Equipment Recommendations  Rolling walker with 5" wheels;3in1 (PT)    Recommendations for Other Services       Precautions / Restrictions Precautions Precautions: Fall Restrictions Weight Bearing Restrictions: No    Mobility  Bed Mobility Overal bed mobility: Needs Assistance Bed Mobility: Supine to Sit;Sit to Supine     Supine to sit: Supervision;HOB elevated Sit to supine: Min guard   General bed mobility comments: Pt uses UEs to assist L LE back onto bed. Increased time and effort.    Transfers Overall transfer level: Needs assistance Equipment used: Rolling walker (2 wheeled) Transfers: Sit to/from Stand Sit to Stand: Min guard         General transfer comment: Pt stood x 2 and immediately fell back onto bed 2* sharp pain around R hip area, which pt states is new. With time, she was able to stand again and proceed with walking.  Ambulation/Gait Ambulation/Gait assistance: Min guard Gait  Distance (Feet): 10 Feet Assistive device: Rolling walker (2 wheeled) Gait Pattern/deviations: Step-through pattern;Antalgic;Decreased weight shift to left     General Gait Details: Gait is very effortful and painful. No LOB with RW use but pt relies heavily on UEs. Unable to tolerate ambulation outside of room on today 2* pain   Stairs             Wheelchair Mobility    Modified Rankin (Stroke Patients Only)       Balance Overall balance assessment: Needs assistance         Standing balance support: Bilateral upper extremity supported Standing balance-Leahy Scale: Poor                              Cognition Arousal/Alertness: Awake/alert Behavior During Therapy: WFL for tasks assessed/performed Overall Cognitive Status: Within Functional Limits for tasks assessed                                 General Comments: AxO x3 very motivated but limited by pain works at [employer] Comments        Pertinent Vitals/Pain Pain Assessment: Faces Faces Pain Scale: Hurts worst Pain Location: L SI joint  (and R hip today which pt states is new) Pain Descriptors / Indicators: Grimacing;Guarding;Sharp;Tender;Moaning Pain Intervention(s): Limited  activity within patient's tolerance;Repositioned    Home Living                      Prior Function            PT Goals (current goals can now be found in the care plan section) Progress towards PT goals: Progressing toward goals    Frequency    Min 3X/week      PT Plan Current plan remains appropriate    Co-evaluation              AM-PAC PT "6 Clicks" Mobility   Outcome Measure  Help needed turning from your back to your side while in a flat bed without using bedrails?: None Help needed moving from lying on your back to sitting on the side of a flat bed without using bedrails?: A Little Help needed moving to and from a bed to a chair  (including a wheelchair)?: A Little Help needed standing up from a chair using your arms (e.g., wheelchair or bedside chair)?: A Little Help needed to walk in hospital room?: A Little Help needed climbing 3-5 steps with a railing? : A Lot 6 Click Score: 18    End of Session   Activity Tolerance: Patient limited by pain Patient left: in bed;with call bell/phone within reach   PT Visit Diagnosis: Difficulty in walking, not elsewhere classified (R26.2);Pain Pain - Right/Left: Left Pain - part of body: Hip     Time: 5872-7618 PT Time Calculation (min) (ACUTE ONLY): 14 min  Charges:  $Gait Training: 8-22 mins                        Doreatha Massed, PT Acute Rehabilitation  Office: [phone] Pager: [phone]

## 2020-12-27 NOTE — Interval H&P Note (Signed)
History and Physical Interval Note:  12/27/2020 2:10 PM  Nicole Hines  has presented today for surgery, with the diagnosis of BACTEREMIA.  The various methods of treatment have been discussed with the patient and family. After consideration of risks, benefits and other options for treatment, the patient has consented to  Procedure(s): TRANSESOPHAGEAL ECHOCARDIOGRAM (TEE) (N/A) as a surgical intervention.  The patient's history has been reviewed, patient examined, no change in status, stable for surgery.  I have reviewed the patient's chart and labs.  Questions were answered to the patient's satisfaction.     [name]

## 2020-12-27 NOTE — Progress Notes (Signed)
PROGRESS NOTE    Nicole Hines  DJS:[id number] DOB: 07/21/1961 DOA: 12/18/2020 PCP: [name], MD   Chief Complain: Left hip pain  Brief Narrative: Patient is a 60 year old female with history of chronic knee pain, asthma who presented to the emergency department at [facility] with complaint of worsening left hip pain, inability to ambulate due to pain.  In the emergency department she was found to have fever, lab work showed leukocytosis.  MRI revealed left septic sacroiliac joint.  Orthopedics, IR , ID consulted.  Underwent  CT-guided aspiration of left on 12/20/20.  Blood culture showed MRSA,on vancomycin.Plan for TTE on Monday.  PT/OT recommended HH on discharge.  Assessment & Plan:   Principal Problem:   MRSA (methicillin resistant Staphylococcus aureus) carrier Active Problems:   SIRS (systemic inflammatory response syndrome) (HCC)   Left hip pain   Septic joint (HCC)   Chronic SI joint pain   Fever   Acute bacterial endocarditis   Left septic sacroiliac joint: Presented with fever, left hip pain.  MRI of the lumbar spine showed left septic sacroiliac joint.  IR, orthopedics, ID consulted on admission. Underwent  CT-guided aspiration of left hip on 12/20/20 Final report of  joint fluid culture showed MRSA.  Patient on daptomycin Continue pain management, continues to complain of left hip pain.  We will do a repeat imaging ( CT)of the left hip for the follow-up   MRSA bacteremia: Presented with fever, leukocytosis.  Source is left hip.  Vancomycin,changed to Dapto.    Elevated CRP She has a pansystolic murmur.  TTE showed normal left ventricle ejection fraction, no valvular vegetations.  Plan for TEE on Monday Repeat blood culture sent on 12/21/2020, no growth till date.  Blood culture sent on 12/24/2020 is again showing MRSA.  We  might need to send new blood cultures,take PICC line out.ID aware Leucocytosis improving  Hypokalemia:Supplemented and corrected  Chronic  pain syndrome: Continue supportive care, pain medications.  Patient takes high-dose oxycodone at home on scheduled basis.  History of asthma: Currently saturating fine on room air.  No wheezing  Debility/deconditioning/left hip pain:  PT/OT recommended HH on discharge.  Morbid obesity: BMI of 36.6           DVT prophylaxis:lovenox Code Status: Full Family Communication: Called and discussed with daughter on phone on 12/27/20 Status is: Inpatient  Remains inpatient appropriate because:Inpatient level of care appropriate due to severity of illness   Dispo: The patient is from: Home              Anticipated d/c is to: Home              Patient currently is not medically stable to d/c.   Difficult to place patient No      Consultants: ID, orthopedics, IR  Procedures:  Antimicrobials:  Anti-infectives (From admission, onward)   Start     Dose/Rate Route Frequency Ordered Stop   12/24/20 1000  DAPTOmycin (CUBICIN) 800 mg in sodium chloride 0.9 % IVPB        10 mg/kg  79.4 kg (Adjusted) 132 mL/hr over 30 Minutes Intravenous Daily 12/24/20 0805     12/20/20 1900  vancomycin (VANCOREADY) IVPB 1500 mg/300 mL  Status:  Discontinued        1,500 mg 150 mL/hr over 120 Minutes Intravenous Every 12 hours 12/20/20 0613 12/24/20 0758   12/20/20 0645  vancomycin (VANCOREADY) IVPB 2000 mg/400 mL        2,000 mg  200 mL/hr over 120 Minutes Intravenous  Once 12/20/20 0554 12/20/20 0834      Subjective:  Patient seen and examined the bedside this morning.  Continues to complain of pain on the left posterior hip, plan for TEE today  Objective: Vitals:   12/26/20 1607 12/26/20 2119 12/27/20 0541 12/27/20 0542  BP: 137/66 139/71 (!) 157/97 (!) 158/90  Pulse: 94 83 79 80  Resp: 20 20 20    Temp:  99 F (37.2 C) 99.1 F (37.3 C)   TempSrc:      SpO2: 99% 94% 95% 97%  Weight:      Height:        Intake/Output Summary (Last 24 hours) at 12/27/2020 0744 Last data filed at 12/27/2020  0651 Gross per 24 hour  Intake 746.73 ml  Output --  Net 746.73 ml   Filed Weights   12/18/20 1027 12/19/20 0236 12/26/20 0500  Weight: 113.4 kg 106.2 kg 111.9 kg    Examination:  General exam: In moderate distress due to left hip pain, morbidly obese HEENT: PERRL Respiratory system:  no wheezes or crackles  Cardiovascular system: S1 & S2 heard, RRR.  Gastrointestinal system: Abdomen is nondistended, soft and nontender. Central nervous system: Alert and oriented Extremities: No edema, no clubbing ,no cyanosis Skin: No rashes, no ulcers,no icterus   Data Reviewed: I have personally reviewed following labs and imaging studies  CBC: Recent Labs  Lab 12/21/20 0503 12/23/20 0452 12/24/20 0401 12/25/20 0346 12/26/20 0305  WBC 13.8* 16.6* 18.2* 16.3* 14.6*  NEUTROABS 10.8* 12.9* 13.4* 11.9* 10.8*  HGB 13.3 11.6* 11.3* 10.6* 10.0*  HCT 41.7 35.4* 34.9* 33.6* 31.6*  MCV 94.8 91.9 93.6 95.2 94.3  PLT 179 261 309 363 539   Basic Metabolic Panel: Recent Labs  Lab 12/21/20 0503 12/23/20 0452 12/24/20 0401 12/25/20 0346 12/26/20 0305  NA 134* 137 137 144 141  K 3.8 3.2* 3.2* 3.1* 3.8  CL 97* 100 98 103 101  CO2 26 27 27 30  32  GLUCOSE 128* 119* 121* 103* 105*  BUN 12 13 11 13 10   CREATININE 0.51 0.41* 0.56 0.63 0.57  CALCIUM 9.8 9.3 9.2 9.4 8.9   GFR: Estimated Creatinine Clearance: 99.3 mL/min (by C-G formula based on SCr of 0.57 mg/dL). Liver Function Tests: No results for input(s): AST, ALT, ALKPHOS, BILITOT, PROT, ALBUMIN in the last 168 hours. No results for input(s): LIPASE, AMYLASE in the last 168 hours. No results for input(s): AMMONIA in the last 168 hours. Coagulation Profile: No results for input(s): INR, PROTIME in the last 168 hours. Cardiac Enzymes: Recent Labs  Lab 12/25/20 0346  CKTOTAL 17*   BNP (last 3 results) No results for input(s): PROBNP in the last 8760 hours. HbA1C: No results for input(s): HGBA1C in the last 72 hours. CBG: No  results for input(s): GLUCAP in the last 168 hours. Lipid Profile: No results for input(s): CHOL, HDL, LDLCALC, TRIG, CHOLHDL, LDLDIRECT in the last 72 hours. Thyroid Function Tests: No results for input(s): TSH, T4TOTAL, FREET4, T3FREE, THYROIDAB in the last 72 hours. Anemia Panel: No results for input(s): VITAMINB12, FOLATE, FERRITIN, TIBC, IRON, RETICCTPCT in the last 72 hours. Sepsis Labs: No results for input(s): PROCALCITON, LATICACIDVEN in the last 168 hours.  Recent Results (from the past 240 hour(s))  Resp Panel by RT-PCR (Flu A&B, Covid) Nasopharyngeal Swab     Status: None   Collection Time: 12/18/20  9:10 PM   Specimen: Nasopharyngeal Swab; Nasopharyngeal(NP) swabs in vial transport medium  Result  Value Ref Range Status   SARS Coronavirus 2 by RT PCR NEGATIVE NEGATIVE Final    Comment: (NOTE) SARS-CoV-2 target nucleic acids are NOT DETECTED.  The SARS-CoV-2 RNA is generally detectable in upper respiratory specimens during the acute phase of infection. The lowest concentration of SARS-CoV-2 viral copies this assay can detect is 138 copies/mL. A negative result does not preclude SARS-Cov-2 infection and should not be used as the sole basis for treatment or other patient management decisions. A negative result may occur with  improper specimen collection/handling, submission of specimen other than nasopharyngeal swab, presence of viral mutation(s) within the areas targeted by this assay, and inadequate number of viral copies(<138 copies/mL). A negative result must be combined with clinical observations, patient history, and epidemiological information. The expected result is Negative.  Fact Sheet for Patients:  EntrepreneurPulse.com.au  Fact Sheet for Healthcare Providers:  IncredibleEmployment.be  This test is no t yet approved or cleared by the [place] FDA and  has been authorized for detection and/or diagnosis of SARS-CoV-2  by FDA under an Emergency Use Authorization (EUA). This EUA will remain  in effect (meaning this test can be used) for the duration of the COVID-19 declaration under Section 564(b)(1) of the Act, 21 U.S.C.section 360bbb-3(b)(1), unless the authorization is terminated  or revoked sooner.       Influenza A by PCR NEGATIVE NEGATIVE Final   Influenza B by PCR NEGATIVE NEGATIVE Final    Comment: (NOTE) The Xpert Xpress SARS-CoV-2/FLU/RSV plus assay is intended as an aid in the diagnosis of influenza from Nasopharyngeal swab specimens and should not be used as a sole basis for treatment. Nasal washings and aspirates are unacceptable for Xpert Xpress SARS-CoV-2/FLU/RSV testing.  Fact Sheet for Patients: EntrepreneurPulse.com.au  Fact Sheet for Healthcare Providers: IncredibleEmployment.be  This test is not yet approved or cleared by the [place] FDA and has been authorized for detection and/or diagnosis of SARS-CoV-2 by FDA under an Emergency Use Authorization (EUA). This EUA will remain in effect (meaning this test can be used) for the duration of the COVID-19 declaration under Section 564(b)(1) of the Act, 21 U.S.C. section 360bbb-3(b)(1), unless the authorization is terminated or revoked.  Performed at [facility], [address], [place], [place] [address]   Culture, blood (routine x 2)     Status: Abnormal   Collection Time: 12/18/20 10:00 PM   Specimen: BLOOD  Result Value Ref Range Status   Specimen Description   Final    BLOOD RIGHT HAND Performed at [facility], [address] [place], [place] [address]    Special Requests   Final    BOTTLES DRAWN AEROBIC AND ANAEROBIC Blood Culture adequate volume Performed at [facility], [address], [place], [place] [address]    Culture  Setup Time   Final    GRAM POSITIVE COCCI IN BOTH AEROBIC AND ANAEROBIC  BOTTLES CRITICAL RESULT CALLED TO, READ BACK BY AND VERIFIED WITH: VALERIE POWELL,RN 12/20/2020 AT 0522 A.HUGHES    Culture (A)  Final    STAPHYLOCOCCUS AUREUS SUSCEPTIBILITIES PERFORMED ON PREVIOUS CULTURE WITHIN THE LAST 5 DAYS. Performed at [facility], [place] [address] [place], [place] [address]    Report Status 12/22/2020 FINAL  Final  Culture, blood (routine x 2)     Status: Abnormal   Collection Time: 12/18/20 10:00 PM   Specimen: BLOOD  Result Value Ref Range Status   Specimen Description   Final    BLOOD LEFT HAND Performed  at [facility], [address] [place], [place] [address]    Special Requests   Final    BOTTLES DRAWN AEROBIC AND ANAEROBIC Blood Culture adequate volume Performed at [facility], [address], [place], [place] [address]    Culture  Setup Time   Final    GRAM POSITIVE COCCI IN BOTH AEROBIC AND ANAEROBIC BOTTLES CRITICAL RESULT CALLED TO, READ BACK BY AND VERIFIED WITH: VALERIE POWELL,RN 12/20/2020 AT [place] A.HUGHES Performed at [facility], [place] [address] [place], [place] [address]    Culture METHICILLIN RESISTANT STAPHYLOCOCCUS AUREUS (A)  Final   Report Status 12/22/2020 FINAL  Final   Organism ID, Bacteria METHICILLIN RESISTANT STAPHYLOCOCCUS AUREUS  Final      Susceptibility   Methicillin resistant staphylococcus aureus - MIC*    CIPROFLOXACIN >=8 RESISTANT Resistant     ERYTHROMYCIN >=8 RESISTANT Resistant     GENTAMICIN <=0.5 SENSITIVE Sensitive     OXACILLIN >=4 RESISTANT Resistant     TETRACYCLINE <=1 SENSITIVE Sensitive     VANCOMYCIN <=0.5 SENSITIVE Sensitive     TRIMETH/SULFA <=10 SENSITIVE Sensitive     CLINDAMYCIN <=0.25 SENSITIVE Sensitive     RIFAMPIN <=0.5 SENSITIVE Sensitive     Inducible Clindamycin NEGATIVE Sensitive     * METHICILLIN RESISTANT STAPHYLOCOCCUS AUREUS  Blood Culture ID Panel (Reflexed)     Status: Abnormal   Collection Time: 12/18/20 10:00 PM  Result  Value Ref Range Status   Enterococcus faecalis NOT DETECTED NOT DETECTED Final   Enterococcus Faecium NOT DETECTED NOT DETECTED Final   Listeria monocytogenes NOT DETECTED NOT DETECTED Final   Staphylococcus species DETECTED (A) NOT DETECTED Final    Comment: CRITICAL RESULT CALLED TO, READ BACK BY AND VERIFIED WITH: VALERIE POWELL,RN 12/20/2020 AT 0522 A.HUGHES    Staphylococcus aureus (BCID) DETECTED (A) NOT DETECTED Final    Comment: Methicillin (oxacillin)-resistant Staphylococcus aureus (MRSA). MRSA is predictably resistant to beta-lactam antibiotics (except ceftaroline). Preferred therapy is vancomycin unless clinically contraindicated. Patient requires contact precautions if  hospitalized. CRITICAL RESULT CALLED TO, READ BACK BY AND VERIFIED WITH: VALERIE POWELL,RN 12/20/2020 AT 0522 A.HUGHES    Staphylococcus epidermidis NOT DETECTED NOT DETECTED Final   Staphylococcus lugdunensis NOT DETECTED NOT DETECTED Final   Streptococcus species NOT DETECTED NOT DETECTED Final   Streptococcus agalactiae NOT DETECTED NOT DETECTED Final   Streptococcus pneumoniae NOT DETECTED NOT DETECTED Final   Streptococcus pyogenes NOT DETECTED NOT DETECTED Final   A.calcoaceticus-baumannii NOT DETECTED NOT DETECTED Final   Bacteroides fragilis NOT DETECTED NOT DETECTED Final   Enterobacterales NOT DETECTED NOT DETECTED Final   Enterobacter cloacae complex NOT DETECTED NOT DETECTED Final   Escherichia coli NOT DETECTED NOT DETECTED Final   Klebsiella aerogenes NOT DETECTED NOT DETECTED Final   Klebsiella oxytoca NOT DETECTED NOT DETECTED Final   Klebsiella pneumoniae NOT DETECTED NOT DETECTED Final   Proteus species NOT DETECTED NOT DETECTED Final   Salmonella species NOT DETECTED NOT DETECTED Final   Serratia marcescens NOT DETECTED NOT DETECTED Final   Haemophilus influenzae NOT DETECTED NOT DETECTED Final   Neisseria meningitidis NOT DETECTED NOT DETECTED Final   Pseudomonas aeruginosa NOT  DETECTED NOT DETECTED Final   Stenotrophomonas maltophilia NOT DETECTED NOT DETECTED Final   Candida albicans NOT DETECTED NOT DETECTED Final   Candida auris NOT DETECTED NOT DETECTED Final   Candida glabrata NOT DETECTED NOT DETECTED Final   Candida krusei NOT DETECTED NOT DETECTED Final   Candida parapsilosis NOT DETECTED NOT  DETECTED Final   Candida tropicalis NOT DETECTED NOT DETECTED Final   Cryptococcus neoformans/gattii NOT DETECTED NOT DETECTED Final   Meth resistant mecA/C and MREJ DETECTED (A) NOT DETECTED Final    Comment: CRITICAL RESULT CALLED TO, READ BACK BY AND VERIFIED WITH: VALERIE POWELL,RN 12/20/2020 AT 0522 A.HUGHES Performed at [facility], [place] [address], [place], [place] [address]   Aerobic/Anaerobic Culture (surgical/deep wound)     Status: None   Collection Time: 12/20/20 10:02 AM   Specimen: Abscess  Result Value Ref Range Status   Specimen Description   Final    ABSCESS LT SI JOINT ASPIRATION Performed at [facility] [address] [place], [place] [address]    Special Requests   Final    NONE Performed at [facility], [place] [address] [place], [place] [address]    Gram Stain NO WBC SEEN RARE GRAM POSITIVE COCCI   Final   Culture   Final    FEW METHICILLIN RESISTANT STAPHYLOCOCCUS AUREUS NO ANAEROBES ISOLATED Performed at [facility], [place] [address] [place], [place] [address]    Report Status 12/26/2020 FINAL  Final   Organism ID, Bacteria METHICILLIN RESISTANT STAPHYLOCOCCUS AUREUS  Final      Susceptibility   Methicillin resistant staphylococcus aureus - MIC*    CIPROFLOXACIN >=8 RESISTANT Resistant     ERYTHROMYCIN >=8 RESISTANT Resistant     GENTAMICIN <=0.5 SENSITIVE Sensitive     OXACILLIN >=4 RESISTANT Resistant     TETRACYCLINE <=1 SENSITIVE Sensitive     VANCOMYCIN <=0.5 SENSITIVE Sensitive     TRIMETH/SULFA <=10 SENSITIVE Sensitive     CLINDAMYCIN <=0.25 SENSITIVE Sensitive      RIFAMPIN <=0.5 SENSITIVE Sensitive     Inducible Clindamycin NEGATIVE Sensitive     * FEW METHICILLIN RESISTANT STAPHYLOCOCCUS AUREUS  Culture, blood (routine x 2)     Status: None   Collection Time: 12/21/20 12:09 PM   Specimen: BLOOD  Result Value Ref Range Status   Specimen Description   Final    BLOOD LEFT ANTECUBITAL Performed at [facility] [address] [place], [place] [address]    Special Requests   Final    BOTTLES DRAWN AEROBIC AND ANAEROBIC Blood Culture results may not be optimal due to an excessive volume of blood received in culture bottles   Culture   Final    NO GROWTH 5 DAYS Performed at [facility], [place] [address], [place], [place] [address]    Report Status 12/26/2020 FINAL  Final  Culture, blood (routine x 2)     Status: None   Collection Time: 12/21/20 12:20 PM   Specimen: BLOOD RIGHT HAND  Result Value Ref Range Status   Specimen Description   Final    BLOOD RIGHT HAND Performed at [facility] [address], [place], [place] [address]    Special Requests   Final    BOTTLES DRAWN AEROBIC AND ANAEROBIC Blood Culture adequate volume Performed at [facility] [address], [place],  [address]    Culture   Final    NO GROWTH 5 DAYS Performed at [facility], [place] [address], [place],  [address]    Report Status 12/26/2020 FINAL  Final  Culture, blood (routine x 2)     Status: Abnormal (Preliminary result)   Collection Time: 12/24/20 11:53 AM   Specimen: BLOOD LEFT HAND  Result Value Ref Range Status  Specimen Description   Final    BLOOD LEFT HAND Performed at [facility] [address], [place], [place] [address]    Special Requests   Final    BOTTLES DRAWN AEROBIC ONLY Blood Culture adequate volume Performed at [facility] [address] [place], [place] [address]    Culture  Setup Time   Final    AEROBIC BOTTLE  ONLY GRAM POSITIVE COCCI CRITICAL RESULT CALLED TO, READ BACK BY AND VERIFIED WITH: C. SHADE PHARMD, AT 0737 12/25/20 D. Victoriano Lain    Culture (A)  Final    STAPHYLOCOCCUS AUREUS SUSCEPTIBILITIES TO FOLLOW Performed at [facility], [place] [address] [place], [place] [address]    Report Status PENDING  Incomplete  Blood Culture ID Panel (Reflexed)     Status: Abnormal   Collection Time: 12/24/20 11:53 AM  Result Value Ref Range Status   Enterococcus faecalis NOT DETECTED NOT DETECTED Final   Enterococcus Faecium NOT DETECTED NOT DETECTED Final   Listeria monocytogenes NOT DETECTED NOT DETECTED Final   Staphylococcus species DETECTED (A) NOT DETECTED Final    Comment: CRITICAL RESULT CALLED TO, READ BACK BY AND VERIFIED WITH: C. SHADE PHARMD, AT 9485 12/25/20 D. VANHOOK    Staphylococcus aureus (BCID) DETECTED (A) NOT DETECTED Final    Comment: Methicillin (oxacillin)-resistant Staphylococcus aureus (MRSA). MRSA is predictably resistant to beta-lactam antibiotics (except ceftaroline). Preferred therapy is vancomycin unless clinically contraindicated. Patient requires contact precautions if  hospitalized. CRITICAL RESULT CALLED TO, READ BACK BY AND VERIFIED WITH: C. SHADE PHARMD, AT 4627 12/25/20 D. VANHOOK    Staphylococcus epidermidis NOT DETECTED NOT DETECTED Final   Staphylococcus lugdunensis NOT DETECTED NOT DETECTED Final   Streptococcus species NOT DETECTED NOT DETECTED Final   Streptococcus agalactiae NOT DETECTED NOT DETECTED Final   Streptococcus pneumoniae NOT DETECTED NOT DETECTED Final   Streptococcus pyogenes NOT DETECTED NOT DETECTED Final   A.calcoaceticus-baumannii NOT DETECTED NOT DETECTED Final   Bacteroides fragilis NOT DETECTED NOT DETECTED Final   Enterobacterales NOT DETECTED NOT DETECTED Final   Enterobacter cloacae complex NOT DETECTED NOT DETECTED Final   Escherichia coli NOT DETECTED NOT DETECTED Final   Klebsiella aerogenes NOT DETECTED NOT DETECTED Final    Klebsiella oxytoca NOT DETECTED NOT DETECTED Final   Klebsiella pneumoniae NOT DETECTED NOT DETECTED Final   Proteus species NOT DETECTED NOT DETECTED Final   Salmonella species NOT DETECTED NOT DETECTED Final   Serratia marcescens NOT DETECTED NOT DETECTED Final   Haemophilus influenzae NOT DETECTED NOT DETECTED Final   Neisseria meningitidis NOT DETECTED NOT DETECTED Final   Pseudomonas aeruginosa NOT DETECTED NOT DETECTED Final   Stenotrophomonas maltophilia NOT DETECTED NOT DETECTED Final   Candida albicans NOT DETECTED NOT DETECTED Final   Candida auris NOT DETECTED NOT DETECTED Final   Candida glabrata NOT DETECTED NOT DETECTED Final   Candida krusei NOT DETECTED NOT DETECTED Final   Candida parapsilosis NOT DETECTED NOT DETECTED Final   Candida tropicalis NOT DETECTED NOT DETECTED Final   Cryptococcus neoformans/gattii NOT DETECTED NOT DETECTED Final   Meth resistant mecA/C and MREJ DETECTED (A) NOT DETECTED Final    Comment: CRITICAL RESULT CALLED TO, READ BACK BY AND VERIFIED WITH: C. Roseanne Kaufman, AT 0350 12/25/20 [place] Performed at [facility], [place] [address] [place], [place] [address]          Radiology Studies: No results found.      Scheduled Meds: . Chlorhexidine Gluconate Cloth  6 each Topical Daily  . enoxaparin (LOVENOX) injection  50 mg Subcutaneous Q24H  . gabapentin  300 mg Oral QHS  . lidocaine  1 patch Transdermal Q24H  . mometasone-formoterol  2 puff Inhalation BID  . montelukast  10 mg Oral q morning  . pantoprazole  40 mg Oral Daily  . polyethylene glycol  17 g Oral Daily  . senna-docusate  1 tablet Oral BID   Continuous Infusions: . sodium chloride 20 mL/hr at 12/27/20 0600  . DAPTOmycin (CUBICIN)  IV Stopped (12/26/20 2056)  . methocarbamol (ROBAXIN) IV Stopped (12/27/20 0243)     LOS: 8 days    Time spent: 35 mins.More than 50% of that time was spent in counseling and/or coordination of care.      Shelly Coss,  MD [facility] P5/04/2021, 7:44 AM

## 2020-12-27 NOTE — Plan of Care (Signed)
**  2 phlebotomist tried to obtain [facility] on pt but at this time they were only able to obtain 1 culture (total of 4 attempts) MD aware-hydrate pt & try again   Patient had a home prescription of oxycodone in her room. I educated her on the hospital policy for her safety etc. I gave patient the option of storing in our pharmacy until d/c or daughter-in-law could take home.  Daughter-in-law agreed to take oxycodone bottle home for her & I personally viewed her leave with it.   Pt had CT of L hip this evening TEE done midday @ [facility]  Pain control seemed to be the main concern throughout the day however;she did seem more comfortable this afternoon/evening compared to am.  A/Ox4 RA  BP running high at times & MD Adhikari notified.  MD d/c contact precaution  SQ lovenox PICC line care Pain control Tele VS/lab monitoring

## 2020-12-27 NOTE — Progress Notes (Signed)
This patient is currently at [facility] for her procedure.   She has persistent bacteremia with MSSA and on cefazolin.   Has a left septic SI joint.   I will repeat the blood cultures.  If they are again positive, I would recommend removing the current picc line.   Thayer Headings, MD

## 2020-12-27 NOTE — Procedures (Signed)
     Transesophageal Echocardiogram Note  XENA PROPST [id number] 17-Jul-1961  Procedure: Transesophageal Echocardiogram Indications: MSSA bacteremia  Procedure Details Consent: Obtained Time Out: Verified patient identification, verified procedure, site/side was marked, verified correct patient position, special equipment/implants available, Radiology Safety Procedures followed,  medications/allergies/relevent history reviewed, required imaging and test results available.  Performed  Medications: Fentanyl: 144mg Versed: 936m Left Ventrical:  LVEF 60-65%  Mitral Valve: Mildly thickened and calcified. Trivial MR  Aortic Valve: Moderately thickened and calcified. Moderate aortic stenosis. Trivial AI  Tricuspid Valve: Normal structure. Trivial TR  Pulmonic Valve: Normal structure. Trivial PI  Left Atrium/ Left atrial appendage: No LAA thrombus  Atrial septum: No shunting seen by color doppler  Aorta: Grade II plaquing  No distinct vegetations visualized on preliminary review of TEE. Please see final report for complete findings.    Complications: No apparent complications Patient did tolerate procedure well.  HeFreada BergeronMD 12/27/2020, 2:48 PM

## 2020-12-28 MED ORDER — AMLODIPINE BESYLATE 5 MG PO TABS
5.0000 mg | ORAL_TABLET | Freq: Every day | ORAL | Status: DC
  Administered 2020-12-28 – 2020-12-30 (×3): 5 mg via ORAL
  Filled 2020-12-28 (×4): qty 1

## 2020-12-28 NOTE — Progress Notes (Signed)
Occupational Therapy Treatment and Discharge note: Patient Details Name: Nicole Hines MRN: [id number] DOB: 01-Jul-1961 Today's Date: 12/28/2020    History of present illness 60 year old female who presented to the emergency department at [facility] with complaint of worsening left hip pain, inability to ambulate due to pain.  In the emergency department she was found to have fever, lab work showed leukocytosis.   MRI revealed left septic sacroiliac joint. Dx of MRSA, SIRS, septic L SIJ. Pt with history of chronic knee pain, asthma   OT comments  Patient progressing and has met all OT goals with a "6-Clicks" AM-PAC score today of 24/24 indicating 0% ADL impairment,  compared to previous session. Patient remains limited by LT hip pain along with deficits noted below, and OT will defer to physical therapy to address continued ambulation training. Pt continues to demonstrate good rehab potential and continued skilled OT to increase safety and independence and support pt's plan for return to work is recommended.   Acute OT to sign off, as Home health OT can now address remaining deficits related to IADLs and return to work.    Follow Up Recommendations  Home health OT    Equipment Recommendations  3 in 1 bedside commode ([facility])    Recommendations for Other Services      Precautions / Restrictions Precautions Precautions: Fall Restrictions Weight Bearing Restrictions: No       Mobility Bed Mobility           Sit to supine: Modified independent (Device/Increase time)   General bed mobility comments: Pt mobilizing on and off bed as needed for ADLs and bathing.    Transfers   Equipment used: Rolling walker (2 wheeled)   Sit to Stand: Modified independent (Device/Increase time) Stand pivot transfers: Modified independent (Device/Increase time)            Balance   Sitting-balance support: No upper extremity supported;Feet unsupported Sitting  balance-Leahy Scale: Good       Standing balance-Leahy Scale: Fair Standing balance comment: Pt able to stand without UE support and complete ADLs.  No LOB. Rated fair due to pain.                           ADL either performed or assessed with clinical judgement   ADL Overall ADL's : Modified independent;At baseline Eating/Feeding: Independent   Grooming: Sitting;Modified independent;Wash/dry hands;Wash/dry face   Upper Body Bathing: Modified independent;Standing   Lower Body Bathing: Modified independent;Sit to/from stand Lower Body Bathing Details (indicate cue type and reason): In standing, pt able to lean down and bath lower legs, picked trash up off the floor and threw away. Upper Body Dressing : Modified independent   Lower Body Dressing: Modified independent;Sitting/lateral leans;Sit to/from stand Lower Body Dressing Details (indicate cue type and reason): Increased effort to doff and don socks at EOB, but able to compensate by lifting each LE onto EOB for support. Toilet Transfer: BSC;Modified Social research officer, government Details (indicate cue type and reason): Pt pivoting EOB<>BSC mod I without AD. Toileting- Clothing Manipulation and Hygiene: Modified independent;Sitting/lateral lean;Sit to/from stand Toileting - Clothing Manipulation Details (indicate cue type and reason): Pt stood and completed full sponge bath and performed peri hygiene all Mod I while standing.     Functional mobility during ADLs: Modified independent General ADL Comments: Pt using RW as needed, but often standing for ADLs without any UE support.  Moans in pain with ambulation in  room but balance appears steady.     Vision Baseline Vision/History: Wears glasses Wears Glasses: At all times Vision Assessment?: No apparent visual deficits   Perception     Praxis      Cognition Arousal/Alertness: Awake/alert Behavior During Therapy: WFL for tasks assessed/performed Overall  Cognitive Status: Within Functional Limits for tasks assessed                                          Exercises     Shoulder Instructions       General Comments      Pertinent Vitals/ Pain       Pain Score: 8  Pain Location: L SI joint  (lesser so to R hip which also has acute pain). Pain also under breasts with skin breakdown noted. RN notified. 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 OT Goal Formulation: With patient Time For Goal Achievement: 01/05/21 Potential to Achieve Goals: Good  Plan Discharge plan remains appropriate    Co-evaluation                 AM-PAC OT "6 Clicks" Daily Activity     Outcome Measure   Help from another person eating meals?: None Help from another person taking care of personal grooming?: None Help from another person toileting, which includes using toliet, bedpan, or urinal?: None Help from another person bathing (including washing, rinsing, drying)?: None Help from another person to put on and taking off regular upper body clothing?: None Help from another person to put on and taking off regular lower body clothing?: None 6 Click Score: 24    End of Session Equipment Utilized During Treatment: Rolling walker  OT Visit Diagnosis: Other abnormalities of gait and mobility (R26.89);Pain Pain - Right/Left:  Left Pain - part of body: Hip;Knee   Activity Tolerance Patient limited by pain   Patient Left in bed;with call bell/phone within reach;with nursing/sitter in room   Nurse Communication Mobility status (OT signing off. Pt request for breast cream.)        Time: 0383-3383 OT Time Calculation (min): 19 min  Charges: OT General Charges $OT Visit: 1 Visit OT Treatments $Self Care/Home Management : 8-22 mins  Anderson Malta, OT Acute Rehab Services Office: [phone] 12/28/2020   Julien Girt 12/28/2020, 12:31 PM

## 2020-12-28 NOTE — Progress Notes (Signed)
PROGRESS NOTE    Nicole Hines  UJW:[id number] DOB: 30-Apr-1961 DOA: 12/18/2020 PCP: [name], MD   Chief Complain: Left hip pain  Brief Narrative: Patient is a 60 year old female with history of chronic knee pain, asthma who presented to the emergency department at [facility] with complaint of worsening left hip pain, inability to ambulate due to pain.  In the emergency department she was found to have fever, lab work showed leukocytosis.  MRI revealed left septic sacroiliac joint.  Orthopedics, IR , ID consulted.  Underwent  CT-guided aspiration of left on 12/20/20.  Blood culture showed MRSA,on daptomycin.Underwent  TTE on on 12/28/2018 without finding of any vegetations.  ID following.  Hospital course remarkable for persistent left hip pain.Follow-up CT hip showed  6.0 x 3.1 x 1.6 cm area , concerning for developing phlegmon in the  left iliac muscle secondary to an adjacent left sacroiliac joint infection.  Orthopedic/IR consulted today.    Assessment & Plan:   Principal Problem:   MRSA (methicillin resistant Staphylococcus aureus) carrier Active Problems:   SIRS (systemic inflammatory response syndrome) (HCC)   Left hip pain   Septic joint (HCC)   Chronic SI joint pain   Fever   Acute bacterial endocarditis   Left septic sacroiliac joint: Presented with fever, left hip pain.  MRI of the lumbar spine showed left septic sacroiliac joint.  IR, orthopedics, ID consulted on admission. Underwent  CT-guided aspiration of left hip on 12/20/20. Final report of  joint fluid culture showed MRSA.  Patient on daptomycin Hospital course remarkable for persistent left hip pain.Follow-up CT hip showed  6.0 x 3.1 x 1.6 cm area ,concerning for developing phlegmon in the  left iliac muscle secondary to an adjacent left sacroiliac joint infection.  Orthopedic/IR consulted today for  drainage.   MRSA bacteremia: Presented with fever, leukocytosis.  Source is left hip.  Vancomycin,changed to  Dapto.Elevated CRP She has a pansystolic murmur.  TTE showed normal left ventricle ejection fraction, no valvular vegetations.  TEE did not show any vegetation.  Repeat blood culture sent on 12/21/2020, no growth till date.  But blood culture sent on 12/24/2020 again showed MRSA.  We had to put a PICC line due to difficult venous access on 12/24/2020.  Since blood culture sent on 12/24/2020 showed MRSA, we sent repeat blood culture on 12/27/2020.  If that blood culture again shows MRSA, we are planning to remove PICC line.  ID following  Hypertension: Patient's blood pressure is fluctuating.  Will start on amlodipine 5 mg daily.  Hypokalemia:Supplemented and corrected  Chronic pain syndrome: Continue supportive care, pain medications.  Patient takes high-dose oxycodone at home on scheduled basis.  History of asthma: Currently saturating fine on room air.  No wheezing  Debility/deconditioning/left hip pain:  PT/OT recommended HH on discharge.  Morbid obesity: BMI of 36.6           DVT prophylaxis:lovenox Code Status: Full Family Communication: Called and discussed with daughter on phone on 12/28/20 Status is: Inpatient  Remains inpatient appropriate because:Inpatient level of care appropriate due to severity of illness   Dispo: The patient is from: Home              Anticipated d/c is to: Home              Patient currently is not medically stable to d/c.   Difficult to place patient No      Consultants: ID, orthopedics, IR  Procedures:  Antimicrobials:  Anti-infectives (From admission, onward)   Start     Dose/Rate Route Frequency Ordered Stop   12/24/20 1000  DAPTOmycin (CUBICIN) 800 mg in sodium chloride 0.9 % IVPB        10 mg/kg  79.4 kg (Adjusted) 132 mL/hr over 30 Minutes Intravenous Daily 12/24/20 0805     12/20/20 1900  vancomycin (VANCOREADY) IVPB 1500 mg/300 mL  Status:  Discontinued        1,500 mg 150 mL/hr over 120 Minutes Intravenous Every 12 hours 12/20/20 0613  12/24/20 0758   12/20/20 0645  vancomycin (VANCOREADY) IVPB 2000 mg/400 mL        2,000 mg 200 mL/hr over 120 Minutes Intravenous  Once 12/20/20 0554 12/20/20 0834      Subjective:  Patient seen and examined the bedside this morning.  Mildly hypertensive this morning.  She appears little comfortable today.  Left hip pain is little better.  We discussed about the finding of new CT hip.  Objective: Vitals:   12/27/20 2049 12/28/20 0054 12/28/20 0457 12/28/20 0533  BP: 113/72 (!) 121/58 (!) 170/82 (!) 162/93  Pulse: 91 77 83 78  Resp: (!) 25 (!) 24 15   Temp: 99 F (37.2 C) 98 F (36.7 C) 99.4 F (37.4 C)   TempSrc:      SpO2: 97% 96% 98%   Weight:      Height:        Intake/Output Summary (Last 24 hours) at 12/28/2020 6269 Last data filed at 12/28/2020 0200 Gross per 24 hour  Intake 200 ml  Output --  Net 200 ml   Filed Weights   12/19/20 0236 12/26/20 0500 12/27/20 1321  Weight: 106.2 kg 111.9 kg 111.9 kg    Examination:  General exam: Overall comfortable, not in distress, obese HEENT: PERRL Respiratory system:  no wheezes or crackles  Cardiovascular system: S1 & S2 heard, RRR.  Gastrointestinal system: Abdomen is nondistended, soft and nontender. Central nervous system: Alert and oriented Extremities: No edema, no clubbing ,no cyanosis, tenderness on the left buttock, PICC line on the right arm Skin: No rashes, no ulcers,no icterus   Data Reviewed: I have personally reviewed following labs and imaging studies  CBC: Recent Labs  Lab 12/23/20 0452 12/24/20 0401 12/25/20 0346 12/26/20 0305  WBC 16.6* 18.2* 16.3* 14.6*  NEUTROABS 12.9* 13.4* 11.9* 10.8*  HGB 11.6* 11.3* 10.6* 10.0*  HCT 35.4* 34.9* 33.6* 31.6*  MCV 91.9 93.6 95.2 94.3  PLT 261 309 363 485   Basic Metabolic Panel: Recent Labs  Lab 12/23/20 0452 12/24/20 0401 12/25/20 0346 12/26/20 0305  NA 137 137 144 141  K 3.2* 3.2* 3.1* 3.8  CL 100 98 103 101  CO2 27 27 30  32  GLUCOSE 119* 121*  103* 105*  BUN 13 11 13 10   CREATININE 0.41* 0.56 0.63 0.57  CALCIUM 9.3 9.2 9.4 8.9   GFR: Estimated Creatinine Clearance: 99.3 mL/min (by C-G formula based on SCr of 0.57 mg/dL). Liver Function Tests: No results for input(s): AST, ALT, ALKPHOS, BILITOT, PROT, ALBUMIN in the last 168 hours. No results for input(s): LIPASE, AMYLASE in the last 168 hours. No results for input(s): AMMONIA in the last 168 hours. Coagulation Profile: No results for input(s): INR, PROTIME in the last 168 hours. Cardiac Enzymes: Recent Labs  Lab 12/25/20 0346  CKTOTAL 17*   BNP (last 3 results) No results for input(s): PROBNP in the last 8760 hours. HbA1C: No results for input(s): HGBA1C in the last 72 hours.  CBG: No results for input(s): GLUCAP in the last 168 hours. Lipid Profile: No results for input(s): CHOL, HDL, LDLCALC, TRIG, CHOLHDL, LDLDIRECT in the last 72 hours. Thyroid Function Tests: No results for input(s): TSH, T4TOTAL, FREET4, T3FREE, THYROIDAB in the last 72 hours. Anemia Panel: No results for input(s): VITAMINB12, FOLATE, FERRITIN, TIBC, IRON, RETICCTPCT in the last 72 hours. Sepsis Labs: No results for input(s): PROCALCITON, LATICACIDVEN in the last 168 hours.  Recent Results (from the past 240 hour(s))  Resp Panel by RT-PCR (Flu A&B, Covid) Nasopharyngeal Swab     Status: None   Collection Time: 12/18/20  9:10 PM   Specimen: Nasopharyngeal Swab; Nasopharyngeal(NP) swabs in vial transport medium  Result Value Ref Range Status   SARS Coronavirus 2 by RT PCR NEGATIVE NEGATIVE Final    Comment: (NOTE) SARS-CoV-2 target nucleic acids are NOT DETECTED.  The SARS-CoV-2 RNA is generally detectable in upper respiratory specimens during the acute phase of infection. The lowest concentration of SARS-CoV-2 viral copies this assay can detect is 138 copies/mL. A negative result does not preclude SARS-Cov-2 infection and should not be used as the sole basis for treatment or other  patient management decisions. A negative result may occur with  improper specimen collection/handling, submission of specimen other than nasopharyngeal swab, presence of viral mutation(s) within the areas targeted by this assay, and inadequate number of viral copies(<138 copies/mL). A negative result must be combined with clinical observations, patient history, and epidemiological information. The expected result is Negative.  Fact Sheet for Patients:  EntrepreneurPulse.com.au  Fact Sheet for Healthcare Providers:  IncredibleEmployment.be  This test is no t yet approved or cleared by the [place] FDA and  has been authorized for detection and/or diagnosis of SARS-CoV-2 by FDA under an Emergency Use Authorization (EUA). This EUA will remain  in effect (meaning this test can be used) for the duration of the COVID-19 declaration under Section 564(b)(1) of the Act, 21 U.S.C.section 360bbb-3(b)(1), unless the authorization is terminated  or revoked sooner.       Influenza A by PCR NEGATIVE NEGATIVE Final   Influenza B by PCR NEGATIVE NEGATIVE Final    Comment: (NOTE) The Xpert Xpress SARS-CoV-2/FLU/RSV plus assay is intended as an aid in the diagnosis of influenza from Nasopharyngeal swab specimens and should not be used as a sole basis for treatment. Nasal washings and aspirates are unacceptable for Xpert Xpress SARS-CoV-2/FLU/RSV testing.  Fact Sheet for Patients: EntrepreneurPulse.com.au  Fact Sheet for Healthcare Providers: IncredibleEmployment.be  This test is not yet approved or cleared by the [place] FDA and has been authorized for detection and/or diagnosis of SARS-CoV-2 by FDA under an Emergency Use Authorization (EUA). This EUA will remain in effect (meaning this test can be used) for the duration of the COVID-19 declaration under Section 564(b)(1) of the Act, 21 U.S.C. section  360bbb-3(b)(1), unless the authorization is terminated or revoked.  Performed at [facility], [address] [place], [place] [address]   Culture, blood (routine x 2)     Status: Abnormal   Collection Time: 12/18/20 10:00 PM   Specimen: BLOOD  Result Value Ref Range Status   Specimen Description   Final    BLOOD RIGHT HAND Performed at [facility], [address] [place], [place] [address]    Special Requests   Final    BOTTLES DRAWN AEROBIC AND ANAEROBIC Blood Culture adequate volume Performed at [facility], [address] [place],  [address]    Culture  Setup Time   Final    GRAM POSITIVE COCCI IN BOTH AEROBIC AND ANAEROBIC BOTTLES CRITICAL RESULT CALLED TO, READ BACK BY AND VERIFIED WITH: VALERIE POWELL,RN 12/20/2020 AT 0522 A.HUGHES    Culture (A)  Final    STAPHYLOCOCCUS AUREUS SUSCEPTIBILITIES PERFORMED ON PREVIOUS CULTURE WITHIN THE LAST 5 DAYS. Performed at [facility], [place] [address], [place], [place] [address]    Report Status 12/22/2020 FINAL  Final  Culture, blood (routine x 2)     Status: Abnormal   Collection Time: 12/18/20 10:00 PM   Specimen: BLOOD  Result Value Ref Range Status   Specimen Description   Final    BLOOD LEFT HAND Performed at [facility], [address] [place], [place] [address]    Special Requests   Final    BOTTLES DRAWN AEROBIC AND ANAEROBIC Blood Culture adequate volume Performed at [facility], [address], [place], [place] [address]    Culture  Setup Time   Final    GRAM POSITIVE COCCI IN BOTH AEROBIC AND ANAEROBIC BOTTLES CRITICAL RESULT CALLED TO, READ BACK BY AND VERIFIED WITH: VALERIE POWELL,RN 12/20/2020 AT [place] A.HUGHES Performed at [facility], [place] [address], [place],  [address]    Culture METHICILLIN RESISTANT STAPHYLOCOCCUS AUREUS (A)  Final   Report Status 12/22/2020 FINAL  Final    Organism ID, Bacteria METHICILLIN RESISTANT STAPHYLOCOCCUS AUREUS  Final      Susceptibility   Methicillin resistant staphylococcus aureus - MIC*    CIPROFLOXACIN >=8 RESISTANT Resistant     ERYTHROMYCIN >=8 RESISTANT Resistant     GENTAMICIN <=0.5 SENSITIVE Sensitive     OXACILLIN >=4 RESISTANT Resistant     TETRACYCLINE <=1 SENSITIVE Sensitive     VANCOMYCIN <=0.5 SENSITIVE Sensitive     TRIMETH/SULFA <=10 SENSITIVE Sensitive     CLINDAMYCIN <=0.25 SENSITIVE Sensitive     RIFAMPIN <=0.5 SENSITIVE Sensitive     Inducible Clindamycin NEGATIVE Sensitive     * METHICILLIN RESISTANT STAPHYLOCOCCUS AUREUS  Blood Culture ID Panel (Reflexed)     Status: Abnormal   Collection Time: 12/18/20 10:00 PM  Result Value Ref Range Status   Enterococcus faecalis NOT DETECTED NOT DETECTED Final   Enterococcus Faecium NOT DETECTED NOT DETECTED Final   Listeria monocytogenes NOT DETECTED NOT DETECTED Final   Staphylococcus species DETECTED (A) NOT DETECTED Final    Comment: CRITICAL RESULT CALLED TO, READ BACK BY AND VERIFIED WITH: VALERIE POWELL,RN 12/20/2020 AT 0522 A.HUGHES    Staphylococcus aureus (BCID) DETECTED (A) NOT DETECTED Final    Comment: Methicillin (oxacillin)-resistant Staphylococcus aureus (MRSA). MRSA is predictably resistant to beta-lactam antibiotics (except ceftaroline). Preferred therapy is vancomycin unless clinically contraindicated. Patient requires contact precautions if  hospitalized. CRITICAL RESULT CALLED TO, READ BACK BY AND VERIFIED WITH: VALERIE POWELL,RN 12/20/2020 AT 0522 A.HUGHES    Staphylococcus epidermidis NOT DETECTED NOT DETECTED Final   Staphylococcus lugdunensis NOT DETECTED NOT DETECTED Final   Streptococcus species NOT DETECTED NOT DETECTED Final   Streptococcus agalactiae NOT DETECTED NOT DETECTED Final   Streptococcus pneumoniae NOT DETECTED NOT DETECTED Final   Streptococcus pyogenes NOT DETECTED NOT DETECTED Final   A.calcoaceticus-baumannii NOT  DETECTED NOT DETECTED Final   Bacteroides fragilis NOT DETECTED NOT DETECTED Final   Enterobacterales NOT DETECTED NOT DETECTED Final   Enterobacter cloacae complex NOT DETECTED NOT DETECTED Final   Escherichia coli NOT DETECTED NOT DETECTED Final   Klebsiella aerogenes NOT DETECTED NOT DETECTED Final   Klebsiella oxytoca  NOT DETECTED NOT DETECTED Final   Klebsiella pneumoniae NOT DETECTED NOT DETECTED Final   Proteus species NOT DETECTED NOT DETECTED Final   Salmonella species NOT DETECTED NOT DETECTED Final   Serratia marcescens NOT DETECTED NOT DETECTED Final   Haemophilus influenzae NOT DETECTED NOT DETECTED Final   Neisseria meningitidis NOT DETECTED NOT DETECTED Final   Pseudomonas aeruginosa NOT DETECTED NOT DETECTED Final   Stenotrophomonas maltophilia NOT DETECTED NOT DETECTED Final   Candida albicans NOT DETECTED NOT DETECTED Final   Candida auris NOT DETECTED NOT DETECTED Final   Candida glabrata NOT DETECTED NOT DETECTED Final   Candida krusei NOT DETECTED NOT DETECTED Final   Candida parapsilosis NOT DETECTED NOT DETECTED Final   Candida tropicalis NOT DETECTED NOT DETECTED Final   Cryptococcus neoformans/gattii NOT DETECTED NOT DETECTED Final   Meth resistant mecA/C and MREJ DETECTED (A) NOT DETECTED Final    Comment: CRITICAL RESULT CALLED TO, READ BACK BY AND VERIFIED WITH: VALERIE POWELL,RN 12/20/2020 AT 0522 A.HUGHES Performed at [facility], [place] [address], [place], [place] [address]   Aerobic/Anaerobic Culture (surgical/deep wound)     Status: None   Collection Time: 12/20/20 10:02 AM   Specimen: Abscess  Result Value Ref Range Status   Specimen Description   Final    ABSCESS LT SI JOINT ASPIRATION Performed at [facility] [address], [place], [place] [address]    Special Requests   Final    NONE Performed at [facility], [place] [address] [place], [place] [address]    Gram Stain NO WBC SEEN RARE GRAM  POSITIVE COCCI   Final   Culture   Final    FEW METHICILLIN RESISTANT STAPHYLOCOCCUS AUREUS NO ANAEROBES ISOLATED Performed at [facility], [place] [address] [place], [place] [address]    Report Status 12/26/2020 FINAL  Final   Organism ID, Bacteria METHICILLIN RESISTANT STAPHYLOCOCCUS AUREUS  Final      Susceptibility   Methicillin resistant staphylococcus aureus - MIC*    CIPROFLOXACIN >=8 RESISTANT Resistant     ERYTHROMYCIN >=8 RESISTANT Resistant     GENTAMICIN <=0.5 SENSITIVE Sensitive     OXACILLIN >=4 RESISTANT Resistant     TETRACYCLINE <=1 SENSITIVE Sensitive     VANCOMYCIN <=0.5 SENSITIVE Sensitive     TRIMETH/SULFA <=10 SENSITIVE Sensitive     CLINDAMYCIN <=0.25 SENSITIVE Sensitive     RIFAMPIN <=0.5 SENSITIVE Sensitive     Inducible Clindamycin NEGATIVE Sensitive     * FEW METHICILLIN RESISTANT STAPHYLOCOCCUS AUREUS  Culture, blood (routine x 2)     Status: None   Collection Time: 12/21/20 12:09 PM   Specimen: BLOOD  Result Value Ref Range Status   Specimen Description   Final    BLOOD LEFT ANTECUBITAL Performed at [facility] [address] [place], [place] [address]    Special Requests   Final    BOTTLES DRAWN AEROBIC AND ANAEROBIC Blood Culture results may not be optimal due to an excessive volume of blood received in culture bottles   Culture   Final    NO GROWTH 5 DAYS Performed at [facility], [place] [address] [place], [place] [address]    Report Status 12/26/2020 FINAL  Final  Culture, blood (routine x 2)     Status: None   Collection Time: 12/21/20 12:20 PM   Specimen: BLOOD RIGHT HAND  Result Value Ref Range Status   Specimen Description   Final  BLOOD RIGHT HAND Performed at [facility], [place] [address] [place], [place] [address]    Special Requests   Final    BOTTLES DRAWN AEROBIC AND ANAEROBIC Blood Culture adequate volume Performed at [facility] [address],  [place], [place] [address]    Culture   Final    NO GROWTH 5 DAYS Performed at [facility], [place] [address], [place], [place] [address]    Report Status 12/26/2020 FINAL  Final  Culture, blood (routine x 2)     Status: Abnormal   Collection Time: 12/24/20 11:53 AM   Specimen: BLOOD LEFT HAND  Result Value Ref Range Status   Specimen Description   Final    BLOOD LEFT HAND Performed at [facility] [address], [place], [place] [address]    Special Requests   Final    BOTTLES DRAWN AEROBIC ONLY Blood Culture adequate volume Performed at [facility] [address], [place], [place] [address]    Culture  Setup Time   Final    AEROBIC BOTTLE ONLY GRAM POSITIVE COCCI CRITICAL RESULT CALLED TO, READ BACK BY AND VERIFIED WITH: C. Roseanne Kaufman, AT 0175 12/25/20 [place] Performed at [facility], [place] [address], [place], [place] [address]    Culture STAPHYLOCOCCUS AUREUS (A)  Final   Report Status 12/27/2020 FINAL  Final   Organism ID, Bacteria STAPHYLOCOCCUS AUREUS  Final      Susceptibility   Staphylococcus aureus - MIC*    CIPROFLOXACIN >=8 RESISTANT Resistant     ERYTHROMYCIN >=8 RESISTANT Resistant     GENTAMICIN <=0.5 SENSITIVE Sensitive     OXACILLIN >=4 RESISTANT Resistant     TETRACYCLINE <=1 SENSITIVE Sensitive     VANCOMYCIN <=0.5 SENSITIVE Sensitive     TRIMETH/SULFA <=10 SENSITIVE Sensitive     CLINDAMYCIN <=0.25 SENSITIVE Sensitive     RIFAMPIN <=0.5 SENSITIVE Sensitive     Inducible Clindamycin NEGATIVE Sensitive     * STAPHYLOCOCCUS AUREUS  Blood Culture ID Panel (Reflexed)     Status: Abnormal   Collection Time: 12/24/20 11:53 AM  Result Value Ref Range Status   Enterococcus faecalis NOT DETECTED NOT DETECTED Final   Enterococcus Faecium NOT DETECTED NOT DETECTED Final   Listeria monocytogenes NOT DETECTED NOT DETECTED Final   Staphylococcus species DETECTED (A) NOT DETECTED Final    Comment: CRITICAL RESULT  CALLED TO, READ BACK BY AND VERIFIED WITH: C. SHADE PHARMD, AT 5277 12/25/20 D. VANHOOK    Staphylococcus aureus (BCID) DETECTED (A) NOT DETECTED Final    Comment: Methicillin (oxacillin)-resistant Staphylococcus aureus (MRSA). MRSA is predictably resistant to beta-lactam antibiotics (except ceftaroline). Preferred therapy is vancomycin unless clinically contraindicated. Patient requires contact precautions if  hospitalized. CRITICAL RESULT CALLED TO, READ BACK BY AND VERIFIED WITH: C. SHADE PHARMD, AT 8242 12/25/20 D. VANHOOK    Staphylococcus epidermidis NOT DETECTED NOT DETECTED Final   Staphylococcus lugdunensis NOT DETECTED NOT DETECTED Final   Streptococcus species NOT DETECTED NOT DETECTED Final   Streptococcus agalactiae NOT DETECTED NOT DETECTED Final   Streptococcus pneumoniae NOT DETECTED NOT DETECTED Final   Streptococcus pyogenes NOT DETECTED NOT DETECTED Final   A.calcoaceticus-baumannii NOT DETECTED NOT DETECTED Final   Bacteroides fragilis NOT DETECTED NOT DETECTED Final   Enterobacterales NOT DETECTED NOT DETECTED Final   Enterobacter cloacae complex NOT DETECTED NOT DETECTED Final   Escherichia coli NOT DETECTED NOT DETECTED Final   Klebsiella aerogenes NOT DETECTED NOT  DETECTED Final   Klebsiella oxytoca NOT DETECTED NOT DETECTED Final   Klebsiella pneumoniae NOT DETECTED NOT DETECTED Final   Proteus species NOT DETECTED NOT DETECTED Final   Salmonella species NOT DETECTED NOT DETECTED Final   Serratia marcescens NOT DETECTED NOT DETECTED Final   Haemophilus influenzae NOT DETECTED NOT DETECTED Final   Neisseria meningitidis NOT DETECTED NOT DETECTED Final   Pseudomonas aeruginosa NOT DETECTED NOT DETECTED Final   Stenotrophomonas maltophilia NOT DETECTED NOT DETECTED Final   Candida albicans NOT DETECTED NOT DETECTED Final   Candida auris NOT DETECTED NOT DETECTED Final   Candida glabrata NOT DETECTED NOT DETECTED Final   Candida krusei NOT DETECTED NOT DETECTED Final    Candida parapsilosis NOT DETECTED NOT DETECTED Final   Candida tropicalis NOT DETECTED NOT DETECTED Final   Cryptococcus neoformans/gattii NOT DETECTED NOT DETECTED Final   Meth resistant mecA/C and MREJ DETECTED (A) NOT DETECTED Final    Comment: CRITICAL RESULT CALLED TO, READ BACK BY AND VERIFIED WITH: C. Roseanne Kaufman, AT [phone] 12/25/20 [place] Performed at [facility], [place] [address] [place], [place] [address]          Radiology Studies: ECHO TEE  Result Date: 12/27/2020    TRANSESOPHOGEAL ECHO REPORT   Patient Name:   CHANDELLE HARKEY [facility] Date of Exam: 12/27/2020 Medical Rec #:  [id number]        Height:       68.0 in Accession #:    [id number]       Weight:       246.7 lb Date of Birth:  Mar 11, 1961         BSA:          2.234 m Patient Age:    12 years         BP:           152/66 mmHg Patient Gender: F                HR:           100 bpm. Exam Location:  Inpatient Procedure: Transesophageal Echo, Cardiac Doppler and Color Doppler Indications:     Bacteremia  History:         Patient has prior history of Echocardiogram examinations, most                  recent 12/22/2020. Signs/Symptoms:Fever and Bacteremia; Risk                  Factors:Obesity. MRSA.  Sonographer:     Dustin Flock Referring Phys:  [id number] Leanor Kail Diagnosing Phys: Gwyndolyn Kaufman MD PROCEDURE: The transesophogeal probe was passed without difficulty through the esophogus of the patient. Sedation performed by performing physician. Patients was under conscious sedation during this procedure. Anesthetic administered: 148mg of Fentanyl,  9.051mof Versed. The patient developed no complications during the procedure. IMPRESSIONS  1. The aortic valve is tricuspid with moderate leaflet thickening and calcification. There is also nodular commisural calcification which is highly echogenic. This is most consistent with calcification with low suspicion for valvular vegetations. There is moderate aortic stenosis with  AVA 1.1cm2, mean gradient 3472m, peak gradient 44m267m Vmax 3.67m/s28mI 0.3. Trivial aortic regurgitation.  2. Left ventricular ejection fraction, by estimation, is 60 to 65%. The left ventricle has normal function.  3. Right ventricular systolic function is normal. The right ventricular size is normal.  4. No left atrial/left atrial appendage thrombus was detected.  5. The  mitral valve is normal in structure. Trivial mitral valve regurgitation. No evidence of mitral stenosis.  6. There is mild (Grade II) plaque.  7. No distinct valvular vegetations visualized on TEE. FINDINGS  Left Ventricle: Left ventricular ejection fraction, by estimation, is 60 to 65%. The left ventricle has normal function. The left ventricular internal cavity size was normal in size. Right Ventricle: The right ventricular size is normal. No increase in right ventricular wall thickness. Right ventricular systolic function is normal. Left Atrium: Left atrial size was normal in size. No left atrial/left atrial appendage thrombus was detected. Right Atrium: Right atrial size was normal in size. Pericardium: There is no evidence of pericardial effusion. Mitral Valve: The mitral valve is normal in structure. There is mild thickening of the mitral valve leaflet(s). There is mild calcification of the mitral valve leaflet(s). Mild to moderate mitral annular calcification. Trivial mitral valve regurgitation.  No evidence of mitral valve stenosis. Tricuspid Valve: The tricuspid valve is normal in structure. Tricuspid valve regurgitation is trivial. Aortic Valve: The aortic valve is tricuspid with moderate leaflet thickening and calcification. There is also nodular, commisural calcification noted which is very echogenic. This is most consistent with calcification with low suspicion for valvular vegetations. There is moderate aortic stenosis with AVA 1.1cm2, mean gradient 41mHg, peak gradient 651mg, Vmax 3.23m523m DI 0.3. Trivial aortic regurgitation.  Pulmonic Valve: The pulmonic valve was normal in structure. Pulmonic valve regurgitation is trivial. Aorta: The aortic root and ascending aorta are structurally normal, with no evidence of dilitation. There is mild (Grade II) plaque. IAS/Shunts: No atrial level shunt detected by color flow Doppler.  AORTIC VALVE AV Vmax:           387.00 cm/s AV Vmean:          274.000 cm/s AV VTI:            0.751 m AV Peak Grad:      59.9 mmHg AV Mean Grad:      34.0 mmHg LVOT Vmax:         116.00 cm/s LVOT Vmean:        79.300 cm/s LVOT VTI:          0.255 m LVOT/AV VTI ratio: 0.34  SHUNTS Systemic VTI: 0.26 m HeaGwyndolyn Kaufman Electronically signed by HeaGwyndolyn Kaufman Signature Date/Time: 12/27/2020/4:04:25 PM    Final         Scheduled Meds: . Chlorhexidine Gluconate Cloth  6 each Topical Daily  . enoxaparin (LOVENOX) injection  50 mg Subcutaneous Q24H  . gabapentin  300 mg Oral QHS  . lidocaine  1 patch Transdermal Q24H  . mometasone-formoterol  2 puff Inhalation BID  . montelukast  10 mg Oral q morning  . pantoprazole  40 mg Oral Daily  . polyethylene glycol  17 g Oral Daily  . senna-docusate  1 tablet Oral BID   Continuous Infusions: . DAPTOmycin (CUBICIN)  IV 800 mg (12/27/20 2111)  . methocarbamol (ROBAXIN) IV Stopped (12/27/20 0243)     LOS: 9 days    Time spent: 35 mins.More than 50% of that time was spent in counseling and/or coordination of care.      AmrShelly CossD [facility] P5/05/2021, 8:08 AM

## 2020-12-28 NOTE — Progress Notes (Signed)
[facility] for Infectious Disease   Reason for visit: Follow up on MRSA bacteremia  Interval History: CT of hip c/w phlegmon and she continues to have significant pain.  Afebrile.  No associated rash or diarrhea.   Day 10 total antibiotics Day 5 daptomycin  Physical Exam: Constitutional:  Vitals:   12/28/20 1026 12/28/20 1337  BP: 131/82 140/77  Pulse: 97 73  Resp: 20 20  Temp: 99.3 F (37.4 C) 98.1 F (36.7 C)  SpO2: 95% 100%   patient appears in NAD Respiratory: Normal respiratory effort; CTA B Cardiovascular: RRR GI: soft, nt, nd  Review of Systems: Constitutional: negative for fevers and chills Gastrointestinal: negative for nausea and diarrhea Integument/breast: negative for rash  Lab Results  Component Value Date   WBC 14.6 (H) 12/26/2020   HGB 10.0 (L) 12/26/2020   HCT 31.6 (L) 12/26/2020   MCV 94.3 12/26/2020   PLT 373 12/26/2020    Lab Results  Component Value Date   CREATININE 0.57 12/26/2020   BUN 10 12/26/2020   NA 141 12/26/2020   K 3.8 12/26/2020   CL 101 12/26/2020   CO2 32 12/26/2020    Lab Results  Component Value Date   ALT 25 12/19/2020   AST 25 12/19/2020   ALKPHOS 69 12/19/2020     Microbiology: Recent Results (from the past 240 hour(s))  Resp Panel by RT-PCR (Flu A&B, Covid) Nasopharyngeal Swab     Status: None   Collection Time: 12/18/20  9:10 PM   Specimen: Nasopharyngeal Swab; Nasopharyngeal(NP) swabs in vial transport medium  Result Value Ref Range Status   SARS Coronavirus 2 by RT PCR NEGATIVE NEGATIVE Final    Comment: (NOTE) SARS-CoV-2 target nucleic acids are NOT DETECTED.  The SARS-CoV-2 RNA is generally detectable in upper respiratory specimens during the acute phase of infection. The lowest concentration of SARS-CoV-2 viral copies this assay can detect is 138 copies/mL. A negative result does not preclude SARS-Cov-2 infection and should not be used as the sole basis for treatment or other patient management  decisions. A negative result may occur with  improper specimen collection/handling, submission of specimen other than nasopharyngeal swab, presence of viral mutation(s) within the areas targeted by this assay, and inadequate number of viral copies(<138 copies/mL). A negative result must be combined with clinical observations, patient history, and epidemiological information. The expected result is Negative.  Fact Sheet for Patients:  EntrepreneurPulse.com.au  Fact Sheet for Healthcare Providers:  IncredibleEmployment.be  This test is no t yet approved or cleared by the [place] FDA and  has been authorized for detection and/or diagnosis of SARS-CoV-2 by FDA under an Emergency Use Authorization (EUA). This EUA will remain  in effect (meaning this test can be used) for the duration of the COVID-19 declaration under Section 564(b)(1) of the Act, 21 U.S.C.section 360bbb-3(b)(1), unless the authorization is terminated  or revoked sooner.       Influenza A by PCR NEGATIVE NEGATIVE Final   Influenza B by PCR NEGATIVE NEGATIVE Final    Comment: (NOTE) The Xpert Xpress SARS-CoV-2/FLU/RSV plus assay is intended as an aid in the diagnosis of influenza from Nasopharyngeal swab specimens and should not be used as a sole basis for treatment. Nasal washings and aspirates are unacceptable for Xpert Xpress SARS-CoV-2/FLU/RSV testing.  Fact Sheet for Patients: EntrepreneurPulse.com.au  Fact Sheet for Healthcare Providers: IncredibleEmployment.be  This test is not yet approved or cleared by the [place] FDA and has been authorized for detection and/or diagnosis  of SARS-CoV-2 by FDA under an Emergency Use Authorization (EUA). This EUA will remain in effect (meaning this test can be used) for the duration of the COVID-19 declaration under Section 564(b)(1) of the Act, 21 U.S.C. section 360bbb-3(b)(1), unless the  authorization is terminated or revoked.  Performed at [facility], [address], [place], [place] [address]   Culture, blood (routine x 2)     Status: Abnormal   Collection Time: 12/18/20 10:00 PM   Specimen: BLOOD  Result Value Ref Range Status   Specimen Description   Final    BLOOD RIGHT HAND Performed at [facility], [address] [place], [place] [address]    Special Requests   Final    BOTTLES DRAWN AEROBIC AND ANAEROBIC Blood Culture adequate volume Performed at [facility], [address], [place], [place] [address]    Culture  Setup Time   Final    GRAM POSITIVE COCCI IN BOTH AEROBIC AND ANAEROBIC BOTTLES CRITICAL RESULT CALLED TO, READ BACK BY AND VERIFIED WITH: VALERIE POWELL,RN 12/20/2020 AT 0522 A.HUGHES    Culture (A)  Final    STAPHYLOCOCCUS AUREUS SUSCEPTIBILITIES PERFORMED ON PREVIOUS CULTURE WITHIN THE LAST 5 DAYS. Performed at [facility], [place] [address], [place], [place] [address]    Report Status 12/22/2020 FINAL  Final  Culture, blood (routine x 2)     Status: Abnormal   Collection Time: 12/18/20 10:00 PM   Specimen: BLOOD  Result Value Ref Range Status   Specimen Description   Final    BLOOD LEFT HAND Performed at [facility], [address] [place], [place] [address]    Special Requests   Final    BOTTLES DRAWN AEROBIC AND ANAEROBIC Blood Culture adequate volume Performed at [facility], [address] [place], [place] [address]    Culture  Setup Time   Final    GRAM POSITIVE COCCI IN BOTH AEROBIC AND ANAEROBIC BOTTLES CRITICAL RESULT CALLED TO, READ BACK BY AND VERIFIED WITH: VALERIE POWELL,RN 12/20/2020 AT [place] A.HUGHES Performed at [facility], [place] [address], [place], [place] [address]    Culture METHICILLIN RESISTANT STAPHYLOCOCCUS AUREUS (A)  Final   Report Status 12/22/2020 FINAL  Final   Organism ID, Bacteria  METHICILLIN RESISTANT STAPHYLOCOCCUS AUREUS  Final      Susceptibility   Methicillin resistant staphylococcus aureus - MIC*    CIPROFLOXACIN >=8 RESISTANT Resistant     ERYTHROMYCIN >=8 RESISTANT Resistant     GENTAMICIN <=0.5 SENSITIVE Sensitive     OXACILLIN >=4 RESISTANT Resistant     TETRACYCLINE <=1 SENSITIVE Sensitive     VANCOMYCIN <=0.5 SENSITIVE Sensitive     TRIMETH/SULFA <=10 SENSITIVE Sensitive     CLINDAMYCIN <=0.25 SENSITIVE Sensitive     RIFAMPIN <=0.5 SENSITIVE Sensitive     Inducible Clindamycin NEGATIVE Sensitive     * METHICILLIN RESISTANT STAPHYLOCOCCUS AUREUS  Blood Culture ID Panel (Reflexed)     Status: Abnormal   Collection Time: 12/18/20 10:00 PM  Result Value Ref Range Status   Enterococcus faecalis NOT DETECTED NOT DETECTED Final   Enterococcus Faecium NOT DETECTED NOT DETECTED Final   Listeria monocytogenes NOT DETECTED NOT DETECTED Final   Staphylococcus species DETECTED (A) NOT DETECTED Final    Comment: CRITICAL RESULT CALLED TO, READ BACK BY AND VERIFIED WITH: VALERIE POWELL,RN 12/20/2020 AT 0522 A.HUGHES    Staphylococcus aureus (BCID) DETECTED (A) NOT DETECTED Final    Comment: Methicillin (oxacillin)-resistant Staphylococcus aureus (MRSA).  MRSA is predictably resistant to beta-lactam antibiotics (except ceftaroline). Preferred therapy is vancomycin unless clinically contraindicated. Patient requires contact precautions if  hospitalized. CRITICAL RESULT CALLED TO, READ BACK BY AND VERIFIED WITH: VALERIE POWELL,RN 12/20/2020 AT 0522 A.HUGHES    Staphylococcus epidermidis NOT DETECTED NOT DETECTED Final   Staphylococcus lugdunensis NOT DETECTED NOT DETECTED Final   Streptococcus species NOT DETECTED NOT DETECTED Final   Streptococcus agalactiae NOT DETECTED NOT DETECTED Final   Streptococcus pneumoniae NOT DETECTED NOT DETECTED Final   Streptococcus pyogenes NOT DETECTED NOT DETECTED Final   A.calcoaceticus-baumannii NOT DETECTED NOT DETECTED Final    Bacteroides fragilis NOT DETECTED NOT DETECTED Final   Enterobacterales NOT DETECTED NOT DETECTED Final   Enterobacter cloacae complex NOT DETECTED NOT DETECTED Final   Escherichia coli NOT DETECTED NOT DETECTED Final   Klebsiella aerogenes NOT DETECTED NOT DETECTED Final   Klebsiella oxytoca NOT DETECTED NOT DETECTED Final   Klebsiella pneumoniae NOT DETECTED NOT DETECTED Final   Proteus species NOT DETECTED NOT DETECTED Final   Salmonella species NOT DETECTED NOT DETECTED Final   Serratia marcescens NOT DETECTED NOT DETECTED Final   Haemophilus influenzae NOT DETECTED NOT DETECTED Final   Neisseria meningitidis NOT DETECTED NOT DETECTED Final   Pseudomonas aeruginosa NOT DETECTED NOT DETECTED Final   Stenotrophomonas maltophilia NOT DETECTED NOT DETECTED Final   Candida albicans NOT DETECTED NOT DETECTED Final   Candida auris NOT DETECTED NOT DETECTED Final   Candida glabrata NOT DETECTED NOT DETECTED Final   Candida krusei NOT DETECTED NOT DETECTED Final   Candida parapsilosis NOT DETECTED NOT DETECTED Final   Candida tropicalis NOT DETECTED NOT DETECTED Final   Cryptococcus neoformans/gattii NOT DETECTED NOT DETECTED Final   Meth resistant mecA/C and MREJ DETECTED (A) NOT DETECTED Final    Comment: CRITICAL RESULT CALLED TO, READ BACK BY AND VERIFIED WITH: VALERIE POWELL,RN 12/20/2020 AT 0522 A.HUGHES Performed at [facility], [place] [address] [place], [place] [address]   Aerobic/Anaerobic Culture (surgical/deep wound)     Status: None   Collection Time: 12/20/20 10:02 AM   Specimen: Abscess  Result Value Ref Range Status   Specimen Description   Final    ABSCESS LT SI JOINT ASPIRATION Performed at [facility] [address] [place], [place] [address]    Special Requests   Final    NONE Performed at [facility], [place] [address], [place], [place] [address]    Gram Stain NO WBC SEEN RARE GRAM POSITIVE COCCI   Final   Culture    Final    FEW METHICILLIN RESISTANT STAPHYLOCOCCUS AUREUS NO ANAEROBES ISOLATED Performed at [facility], [place] [address], [place], [place] [address]    Report Status 12/26/2020 FINAL  Final   Organism ID, Bacteria METHICILLIN RESISTANT STAPHYLOCOCCUS AUREUS  Final      Susceptibility   Methicillin resistant staphylococcus aureus - MIC*    CIPROFLOXACIN >=8 RESISTANT Resistant     ERYTHROMYCIN >=8 RESISTANT Resistant     GENTAMICIN <=0.5 SENSITIVE Sensitive     OXACILLIN >=4 RESISTANT Resistant     TETRACYCLINE <=1 SENSITIVE Sensitive     VANCOMYCIN <=0.5 SENSITIVE Sensitive     TRIMETH/SULFA <=10 SENSITIVE Sensitive     CLINDAMYCIN <=0.25 SENSITIVE Sensitive     RIFAMPIN <=0.5 SENSITIVE Sensitive     Inducible Clindamycin NEGATIVE Sensitive     * FEW METHICILLIN RESISTANT STAPHYLOCOCCUS AUREUS  Culture, blood (routine x 2)     Status: None  Collection Time: 12/21/20 12:09 PM   Specimen: BLOOD  Result Value Ref Range Status   Specimen Description   Final    BLOOD LEFT ANTECUBITAL Performed at [facility] [address] [place], [place] [address]    Special Requests   Final    BOTTLES DRAWN AEROBIC AND ANAEROBIC Blood Culture results may not be optimal due to an excessive volume of blood received in culture bottles   Culture   Final    NO GROWTH 5 DAYS Performed at [facility], [place] [address] [place], [place] [address]    Report Status 12/26/2020 FINAL  Final  Culture, blood (routine x 2)     Status: None   Collection Time: 12/21/20 12:20 PM   Specimen: BLOOD RIGHT HAND  Result Value Ref Range Status   Specimen Description   Final    BLOOD RIGHT HAND Performed at [facility] [address] [place], [place] [address]    Special Requests   Final    BOTTLES DRAWN AEROBIC AND ANAEROBIC Blood Culture adequate volume Performed at [facility] [address] [place], [place] [address]    Culture    Final    NO GROWTH 5 DAYS Performed at [facility], [place] [address] [place], [place] [address]    Report Status 12/26/2020 FINAL  Final  Culture, blood (routine x 2)     Status: Abnormal   Collection Time: 12/24/20 11:53 AM   Specimen: BLOOD LEFT HAND  Result Value Ref Range Status   Specimen Description   Final    BLOOD LEFT HAND Performed at [facility] [address], [place], [place] [address]    Special Requests   Final    BOTTLES DRAWN AEROBIC ONLY Blood Culture adequate volume Performed at [facility] [address], [place], [place] [address]    Culture  Setup Time   Final    AEROBIC BOTTLE ONLY GRAM POSITIVE COCCI CRITICAL RESULT CALLED TO, READ BACK BY AND VERIFIED WITH: C. Roseanne Kaufman, AT 1505 12/25/20 [place] Performed at [facility], [place] [address] [place] [address] [place],  [address]    Culture STAPHYLOCOCCUS AUREUS (A)  Final   Report Status 12/27/2020 FINAL  Final   Organism ID, Bacteria STAPHYLOCOCCUS AUREUS  Final      Susceptibility   Staphylococcus aureus - MIC*    CIPROFLOXACIN >=8 RESISTANT Resistant     ERYTHROMYCIN >=8 RESISTANT Resistant     GENTAMICIN <=0.5 SENSITIVE Sensitive     OXACILLIN >=4 RESISTANT Resistant     TETRACYCLINE <=1 SENSITIVE Sensitive     VANCOMYCIN <=0.5 SENSITIVE Sensitive     TRIMETH/SULFA <=10 SENSITIVE Sensitive     CLINDAMYCIN <=0.25 SENSITIVE Sensitive     RIFAMPIN <=0.5 SENSITIVE Sensitive     Inducible Clindamycin NEGATIVE Sensitive     * STAPHYLOCOCCUS AUREUS  Blood Culture ID Panel (Reflexed)     Status: Abnormal   Collection Time: 12/24/20 11:53 AM  Result Value Ref Range Status   Enterococcus faecalis NOT DETECTED NOT DETECTED Final   Enterococcus Faecium NOT DETECTED NOT DETECTED Final   Listeria monocytogenes NOT DETECTED NOT DETECTED Final   Staphylococcus species DETECTED (A) NOT DETECTED Final    Comment: CRITICAL RESULT CALLED TO, READ BACK BY AND VERIFIED  WITH: C. SHADE PHARMD, AT 8016 12/25/20 D. VANHOOK    Staphylococcus aureus (BCID) DETECTED (A) NOT DETECTED Final    Comment: Methicillin (  oxacillin)-resistant Staphylococcus aureus (MRSA). MRSA is predictably resistant to beta-lactam antibiotics (except ceftaroline). Preferred therapy is vancomycin unless clinically contraindicated. Patient requires contact precautions if  hospitalized. CRITICAL RESULT CALLED TO, READ BACK BY AND VERIFIED WITH: C. SHADE PHARMD, AT 5809 12/25/20 D. VANHOOK    Staphylococcus epidermidis NOT DETECTED NOT DETECTED Final   Staphylococcus lugdunensis NOT DETECTED NOT DETECTED Final   Streptococcus species NOT DETECTED NOT DETECTED Final   Streptococcus agalactiae NOT DETECTED NOT DETECTED Final   Streptococcus pneumoniae NOT DETECTED NOT DETECTED Final   Streptococcus pyogenes NOT DETECTED NOT DETECTED Final   A.calcoaceticus-baumannii NOT DETECTED NOT DETECTED Final   Bacteroides fragilis NOT DETECTED NOT DETECTED Final   Enterobacterales NOT DETECTED NOT DETECTED Final   Enterobacter cloacae complex NOT DETECTED NOT DETECTED Final   Escherichia coli NOT DETECTED NOT DETECTED Final   Klebsiella aerogenes NOT DETECTED NOT DETECTED Final   Klebsiella oxytoca NOT DETECTED NOT DETECTED Final   Klebsiella pneumoniae NOT DETECTED NOT DETECTED Final   Proteus species NOT DETECTED NOT DETECTED Final   Salmonella species NOT DETECTED NOT DETECTED Final   Serratia marcescens NOT DETECTED NOT DETECTED Final   Haemophilus influenzae NOT DETECTED NOT DETECTED Final   Neisseria meningitidis NOT DETECTED NOT DETECTED Final   Pseudomonas aeruginosa NOT DETECTED NOT DETECTED Final   Stenotrophomonas maltophilia NOT DETECTED NOT DETECTED Final   Candida albicans NOT DETECTED NOT DETECTED Final   Candida auris NOT DETECTED NOT DETECTED Final   Candida glabrata NOT DETECTED NOT DETECTED Final   Candida krusei NOT DETECTED NOT DETECTED Final   Candida parapsilosis NOT DETECTED  NOT DETECTED Final   Candida tropicalis NOT DETECTED NOT DETECTED Final   Cryptococcus neoformans/gattii NOT DETECTED NOT DETECTED Final   Meth resistant mecA/C and MREJ DETECTED (A) NOT DETECTED Final    Comment: CRITICAL RESULT CALLED TO, READ BACK BY AND VERIFIED WITH: C. Roseanne Kaufman, AT 9833 12/25/20 [place] Performed at [facility], [place] [address] [place], [place] [address]   Culture, blood (routine x 2)     Status: None (Preliminary result)   Collection Time: 12/27/20  5:52 PM   Specimen: BLOOD LEFT HAND  Result Value Ref Range Status   Specimen Description   Final    BLOOD LEFT HAND Performed at [facility] [address], [place], [place] [address]    Special Requests   Final    BOTTLES DRAWN AEROBIC ONLY Blood Culture results may not be optimal due to an inadequate volume of blood received in culture bottles Performed at [facility] [address], [place], [place] [address]    Culture   Final    NO GROWTH < 12 HOURS Performed at [facility] [address], [place], [place] [address]    Report Status PENDING  Incomplete    Impression/Plan:  1. 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  Orthopedics has reevaluated and has recommended an aspiration by IR of the area followed by a spine specialist evaluation if no improvement.   Will continue with daptomycin.  2.  MRSA bacteremia - repeat blood culture yesterday ngtd.  Last positive blood culture 12/24/20.   TTE without vegetation.  Will need a prolonged course as above through 02/14/21.

## 2020-12-28 NOTE — Consult Note (Addendum)
Patient ID: Nicole Hines MRN: [id number] DOB/AGE: 11-18-1960 60 y.o.  Admit date: 12/18/2020  Chief Complaint: Left hip pain  Subjective: Patient is a 60 y.o. female who presented to [facility] Emergency Department due to severe left hip pain and inability to bear weight. 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 Only 1 cc was able to be aspirated, cultures showed MRSA. CT was obtained of the left hip yesterday due to persistent pain, which showed a developing phlegmon in the left iliac muscle secondary to the SI joint infection. Currently on IV daptomycin. Orthopedics was consulted for management. Patient is seen today, complains of left SI joint pain that worsens with any attempted weightbearing.    Allergies: No Known Allergies   Medications: Medications Prior to Admission  Medication Sig Dispense Refill Last Dose  . ALPRAZolam (XANAX) 0.5 MG tablet Take 1 mg by mouth at bedtime as needed for sleep. For anxiety   Past Week at Unknown time  . Calcium Carbonate-Vitamin D (CALCIUM 600+D3 PO) Take 1 tablet by mouth 2 (two) times daily.   Past Week at Unknown time  . citalopram (CELEXA) 40 MG tablet Take 40 mg by mouth every morning.   Past Week at Unknown time  . clobetasol cream (TEMOVATE) 1.27 % Apply 1 application topically at bedtime.   Past Week at Unknown time  . fish oil-omega-3 fatty acids 1000 MG capsule Take 1 g by mouth 2 (two) times daily.   Past Week at Unknown time  . gabapentin (NEURONTIN) 100 MG capsule Take 300 mg by mouth at bedtime.   Past Week at Unknown time  . Glucosamine-Chondroit-Vit C-Mn (GLUCOSAMINE 1500 COMPLEX PO) Take 1 capsule by mouth 2 (two) times daily.   Past Week at Unknown time  . meloxicam (MOBIC) 15 MG tablet Take 15 mg by mouth daily.   Past Week at Unknown time  . methocarbamol (ROBAXIN) 750 MG tablet Take 750 mg by mouth at bedtime.   Past Week at Unknown time  . montelukast (SINGULAIR) 10 MG  tablet Take 10 mg by mouth every morning.    Past Week at Unknown time  . Multiple Vitamin (MULTIVITAMIN WITH MINERALS) TABS tablet Take 1 tablet by mouth daily.   Past Week at Unknown time  . omeprazole (PRILOSEC) 20 MG capsule Take 1 capsule by mouth daily.   Past Week at Unknown time  . oxyCODONE-acetaminophen (PERCOCET) 10-325 MG per tablet Take 1 tablet by mouth every 6 (six) hours as needed for pain.    Past Week at Unknown time  . vitamin B-12 (CYANOCOBALAMIN) 1000 MCG tablet Take 1,000 mcg by mouth daily.   Past Week at Unknown time  . Fluticasone-Salmeterol (ADVAIR) 250-50 MCG/DOSE AEPB Inhale 2 puffs into the lungs 2 (two) times daily as needed (sob/wheezing).   More than a month at Unknown time    Past Medical History: Past Medical History:  Diagnosis Date  . Amenorrhea    irreg cycle  . Anxiety   . Arthritis   . Asthma   . MRSA infection   . Obesity      Past Surgical History: Past Surgical History:  Procedure Laterality Date  . COLONOSCOPY WITH PROPOFOL N/A 01/08/2014   Procedure: COLONOSCOPY WITH PROPOFOL;  Surgeon: [name], MD;  Location: [facility] ENDOSCOPY;  Service: Endoscopy;  Laterality: N/A;  . DILATION AND CURETTAGE OF UTERUS  1990  . LAPAROSCOPIC CHOLECYSTECTOMY  1994  . mrsa  had to have area cut out. abdomen     Family History: Family History  Problem Relation Age of Onset  . Cancer Mother        pancreatic  . Diabetes Mother   . Hypertension Mother   . Heart attack Father   . Heart attack Maternal Grandmother   . Diabetes Brother   . Cancer Maternal Aunt        uterine  . Diabetes Maternal Aunt   . Colon cancer Neg Hx     Social History: Social History   Tobacco Use  . Smoking status: Never Smoker  . Smokeless tobacco: Never Used  Substance Use Topics  . Alcohol use: No     Review of Systems Constitutional: negative Eyes: negative Ears, nose, mouth, throat, and face: negative Respiratory: negative Cardiovascular:  negative Gastrointestinal: negative  Physical Exam: General: alert and no distress HENT:Head: Normocephalic, no lesions, without obvious abnormality. Neck:supple Abdomen:abdomen soft Rectal/Breast/Genitalia: Not done, not pertinent to present illness. Musculoskeletal:Neurologically intact Neurovascular intact Sensation intact distally Dorsiflexion/Plantar flexion intact No pain with passive range of motion of the left hip  LABS: Results for orders placed or performed during the hospital encounter of 12/18/20  Resp Panel by RT-PCR (Flu A&B, Covid) Nasopharyngeal Swab   Specimen: Nasopharyngeal Swab; Nasopharyngeal(NP) swabs in vial transport medium  Result Value Ref Range   SARS Coronavirus 2 by RT PCR NEGATIVE NEGATIVE   Influenza A by PCR NEGATIVE NEGATIVE   Influenza B by PCR NEGATIVE NEGATIVE  Culture, blood (routine x 2)   Specimen: BLOOD  Result Value Ref Range   Specimen Description      BLOOD RIGHT HAND Performed at [facility], [address], [place], [place] [address]    Special Requests      BOTTLES DRAWN AEROBIC AND ANAEROBIC Blood Culture adequate volume Performed at [facility], [address] [place], [place] [address]    Culture  Setup Time      [place] TO, READ BACK BY AND VERIFIED WITH: VALERIE POWELL,RN 12/20/2020 AT 0522 A.HUGHES    Culture (A)     STAPHYLOCOCCUS AUREUS SUSCEPTIBILITIES PERFORMED ON PREVIOUS CULTURE WITHIN THE LAST 5 DAYS. Performed at [facility], [place] [address], [place], [place] [address]    Report Status 12/22/2020 FINAL   Culture, blood (routine x 2)   Specimen: BLOOD  Result Value Ref Range   Specimen Description      BLOOD LEFT HAND Performed at [facility], [address] [place], [place] [address]    Special Requests      BOTTLES DRAWN AEROBIC AND ANAEROBIC Blood Culture  adequate volume Performed at [facility], [address] [place], [place] [address]    Culture  Setup Time      GRAM POSITIVE COCCI IN BOTH AEROBIC AND ANAEROBIC BOTTLES CRITICAL RESULT CALLED TO, READ BACK BY AND VERIFIED WITH: VALERIE POWELL,RN 12/20/2020 AT [place] A.HUGHES Performed at [facility], [place] [address] [place], [place] [address]    Culture METHICILLIN RESISTANT STAPHYLOCOCCUS AUREUS (A)    Report Status 12/22/2020 FINAL    Organism ID, Bacteria METHICILLIN RESISTANT STAPHYLOCOCCUS AUREUS       Susceptibility   Methicillin resistant staphylococcus aureus - MIC*    CIPROFLOXACIN >=8 RESISTANT Resistant     ERYTHROMYCIN >=8 RESISTANT Resistant     GENTAMICIN <=0.5 SENSITIVE Sensitive     OXACILLIN >=4 RESISTANT Resistant  TETRACYCLINE <=1 SENSITIVE Sensitive     VANCOMYCIN <=0.5 SENSITIVE Sensitive     TRIMETH/SULFA <=10 SENSITIVE Sensitive     CLINDAMYCIN <=0.25 SENSITIVE Sensitive     RIFAMPIN <=0.5 SENSITIVE Sensitive     Inducible Clindamycin NEGATIVE Sensitive     * METHICILLIN RESISTANT STAPHYLOCOCCUS AUREUS  Blood Culture ID Panel (Reflexed)  Result Value Ref Range   Enterococcus faecalis NOT DETECTED NOT DETECTED   Enterococcus Faecium NOT DETECTED NOT DETECTED   Listeria monocytogenes NOT DETECTED NOT DETECTED   Staphylococcus species DETECTED (A) NOT DETECTED   Staphylococcus aureus (BCID) DETECTED (A) NOT DETECTED   Staphylococcus epidermidis NOT DETECTED NOT DETECTED   Staphylococcus lugdunensis NOT DETECTED NOT DETECTED   Streptococcus species NOT DETECTED NOT DETECTED   Streptococcus agalactiae NOT DETECTED NOT DETECTED   Streptococcus pneumoniae NOT DETECTED NOT DETECTED   Streptococcus pyogenes NOT DETECTED NOT DETECTED   A.calcoaceticus-baumannii NOT DETECTED NOT DETECTED   Bacteroides fragilis NOT DETECTED NOT DETECTED   Enterobacterales NOT DETECTED NOT DETECTED   Enterobacter cloacae complex NOT DETECTED NOT  DETECTED   Escherichia coli NOT DETECTED NOT DETECTED   Klebsiella aerogenes NOT DETECTED NOT DETECTED   Klebsiella oxytoca NOT DETECTED NOT DETECTED   Klebsiella pneumoniae NOT DETECTED NOT DETECTED   Proteus species NOT DETECTED NOT DETECTED   Salmonella species NOT DETECTED NOT DETECTED   Serratia marcescens NOT DETECTED NOT DETECTED   Haemophilus influenzae NOT DETECTED NOT DETECTED   Neisseria meningitidis NOT DETECTED NOT DETECTED   Pseudomonas aeruginosa NOT DETECTED NOT DETECTED   Stenotrophomonas maltophilia NOT DETECTED NOT DETECTED   Candida albicans NOT DETECTED NOT DETECTED   Candida auris NOT DETECTED NOT DETECTED   Candida glabrata NOT DETECTED NOT DETECTED   Candida krusei NOT DETECTED NOT DETECTED   Candida parapsilosis NOT DETECTED NOT DETECTED   Candida tropicalis NOT DETECTED NOT DETECTED   Cryptococcus neoformans/gattii NOT DETECTED NOT DETECTED   Meth resistant mecA/C and MREJ DETECTED (A) NOT DETECTED  Aerobic/Anaerobic Culture (surgical/deep wound)   Specimen: Abscess  Result Value Ref Range   Specimen Description      ABSCESS LT SI JOINT ASPIRATION Performed at [place] [address] [place], [place] [address]    Special Requests      NONE Performed at [facility], [place] [address] [place], [place] [address]    Gram Stain NO WBC SEEN RARE GRAM POSITIVE COCCI     Culture      FEW METHICILLIN RESISTANT STAPHYLOCOCCUS AUREUS NO ANAEROBES ISOLATED Performed at [facility], [place] [address], [place], [place] [address]    Report Status 12/26/2020 FINAL    Organism ID, Bacteria METHICILLIN RESISTANT STAPHYLOCOCCUS AUREUS       Susceptibility   Methicillin resistant staphylococcus aureus - MIC*    CIPROFLOXACIN >=8 RESISTANT Resistant     ERYTHROMYCIN >=8 RESISTANT Resistant     GENTAMICIN <=0.5 SENSITIVE Sensitive     OXACILLIN >=4 RESISTANT Resistant     TETRACYCLINE <=1 SENSITIVE Sensitive      VANCOMYCIN <=0.5 SENSITIVE Sensitive     TRIMETH/SULFA <=10 SENSITIVE Sensitive     CLINDAMYCIN <=0.25 SENSITIVE Sensitive     RIFAMPIN <=0.5 SENSITIVE Sensitive     Inducible Clindamycin NEGATIVE Sensitive     * FEW METHICILLIN RESISTANT STAPHYLOCOCCUS AUREUS  Culture, blood (routine x 2)   Specimen: BLOOD  Result Value Ref Range   Specimen Description      BLOOD LEFT ANTECUBITAL Performed at [facility]  [place] [address], [place], [place] [address]    Special Requests      BOTTLES DRAWN AEROBIC AND ANAEROBIC Blood Culture results may not be optimal due to an excessive volume of blood received in culture bottles   Culture      NO GROWTH 5 DAYS Performed at [facility] [address], [place], [place] [address]    Report Status 12/26/2020 FINAL   Culture, blood (routine x 2)   Specimen: BLOOD RIGHT HAND  Result Value Ref Range   Specimen Description      BLOOD RIGHT HAND Performed at [facility] [address], [place], [place] [address]    Special Requests      BOTTLES DRAWN AEROBIC AND ANAEROBIC Blood Culture adequate volume Performed at [facility] [address], [place], [place] [address]    Culture      NO GROWTH 5 DAYS Performed at [facility], [place] [address] [place], [place] [address]    Report Status 12/26/2020 FINAL   Culture, blood (routine x 2)   Specimen: BLOOD LEFT HAND  Result Value Ref Range   Specimen Description      BLOOD LEFT HAND Performed at [facility] [address] [place], [place] [address]    Special Requests      BOTTLES DRAWN AEROBIC ONLY Blood Culture adequate volume Performed at [facility] [address], [place], [place] [address]    Culture  Setup Time      AEROBIC BOTTLE ONLY GRAM POSITIVE COCCI CRITICAL RESULT CALLED TO, READ BACK BY AND VERIFIED WITH: C. Roseanne Kaufman, AT 2620 12/25/20 [place] Performed at [facility], [place] [address] [place], [place] [address]    Culture STAPHYLOCOCCUS AUREUS (A)    Report Status 12/27/2020 FINAL    Organism ID, Bacteria STAPHYLOCOCCUS AUREUS       Susceptibility   Staphylococcus aureus - MIC*    CIPROFLOXACIN >=8 RESISTANT Resistant     ERYTHROMYCIN >=8 RESISTANT Resistant     GENTAMICIN <=0.5 SENSITIVE Sensitive     OXACILLIN >=4 RESISTANT Resistant     TETRACYCLINE <=1 SENSITIVE Sensitive     VANCOMYCIN <=0.5 SENSITIVE Sensitive     TRIMETH/SULFA <=10 SENSITIVE Sensitive     CLINDAMYCIN <=0.25 SENSITIVE Sensitive     RIFAMPIN <=0.5 SENSITIVE Sensitive     Inducible Clindamycin NEGATIVE Sensitive     * STAPHYLOCOCCUS AUREUS  Blood Culture ID Panel (Reflexed)  Result Value Ref Range   Enterococcus faecalis NOT DETECTED NOT DETECTED   Enterococcus Faecium NOT DETECTED NOT DETECTED   Listeria monocytogenes NOT DETECTED NOT DETECTED   Staphylococcus species DETECTED (A) NOT DETECTED   Staphylococcus aureus (BCID) DETECTED (A) NOT DETECTED   Staphylococcus epidermidis NOT DETECTED NOT DETECTED   Staphylococcus lugdunensis NOT DETECTED NOT DETECTED   Streptococcus species NOT DETECTED NOT DETECTED   Streptococcus agalactiae NOT DETECTED NOT DETECTED   Streptococcus pneumoniae NOT DETECTED NOT DETECTED   Streptococcus pyogenes NOT DETECTED NOT DETECTED   A.calcoaceticus-baumannii NOT DETECTED NOT DETECTED   Bacteroides fragilis NOT DETECTED NOT DETECTED   Enterobacterales NOT DETECTED NOT DETECTED   Enterobacter cloacae complex NOT DETECTED NOT DETECTED   Escherichia coli NOT DETECTED NOT DETECTED   Klebsiella aerogenes NOT DETECTED NOT DETECTED   Klebsiella oxytoca NOT DETECTED NOT DETECTED   Klebsiella pneumoniae NOT DETECTED NOT DETECTED   Proteus species NOT DETECTED NOT DETECTED  Salmonella species NOT DETECTED NOT DETECTED   Serratia marcescens NOT DETECTED NOT DETECTED   Haemophilus influenzae NOT DETECTED NOT DETECTED   Neisseria  meningitidis NOT DETECTED NOT DETECTED   Pseudomonas aeruginosa NOT DETECTED NOT DETECTED   Stenotrophomonas maltophilia NOT DETECTED NOT DETECTED   Candida albicans NOT DETECTED NOT DETECTED   Candida auris NOT DETECTED NOT DETECTED   Candida glabrata NOT DETECTED NOT DETECTED   Candida krusei NOT DETECTED NOT DETECTED   Candida parapsilosis NOT DETECTED NOT DETECTED   Candida tropicalis NOT DETECTED NOT DETECTED   Cryptococcus neoformans/gattii NOT DETECTED NOT DETECTED   Meth resistant mecA/C and MREJ DETECTED (A) NOT DETECTED  Culture, blood (routine x 2)   Specimen: BLOOD LEFT HAND  Result Value Ref Range   Specimen Description      BLOOD LEFT HAND Performed at [facility] [address], [place], [place] [address]    Special Requests      BOTTLES DRAWN AEROBIC ONLY Blood Culture results may not be optimal due to an inadequate volume of blood received in culture bottles Performed at [facility] [address] [place] [address], [place], [place] [address]    Culture      NO GROWTH < 12 HOURS Performed at [facility] [address], [place], [place] [address]    Report Status PENDING   CBC with Differential  Result Value Ref Range   WBC 16.0 (H) 4.0 - 10.5 K/uL   RBC 4.09 3.87 - 5.11 MIL/uL   Hemoglobin 12.5 12.0 - 15.0 g/dL   HCT 37.9 36.0 - 46.0 %   MCV 92.7 80.0 - 100.0 fL   MCH 30.6 26.0 - 34.0 pg   MCHC 33.0 30.0 - 36.0 g/dL   RDW 12.8 11.5 - 15.5 %   Platelets 205 150 - 400 K/uL   nRBC 0.0 0.0 - 0.2 %   Neutrophils Relative % 85 %   Neutro Abs 13.6 (H) 1.7 - 7.7 K/uL   Lymphocytes Relative 4 %   Lymphs Abs 0.6 (L) 0.7 - 4.0 K/uL   Monocytes Relative 10 %   Monocytes Absolute 1.6 (H) 0.1 - 1.0 K/uL   Eosinophils Relative 0 %   Eosinophils Absolute 0.0 0.0 - 0.5 K/uL   Basophils Relative 0 %   Basophils Absolute 0.0 0.0 - 0.1 K/uL   Immature Granulocytes 1 %   Abs Immature Granulocytes 0.09 (H) 0.00 - 0.07 K/uL  Comprehensive  metabolic panel  Result Value Ref Range   Sodium 137 135 - 145 mmol/L   Potassium 3.1 (L) 3.5 - 5.1 mmol/L   Chloride 101 98 - 111 mmol/L   CO2 27 22 - 32 mmol/L   Glucose, Bld 158 (H) 70 - 99 mg/dL   BUN 15 6 - 20 mg/dL   Creatinine, Ser 0.54 0.44 - 1.00 mg/dL   Calcium 9.7 8.9 - 10.3 mg/dL   Total Protein 7.6 6.5 - 8.1 g/dL   Albumin 4.2 3.5 - 5.0 g/dL   AST 15 15 - 41 U/L   ALT 16 0 - 44 U/L   Alkaline Phosphatase 63 38 - 126 U/L   Total Bilirubin 0.9 0.3 - 1.2 mg/dL   GFR, Estimated >60 >60 mL/min   Anion gap 9 5 - 15  Urinalysis, Routine w reflex microscopic Urine, Clean Catch  Result Value Ref Range   Color, Urine YELLOW YELLOW   APPearance CLEAR CLEAR   Specific Gravity, Urine >1.046 (H) 1.005 - 1.030  pH 6.5 5.0 - 8.0   Glucose, UA NEGATIVE NEGATIVE mg/dL   Hgb urine dipstick NEGATIVE NEGATIVE   Bilirubin Urine NEGATIVE NEGATIVE   Ketones, ur 15 (A) NEGATIVE mg/dL   Protein, ur 30 (A) NEGATIVE mg/dL   Nitrite NEGATIVE NEGATIVE   Leukocytes,Ua NEGATIVE NEGATIVE   RBC / HPF 0-5 0 - 5 RBC/hpf   WBC, UA 0-5 0 - 5 WBC/hpf   Mucus PRESENT   Lactic acid, plasma  Result Value Ref Range   Lactic Acid, Venous 0.6 0.5 - 1.9 mmol/L  HIV Antibody (routine testing w rflx)  Result Value Ref Range   HIV Screen 4th Generation wRfx Non Reactive Non Reactive  Comprehensive metabolic panel  Result Value Ref Range   Sodium 139 135 - 145 mmol/L   Potassium 4.0 3.5 - 5.1 mmol/L   Chloride 104 98 - 111 mmol/L   CO2 26 22 - 32 mmol/L   Glucose, Bld 185 (H) 70 - 99 mg/dL   BUN 17 6 - 20 mg/dL   Creatinine, Ser 0.59 0.44 - 1.00 mg/dL   Calcium 9.7 8.9 - 10.3 mg/dL   Total Protein 8.0 6.5 - 8.1 g/dL   Albumin 3.4 (L) 3.5 - 5.0 g/dL   AST 25 15 - 41 U/L   ALT 25 0 - 44 U/L   Alkaline Phosphatase 69 38 - 126 U/L   Total Bilirubin 0.8 0.3 - 1.2 mg/dL   GFR, Estimated >60 >60 mL/min   Anion gap 9 5 - 15  CBC WITH DIFFERENTIAL  Result Value Ref Range   WBC 15.5 (H) 4.0 - 10.5 K/uL    RBC 3.91 3.87 - 5.11 MIL/uL   Hemoglobin 12.1 12.0 - 15.0 g/dL   HCT 36.7 36.0 - 46.0 %   MCV 93.9 80.0 - 100.0 fL   MCH 30.9 26.0 - 34.0 pg   MCHC 33.0 30.0 - 36.0 g/dL   RDW 12.9 11.5 - 15.5 %   Platelets 191 150 - 400 K/uL   nRBC 0.0 0.0 - 0.2 %   Neutrophils Relative % 87 %   Neutro Abs 13.5 (H) 1.7 - 7.7 K/uL   Lymphocytes Relative 3 %   Lymphs Abs 0.4 (L) 0.7 - 4.0 K/uL   Monocytes Relative 8 %   Monocytes Absolute 1.3 (H) 0.1 - 1.0 K/uL   Eosinophils Relative 0 %   Eosinophils Absolute 0.0 0.0 - 0.5 K/uL   Basophils Relative 0 %   Basophils Absolute 0.0 0.0 - 0.1 K/uL   Immature Granulocytes 2 %   Abs Immature Granulocytes 0.29 (H) 0.00 - 0.07 K/uL  Hepatitis panel, acute  Result Value Ref Range   Hepatitis B Surface Ag NON REACTIVE NON REACTIVE   HCV Ab NON REACTIVE NON REACTIVE   Hep A IgM NON REACTIVE NON REACTIVE   Hep B C IgM NON REACTIVE NON REACTIVE  CBC with Differential/Platelet  Result Value Ref Range   WBC 13.8 (H) 4.0 - 10.5 K/uL   RBC 4.40 3.87 - 5.11 MIL/uL   Hemoglobin 13.3 12.0 - 15.0 g/dL   HCT 41.7 36.0 - 46.0 %   MCV 94.8 80.0 - 100.0 fL   MCH 30.2 26.0 - 34.0 pg   MCHC 31.9 30.0 - 36.0 g/dL   RDW 12.6 11.5 - 15.5 %   Platelets 179 150 - 400 K/uL   nRBC 0.0 0.0 - 0.2 %   Neutrophils Relative % 78 %   Neutro Abs 10.8 (H) 1.7 - 7.7 K/uL  Lymphocytes Relative 10 %   Lymphs Abs 1.4 0.7 - 4.0 K/uL   Monocytes Relative 12 %   Monocytes Absolute 1.6 (H) 0.1 - 1.0 K/uL   Eosinophils Relative 0 %   Eosinophils Absolute 0.0 0.0 - 0.5 K/uL   Basophils Relative 0 %   Basophils Absolute 0.0 0.0 - 0.1 K/uL   Immature Granulocytes 0 %   Abs Immature Granulocytes 0.06 0.00 - 0.07 K/uL  Basic metabolic panel  Result Value Ref Range   Sodium 134 (L) 135 - 145 mmol/L   Potassium 3.8 3.5 - 5.1 mmol/L   Chloride 97 (L) 98 - 111 mmol/L   CO2 26 22 - 32 mmol/L   Glucose, Bld 128 (H) 70 - 99 mg/dL   BUN 12 6 - 20 mg/dL   Creatinine, Ser 0.51 0.44 -  1.00 mg/dL   Calcium 9.8 8.9 - 10.3 mg/dL   GFR, Estimated >60 >60 mL/min   Anion gap 11 5 - 15  Sedimentation rate  Result Value Ref Range   Sed Rate 114 (H) 0 - 22 mm/hr  C-reactive protein  Result Value Ref Range   CRP 30.2 (H) <1.0 mg/dL  CBC with Differential/Platelet  Result Value Ref Range   WBC 16.6 (H) 4.0 - 10.5 K/uL   RBC 3.85 (L) 3.87 - 5.11 MIL/uL   Hemoglobin 11.6 (L) 12.0 - 15.0 g/dL   HCT 35.4 (L) 36.0 - 46.0 %   MCV 91.9 80.0 - 100.0 fL   MCH 30.1 26.0 - 34.0 pg   MCHC 32.8 30.0 - 36.0 g/dL   RDW 13.0 11.5 - 15.5 %   Platelets 261 150 - 400 K/uL   nRBC 0.0 0.0 - 0.2 %   Neutrophils Relative % 77 %   Neutro Abs 12.9 (H) 1.7 - 7.7 K/uL   Lymphocytes Relative 10 %   Lymphs Abs 1.7 0.7 - 4.0 K/uL   Monocytes Relative 9 %   Monocytes Absolute 1.4 (H) 0.1 - 1.0 K/uL   Eosinophils Relative 1 %   Eosinophils Absolute 0.1 0.0 - 0.5 K/uL   Basophils Relative 1 %   Basophils Absolute 0.1 0.0 - 0.1 K/uL   Immature Granulocytes 2 %   Abs Immature Granulocytes 0.38 (H) 0.00 - 0.07 K/uL  Basic metabolic panel  Result Value Ref Range   Sodium 137 135 - 145 mmol/L   Potassium 3.2 (L) 3.5 - 5.1 mmol/L   Chloride 100 98 - 111 mmol/L   CO2 27 22 - 32 mmol/L   Glucose, Bld 119 (H) 70 - 99 mg/dL   BUN 13 6 - 20 mg/dL   Creatinine, Ser 0.41 (L) 0.44 - 1.00 mg/dL   Calcium 9.3 8.9 - 10.3 mg/dL   GFR, Estimated >60 >60 mL/min   Anion gap 10 5 - 15  Vancomycin, peak  Result Value Ref Range   Vancomycin Pk 9 (L) 30 - 40 ug/mL  CBC with Differential/Platelet  Result Value Ref Range   WBC 18.2 (H) 4.0 - 10.5 K/uL   RBC 3.73 (L) 3.87 - 5.11 MIL/uL   Hemoglobin 11.3 (L) 12.0 - 15.0 g/dL   HCT 34.9 (L) 36.0 - 46.0 %   MCV 93.6 80.0 - 100.0 fL   MCH 30.3 26.0 - 34.0 pg   MCHC 32.4 30.0 - 36.0 g/dL   RDW 13.2 11.5 - 15.5 %   Platelets 309 150 - 400 K/uL   nRBC 0.0 0.0 - 0.2 %   Neutrophils Relative % 73 %  Neutro Abs 13.4 (H) 1.7 - 7.7 K/uL   Lymphocytes Relative 12 %    Lymphs Abs 2.2 0.7 - 4.0 K/uL   Monocytes Relative 8 %   Monocytes Absolute 1.5 (H) 0.1 - 1.0 K/uL   Eosinophils Relative 1 %   Eosinophils Absolute 0.1 0.0 - 0.5 K/uL   Basophils Relative 1 %   Basophils Absolute 0.1 0.0 - 0.1 K/uL   Immature Granulocytes 5 %   Abs Immature Granulocytes 0.89 (H) 0.00 - 0.07 K/uL   Reactive, Benign Lymphocytes PRESENT   Basic metabolic panel  Result Value Ref Range   Sodium 137 135 - 145 mmol/L   Potassium 3.2 (L) 3.5 - 5.1 mmol/L   Chloride 98 98 - 111 mmol/L   CO2 27 22 - 32 mmol/L   Glucose, Bld 121 (H) 70 - 99 mg/dL   BUN 11 6 - 20 mg/dL   Creatinine, Ser 0.56 0.44 - 1.00 mg/dL   Calcium 9.2 8.9 - 10.3 mg/dL   GFR, Estimated >60 >60 mL/min   Anion gap 12 5 - 15  Vancomycin, trough  Result Value Ref Range   Vancomycin Tr 6 (L) 15 - 20 ug/mL  CBC with Differential/Platelet  Result Value Ref Range   WBC 16.3 (H) 4.0 - 10.5 K/uL   RBC 3.53 (L) 3.87 - 5.11 MIL/uL   Hemoglobin 10.6 (L) 12.0 - 15.0 g/dL   HCT 33.6 (L) 36.0 - 46.0 %   MCV 95.2 80.0 - 100.0 fL   MCH 30.0 26.0 - 34.0 pg   MCHC 31.5 30.0 - 36.0 g/dL   RDW 13.6 11.5 - 15.5 %   Platelets 363 150 - 400 K/uL   nRBC 0.0 0.0 - 0.2 %   Neutrophils Relative % 74 %   Neutro Abs 11.9 (H) 1.7 - 7.7 K/uL   Lymphocytes Relative 14 %   Lymphs Abs 2.3 0.7 - 4.0 K/uL   Monocytes Relative 7 %   Monocytes Absolute 1.2 (H) 0.1 - 1.0 K/uL   Eosinophils Relative 1 %   Eosinophils Absolute 0.2 0.0 - 0.5 K/uL   Basophils Relative 0 %   Basophils Absolute 0.1 0.0 - 0.1 K/uL   Immature Granulocytes 4 %   Abs Immature Granulocytes 0.67 (H) 0.00 - 0.07 K/uL  Basic metabolic panel  Result Value Ref Range   Sodium 144 135 - 145 mmol/L   Potassium 3.1 (L) 3.5 - 5.1 mmol/L   Chloride 103 98 - 111 mmol/L   CO2 30 22 - 32 mmol/L   Glucose, Bld 103 (H) 70 - 99 mg/dL   BUN 13 6 - 20 mg/dL   Creatinine, Ser 0.63 0.44 - 1.00 mg/dL   Calcium 9.4 8.9 - 10.3 mg/dL   GFR, Estimated >60 >60 mL/min    Anion gap 11 5 - 15  CK  Result Value Ref Range   Total CK 17 (L) 38 - 234 U/L  CBC with Differential/Platelet  Result Value Ref Range   WBC 14.6 (H) 4.0 - 10.5 K/uL   RBC 3.35 (L) 3.87 - 5.11 MIL/uL   Hemoglobin 10.0 (L) 12.0 - 15.0 g/dL   HCT 31.6 (L) 36.0 - 46.0 %   MCV 94.3 80.0 - 100.0 fL   MCH 29.9 26.0 - 34.0 pg   MCHC 31.6 30.0 - 36.0 g/dL   RDW 13.8 11.5 - 15.5 %   Platelets 373 150 - 400 K/uL   nRBC 0.0 0.0 - 0.2 %   Neutrophils Relative %  75 %   Neutro Abs 10.8 (H) 1.7 - 7.7 K/uL   Lymphocytes Relative 14 %   Lymphs Abs 2.0 0.7 - 4.0 K/uL   Monocytes Relative 7 %   Monocytes Absolute 1.1 (H) 0.1 - 1.0 K/uL   Eosinophils Relative 1 %   Eosinophils Absolute 0.2 0.0 - 0.5 K/uL   Basophils Relative 0 %   Basophils Absolute 0.0 0.0 - 0.1 K/uL   Immature Granulocytes 3 %   Abs Immature Granulocytes 0.48 (H) 0.00 - 0.07 K/uL  Basic metabolic panel  Result Value Ref Range   Sodium 141 135 - 145 mmol/L   Potassium 3.8 3.5 - 5.1 mmol/L   Chloride 101 98 - 111 mmol/L   CO2 32 22 - 32 mmol/L   Glucose, Bld 105 (H) 70 - 99 mg/dL   BUN 10 6 - 20 mg/dL   Creatinine, Ser 0.57 0.44 - 1.00 mg/dL   Calcium 8.9 8.9 - 10.3 mg/dL   GFR, Estimated >60 >60 mL/min   Anion gap 8 5 - 15  ECHOCARDIOGRAM COMPLETE  Result Value Ref Range   Weight 3,746.06 oz   Height 67 in   BP 160/81 mmHg   S' Lateral 3.96 cm   AR max vel 1.02 cm2   AV Area VTI 1.05 cm2   AV Mean grad 28.5 mmHg   AV Peak grad 51.6 mmHg   Ao pk vel 3.59 m/s   Area-P 1/2 2.66 cm2   AV Area mean vel 1.04 cm2  ECHO TEE  Result Value Ref Range   AV Mean grad 34.0 mmHg   AV Peak grad 59.9 mmHg   Ao pk vel 3.87 m/s   Recent Labs    12/26/20 0305  HGB 10.0*   Recent Labs    12/26/20 0305  WBC 14.6*  RBC 3.35*  HCT 31.6*  PLT 373   Recent Labs    12/26/20 0305  NA 141  K 3.8  CL 101  CO2 32  BUN 10  CREATININE 0.57  GLUCOSE 105*  CALCIUM 8.9   Assessment/Plan: 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 Given development of the phlegmon in the left iliac muscle, would recommend another aspiration by IR with drain placement for 2 weeks.   Nicole Duty, PA-C Orthopedic Surgery [facility] [place]   I have reviewed the patients chart and examined her and agree with the above assessment and plan. Hopefully the findings in the iliacus can be addressed with a catheter from IR. I would not be the appropriate surgeon to address the SI joint if it is even necessary to debride that. Would be best addressed by a spine specialist but hopefully surgery will not be necessary with  the drainage procedure/catheter and with the antibiotics.  Nicole Arabian, MD 12/28/20 1921

## 2020-12-29 ENCOUNTER — Inpatient Hospital Stay (HOSPITAL_COMMUNITY): Payer: PRIVATE HEALTH INSURANCE

## 2020-12-29 ENCOUNTER — Encounter (HOSPITAL_COMMUNITY): Payer: Self-pay | Admitting: Cardiology

## 2020-12-29 LAB — CBC WITH DIFFERENTIAL/PLATELET
Abs Immature Granulocytes: 0.11 10*3/uL — ABNORMAL HIGH (ref 0.00–0.07)
Basophils Absolute: 0 10*3/uL (ref 0.0–0.1)
Basophils Relative: 0 %
Eosinophils Absolute: 0.1 10*3/uL (ref 0.0–0.5)
Eosinophils Relative: 2 %
HCT: 30.2 % — ABNORMAL LOW (ref 36.0–46.0)
Hemoglobin: 9.5 g/dL — ABNORMAL LOW (ref 12.0–15.0)
Immature Granulocytes: 1 %
Lymphocytes Relative: 20 %
Lymphs Abs: 1.8 10*3/uL (ref 0.7–4.0)
MCH: 30.1 pg (ref 26.0–34.0)
MCHC: 31.5 g/dL (ref 30.0–36.0)
MCV: 95.6 fL (ref 80.0–100.0)
Monocytes Absolute: 0.6 10*3/uL (ref 0.1–1.0)
Monocytes Relative: 7 %
Neutro Abs: 6.2 10*3/uL (ref 1.7–7.7)
Neutrophils Relative %: 70 %
Platelets: 366 10*3/uL (ref 150–400)
RBC: 3.16 MIL/uL — ABNORMAL LOW (ref 3.87–5.11)
RDW: 13.5 % (ref 11.5–15.5)
WBC: 8.9 10*3/uL (ref 4.0–10.5)
nRBC: 0 % (ref 0.0–0.2)

## 2020-12-29 LAB — BASIC METABOLIC PANEL
Anion gap: 9 (ref 5–15)
BUN: 10 mg/dL (ref 6–20)
CO2: 28 mmol/L (ref 22–32)
Calcium: 9.1 mg/dL (ref 8.9–10.3)
Chloride: 103 mmol/L (ref 98–111)
Creatinine, Ser: 0.34 mg/dL — ABNORMAL LOW (ref 0.44–1.00)
GFR, Estimated: >60 mL/min (ref >60)
Glucose, Bld: 112 mg/dL — ABNORMAL HIGH (ref 70–99)
Potassium: 4.1 mmol/L (ref 3.5–5.1)
Sodium: 140 mmol/L (ref 135–145)

## 2020-12-29 MED ORDER — OXYCODONE HCL 5 MG PO TABS
15.0000 mg | ORAL_TABLET | Freq: Four times a day (QID) | ORAL | Status: DC | PRN
  Administered 2020-12-29 – 2020-12-30 (×3): 15 mg via ORAL
  Filled 2020-12-29 (×3): qty 3

## 2020-12-29 MED ORDER — FENTANYL CITRATE (PF) 100 MCG/2ML IJ SOLN
INTRAMUSCULAR | Status: AC
  Filled 2020-12-29: qty 2

## 2020-12-29 MED ORDER — MIDAZOLAM HCL 2 MG/2ML IJ SOLN
INTRAMUSCULAR | Status: AC | PRN
  Administered 2020-12-29 (×4): 1 mg via INTRAVENOUS

## 2020-12-29 MED ORDER — SODIUM CHLORIDE 0.9 % IV SOLN
INTRAVENOUS | Status: AC
  Administered 2020-12-29: 250 mL
  Filled 2020-12-29: qty 250

## 2020-12-29 MED ORDER — FENTANYL CITRATE (PF) 100 MCG/2ML IJ SOLN
INTRAMUSCULAR | Status: AC | PRN
  Administered 2020-12-29 (×2): 50 ug via INTRAVENOUS

## 2020-12-29 MED ORDER — LIDOCAINE-EPINEPHRINE 1 %-1:100000 IJ SOLN
INTRAMUSCULAR | Status: AC | PRN
  Administered 2020-12-29: 10 mL

## 2020-12-29 MED ORDER — MIDAZOLAM HCL 2 MG/2ML IJ SOLN
INTRAMUSCULAR | Status: AC
  Filled 2020-12-29: qty 4

## 2020-12-29 MED ORDER — DAPTOMYCIN IV (FOR PTA / DISCHARGE USE ONLY): 800.0000 mg | INTRAVENOUS | 0 refills | Status: AC

## 2020-12-29 NOTE — Progress Notes (Signed)
[place] for Infectious Disease   Reason for visit: Follow up on MRSA bacteremia  Interval History: s/p aspiration of fluid around hip most c/w a phlegmon.  She reports worsening pain since procedure.  No associated rash or diarrhea.   Day 11 total antibiotics Day 6 daptomycin  Physical Exam: Constitutional:  Vitals:   12/29/20 1016 12/29/20 1300  BP: (!) 161/92 (!) 142/82  Pulse: 75 80  Resp: 16   Temp:  98.4 F (36.9 C)  SpO2: 98% 100%   patient appears in NAD Skin: no rashes MS: no edema Neuro: non-focal  Review of Systems: Constitutional: negative for fevers and chills Gastrointestinal: negative for diarrhea  Lab Results  Component Value Date   WBC 8.9 12/29/2020   HGB 9.5 (L) 12/29/2020   HCT 30.2 (L) 12/29/2020   MCV 95.6 12/29/2020   PLT 366 12/29/2020    Lab Results  Component Value Date   CREATININE 0.34 (L) 12/29/2020   BUN 10 12/29/2020   NA 140 12/29/2020   K 4.1 12/29/2020   CL 103 12/29/2020   CO2 28 12/29/2020    Lab Results  Component Value Date   ALT 25 12/19/2020   AST 25 12/19/2020   ALKPHOS 69 12/19/2020     Microbiology: Recent Results (from the past 240 hour(s))  Aerobic/Anaerobic Culture (surgical/deep wound)     Status: None   Collection Time: 12/20/20 10:02 AM   Specimen: Abscess  Result Value Ref Range Status   Specimen Description   Final    ABSCESS LT SI JOINT ASPIRATION Performed at [facility], [place] [address], [place], [place] [address]    Special Requests   Final    NONE Performed at [facility], [place] [address] [place], [place] [address]    Gram Stain NO WBC SEEN RARE GRAM POSITIVE COCCI   Final   Culture   Final    FEW METHICILLIN RESISTANT STAPHYLOCOCCUS AUREUS NO ANAEROBES ISOLATED Performed at [facility], [place] [address] [place], [place] [address]    Report Status 12/26/2020 FINAL  Final   Organism ID, Bacteria METHICILLIN RESISTANT STAPHYLOCOCCUS  AUREUS  Final      Susceptibility   Methicillin resistant staphylococcus aureus - MIC*    CIPROFLOXACIN >=8 RESISTANT Resistant     ERYTHROMYCIN >=8 RESISTANT Resistant     GENTAMICIN <=0.5 SENSITIVE Sensitive     OXACILLIN >=4 RESISTANT Resistant     TETRACYCLINE <=1 SENSITIVE Sensitive     VANCOMYCIN <=0.5 SENSITIVE Sensitive     TRIMETH/SULFA <=10 SENSITIVE Sensitive     CLINDAMYCIN <=0.25 SENSITIVE Sensitive     RIFAMPIN <=0.5 SENSITIVE Sensitive     Inducible Clindamycin NEGATIVE Sensitive     * FEW METHICILLIN RESISTANT STAPHYLOCOCCUS AUREUS  Culture, blood (routine x 2)     Status: None   Collection Time: 12/21/20 12:09 PM   Specimen: BLOOD  Result Value Ref Range Status   Specimen Description   Final    BLOOD LEFT ANTECUBITAL Performed at [facility] [address], [place], [place] [address]    Special Requests   Final    BOTTLES DRAWN AEROBIC AND ANAEROBIC Blood Culture results may not be optimal due to an excessive volume of blood received in culture bottles   Culture   Final    NO GROWTH 5 DAYS Performed at [facility], [place] [address] [place], [place] [address]    Report Status 12/26/2020 FINAL  Final  Culture, blood (routine x 2)     Status: None   Collection Time: 12/21/20 12:20 PM   Specimen: BLOOD RIGHT HAND  Result Value Ref Range Status   Specimen Description   Final    BLOOD RIGHT HAND Performed at [facility] [address] [place], [place] [address]    Special Requests   Final    BOTTLES DRAWN AEROBIC AND ANAEROBIC Blood Culture adequate volume Performed at [facility] [address], [place], [place] [address]    Culture   Final    NO GROWTH 5 DAYS Performed at [facility], [place] [address] [place], [place] [address]    Report Status 12/26/2020 FINAL  Final  Culture, blood (routine x 2)     Status: Abnormal   Collection Time: 12/24/20 11:53 AM   Specimen: BLOOD LEFT HAND   Result Value Ref Range Status   Specimen Description   Final    BLOOD LEFT HAND Performed at [facility] [address] [place], [place] [address]    Special Requests   Final    BOTTLES DRAWN AEROBIC ONLY Blood Culture adequate volume Performed at [facility] [address], [place], [place] [address]    Culture  Setup Time   Final    AEROBIC BOTTLE ONLY GRAM POSITIVE COCCI CRITICAL RESULT CALLED TO, READ BACK BY AND VERIFIED WITH: C. Roseanne Kaufman, AT [id number] 12/25/20 [place] Performed at [facility], [place] [address] [place], [place] [address]    Culture STAPHYLOCOCCUS AUREUS (A)  Final   Report Status 12/27/2020 FINAL  Final   Organism ID, Bacteria STAPHYLOCOCCUS AUREUS  Final      Susceptibility   Staphylococcus aureus - MIC*    CIPROFLOXACIN >=8 RESISTANT Resistant     ERYTHROMYCIN >=8 RESISTANT Resistant     GENTAMICIN <=0.5 SENSITIVE Sensitive     OXACILLIN >=4 RESISTANT Resistant     TETRACYCLINE <=1 SENSITIVE Sensitive     VANCOMYCIN <=0.5 SENSITIVE Sensitive     TRIMETH/SULFA <=10 SENSITIVE Sensitive     CLINDAMYCIN <=0.25 SENSITIVE Sensitive     RIFAMPIN <=0.5 SENSITIVE Sensitive     Inducible Clindamycin NEGATIVE Sensitive     * STAPHYLOCOCCUS AUREUS  Blood Culture ID Panel (Reflexed)     Status: Abnormal   Collection Time: 12/24/20 11:53 AM  Result Value Ref Range Status   Enterococcus faecalis NOT DETECTED NOT DETECTED Final   Enterococcus Faecium NOT DETECTED NOT DETECTED Final   Listeria monocytogenes NOT DETECTED NOT DETECTED Final   Staphylococcus species DETECTED (A) NOT DETECTED Final    Comment: CRITICAL RESULT CALLED TO, READ BACK BY AND VERIFIED WITH: C. SHADE PHARMD, AT [id number] 12/25/20 D. VANHOOK    Staphylococcus aureus (BCID) DETECTED (A) NOT DETECTED Final    Comment: Methicillin (oxacillin)-resistant Staphylococcus aureus (MRSA). MRSA is predictably resistant to beta-lactam antibiotics (except  ceftaroline). Preferred therapy is vancomycin unless clinically contraindicated. Patient requires contact precautions if  hospitalized. CRITICAL RESULT CALLED TO, READ BACK BY AND VERIFIED WITH: C. SHADE PHARMD, AT [id number] 12/25/20 D. VANHOOK    Staphylococcus epidermidis NOT DETECTED NOT DETECTED Final   Staphylococcus lugdunensis NOT DETECTED NOT DETECTED Final   Streptococcus species NOT DETECTED NOT DETECTED Final   Streptococcus agalactiae NOT DETECTED NOT DETECTED Final   Streptococcus pneumoniae NOT DETECTED NOT DETECTED Final   Streptococcus pyogenes NOT DETECTED NOT DETECTED Final   A.calcoaceticus-baumannii NOT DETECTED NOT DETECTED Final  Bacteroides fragilis NOT DETECTED NOT DETECTED Final   Enterobacterales NOT DETECTED NOT DETECTED Final   Enterobacter cloacae complex NOT DETECTED NOT DETECTED Final   Escherichia coli NOT DETECTED NOT DETECTED Final   Klebsiella aerogenes NOT DETECTED NOT DETECTED Final   Klebsiella oxytoca NOT DETECTED NOT DETECTED Final   Klebsiella pneumoniae NOT DETECTED NOT DETECTED Final   Proteus species NOT DETECTED NOT DETECTED Final   Salmonella species NOT DETECTED NOT DETECTED Final   Serratia marcescens NOT DETECTED NOT DETECTED Final   Haemophilus influenzae NOT DETECTED NOT DETECTED Final   Neisseria meningitidis NOT DETECTED NOT DETECTED Final   Pseudomonas aeruginosa NOT DETECTED NOT DETECTED Final   Stenotrophomonas maltophilia NOT DETECTED NOT DETECTED Final   Candida albicans NOT DETECTED NOT DETECTED Final   Candida auris NOT DETECTED NOT DETECTED Final   Candida glabrata NOT DETECTED NOT DETECTED Final   Candida krusei NOT DETECTED NOT DETECTED Final   Candida parapsilosis NOT DETECTED NOT DETECTED Final   Candida tropicalis NOT DETECTED NOT DETECTED Final   Cryptococcus neoformans/gattii NOT DETECTED NOT DETECTED Final   Meth resistant mecA/C and MREJ DETECTED (A) NOT DETECTED Final    Comment: CRITICAL RESULT CALLED TO, READ BACK BY  AND VERIFIED WITH: C. Roseanne Kaufman, AT [id number] 12/25/20 Rush Landmark Performed at [facility], [place] [address], [place], [place] [address]   Culture, blood (routine x 2)     Status: None (Preliminary result)   Collection Time: 12/27/20  5:52 PM   Specimen: BLOOD LEFT HAND  Result Value Ref Range Status   Specimen Description   Final    BLOOD LEFT HAND Performed at [facility] [address], [place], [place] [address]    Special Requests   Final    BOTTLES DRAWN AEROBIC ONLY Blood Culture results may not be optimal due to an inadequate volume of blood received in culture bottles Performed at [facility] [address], [place], [place] [address]    Culture   Final    NO GROWTH 2 DAYS Performed at [facility] [address], [place], [place] [address]    Report Status PENDING  Incomplete    Impression/Plan:  1. Septic sacroiliac joint - positive MRSA in blood cultures and noted the SI joint inflammation on CT.  Aspiration done today with growing area on repeat CT and most c/w phlegmon.  No changes in antibiotics and will remain on daptomycin for a prolonged period, 6 weeks at least through June 27th.    2.  MRSA bacteremia - repeat blood culture ngtd from 5/9.  TEE without vegetation noted.   Treatment as above.  3.  Disposition - OPAT previously placed and as above, daptomycin trhough 02/14/21.

## 2020-12-29 NOTE — Progress Notes (Signed)
Referring Physician(s): Ubly  Supervising Physician: Jacqulynn Cadet  Patient Status:  [facility] - In-pt  Chief Complaint:  Left hip/flank/leg pain  Subjective: Patient familiar to IR service from CT-guided aspiration of posterior left sacroiliac joint on 12/20/20.  Cultures grew MRSA.  He is a 60 year old female with history of chronic knee pain and asthma who presented to the ED at [facility] with complaint of worsening left hip pain, inability to ambulate due to pain.  Follow-up imaging has revealed hypoattenuating area within the left iliac muscle containing punctate foci of gas measuring up to 6 cm and concerning for phlegmon secondary to adjacent left SI joint infection.  Request now received from primary care team for aspiration possible drainage of this area for further evaluation.  Patient currently denies fever, chest pain, dyspnea, cough, abdominal pain, nausea, vomiting or bleeding. Past Medical History:  Diagnosis Date  . Amenorrhea    irreg cycle  . Anxiety   . Arthritis   . Asthma   . MRSA infection   . Obesity    Past Surgical History:  Procedure Laterality Date  . COLONOSCOPY WITH PROPOFOL N/A 01/08/2014   Procedure: COLONOSCOPY WITH PROPOFOL;  Surgeon: [name], MD;  Location: [facility] ENDOSCOPY;  Service: Endoscopy;  Laterality: N/A;  . DILATION AND CURETTAGE OF UTERUS  1990  . LAPAROSCOPIC CHOLECYSTECTOMY  1994  . mrsa     had to have area cut out. abdomen      Allergies: Patient has no known allergies.  Medications: Prior to Admission medications   Medication Sig Start Date End Date Taking? Authorizing Provider  ALPRAZolam Duanne Moron) 0.5 MG tablet Take 1 mg by mouth at bedtime as needed for sleep. For anxiety   Yes [provider]  Calcium Carbonate-Vitamin D (CALCIUM 600+D3 PO) Take 1 tablet by mouth 2 (two) times daily.   Yes [provider]  citalopram (CELEXA) 40 MG tablet Take 40 mg by mouth every morning.   Yes  [provider]  clobetasol cream (TEMOVATE) 2.70 % Apply 1 application topically at bedtime. 10/18/20  Yes [provider]  fish oil-omega-3 fatty acids 1000 MG capsule Take 1 g by mouth 2 (two) times daily.   Yes [provider]  gabapentin (NEURONTIN) 100 MG capsule Take 300 mg by mouth at bedtime. 11/11/20  Yes [provider]  Glucosamine-Chondroit-Vit C-Mn (GLUCOSAMINE 1500 COMPLEX PO) Take 1 capsule by mouth 2 (two) times daily.   Yes [provider]  meloxicam (MOBIC) 15 MG tablet Take 15 mg by mouth daily.   Yes [provider]  methocarbamol (ROBAXIN) 750 MG tablet Take 750 mg by mouth at bedtime.   Yes [provider]  montelukast (SINGULAIR) 10 MG tablet Take 10 mg by mouth every morning.  10/19/13  Yes [provider]  Multiple Vitamin (MULTIVITAMIN WITH MINERALS) TABS tablet Take 1 tablet by mouth daily.   Yes [provider]  omeprazole (PRILOSEC) 20 MG capsule Take 1 capsule by mouth daily. 11/20/20  Yes [provider]  oxyCODONE-acetaminophen (PERCOCET) 10-325 MG per tablet Take 1 tablet by mouth every 6 (six) hours as needed for pain.  10/25/13  Yes [provider]  vitamin B-12 (CYANOCOBALAMIN) 1000 MCG tablet Take 1,000 mcg by mouth daily.   Yes [provider]  Fluticasone-Salmeterol (ADVAIR) 250-50 MCG/DOSE AEPB Inhale 2 puffs into the lungs 2 (two) times daily as needed (sob/wheezing).    [provider]     Vital Signs: BP Marland Kitchen)  141/72 (BP Location: Left Arm)   Pulse 80   Temp 99 F (37.2 C)   Resp (!) 22   Ht 5' 8"  (1.727 m)   Wt 235 lb 0.2 oz (106.6 kg)   LMP 02/18/2014   SpO2 97%   BMI 35.73 kg/m   Physical Exam awake, alert.  Chest clear to auscultation bilaterally.  Heart with regular rate and rhythm.  Abdomen soft, positive bowel sounds, currently nontender.  No significant lower extremity edema.  Patient does have some tenderness along the left  hip/flank region  Imaging: CT HIP LEFT W CONTRAST  Result Date: 12/28/2020 CLINICAL DATA:  Septic arthritis suspected, hip, no prior imaging EXAM: CT OF THE LOWER LEFT EXTREMITY WITH CONTRAST TECHNIQUE: Multidetector CT imaging of the lower left extremity was performed according to the standard protocol following intravenous contrast administration. CONTRAST:  123m OMNIPAQUE IOHEXOL 300 MG/ML  SOLN COMPARISON:  MRI left hip 12/19/2020 FINDINGS: Bones/Joint/Cartilage There is no evidence of fracture. Normal alignment. There is mild of moderate left hip degenerative change. There is enthesophyte formation along the greater trochanter and adjacent heterotopic ossification compatible with chronic gluteal tendinopathy. Similar findings along the ischial compatible with chronic hamstrings tendinopathy. There is mild degenerative change of the left SI joint with mild SI joint widening superiorly, partially visualized. There is no new osseous destruction. Ligaments Suboptimally assessed by CT. Muscles and Tendons There is a hypoattenuating region within the left iliac muscle measuring 6.0 x 3.1 x 1.6 cm with punctate foci of gas (soft tissue sagittal image 78, axial image 12). There is moderate gluteus minimus and medius muscle atrophy. Soft tissues No adenopathy. IMPRESSION: Hypoattenuating area within the left iliac muscle containing punctate foci of gas, measuring 6.0 x 3.1 x 1.6 cm, concerning for developing phlegmon secondary to an adjacent left sacroiliac joint infection. No new osseous erosion. No significant left hip joint effusion. 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 Electronically Signed   By: JMaurine Simmering  On: 12/28/2020 08:48   ECHO TEE  Result Date: 12/27/2020    TRANSESOPHOGEAL ECHO REPORT   [name]2020 Medical Rec #:  [id number]       Height:       68.0 in Accession #:    [id number]      Weight:        246.7 lb Date of Birth:  4May 15, 1962        BSA:          2.234 m Patient Age:    89years         BP:           152/66 mmHg Patient Gender: F                HR:           100 bpm. Exam Location:  Inpatient Procedure: Transesophageal Echo, Cardiac Doppler and Color Doppler Indications:     Bacteremia  History:         Patient has prior history of Echocardiogram examinations, most                  recent 12/22/2020. Signs/Symptoms:Fever and Bacteremia; Risk                  Factors:Obesity. MRSA.  Sonographer:     BDustin FlockReferring Phys:  [id number] KailDiagnosing Phys: HGwyndolyn KaufmanMD PROCEDURE: The  transesophogeal probe was passed without difficulty through the esophogus of the patient. Sedation performed by performing physician. Patients was under conscious sedation during this procedure. Anesthetic administered: 152mg of Fentanyl,  9.061mof Versed. The patient developed no complications during the procedure. IMPRESSIONS  1. The aortic valve is tricuspid with moderate leaflet thickening and calcification. There is also nodular commisural calcification which is highly echogenic. This is most consistent with calcification with low suspicion for valvular vegetations. There is moderate aortic stenosis with AVA 1.1cm2, mean gradient 3457m, peak gradient 78m227m Vmax 3.27m/s64mI 0.3. Trivial aortic regurgitation.  2. Left ventricular ejection fraction, by estimation, is 60 to 65%. The left ventricle has normal function.  3. Right ventricular systolic function is normal. The right ventricular size is normal.  4. No left atrial/left atrial appendage thrombus was detected.  5. The mitral valve is normal in structure. Trivial mitral valve regurgitation. No evidence of mitral stenosis.  6. There is mild (Grade II) plaque.  7. No distinct valvular vegetations visualized on TEE. FINDINGS  Left Ventricle: Left ventricular ejection fraction, by estimation, is 60 to 65%. The left ventricle has normal  function. The left ventricular internal cavity size was normal in size. Right Ventricle: The right ventricular size is normal. No increase in right ventricular wall thickness. Right ventricular systolic function is normal. Left Atrium: Left atrial size was normal in size. No left atrial/left atrial appendage thrombus was detected. Right Atrium: Right atrial size was normal in size. Pericardium: There is no evidence of pericardial effusion. Mitral Valve: The mitral valve is normal in structure. There is mild thickening of the mitral valve leaflet(s). There is mild calcification of the mitral valve leaflet(s). Mild to moderate mitral annular calcification. Trivial mitral valve regurgitation.  No evidence of mitral valve stenosis. Tricuspid Valve: The tricuspid valve is normal in structure. Tricuspid valve regurgitation is trivial. Aortic Valve: The aortic valve is tricuspid with moderate leaflet thickening and calcification. There is also nodular, commisural calcification noted which is very echogenic. This is most consistent with calcification with low suspicion for valvular vegetations. There is moderate aortic stenosis with AVA 1.1cm2, mean gradient 34mmH927meak gradient 78mmHg31max 3.27m/s, D627m.3. Trivial aortic regurgitation. Pulmonic Valve: The pulmonic valve was normal in structure. Pulmonic valve regurgitation is trivial. Aorta: The aortic root and ascending aorta are structurally normal, with no evidence of dilitation. There is mild (Grade II) plaque. IAS/Shunts: No atrial level shunt detected by color flow Doppler.  AORTIC VALVE AV Vmax:           387.00 cm/s AV Vmean:          274.000 cm/s AV VTI:            0.751 m AV Peak Grad:      59.9 mmHg AV Mean Grad:      34.0 mmHg LVOT Vmax:         116.00 cm/s LVOT Vmean:        79.300 cm/s LVOT VTI:          0.255 m LVOT/AV VTI ratio: 0.34  SHUNTS Systemic VTI: 0.26 m Heather Gwyndolyn Kaufmantronically signed by Heather Gwyndolyn Kaufmanature Date/Time:  12/27/2020/4:04:25 PM    Final     Labs:  CBC: Recent Labs    12/24/20 0401 12/25/20 0346 12/26/20 0305 12/29/20 0327  WBC 18.2* 16.3* 14.6* 8.9  HGB 11.3* 10.6* 10.0* 9.5*  HCT 34.9* 33.6* 31.6* 30.2*  PLT 309 363 373 366    COAGS: No  results for input(s): INR, APTT in the last 8760 hours.  BMP: Recent Labs    12/24/20 0401 12/25/20 0346 12/26/20 0305 12/29/20 0327  NA 137 144 141 140  K 3.2* 3.1* 3.8 4.1  CL 98 103 101 103  CO2 27 30 32 28  GLUCOSE 121* 103* 105* 112*  BUN 11 13 10 10   CALCIUM 9.2 9.4 8.9 9.1  CREATININE 0.56 0.63 0.57 0.34*  GFRNONAA >60 >60 >60 >60    LIVER FUNCTION TESTS: Recent Labs    12/18/20 1105 12/19/20 0751  BILITOT 0.9 0.8  AST 15 25  ALT 16 25  ALKPHOS 63 69  PROT 7.6 8.0  ALBUMIN 4.2 3.4*    Assessment and Plan: Patient familiar to IR service from CT-guided aspiration of posterior left sacroiliac joint on 12/20/20.  Cultures grew MRSA.  He is a 60 year old female with history of chronic knee pain and asthma who presented [facility] ED at [facility] with complaint of worsening left hip pain, inability to ambulate due to pain.  Follow-up imaging has revealed hypoattenuating area within the left iliac muscle containing punctate foci of gas measuring up to 6 cm and concerning for phlegmon secondary to adjacent left SI joint infection.  Request now received from primary care team for aspiration/ possible drainage of this area for further evaluation.  Imaging studies have been reviewed by Dr. Laurence Ferrari.  Details/risks of procedure, including not limited to, internal bleeding, infection, injury to adjacent structures discussed with patient with her understanding and consent.  Procedure scheduled for today   Electronically Signed: D. Rowe Robert, PA-C 12/29/2020, 8:20 AM   I spent a total of 25 minutes at the the patient's bedside AND on the patient's hospital floor or unit, greater than 50% of which was counseling/coordinating  care for CT-guided aspiration possible drainage of left iliac muscle/psoas region fluid collection    Patient ID: BLAKELEE ALLINGTON, female   DOB: 23-Sep-1960, 60 y.o.   MRN: [id number]

## 2020-12-29 NOTE — Progress Notes (Signed)
PT Cancellation Note  Patient Details Name: HARMAN FERRIN MRN: [id number] DOB: 1961/03/13   Cancelled Treatment:    Reason Eval/Treat Not Completed: Patient at procedure or test/unavailable. Pt gone for CT guided aspiration. Will check back as schedule permits.    Talbot Grumbling PT, DPT 12/29/20, 9:43 AM

## 2020-12-29 NOTE — Progress Notes (Addendum)
PROGRESS NOTE    Nicole Hines  AVW:[id number] DOB: [birth year] DOA: 12/18/2020 PCP: Creola Corn, MD   Brief Narrative:   60 year old female with history of chronic knee pain, asthma who presented to the emergency department at [facility] with complaint of worsening left hip pain, inability to ambulate due to pain.  In the emergency department she was found to have fever, lab work showed leukocytosis.  MRI revealed left septic sacroiliac joint.  Orthopedics, IR , ID consulted.  Underwent  CT-guided aspiration of left on 12/20/20.  Blood culture showed MRSA,on daptomycin.Underwent  TTE on on 12/28/2018 without finding of any vegetations.  ID following.  Hospital course remarkable for persistent left hip pain.Follow-up CT hip showed  6.0 x 3.1 x 1.6 cm area , concerning for developing phlegmon in the  left iliac muscle secondary to an adjacent left sacroiliac joint infection.  Orthopedic/IR consulted    Assessment & Plan:   Principal Problem:   MRSA (methicillin resistant Staphylococcus aureus) carrier Active Problems:   SIRS (systemic inflammatory response syndrome) (HCC)   Left hip pain   Septic joint (HCC)   Chronic SI joint pain   Fever   Acute bacterial endocarditis   Left septic sacroiliac joint; MRSA  Presented with fever, left hip pain.  MRI of the lumbar spine showed left septic sacroiliac joint.  IR, orthopedics, ID consulted on admission. Underwent  CT-guided aspiration of left hip on 12/20/20. Final report of  joint fluid culture showed MRSA.  Patient on daptomycin Hospital course remarkable for persistent left hip pain.Follow-up CT hip showed  6.0 x 3.1 x 1.6 cm area ,concerning for developing phlegmon in the  left iliac muscle secondary to an adjacent left sacroiliac joint infection.   Ortho consulted IR unable to place a drain, no obvious fluid collection.   MRSA bacteremia: persistent -Currently on Daptomycin, ID is following. Plan to continue until 02/14/21 TTE  and TEE neg for Vegetation.  PICC removed.  Cultures from 5-9= NGTD  Hypertension:  Norvasc 5mg  po daily.   Hypokalemia -replete prn  Chronic pain syndrome:  -Continue supportive care, pain medications.  Patient takes high-dose oxycodone at home on scheduled basis.  History of asthma:  -Currently saturating fine on room air.  No wheezing. Bronchodilators prn  Debility/deconditioning/left hip pain:   PT/OT recommended HH on discharge.  Morbid obesity: BMI of 36.6      DVT prophylaxis: Lovenox Code Status: Full Family Communication:  Nelida Gores- she is updated.   Status is: Inpatient  Remains inpatient appropriate because:IV treatments appropriate due to intensity of illness or inability to take PO   Dispo: The patient is from: Home              Anticipated d/c is to: Home              Patient currently is not medically stable to d/c. Waiting patient to be cleared by ID and get better pain control. Will need to set up [facility] for Abx.    Difficult to place patient No    Subjective: Doing ok, does have right hip pain.   Review of Systems Otherwise negative except as per HPI, including: General: Denies fever, chills, night sweats or unintended weight loss. Resp: Denies cough, wheezing, shortness of breath. Cardiac: Denies chest pain, palpitations, orthopnea, paroxysmal nocturnal dyspnea. GI: Denies abdominal pain, nausea, vomiting, diarrhea or constipation GU: Denies dysuria, frequency, hesitancy or incontinence MS: Denies muscle aches, joint pain or swelling Neuro: Denies headache,  neurologic deficits (focal weakness, numbness, tingling), abnormal gait Psych: Denies anxiety, depression, SI/HI/AVH Skin: Denies new rashes or lesions ID: Denies sick contacts, exotic exposures, travel  Examination:  General exam: Appears calm and comfortable  Respiratory system: Clear to auscultation. Respiratory effort normal. Cardiovascular system: S1 & S2 heard, RRR. No  JVD, murmurs, rubs, gallops or clicks. No pedal edema. Gastrointestinal system: Abdomen is nondistended, soft and nontender. No organomegaly or masses felt. Normal bowel sounds heard. Central nervous system: Alert and oriented. No focal neurological deficits. Extremities: Symmetric 5 x 5 power. Skin: No rashes, lesions or ulcers Psychiatry: Judgement and insight appear normal. Mood & affect appropriate.     Objective: Vitals:   12/28/20 2030 12/28/20 2039 12/29/20 0522 12/29/20 0716  BP: (!) 160/87  (!) 141/72   Pulse: 78  80   Resp: 20  (!) 22   Temp: 98.5 F (36.9 C)  99 F (37.2 C)   TempSrc: Oral     SpO2: (!) 88% 95% 96% 97%  Weight:      Height:        Intake/Output Summary (Last 24 hours) at 12/29/2020 0808 Last data filed at 12/29/2020 0600 Gross per 24 hour  Intake 204 ml  Output --  Net 204 ml   Filed Weights   12/26/20 0500 12/27/20 1321 12/28/20 1337  Weight: 111.9 kg 111.9 kg 106.6 kg     Data Reviewed:   CBC: Recent Labs  Lab 12/23/20 0452 12/24/20 0401 12/25/20 0346 12/26/20 0305 12/29/20 0327  WBC 16.6* 18.2* 16.3* 14.6* 8.9  NEUTROABS 12.9* 13.4* 11.9* 10.8* 6.2  HGB 11.6* 11.3* 10.6* 10.0* 9.5*  HCT 35.4* 34.9* 33.6* 31.6* 30.2*  MCV 91.9 93.6 95.2 94.3 95.6  PLT 261 309 363 373 366   Basic Metabolic Panel: Recent Labs  Lab 12/23/20 0452 12/24/20 0401 12/25/20 0346 12/26/20 0305 12/29/20 0327  NA 137 137 144 141 140  K 3.2* 3.2* 3.1* 3.8 4.1  CL 100 98 103 101 103  CO2 27 27 30  32 28  GLUCOSE 119* 121* 103* 105* 112*  BUN 13 11 13 10 10   CREATININE 0.41* 0.56 0.63 0.57 0.34*  CALCIUM 9.3 9.2 9.4 8.9 9.1   GFR: Estimated Creatinine Clearance: 96.8 mL/min (A) (by C-G formula based on SCr of 0.34 mg/dL (L)). Liver Function Tests: No results for input(s): AST, ALT, ALKPHOS, BILITOT, PROT, ALBUMIN in the last 168 hours. No results for input(s): LIPASE, AMYLASE in the last 168 hours. No results for input(s): AMMONIA in the last 168  hours. Coagulation Profile: No results for input(s): INR, PROTIME in the last 168 hours. Cardiac Enzymes: Recent Labs  Lab 12/25/20 0346  CKTOTAL 17*   BNP (last 3 results) No results for input(s): PROBNP in the last 8760 hours. HbA1C: No results for input(s): HGBA1C in the last 72 hours. CBG: No results for input(s): GLUCAP in the last 168 hours. Lipid Profile: No results for input(s): CHOL, HDL, LDLCALC, TRIG, CHOLHDL, LDLDIRECT in the last 72 hours. Thyroid Function Tests: No results for input(s): TSH, T4TOTAL, FREET4, T3FREE, THYROIDAB in the last 72 hours. Anemia Panel: No results for input(s): VITAMINB12, FOLATE, FERRITIN, TIBC, IRON, RETICCTPCT in the last 72 hours. Sepsis Labs: No results for input(s): PROCALCITON, LATICACIDVEN in the last 168 hours.  Recent Results (from the past 240 hour(s))  Aerobic/Anaerobic Culture (surgical/deep wound)     Status: None   Collection Time: 12/20/20 10:02 AM   Specimen: Abscess  Result Value Ref Range Status  Specimen Description   Final    ABSCESS LT SI JOINT ASPIRATION Performed at [facility], [address] [place], [place] [address]    Special Requests   Final    NONE Performed at [facility], [address], [place], [place] [address]    Gram Stain NO WBC SEEN RARE GRAM POSITIVE COCCI   Final   Culture   Final    FEW METHICILLIN RESISTANT STAPHYLOCOCCUS AUREUS NO ANAEROBES ISOLATED Performed at [facility], [address], [place], [place] [address]    Report Status 12/26/2020 FINAL  Final   Organism ID, Bacteria METHICILLIN RESISTANT STAPHYLOCOCCUS AUREUS  Final      Susceptibility   Methicillin resistant staphylococcus aureus - MIC*    CIPROFLOXACIN >=8 RESISTANT Resistant     ERYTHROMYCIN >=8 RESISTANT Resistant     GENTAMICIN <=0.5 SENSITIVE Sensitive     OXACILLIN >=4 RESISTANT Resistant     TETRACYCLINE <=1 SENSITIVE Sensitive     VANCOMYCIN <=0.5 SENSITIVE  Sensitive     TRIMETH/SULFA <=10 SENSITIVE Sensitive     CLINDAMYCIN <=0.25 SENSITIVE Sensitive     RIFAMPIN <=0.5 SENSITIVE Sensitive     Inducible Clindamycin NEGATIVE Sensitive     * FEW METHICILLIN RESISTANT STAPHYLOCOCCUS AUREUS  Culture, blood (routine x 2)     Status: None   Collection Time: 12/21/20 12:09 PM   Specimen: BLOOD  Result Value Ref Range Status   Specimen Description   Final    BLOOD LEFT ANTECUBITAL Performed at [facility], [address], [place], [place] [address]    Special Requests   Final    BOTTLES DRAWN AEROBIC AND ANAEROBIC Blood Culture results may not be optimal due to an excessive volume of blood received in culture bottles   Culture   Final    NO GROWTH 5 DAYS Performed at [facility], [address], [place], [place] [address]    Report Status 12/26/2020 FINAL  Final  Culture, blood (routine x 2)     Status: None   Collection Time: 12/21/20 12:20 PM   Specimen: BLOOD RIGHT HAND  Result Value Ref Range Status   Specimen Description   Final    BLOOD RIGHT HAND Performed at [facility], [address] [place], [place] [address]    Special Requests   Final    BOTTLES DRAWN AEROBIC AND ANAEROBIC Blood Culture adequate volume Performed at [facility], [address], [place], [place] [address]    Culture   Final    NO GROWTH 5 DAYS Performed at [facility], [address] [place], [place] [address]    Report Status 12/26/2020 FINAL  Final  Culture, blood (routine x 2)     Status: Abnormal   Collection Time: 12/24/20 11:53 AM   Specimen: BLOOD LEFT HAND  Result Value Ref Range Status   Specimen Description   Final    BLOOD LEFT HAND Performed at [facility], [address], [place], [place] [address]    Special Requests   Final    BOTTLES DRAWN AEROBIC ONLY Blood Culture adequate volume Performed at [facility], [address], [place], [place] [address]    Culture  Setup Time   Final    AEROBIC BOTTLE ONLY GRAM POSITIVE COCCI CRITICAL RESULT CALLED TO, READ BACK BY AND VERIFIED WITH: C. Kerry Kass, AT [id number] 12/25/20 Renato Shin Performed at [facility], [address], [place], [place] [address]    Culture STAPHYLOCOCCUS AUREUS (A)  Final   Report Status 12/27/2020 FINAL  Final   Organism ID, Bacteria STAPHYLOCOCCUS AUREUS  Final      Susceptibility   Staphylococcus aureus - MIC*    CIPROFLOXACIN >=8 RESISTANT Resistant     ERYTHROMYCIN >=8 RESISTANT Resistant     GENTAMICIN <=0.5 SENSITIVE Sensitive     OXACILLIN >=4 RESISTANT Resistant     TETRACYCLINE <=1 SENSITIVE Sensitive     VANCOMYCIN <=0.5 SENSITIVE Sensitive     TRIMETH/SULFA <=10 SENSITIVE Sensitive     CLINDAMYCIN <=0.25 SENSITIVE Sensitive     RIFAMPIN <=0.5 SENSITIVE Sensitive     Inducible Clindamycin NEGATIVE Sensitive     * STAPHYLOCOCCUS AUREUS  Blood Culture ID Panel (Reflexed)     Status: Abnormal   Collection Time: 12/24/20 11:53 AM  Result Value Ref Range Status   Enterococcus faecalis NOT DETECTED NOT DETECTED Final   Enterococcus Faecium NOT DETECTED NOT DETECTED Final   Listeria monocytogenes NOT DETECTED NOT DETECTED Final   Staphylococcus species DETECTED (A) NOT DETECTED Final    Comment: CRITICAL RESULT CALLED TO, READ BACK BY AND VERIFIED WITH: C. SHADE PHARMD, AT [id number] 12/25/20 D. VANHOOK    Staphylococcus aureus (BCID) DETECTED (A) NOT DETECTED Final    Comment: Methicillin (oxacillin)-resistant Staphylococcus aureus (MRSA). MRSA is predictably resistant to beta-lactam antibiotics (except ceftaroline). Preferred therapy is vancomycin unless clinically contraindicated. Patient requires contact precautions if  hospitalized. CRITICAL RESULT CALLED TO, READ BACK BY AND VERIFIED WITH: C. SHADE PHARMD, AT [id number] 12/25/20 D. VANHOOK    Staphylococcus epidermidis NOT DETECTED NOT DETECTED Final   Staphylococcus lugdunensis NOT  DETECTED NOT DETECTED Final   Streptococcus species NOT DETECTED NOT DETECTED Final   Streptococcus agalactiae NOT DETECTED NOT DETECTED Final   Streptococcus pneumoniae NOT DETECTED NOT DETECTED Final   Streptococcus pyogenes NOT DETECTED NOT DETECTED Final   A.calcoaceticus-baumannii NOT DETECTED NOT DETECTED Final   Bacteroides fragilis NOT DETECTED NOT DETECTED Final   Enterobacterales NOT DETECTED NOT DETECTED Final   Enterobacter cloacae complex NOT DETECTED NOT DETECTED Final   Escherichia coli NOT DETECTED NOT DETECTED Final   Klebsiella aerogenes NOT DETECTED NOT DETECTED Final   Klebsiella oxytoca NOT DETECTED NOT DETECTED Final   Klebsiella pneumoniae NOT DETECTED NOT DETECTED Final   Proteus species NOT DETECTED NOT DETECTED Final   Salmonella species NOT DETECTED NOT DETECTED Final   Serratia marcescens NOT DETECTED NOT DETECTED Final   Haemophilus influenzae NOT DETECTED NOT DETECTED Final   Neisseria meningitidis NOT DETECTED NOT DETECTED Final   Pseudomonas aeruginosa NOT DETECTED NOT DETECTED Final   Stenotrophomonas maltophilia NOT DETECTED NOT DETECTED Final   Candida albicans NOT DETECTED NOT DETECTED Final   Candida auris NOT DETECTED NOT DETECTED Final   Candida glabrata NOT DETECTED NOT DETECTED Final   Candida krusei NOT DETECTED NOT DETECTED Final   Candida parapsilosis NOT DETECTED NOT DETECTED Final   Candida tropicalis NOT DETECTED NOT DETECTED Final   Cryptococcus neoformans/gattii NOT DETECTED NOT DETECTED Final   Meth resistant mecA/C and MREJ DETECTED (A) NOT DETECTED Final    Comment: CRITICAL RESULT CALLED TO, READ BACK BY AND VERIFIED WITH: C. Kerry Kass, AT 1191 12/25/20 Renato Shin Performed at [facility], [address] [place], [place] [address]   Culture, blood (routine x 2)     Status: None (Preliminary result)   Collection Time: 12/27/20  5:52  PM   Specimen: BLOOD LEFT HAND  Result Value Ref Range Status   Specimen Description    Final    BLOOD LEFT HAND Performed at [facility], [address] [place], [place] [address]    Special Requests   Final    BOTTLES DRAWN AEROBIC ONLY Blood Culture results may not be optimal due to an inadequate volume of blood received in culture bottles Performed at [facility], [address], [place], [place] [address]    Culture   Final    NO GROWTH < 12 HOURS Performed at [facility], [address] [place], [place] [address]    Report Status PENDING  Incomplete         Radiology Studies: CT HIP LEFT W CONTRAST  Result Date: 12/28/2020 CLINICAL DATA:  Septic arthritis suspected, hip, no prior imaging EXAM: CT OF THE LOWER LEFT EXTREMITY WITH CONTRAST TECHNIQUE: Multidetector CT imaging of the lower left extremity was performed according to the standard protocol following intravenous contrast administration. CONTRAST:  OMNIPAQUE IOHEXOL 300 MG/ML  SOLN COMPARISON:  MRI left hip 12/19/2020 FINDINGS: Bones/Joint/Cartilage There is no evidence of fracture. Normal alignment. There is mild of moderate left hip degenerative change. There is enthesophyte formation along the greater trochanter and adjacent heterotopic ossification compatible with chronic gluteal tendinopathy. Similar findings along the ischial compatible with chronic hamstrings tendinopathy. There is mild degenerative change of the left SI joint with mild SI joint widening superiorly, partially visualized. There is no new osseous destruction. Ligaments Suboptimally assessed by CT. Muscles and Tendons There is a hypoattenuating region within the left iliac muscle measuring 6.0 x 3.1 x 1.6 cm with punctate foci of gas (soft tissue sagittal image 78, axial image 12). There is moderate gluteus minimus and medius muscle atrophy. Soft tissues No adenopathy. IMPRESSION: Hypoattenuating area within the left iliac muscle containing punctate foci of gas, measuring 6.0 x 3.1 x 1.6 cm,  concerning for developing phlegmon secondary to an adjacent left sacroiliac joint infection. No new osseous erosion. No significant left hip joint effusion. 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 Electronically Signed   By: Caprice Renshaw   On: 12/28/2020 08:48   ECHO TEE  Result Date: 12/27/2020    TRANSESOPHOGEAL ECHO REPORT   Patient Name:   THERA ROSEKRANS [facility] Date of Exam: 12/27/2020 Medical Rec #:  [id number]        Height:       68.0 in Accession #:    [id number]       Weight:       246.7 lb Date of Birth:  Aug 13, 1961         BSA:          2.234 m Patient Age:    60 years         BP:           152/66 mmHg Patient Gender: F                HR:           100 bpm. Exam Location:  Inpatient Procedure: Transesophageal Echo, Cardiac Doppler and Color Doppler Indications:     Bacteremia  History:         Patient has prior history of Echocardiogram examinations, most                  recent 12/22/2020. Signs/Symptoms:Fever and Bacteremia; Risk  Factors:Obesity. MRSA.  Sonographer:     Lavenia Atlas Referring Phys:  [id number] Manson Passey Diagnosing Phys: Laurance Flatten MD PROCEDURE: The transesophogeal probe was passed without difficulty through the esophogus of the patient. Sedation performed by performing physician. Patients was under conscious sedation during this procedure. Anesthetic administered: of Fentanyl,  9.0mg  of Versed. The patient developed no complications during the procedure. IMPRESSIONS  1. The aortic valve is tricuspid with moderate leaflet thickening and calcification. There is also nodular commisural calcification which is highly echogenic. This is most consistent with calcification with low suspicion for valvular vegetations. There is moderate aortic stenosis with AVA 1.1cm2, mean gradient , peak gradient , Vmax 3.73m/s, DI 0.3. Trivial aortic regurgitation.  2. Left ventricular ejection fraction, by  estimation, is 60 to 65%. The left ventricle has normal function.  3. Right ventricular systolic function is normal. The right ventricular size is normal.  4. No left atrial/left atrial appendage thrombus was detected.  5. The mitral valve is normal in structure. Trivial mitral valve regurgitation. No evidence of mitral stenosis.  6. There is mild (Grade II) plaque.  7. No distinct valvular vegetations visualized on TEE. FINDINGS  Left Ventricle: Left ventricular ejection fraction, by estimation, is 60 to 65%. The left ventricle has normal function. The left ventricular internal cavity size was normal in size. Right Ventricle: The right ventricular size is normal. No increase in right ventricular wall thickness. Right ventricular systolic function is normal. Left Atrium: Left atrial size was normal in size. No left atrial/left atrial appendage thrombus was detected. Right Atrium: Right atrial size was normal in size. Pericardium: There is no evidence of pericardial effusion. Mitral Valve: The mitral valve is normal in structure. There is mild thickening of the mitral valve leaflet(s). There is mild calcification of the mitral valve leaflet(s). Mild to moderate mitral annular calcification. Trivial mitral valve regurgitation.  No evidence of mitral valve stenosis. Tricuspid Valve: The tricuspid valve is normal in structure. Tricuspid valve regurgitation is trivial. Aortic Valve: The aortic valve is tricuspid with moderate leaflet thickening and calcification. There is also nodular, commisural calcification noted which is very echogenic. This is most consistent with calcification with low suspicion for valvular vegetations. There is moderate aortic stenosis with AVA 1.1cm2, mean gradient , peak gradient , Vmax 3.73m/s, DI 0.3. Trivial aortic regurgitation. Pulmonic Valve: The pulmonic valve was normal in structure. Pulmonic valve regurgitation is trivial. Aorta: The aortic root and ascending aorta are  structurally normal, with no evidence of dilitation. There is mild (Grade II) plaque. IAS/Shunts: No atrial level shunt detected by color flow Doppler.  AORTIC VALVE AV Vmax:           387.00 cm/s AV Vmean:          274.000 cm/s AV VTI:            0.751 m AV Peak Grad:      59.9 mmHg AV Mean Grad:      34.0 mmHg LVOT Vmax:         116.00 cm/s LVOT Vmean:        79.300 cm/s LVOT VTI:          0.255 m LVOT/AV VTI ratio: 0.34  SHUNTS Systemic VTI: 0.26 m Laurance Flatten MD Electronically signed by Laurance Flatten MD Signature Date/Time: 12/27/2020/4:04:25 PM    Final         Scheduled Meds: . amLODipine  5 mg Oral Daily  . Chlorhexidine Gluconate Cloth  6 each Topical  Daily  . enoxaparin (LOVENOX) injection  50 mg Subcutaneous Q24H  . gabapentin  300 mg Oral QHS  . lidocaine  1 patch Transdermal Q24H  . mometasone-formoterol  2 puff Inhalation BID  . montelukast  10 mg Oral q morning  . pantoprazole  40 mg Oral Daily  . polyethylene glycol  17 g Oral Daily  . senna-docusate  1 tablet Oral BID   Continuous Infusions: . DAPTOmycin (CUBICIN)  IV Stopped (12/28/20 2210)  . methocarbamol (ROBAXIN) IV Stopped (12/27/20 0243)     LOS: 10 days   Time spent= 35 mins    Dayan Desa Joline Maxcy, MD [facility]  If 7PM-7AM, please contact night-coverage  12/29/2020, 8:08 AM

## 2020-12-29 NOTE — Progress Notes (Signed)
PT Cancellation Note  Patient Details Name: Nicole Hines MRN: [id number] DOB: 15-Jul-1961   Cancelled Treatment:    Reason Eval/Treat Not Completed: Pain limiting ability to participate. Pt reports 9/10 pain despite pain medication. Pt reports moving is what makes her pain worse and declines PT at this time. Will continue to progress acutely as able.    Talbot Grumbling PT, DPT 12/29/20, 2:53 PM

## 2020-12-29 NOTE — Procedures (Signed)
Interventional Radiology Procedure Note  Procedure: CT guided aspiration yields no fluid/purulence.  Suspect phlegmon only.    Complications: None  Estimated Blood Loss: None  Recommendations: - No further intervention recommended - Continue Abx  Signed,  Criselda Peaches, MD

## 2020-12-29 NOTE — Plan of Care (Signed)
  Problem: Health Behavior/Discharge Planning: Goal: Ability to manage health-related needs will improve Outcome: Progressing   Problem: Coping: Goal: Level of anxiety will decrease Outcome: Progressing   Problem: Pain Managment: Goal: General experience of comfort will improve Outcome: Progressing   Problem: Safety: Goal: Ability to remain free from injury will improve Outcome: Progressing   Problem: Skin Integrity: Goal: Risk for impaired skin integrity will decrease Outcome: Progressing

## 2020-12-29 NOTE — Progress Notes (Signed)
MEDICATION-RELATED CONSULT NOTE   IR Procedure Consult - Anticoagulant/Antiplatelet PTA/Inpatient Med List Review by Pharmacist    Procedure: CT guided aspiration of psoas abscess     Completed: 12/29/20 at 0900  Post-Procedural bleeding risk per IR MD assessment:  low  Antithrombotic medications on inpatient or PTA profile prior to procedure:     - lovenox 50 mg SQ q24h for VTE prophylaxis  Recommended restart time per IR Post-Procedure Guidelines:   - can resume at least 4 hrs after procedure or at next dose interval   Plan:     - resume lovenox 50 mg SQ q24h at 8pm today - pharmacy will sign off for consult  Dia Sitter, PharmD, BCPS 12/29/2020 9:18 AM

## 2020-12-30 MED ORDER — SENNOSIDES-DOCUSATE SODIUM 8.6-50 MG PO TABS: 1.0000 | ORAL_TABLET | Freq: Two times a day (BID) | ORAL | 0 refills | Status: DC

## 2020-12-30 MED ORDER — NAPROXEN 500 MG PO TABS: 500.0000 mg | ORAL_TABLET | Freq: Two times a day (BID) | ORAL | 0 refills | Status: AC

## 2020-12-30 MED ORDER — METHOCARBAMOL 500 MG PO TABS: 500.0000 mg | ORAL_TABLET | Freq: Three times a day (TID) | ORAL | 0 refills | Status: DC | PRN

## 2020-12-30 MED ORDER — DOCUSATE SODIUM 100 MG PO CAPS: 100.0000 mg | ORAL_CAPSULE | Freq: Every day | ORAL | 2 refills | Status: DC | PRN

## 2020-12-30 MED ORDER — OMEPRAZOLE 20 MG PO CPDR: 1.0000 | DELAYED_RELEASE_CAPSULE | Freq: Every day | ORAL | 0 refills | Status: AC

## 2020-12-30 MED ORDER — POLYETHYLENE GLYCOL 3350 17 G PO PACK: 17.0000 g | PACK | Freq: Every day | ORAL | 2 refills | Status: AC | PRN

## 2020-12-30 MED ORDER — ALTEPLASE 2 MG IJ SOLR
2.0000 mg | Freq: Once | INTRAMUSCULAR | Status: AC
  Administered 2020-12-30: 2 mg
  Filled 2020-12-30: qty 2

## 2020-12-30 MED ORDER — HEPARIN SOD (PORK) LOCK FLUSH 100 UNIT/ML IV SOLN
250.0000 [IU] | INTRAVENOUS | Status: AC | PRN
Start: 2020-12-30 — End: 2020-12-30
  Administered 2020-12-30: 250 [IU]
  Filled 2020-12-30: qty 2.5

## 2020-12-30 MED ORDER — AMLODIPINE BESYLATE 5 MG PO TABS: 5.0000 mg | ORAL_TABLET | Freq: Every day | ORAL | 0 refills | Status: DC

## 2020-12-30 NOTE — Progress Notes (Signed)
VAST consulted to heparin flush RA SL PICC for discharge. Pt stated immediately before my arrival, IV pump kept beeping occluded. Nurse stopped pump. VAST RN flushed PICC with slight difficulty at first; no blood return obtained despite troubleshooting. Cath-Flo ordered and unit RN notified. Pt made aware her discharge will be delayed until we can get Cath-Flo from pharmacy, instill in PICC and wait for 1 - 2 hrs. Pt verbalized understanding.

## 2020-12-30 NOTE — Progress Notes (Signed)
Physical Therapy Treatment Patient Details Name: Nicole Hines MRN: [id number] DOB: 1961-04-16 Today's Date: 12/30/2020    History of Present Illness 60 year old female who presented to the emergency department at [facility] with complaint of worsening left hip pain, inability to ambulate due to pain.  In the emergency department she was found to have fever, lab work showed leukocytosis. CT-guided aspiration of left on 12/20/20; IR unable to place a drain, no obvious fluid collection. MRI revealed left septic sacroiliac joint. Dx of MRSA, SIRS, septic L SIJ. Pt with history of chronic knee pain, asthma    PT Comments    Pt with high pain complaints, agreeable to BLE strengthening  HEP education. Cued pt through BLE exercises, educated on pain tolerable range, reviewed exercises and educated on continued performance and pt verbalized understanding. Reviewed step-to gait pattern with RW, educated pt on using UEs on RW to alleviate weight in L LE for comfort and pt demonstrates appropriately. Pt ambulates 12 ft with RW in room, limited by pain. Will progress acute PT as able.  Follow Up Recommendations  Home health PT     Equipment Recommendations  Rolling walker with 5" wheels;3in1 (PT)    Recommendations for Other Services       Precautions / Restrictions Precautions Precautions: Fall Restrictions Weight Bearing Restrictions: No    Mobility  Bed Mobility  General bed mobility comments: sitting EOB upon arrival    Transfers Overall transfer level: Needs assistance Equipment used: Rolling walker (2 wheeled) Transfers: Sit to/from Stand Sit to Stand: Supervision  General transfer comment: powers to stand with significant pain, maintains trunk flexed forward  Ambulation/Gait Ambulation/Gait assistance: Min guard Gait Distance (Feet): 12 Feet Assistive device: Rolling walker (2 wheeled) Gait Pattern/deviations: Step-through pattern;Antalgic;Decreased weight shift to  left;Decreased step length - right;Decreased step length - left;Trunk flexed Gait velocity: decreased   General Gait Details: very short steps, heavy UE WB on RW, trunk flexed, significantly increased time with pain limiting   Stairs             Wheelchair Mobility    Modified Rankin (Stroke Patients Only)       Balance Overall balance assessment: Needs assistance Sitting-balance support: No upper extremity supported;Feet unsupported Sitting balance-Leahy Scale: Good  Standing balance support: Bilateral upper extremity supported Standing balance-Leahy Scale: Poor Standing balance comment: reliant on UE support to decrease LLE WB     Cognition Arousal/Alertness: Awake/alert Behavior During Therapy: WFL for tasks assessed/performed Overall Cognitive Status: Within Functional Limits for tasks assessed       Exercises General Exercises - Lower Extremity Gluteal Sets: Seated;AROM;Strengthening;Both;10 reps Long Arc Quad: Seated;AROM;Strengthening;Both;10 reps Toe Raises: Seated;AROM;Strengthening;Both;20 reps Heel Raises: Seated;AROM;Strengthening;Both;20 reps    General Comments        Pertinent Vitals/Pain Pain Assessment: 0-10 Pain Score: 8  Pain Location: L SI joint Pain Descriptors / Indicators: Stabbing;Squeezing;Throbbing;Constant Pain Intervention(s): Limited activity within patient's tolerance;Monitored during session;Patient requesting pain meds-RN notified    Home Living                      Prior Function            PT Goals (current goals can now be found in the care plan section) Acute Rehab PT Goals Patient Stated Goal: 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 PT Goal Formulation: With patient Time For Goal Achievement: 01/04/21 Potential to Achieve Goals: Good Progress towards PT goals: Progressing toward  goals (slowly, limited by pain)    Frequency    Min 3X/week      PT Plan Current plan remains  appropriate    Co-evaluation              AM-PAC PT "6 Clicks" Mobility   Outcome Measure  Help needed turning from your back to your side while in a flat bed without using bedrails?: None Help needed moving from lying on your back to sitting on the side of a flat bed without using bedrails?: None Help needed moving to and from a bed to a chair (including a wheelchair)?: A Little Help needed standing up from a chair using your arms (e.g., wheelchair or bedside chair)?: A Little Help needed to walk in hospital room?: A Little Help needed climbing 3-5 steps with a railing? : A Lot 6 Click Score: 19    End of Session   Activity Tolerance: Patient limited by pain Patient left: in bed;with call bell/phone within reach;with bed alarm set;with family/visitor present Nurse Communication: Mobility status;Patient requests pain meds PT Visit Diagnosis: Difficulty in walking, not elsewhere classified (R26.2);Pain Pain - Right/Left: Left Pain - part of body: Hip     Time: 1209-1227 PT Time Calculation (min) (ACUTE ONLY): 18 min  Charges:  $Therapeutic Exercise: 8-22 mins                      Tori Ticia Virgo PT, DPT 12/30/20, 12:56 PM

## 2020-12-30 NOTE — TOC Transition Note (Addendum)
Transition of Care [facility]) - CM/SW Discharge Note   Patient Details  Name: Nicole Hines MRN: [id number] Date of Birth: [birth year]  Transition of Care [facility]) CM/SW Contact:  Dessa Phi, RN Phone Number: 12/30/2020, 10:24 AM   Clinical Narrative:[employer] rep Pam following for iv abx for home/[place] fro HHRN-ongoing teaching,labs,picc flush if needed.Do to patientis insurance & availability of [place] agencies to accept for HHPT-there were none-dtr/patient has been aware for several days. [employer] rep Zach-has already delivered 3n1,rw to rm, will now add shower chair-once ordered. Dtr will decide if she can transport home or if PTAR may be needed. CM can arrange if needed. See prior CM notes for additional info on d/c plans, & resources provided.  1:49p-[employer] has made arrangements with dtr mary for shipment of shower stool to home-all agreed to this plan.Dtr to transport home on own. No further CM needs.      Final next level of care: [place] Barriers to Discharge: No Barriers Identified   Patient Goals and CMS Choice Patient states their goals for this hospitalization and ongoing recovery are:: go home CMS Medicare.gov Compare Post Acute Care list provided to:: Patient Choice offered to / list presented to : Patient  Discharge Placement                       Discharge Plan and Services   Discharge Planning Services: CM Consult Post Acute Care Choice: Home Health          DME Arranged: 3-N-1,Shower stool,Walker rolling DME Agency: [employer] Date DME Agency Contacted: 12/30/20 Time DME Agency Contacted: [id number] Representative spoke with at DME Agency: Thedore Mins HH Arranged: RN,IV Antibiotics HH Agency: [employer] Date [place]: 12/30/20 Time [place]: 1024 Representative spoke with at [place]: pam  Social Determinants of Health ([place]) Interventions     Readmission Risk Interventions No flowsheet data found.

## 2020-12-30 NOTE — Discharge Summary (Signed)
Physician Discharge Summary  Nicole Hines MHD:[id number] DOB: 13-Jul-1961 DOA: 12/18/2020  PCP: [name], MD  Admit date: 12/18/2020 Discharge date: 12/30/2020  Admitted From: Home Disposition:  Home  Recommendations for Outpatient Follow-up:  1. Follow up with PCP in 1-2 weeks 2. Please obtain BMP/CBC in one week your next doctors visit.  3. Daptomycin IV as prescribed via her PICC line until 02/14/2021 4. Outpatient follow-up with infectious disease and orthopedic 5. PICC line can be removed after completion of antibiotic course  Home Health: PT/OT/RN Equipment/Devices: Equipment to be arranged by social worker/TOC team Discharge Condition: Stable CODE STATUS: Full Diet recommendation: Regular  Brief/Interim Summary: 60 year old female with history of chronic knee pain, asthma who presented to the emergency department at [facility] with complaint of worsening left hip pain, inability to ambulate due to pain. In the emergency department she was found to have fever, lab work showed leukocytosis. MRI revealed left septic sacroiliac joint. Orthopedics, IR , ID consulted. Underwent CT-guided aspiration of left on 12/20/20. Blood culture showed MRSA,on daptomycin.UnderwentTTE on on5/04/2019 without finding of any vegetations.ID following.Hospital course remarkable for persistent left hip pain.Follow-upCT hip showed6.0 x 3.1 x 1.6 cm area, concerning for developing phlegmonin theleft iliac muscle secondary to an adjacent left sacroiliac joint infection.Orthopedic/IR consulted  who recommended IR drain.  IR attempted to drain her hip on 5/11 but there was no collection to be drained therefore recommended 6 weeks of IV antibiotics. Patient is remains afebrile and is stable for discharge with outpatient IV antibiotics, pain control and bowel regimen.    Assessment & Plan:   Principal Problem:   MRSA (methicillin resistant Staphylococcus aureus)  carrier Active Problems:   SIRS (systemic inflammatory response syndrome) (HCC)   Left hip pain   Septic joint (HCC)   Chronic SI joint pain   Fever   Acute bacterial endocarditis   Left septic sacroiliac joint; MRSA Presented with fever, left hip pain. MRI of the lumbar spine showed left septic sacroiliac joint. IR, orthopedics, ID consulted on admission. Underwent CT-guided aspiration of left hip on 12/20/20. Final report of joint fluid culture showed MRSA. Patient on daptomycin Hospital course remarkable for persistent left hip pain.Follow-upCT hip showed6.0 x 3.1 x 1.6 cm area,concerning for developing phlegmonin theleft iliac muscle secondary to an adjacent left sacroiliac joint infection. Seen by orthopedic IR unable to place a drain, no obvious fluid collection.   Will complete 6-week antibiotic course.  Last day 6/27  MRSA bacteremia: persistent -Currently on Daptomycin, ID is following. Plan to continue until 02/14/21 TTE and TEE neg for Vegetation.  PICC removed.  Cultures from 5-9= NGTD  Hypertension: Norvasc 59m po daily.   Hypokalemia -replete prn  Chronic pain syndrome: -Continue supportive care, pain medications. Patient takes high-dose oxycodone at home on scheduled basis.  History of asthma:  -Currently saturating fine on room air. No wheezing. Bronchodilators prn  Debility/deconditioning/left hip pain:  PT/OT recommended HH on discharge.  Morbid obesity: BMI of 36.6      Body mass index is 35.73 kg/m.         Discharge Diagnoses:  Principal Problem:   MRSA (methicillin resistant Staphylococcus aureus) carrier Active Problems:   SIRS (systemic inflammatory response syndrome) (HCC)   Left hip pain   Septic joint (HCC)   Chronic SI joint pain   Fever   Acute bacterial endocarditis      Consultations:  Infectious disease  IR  Orthopedic  Subjective: Has some pain around the left hip  joint but no  other complaints.  Remains afebrile  Discharge Exam: Vitals:   12/30/20 0633 12/30/20 0746  BP: (!) 158/71   Pulse: 91   Resp: 15   Temp: 98.3 F (36.8 C)   SpO2: 100% 98%   Vitals:   12/29/20 1958 12/29/20 2122 12/30/20 0633 12/30/20 0746  BP:  (!) 141/80 (!) 158/71   Pulse:  95 91   Resp:  18 15   Temp:  99 F (37.2 C) 98.3 F (36.8 C)   TempSrc:   Oral   SpO2: 97% 97% 100% 98%  Weight:      Height:        General: Pt is alert, awake, not in acute distress Cardiovascular: RRR, S1/S2 +, no rubs, no gallops Respiratory: CTA bilaterally, no wheezing, no rhonchi Abdominal: Soft, NT, ND, bowel sounds + Extremities: no edema, no cyanosis  Discharge Instructions  Discharge Instructions    [facility] pharmacist to adjust dose for Vancomycin, Aminoglycosides and other anti-infective therapies as requested by physician.   Complete by: As directed    [facility] to provide Cath Flo 21m   Complete by: As directed    Administer for PICC line occlusion and as ordered by physician for other access device issues.   Anaphylaxis Kit: Provided to treat any anaphylactic reaction to the medication being provided to the patient if First Dose or when requested by physician   Complete by: As directed    Epinephrine 158mml vial / amp: Administer 0.19m54m0.19ml34mubcutaneously once for moderate to severe anaphylaxis, nurse to call physician and pharmacy when reaction occurs and call 911 if needed for immediate care   Diphenhydramine 50mg32mIV vial: Administer 25-50mg 41mM PRN for first dose reaction, rash, itching, mild reaction, nurse to call physician and pharmacy when reaction occurs   Sodium Chloride 0.9% NS 500ml I64mdminister if needed for hypovolemic blood pressure drop or as ordered by physician after call to physician with anaphylactic reaction   Call MD for:  redness, tenderness, or signs of infection (pain, swelling, redness, odor or green/yellow discharge  around incision site)   Complete by: As directed    Call MD for:  severe uncontrolled pain   Complete by: As directed    Call MD for:  temperature >100.4   Complete by: As directed    Change dressing on IV access line weekly and PRN   Complete by: As directed    Diet - low sodium heart healthy   Complete by: As directed    Discharge instructions   Complete by: As directed    You were cared for by a hospitalist during your hospital stay. If you have any questions about your discharge medications or the care you received while you were in the hospital after you are discharged, you can call the unit and asked to speak with the hospitalist on call if the hospitalist that took care of you is not available. Once you are discharged, your primary care physician will handle any further medical issues. Please note that NO REFILLS for any discharge medications will be authorized once you are discharged, as it is imperative that you return to your primary care physician (or establish a relationship with a primary care physician if you do not have one) for your aftercare needs so that they can reassess your need for medications and monitor your lab values.  Please request your Prim.MD to go over all Hospital Tests and Procedure/Radiological results at the follow  up, please get all Hospital records sent to your Prim MD by signing hospital release before you go home.  Get CBC, CMP, 2 view Chest X ray checked  by Primary MD during your next visit or SNF MD in 5-7 days ( we routinely change or add medications that can affect your baseline labs and fluid status, therefore we recommend that you get the mentioned basic workup next visit with your PCP, your PCP may decide not to get them or add new tests based on their clinical decision)  On your next visit with your primary care physician please Get Medicines reviewed and adjusted.  If you experience worsening of your admission symptoms, develop shortness of breath,  life threatening emergency, suicidal or homicidal thoughts you must seek medical attention immediately by calling 911 or calling your MD immediately  if symptoms less severe.  You Must read complete instructions/literature along with all the possible adverse reactions/side effects for all the Medicines you take and that have been prescribed to you. Take any new Medicines after you have completely understood and accpet all the possible adverse reactions/side effects.   Do not drive, operate heavy machinery, perform activities at heights, swimming or participation in water activities or provide baby sitting services if your were admitted for syncope or siezures until you have seen by Primary MD or a Neurologist and advised to do so again.  Do not drive when taking Pain medications.   Flush IV access with Sodium Chloride 0.9% and Heparin 10 units/ml or 100 units/ml   Complete by: As directed    Home infusion instructions - [facility]   Complete by: As directed    Instructions: Flush IV access with Sodium Chloride 0.9% and Heparin 10units/ml or 100units/ml   Change dressing on IV access line: Weekly and PRN   Instructions Cath Flo 60m: Administer for PICC Line occlusion and as ordered by physician for other access device   [facility] pharmacist to adjust dose for: Vancomycin, Aminoglycosides and other anti-infective therapies as requested by physician   Increase activity slowly   Complete by: As directed    Method of administration may be changed at the discretion of home infusion pharmacist based upon assessment of the patient and/or caregiver's ability to self-administer the medication ordered   Complete by: As directed    No wound care   Complete by: As directed      Allergies as of 12/30/2020   No Known Allergies     Medication List    STOP taking these medications   meloxicam 15 MG tablet Commonly known as: MOBIC     TAKE these medications   ALPRAZolam 0.5  MG tablet Commonly known as: XANAX Take 1 mg by mouth at bedtime as needed for sleep. For anxiety   amLODipine 5 MG tablet Commonly known as: NORVASC Take 1 tablet (5 mg total) by mouth daily.   CALCIUM 600+D3 PO Take 1 tablet by mouth 2 (two) times daily.   citalopram 40 MG tablet Commonly known as: CELEXA Take 40 mg by mouth every morning.   clobetasol cream 0.05 % Commonly known as: TEMOVATE Apply 1 application topically at bedtime.   daptomycin  IVPB Commonly known as: CUBICIN Inject 800 mg into the vein daily. Indication: MRSA bacteremia and infection of sacroiliac joint First Dose: Yes Last Day of Therapy:  02/14/21 Labs - Once weekly:  CBC/D, BMP, CPK, ESR and CRP Method of administration: IV Push Method of administration may be changed at  the discretion of home infusion pharmacist based upon assessment of the patient and/or caregiver's ability to self-administer the medication ordered.   docusate sodium 100 MG capsule Commonly known as: Colace Take 1 capsule (100 mg total) by mouth daily as needed for mild constipation or moderate constipation.   fish oil-omega-3 fatty acids 1000 MG capsule Take 1 g by mouth 2 (two) times daily.   Fluticasone-Salmeterol 250-50 MCG/DOSE Aepb Commonly known as: ADVAIR Inhale 2 puffs into the lungs 2 (two) times daily as needed (sob/wheezing).   gabapentin 100 MG capsule Commonly known as: NEURONTIN Take 300 mg by mouth at bedtime.   GLUCOSAMINE 1500 COMPLEX PO Take 1 capsule by mouth 2 (two) times daily.   methocarbamol 500 MG tablet Commonly known as: Robaxin Take 1 tablet (500 mg total) by mouth every 8 (eight) hours as needed for muscle spasms. What changed:   medication strength  how much to take  when to take this  reasons to take this   montelukast 10 MG tablet Commonly known as: SINGULAIR Take 10 mg by mouth every morning.   multivitamin with minerals Tabs tablet Take 1 tablet by mouth daily.   naproxen  500 MG tablet Commonly known as: Naprosyn Take 1 tablet (500 mg total) by mouth 2 (two) times daily with a meal for 10 days.   omeprazole 20 MG capsule Commonly known as: PRILOSEC Take 1 capsule (20 mg total) by mouth daily before breakfast. What changed: when to take this   oxyCODONE-acetaminophen 10-325 MG tablet Commonly known as: PERCOCET Take 1 tablet by mouth every 6 (six) hours as needed for pain.   polyethylene glycol 17 g packet Commonly known as: MIRALAX / GLYCOLAX Take 17 g by mouth daily as needed for severe constipation.   senna-docusate 8.6-50 MG tablet Commonly known as: Senokot-S Take 1 tablet by mouth 2 (two) times daily.   vitamin B-12 1000 MCG tablet Commonly known as: CYANOCOBALAMIN Take 1,000 mcg by mouth daily.            Durable Medical Equipment  (From admission, onward)         Start     Ordered   12/30/20 1201  For home use only DME Shower stool  Once       Comments: Shower chair;heavy duty baritric d/t body habitus.5'8" 106.6kg   12/30/20 1201   12/24/20 0852  For home use only DME 3 n 1  Once       Comments: Wide/body habitus bsc 5'7" 106.2kg   12/24/20 0851   12/23/20 1624  For home use only DME Walker rolling  Once       Comments: 5' 7"  106.2kg  Question Answer Comment  Walker: With [place]   Patient needs a walker to treat with the following condition Unsteady gait      12/23/20 1625           Discharge Care Instructions  (From admission, onward)         Start     Ordered   12/29/20 0000  Change dressing on IV access line weekly and PRN  (Home infusion instructions - [facility] )        12/29/20 1449          Follow-up Information    [name], MD. Schedule an appointment as soon as possible for a visit in 1 week(s).   Specialty: Internal Medicine Contact information: [address] [place] [address] [phone]  Thayer Headings, MD. Schedule an appointment as soon as possible  for a visit in 3 week(s).   Specialty: Infectious Diseases Contact information: [address] [place] [address] [phone]        Gaynelle Arabian, MD. Schedule an appointment as soon as possible for a visit in 4 week(s).   Specialty: Orthopedic Surgery Contact information: [address] [place] [address] [phone]        [facility] Follow up.   Why: [facility] nursing(flushes,teaching,labs)       [facility] Follow up.   Why: 3n1,rw,shower chair Contact information: [place] [address] [phone]              No Known Allergies  You were cared for by a hospitalist during your hospital stay. If you have any questions about your discharge medications or the care you received while you were in the hospital after you are discharged, you can call the unit and asked to speak with the hospitalist on call if the hospitalist that took care of you is not available. Once you are discharged, your primary care physician will handle any further medical issues. Please note that no refills for any discharge medications will be authorized once you are discharged, as it is imperative that you return to your primary care physician (or establish a relationship with a primary care physician if you do not have one) for your aftercare needs so that they can reassess your need for medications and monitor your lab values.   Procedures/Studies: CT HIP LEFT W CONTRAST  Result Date: 12/28/2020 CLINICAL DATA:  Septic arthritis suspected, hip, no prior imaging EXAM: CT OF THE LOWER LEFT EXTREMITY WITH CONTRAST TECHNIQUE: Multidetector CT imaging of the lower left extremity was performed according to the standard protocol following intravenous contrast administration. CONTRAST:  137m OMNIPAQUE IOHEXOL 300 MG/ML  SOLN COMPARISON:  MRI left hip 12/19/2020 FINDINGS: Bones/Joint/Cartilage There is no evidence of fracture. Normal alignment.  There is mild of moderate left hip degenerative change. There is enthesophyte formation along the greater trochanter and adjacent heterotopic ossification compatible with chronic gluteal tendinopathy. Similar findings along the ischial compatible with chronic hamstrings tendinopathy. There is mild degenerative change of the left SI joint with mild SI joint widening superiorly, partially visualized. There is no new osseous destruction. Ligaments Suboptimally assessed by CT. Muscles and Tendons There is a hypoattenuating region within the left iliac muscle measuring 6.0 x 3.1 x 1.6 cm with punctate foci of gas (soft tissue sagittal image 78, axial image 12). There is moderate gluteus minimus and medius muscle atrophy. Soft tissues No adenopathy. IMPRESSION: Hypoattenuating area within the left iliac muscle containing punctate foci of gas, measuring 6.0 x 3.1 x 1.6 cm, concerning for developing phlegmon secondary to an adjacent left sacroiliac joint infection. No new osseous erosion. No significant left hip joint effusion. 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 Electronically Signed   By: JMaurine Simmering  On: 12/28/2020 08:48   MR LUMBAR SPINE WO CONTRAST  Result Date: 12/19/2020 CLINICAL DATA:  60year old female with low back pain. Evidence of L4-L5 spinal stenosis on CT Abdomen and Pelvis yesterday. Possible infection. EXAM: MRI LUMBAR SPINE WITHOUT CONTRAST TECHNIQUE: Multiplanar, multisequence MR imaging of the lumbar spine was performed. No intravenous contrast was administered. COMPARISON:  CT Abdomen and Pelvis 12/18/2020. Lumbar MRI 12/11/2006. FINDINGS: Segmentation:  Normal. Alignment: Mildly increased lumbar lordosis since 2008. Mild grade 1 anterolisthesis  of L4 on L5, 2-3 mm is new since that time. Similar subtle anterolisthesis of L5 on S1. Vertebrae: No marrow edema or evidence of acute osseous abnormality. Visualized bone marrow signal is within normal  limits. Intact visible sacrum and SI joints. Conus medullaris and cauda equina: Conus extends to the T12-L1 level. No lower spinal cord or conus signal abnormality. Paraspinal and other soft tissues: Visible abdominal and pelvic viscera appear stable since yesterday. There is streaky inflammatory signal in the left lower lumbar erector spinae muscles on series 7 images 13 and 17, more pronounced at the L5 transverse process level. Negative lumbar paraspinal soft tissues otherwise. Disc levels: T11-T12: Negative. T12-L1:  Mild disc bulging.  No stenosis. L1-L2: Negative disc. Mild facet hypertrophy. Mild degenerative increased STIR signal in the interspinous ligament. No stenosis. L2-L3: Circumferential, mostly foraminal disc bulging. Mild to moderate facet hypertrophy greater on the left. Trace degenerative facet and interspinous ligament fluid signal. No spinal stenosis. Moderate bilateral L2 foraminal stenosis. L3-L4: Mild disc bulging and facet hypertrophy. Mild left L3 foraminal stenosis. L4-L5: Mild anterolisthesis with circumferential disc/pseudo disc. Broad-based posterior component. Moderate ligament flavum and moderate to severe facet hypertrophy. Moderate to severe spinal stenosis. At least moderate bilateral lateral recess stenosis (L5 nerve levels). Mild to moderate left L4 neural foraminal stenosis. L5-S1: Subtle anterolisthesis and disc bulging. Moderate facet hypertrophy. No spinal or lateral recess stenosis. Mild to moderate bilateral L5 foraminal stenosis. IMPRESSION: 1. Inflammatory signal in the left lower erector spinae muscle at L5 and S1, and adjacent to the left L5 transverse process is nonspecific but more resembles degenerative or posttraumatic muscle edema than infection. No paraspinal fluid collection. No evidence of discitis osteomyelitis. 2. L4-L5 moderate to severe spinal, at least moderate lateral recess, and up to moderate bilateral neural foraminal stenosis related to disc and  posterior element degeneration in the setting of grade 1 spondylolisthesis. 3. Mild L5-S1 spondylolisthesis with up to moderate bilateral L5 foraminal stenosis. 4. L2-L3 disc bulging with moderate bilateral L2 neural foraminal stenosis. Electronically Signed   By: Genevie Ann M.D.   On: 12/19/2020 10:58   CT ABDOMEN PELVIS W CONTRAST  Result Date: 12/18/2020 CLINICAL DATA:  LEFT lower quadrant abdominal pain and LEFT hip pain. Concern for diverticulitis. Concern for adult hip fracture. EXAM: CT ABDOMEN AND PELVIS WITH CONTRAST RECONSTRUCTED IMAGES OF THE PELVIS TECHNIQUE: Multidetector CT imaging of the abdomen and pelvis was performed using the standard protocol following bolus administration of intravenous contrast. Reconstructions performed of the osseous pelvis. CONTRAST:  155m OMNIPAQUE IOHEXOL 300 MG/ML  SOLN COMPARISON:  CT abdomen dated 03/31/2006. FINDINGS: Lower chest: No acute abnormality. Hepatobiliary: No focal liver abnormality is seen. Status post cholecystectomy with associated mild bile duct ectasia. Pancreas: Partially infiltrated with fat but otherwise unremarkable. Spleen: Normal in size without focal abnormality. Adrenals/Urinary Tract: Adrenal glands appear normal. Kidneys are unremarkable without mass, stone or hydronephrosis. No ureteral or bladder calculi are identified. Bladder is unremarkable. Stomach/Bowel: No dilated large or small bowel loops. No evidence of bowel wall inflammation. Stomach is unremarkable, partially decompressed. Vascular/Lymphatic: No significant vascular findings are present. No enlarged abdominal or pelvic lymph nodes. Reproductive: Uterus and bilateral adnexa are unremarkable. Other: No free fluid or abscess collection is seen. No free intraperitoneal air. Musculoskeletal: Degenerative spondylosis of the thoracolumbar spine, mild to moderate in degree. Associated disc bulge at L4-5 is causing severe central canal stenosis with suspected associated nerve root  impingement. Reconstruction images of the pelvis are provided to include the  upper femurs bilaterally. There is a benign osteochondroma exophytic to the proximal shaft of the RIGHT femur. No acute or suspicious osseous abnormality. No fracture is seen at the LEFT hip or within the proximal LEFT femur. Mild degenerative spurring at the LEFT hip. IMPRESSION: 1. No acute findings within the abdomen or pelvis. No bowel obstruction or evidence of bowel wall inflammation. No evidence of diverticulitis. No evidence of acute solid organ abnormality. No renal or ureteral calculi. 2. Degenerative spondylosis of the thoracolumbar spine, most significant in the lower lumbar spine. Associated large disc bulge at L4-5 is causing severe central canal stenosis. If symptoms could be radiculopathic symptoms, would consider nonemergent lumbar spine MRI to exclude associated nerve root impingement. 3. No fracture within the osseous pelvis or at the LEFT hip. Proximal LEFT femur is intact and normally aligned. Mild degenerative change at the LEFT hip. Electronically Signed   By: Franki Cabot M.D.   On: 12/18/2020 16:52   MR HIP LEFT WO CONTRAST  Result Date: 12/19/2020 CLINICAL DATA:  Soft tissue infection. EXAM: MR OF THE LEFT HIP WITHOUT CONTRAST TECHNIQUE: Multiplanar, multisequence MR imaging was performed. No intravenous contrast was administered. COMPARISON:  None. FINDINGS: Bones: No hip fracture, dislocation or avascular necrosis. No periosteal reaction or bone destruction. No aggressive osseous lesion. Small left SI joint effusion with adjacent soft tissue edema anteriorly and posteriorly in the adjacent multifidus muscle concerning for septic arthritis of the left SI joint. 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 Articular cartilage and labrum Articular cartilage: Partial-thickness cartilage loss of the left femoral head and acetabulum. Labrum: Limited evaluation secondary lack of intra-articular contrast. Degeneration  of the left superior labrum. Joint or bursal effusion Joint effusion:  No hip joint effusion. Bursae:  No bursa formation. Muscles and tendons Flexors: Normal. Extensors: Normal. Abductors: Normal. Adductors: Normal. Gluteals: Moderate tendinosis of the left gluteus medius tendon insertion. Mild tendinosis of the left gluteus minimus tendon insertion. Partial-thickness tear of the right gluteus medius tendon insertion. Hamstrings: Mild tendinosis of the left hamstring origin with a partial-thickness tear. Small partial-thickness tear of the right hamstring origin. Other findings Small amount of pelvic free fluid. No fluid collection or hematoma. No inguinal lymphadenopathy. No inguinal hernia. IMPRESSION: 1. Small left SI joint effusion with adjacent soft tissue edema anteriorly and posteriorly in the adjacent multifidus muscle concerning for septic arthritis of the left SI joint. 2. Moderate tendinosis of the left gluteus medius tendon insertion. Mild tendinosis of the left gluteus minimus tendon insertion. Partial-thickness tear of the right gluteus medius tendon insertion. 3. Mild tendinosis of the left hamstring origin with a partial-thickness tear. Small partial-thickness tear of the right hamstring origin. 4. Mild osteoarthritis of the left hip. Electronically Signed   By: Kathreen Devoid   On: 12/19/2020 10:49   CT ASPIRATION  Result Date: 12/20/2020 INDICATION: 60 year old female with a history of left-sided hip pain and an MRI suggesting left SI joint sacroiliitis or septic joint. Scant fluid on the MRI. EXAM: CT GUIDED NEEDLE PLACEMENT FOR ASPIRATION MEDICATIONS: The patient is currently admitted to the hospital and receiving intravenous antibiotics. The antibiotics were administered within an appropriate time frame prior to the initiation of the procedure. ANESTHESIA/SEDATION: Fentanyl 100 mcg IV; Versed 4.0 mg IV Moderate Sedation Time:  10 minutes The patient was continuously monitored during the  procedure by the interventional radiology nurse under my direct supervision. COMPLICATIONS: None PROCEDURE: Informed written consent was obtained from the patient after a thorough discussion of the procedural  risks, benefits and alternatives. All questions were addressed. Maximal Sterile Barrier Technique was utilized including caps, mask, sterile gowns, sterile gloves, sterile drape, hand hygiene and skin antiseptic. A timeout was performed prior to the initiation of the procedure. Patient was position prone position on the CT gantry table. Scout CT was acquired for planning purposes. 1% lidocaine was used for local anesthesia. After the patient is prepped and draped and anesthetized, CT guidance was used to place an 18 gauge trocar needle into the posterior aspect of the sacroiliac joint on the left with the needle tip confirmed with sequential CT images. The stylet was removed and then aspiration of very scant blood products was performed with less than 1 cc achieved. Sterile wash was then pushed through the needle which was somewhat uncomfortable for the patient given the pressure. The sterile wash was then aspirated with less than 1 cc achieved. Both of the syringes were sent for culture. Needle was removed and a sterile bandage was placed. Patient tolerated the procedure well and remained hemodynamically stable throughout. No complications were encountered and no significant blood loss IMPRESSION: Status post CT replacement for aspiration of the posterior left sacroiliac joint, yielding very scant material, less than 1 cc of fluid. Signed, Dulcy Fanny. Dellia Nims, RPVI [facility] Electronically Signed   By: Corrie Mckusick D.O.   On: 12/20/2020 11:00   US Venous Img Lower  Left (DVT Study)  Result Date: 12/18/2020 CLINICAL DATA:  60 year old with left hip and buttock pain. EXAM: LEFT LOWER EXTREMITY VENOUS DOPPLER ULTRASOUND TECHNIQUE: Gray-scale  sonography with graded compression, as well as color Doppler and duplex ultrasound were performed to evaluate the lower extremity deep venous systems from the level of the common femoral vein and including the common femoral, femoral, profunda femoral, popliteal and calf veins including the posterior tibial, peroneal and gastrocnemius veins when visible. The superficial great saphenous vein was also interrogated. Spectral Doppler was utilized to evaluate flow at rest and with distal augmentation maneuvers in the common femoral, femoral and popliteal veins. COMPARISON:  None. FINDINGS: Contralateral Common Femoral Vein: Respiratory phasicity is normal and symmetric with the symptomatic side. No evidence of thrombus. Normal compressibility. Common Femoral Vein: No evidence of thrombus. Normal compressibility, respiratory phasicity and response to augmentation. Saphenofemoral Junction: No evidence of thrombus. Normal compressibility and flow on color Doppler imaging. Profunda Femoral Vein: No evidence of thrombus. Normal compressibility and flow on color Doppler imaging. Femoral Vein: No evidence of thrombus. Normal compressibility, respiratory phasicity and response to augmentation. Popliteal Vein: No evidence of thrombus. Normal compressibility, respiratory phasicity and response to augmentation. Calf Veins: No evidence of thrombus. Normal compressibility and flow on color Doppler imaging. Superficial Great Saphenous Vein: No evidence of thrombus. Normal compressibility. Other Findings:  None. IMPRESSION: Negative for deep venous thrombosis in left lower extremity. Electronically Signed   By: Markus Daft M.D.   On: 12/18/2020 11:49   DG Chest Portable 1 View  Result Date: 12/18/2020 CLINICAL DATA:  Fever EXAM: PORTABLE CHEST 1 VIEW COMPARISON:  None. FINDINGS: Borderline cardiomegaly. Lungs are clear. No pleural effusion or pneumothorax is seen. Osseous structures about the chest are unremarkable. IMPRESSION: No  active disease. No evidence of pneumonia or pulmonary edema. Borderline cardiomegaly. Electronically Signed   By: Franki Cabot M.D.   On: 12/18/2020 20:07   ECHOCARDIOGRAM COMPLETE  Result Date: 12/22/2020    ECHOCARDIOGRAM REPORT   Patient Name:   OMAYA NIELAND [facility] Date of Exam: 12/22/2020  Medical Rec #:  [id number]        Height:       67.0 in Accession #:    [id number]       Weight:       234.1 lb Date of Birth:  1961/06/07         BSA:          2.162 m Patient Age:    52 years         BP:           160/81 mmHg Patient Gender: F                HR:           70 bpm. Exam Location:  Inpatient Procedure: 2D Echo, Cardiac Doppler and Color Doppler Indications:    Bacteremia  History:        Patient has no prior history of Echocardiogram examinations.  Sonographer:    Mikki Santee RDCS Referring Phys: [id number] AMRIT ADHIKARI IMPRESSIONS  1. Left ventricular ejection fraction, by estimation, is 60 to 65%. The left ventricle has normal function. The left ventricle has no regional wall motion abnormalities. There is mild left ventricular hypertrophy. Left ventricular diastolic parameters were normal.  2. Right ventricular systolic function is normal. The right ventricular size is normal.  3. Left atrial size was moderately dilated.  4. The pericardial effusion is anterior to the right ventricle.  5. The mitral valve is normal in structure. No evidence of mitral valve regurgitation. No evidence of mitral stenosis.  6. Probably tri leaflet . The aortic valve was not well visualized. There is moderate calcification of the aortic valve. Aortic valve regurgitation is not visualized. Moderate aortic valve stenosis.  7. The inferior vena cava is normal in size with greater than 50% respiratory variability, suggesting right atrial pressure of 3 mmHg. FINDINGS  Left Ventricle: Left ventricular ejection fraction, by estimation, is 60 to 65%. The left ventricle has normal function. The left ventricle has no regional wall motion  abnormalities. The left ventricular internal cavity size was normal in size. There is  mild left ventricular hypertrophy. Left ventricular diastolic parameters were normal. Right Ventricle: The right ventricular size is normal. No increase in right ventricular wall thickness. Right ventricular systolic function is normal. Left Atrium: Left atrial size was moderately dilated. Right Atrium: Right atrial size was normal in size. Pericardium: Trivial pericardial effusion is present. The pericardial effusion is anterior to the right ventricle. Mitral Valve: The mitral valve is normal in structure. No evidence of mitral valve regurgitation. No evidence of mitral valve stenosis. Tricuspid Valve: The tricuspid valve is normal in structure. Tricuspid valve regurgitation is not demonstrated. No evidence of tricuspid stenosis. Aortic Valve: Probably tri leaflet. The aortic valve was not well visualized. There is moderate calcification of the aortic valve. Aortic valve regurgitation is not visualized. Moderate aortic stenosis is present. Aortic valve mean gradient measures 28.5  mmHg. Aortic valve peak gradient measures 51.6 mmHg. Aortic valve area, by VTI measures 1.05 cm. Pulmonic Valve: The pulmonic valve was normal in structure. Pulmonic valve regurgitation is not visualized. No evidence of pulmonic stenosis. Aorta: The aortic root is normal in size and structure. Venous: The inferior vena cava is normal in size with greater than 50% respiratory variability, suggesting right atrial pressure of 3 mmHg. IAS/Shunts: No atrial level shunt detected by color flow Doppler.  LEFT VENTRICLE PLAX 2D LVIDd:         4.95 cm  Diastology LVIDs:         3.96 cm  LV e' medial:    5.34 cm/s LV PW:         1.20 cm  LV E/e' medial:  20.4 LV IVS:        1.25 cm  LV e' lateral:   8.50 cm/s LVOT diam:     2.00 cm  LV E/e' lateral: 12.8 LV SV:         71 LV SV Index:   33 LVOT Area:     3.14 cm  RIGHT VENTRICLE RV S prime:     11.60 cm/s TAPSE  (M-mode): 1.7 cm LEFT ATRIUM              Index       RIGHT ATRIUM           Index LA diam:        4.40 cm  2.03 cm/m  RA Area:     13.40 cm LA Vol (A2C):   116.0 ml 53.65 ml/m RA Volume:   29.80 ml  13.78 ml/m LA Vol (A4C):   67.0 ml  30.99 ml/m LA Biplane Vol: 94.7 ml  43.80 ml/m  AORTIC VALVE AV Area (Vmax):    1.02 cm AV Area (Vmean):   1.04 cm AV Area (VTI):     1.05 cm AV Vmax:           359.00 cm/s AV Vmean:          248.500 cm/s AV VTI:            0.676 m AV Peak Grad:      51.6 mmHg AV Mean Grad:      28.5 mmHg LVOT Vmax:         117.00 cm/s LVOT Vmean:        82.500 cm/s LVOT VTI:          0.225 m LVOT/AV VTI ratio: 0.33  AORTA Ao Root diam: 2.90 cm MITRAL VALVE MV Area (PHT): 2.66 cm     SHUNTS MV Decel Time: 285 msec     Systemic VTI:  0.22 m MV E velocity: 109.00 cm/s  Systemic Diam: 2.00 cm MV A velocity: 88.30 cm/s MV E/A ratio:  1.23 Jenkins Rouge MD Electronically signed by Jenkins Rouge MD Signature Date/Time: 12/22/2020/4:39:08 PM    Final    ECHO TEE  Result Date: 12/27/2020    TRANSESOPHOGEAL ECHO REPORT   Patient Name:   ZHAVIA CUNANAN [facility] Date of Exam: 12/27/2020 Medical Rec #:  [id number]        Height:       68.0 in Accession #:    [id number]       Weight:       246.7 lb Date of Birth:  07-09-1961         BSA:          2.234 m Patient Age:    60 years         BP:           152/66 mmHg Patient Gender: F                HR:           100 bpm. Exam Location:  Inpatient Procedure: Transesophageal Echo, Cardiac Doppler and Color Doppler Indications:     Bacteremia  History:         Patient has prior history of Echocardiogram examinations, most  recent 12/22/2020. Signs/Symptoms:Fever and Bacteremia; Risk                  Factors:Obesity. MRSA.  Sonographer:     Dustin Flock Referring Phys:  [id number] Leanor Kail Diagnosing Phys: Gwyndolyn Kaufman MD PROCEDURE: The transesophogeal probe was passed without difficulty through the esophogus of the patient. Sedation performed  by performing physician. Patients was under conscious sedation during this procedure. Anesthetic administered: 180mg of Fentanyl,  9.071mof Versed. The patient developed no complications during the procedure. IMPRESSIONS  1. The aortic valve is tricuspid with moderate leaflet thickening and calcification. There is also nodular commisural calcification which is highly echogenic. This is most consistent with calcification with low suspicion for valvular vegetations. There is moderate aortic stenosis with AVA 1.1cm2, mean gradient 3473m, peak gradient 87m18m Vmax 3.48m/s66mI 0.3. Trivial aortic regurgitation.  2. Left ventricular ejection fraction, by estimation, is 60 to 65%. The left ventricle has normal function.  3. Right ventricular systolic function is normal. The right ventricular size is normal.  4. No left atrial/left atrial appendage thrombus was detected.  5. The mitral valve is normal in structure. Trivial mitral valve regurgitation. No evidence of mitral stenosis.  6. There is mild (Grade II) plaque.  7. No distinct valvular vegetations visualized on TEE. FINDINGS  Left Ventricle: Left ventricular ejection fraction, by estimation, is 60 to 65%. The left ventricle has normal function. The left ventricular internal cavity size was normal in size. Right Ventricle: The right ventricular size is normal. No increase in right ventricular wall thickness. Right ventricular systolic function is normal. Left Atrium: Left atrial size was normal in size. No left atrial/left atrial appendage thrombus was detected. Right Atrium: Right atrial size was normal in size. Pericardium: There is no evidence of pericardial effusion. Mitral Valve: The mitral valve is normal in structure. There is mild thickening of the mitral valve leaflet(s). There is mild calcification of the mitral valve leaflet(s). Mild to moderate mitral annular calcification. Trivial mitral valve regurgitation.  No evidence of mitral valve stenosis.  Tricuspid Valve: The tricuspid valve is normal in structure. Tricuspid valve regurgitation is trivial. Aortic Valve: The aortic valve is tricuspid with moderate leaflet thickening and calcification. There is also nodular, commisural calcification noted which is very echogenic. This is most consistent with calcification with low suspicion for valvular vegetations. There is moderate aortic stenosis with AVA 1.1cm2, mean gradient 34mmH63meak gradient 87mmHg74max 3.48m/s, D32m.3. Trivial aortic regurgitation. Pulmonic Valve: The pulmonic valve was normal in structure. Pulmonic valve regurgitation is trivial. Aorta: The aortic root and ascending aorta are structurally normal, with no evidence of dilitation. There is mild (Grade II) plaque. IAS/Shunts: No atrial level shunt detected by color flow Doppler.  AORTIC VALVE AV Vmax:           387.00 cm/s AV Vmean:          274.000 cm/s AV VTI:            0.751 m AV Peak Grad:      59.9 mmHg AV Mean Grad:      34.0 mmHg LVOT Vmax:         116.00 cm/s LVOT Vmean:        79.300 cm/s LVOT VTI:          0.255 m LVOT/AV VTI ratio: 0.34  SHUNTS Systemic VTI: 0.26 m Heather Gwyndolyn Kaufmantronically signed by Heather Gwyndolyn Kaufmanature Date/Time: 12/27/2020/4:04:25 PM    Final  CT NO CHARGE  Result Date: 12/18/2020 CLINICAL DATA:  LEFT lower quadrant abdominal pain and LEFT hip pain. Concern for diverticulitis. Concern for adult hip fracture. EXAM: CT ABDOMEN AND PELVIS WITH CONTRAST RECONSTRUCTED IMAGES OF THE PELVIS TECHNIQUE: Multidetector CT imaging of the abdomen and pelvis was performed using the standard protocol following bolus administration of intravenous contrast. Reconstructions performed of the osseous pelvis. CONTRAST:  174m OMNIPAQUE IOHEXOL 300 MG/ML  SOLN COMPARISON:  CT abdomen dated 03/31/2006. FINDINGS: Lower chest: No acute abnormality. Hepatobiliary: No focal liver abnormality is seen. Status post cholecystectomy with associated mild bile duct  ectasia. Pancreas: Partially infiltrated with fat but otherwise unremarkable. Spleen: Normal in size without focal abnormality. Adrenals/Urinary Tract: Adrenal glands appear normal. Kidneys are unremarkable without mass, stone or hydronephrosis. No ureteral or bladder calculi are identified. Bladder is unremarkable. Stomach/Bowel: No dilated large or small bowel loops. No evidence of bowel wall inflammation. Stomach is unremarkable, partially decompressed. Vascular/Lymphatic: No significant vascular findings are present. No enlarged abdominal or pelvic lymph nodes. Reproductive: Uterus and bilateral adnexa are unremarkable. Other: No free fluid or abscess collection is seen. No free intraperitoneal air. Musculoskeletal: Degenerative spondylosis of the thoracolumbar spine, mild to moderate in degree. Associated disc bulge at L4-5 is causing severe central canal stenosis with suspected associated nerve root impingement. Reconstruction images of the pelvis are provided to include the upper femurs bilaterally. There is a benign osteochondroma exophytic to the proximal shaft of the RIGHT femur. No acute or suspicious osseous abnormality. No fracture is seen at the LEFT hip or within the proximal LEFT femur. Mild degenerative spurring at the LEFT hip. IMPRESSION: 1. No acute findings within the abdomen or pelvis. No bowel obstruction or evidence of bowel wall inflammation. No evidence of diverticulitis. No evidence of acute solid organ abnormality. No renal or ureteral calculi. 2. Degenerative spondylosis of the thoracolumbar spine, most significant in the lower lumbar spine. Associated large disc bulge at L4-5 is causing severe central canal stenosis. If symptoms could be radiculopathic symptoms, would consider nonemergent lumbar spine MRI to exclude associated nerve root impingement. 3. No fracture within the osseous pelvis or at the LEFT hip. Proximal LEFT femur is intact and normally aligned. Mild degenerative change  at the LEFT hip. Electronically Signed   By: SFranki CabotM.D.   On: 12/18/2020 16:52   DG Hip Unilat W or Wo Pelvis 2-3 Views Left  Result Date: 12/18/2020 CLINICAL DATA:  LEFT hip pain, no known injury. EXAM: DG HIP (WITH OR WITHOUT PELVIS) 2-3V LEFT COMPARISON:  Plain film of the pelvis dated 09/26/2006 FINDINGS: Single-view of the pelvis and two views of the LEFT hip are provided. Osseous alignment appears normal. No fracture line or displaced fracture fragment is seen. Mild degenerative spurring at the bilateral hip joints. IMPRESSION: 1. No acute findings. 2. Mild degenerative change. Electronically Signed   By: SFranki CabotM.D.   On: 12/18/2020 11:52   CT IMAGE GUIDED DRAINAGE BY PERCUTANEOUS CATHETER  Result Date: 12/29/2020 INDICATION: 60year old female with a history of left septic sacroiliac joint. She has a small fluid collection/phlegmon in the overlying iliopsoas muscle and presents for attempted aspiration versus possible drain placement. EXAM: CT-guided aspiration MEDICATIONS: The patient is currently admitted to the hospital and receiving intravenous antibiotics. The antibiotics were administered within an appropriate time frame prior to the initiation of the procedure. ANESTHESIA/SEDATION: Fentanyl 100 mcg IV; Versed 4 mg IV Moderate Sedation Time:  18 minutes The patient was continuously monitored during the procedure  by the interventional radiology nurse under my direct supervision. COMPLICATIONS: None immediate. PROCEDURE: Informed written consent was obtained from the patient after a thorough discussion of the procedural risks, benefits and alternatives. All questions were addressed. Maximal Sterile Barrier Technique was utilized including caps, mask, sterile gowns, sterile gloves, sterile drape, hand hygiene and skin antiseptic. A timeout was performed prior to the initiation of the procedure. A planning axial CT scan was performed. The small phlegmon/fluid collection in the left  iliopsoas muscle was identified. A suitable skin entry site was selected and marked. Local anesthesia was attained by infiltration with 1% lidocaine. A small dermatotomy was made. Under intermittent CT guidance, a 15 cm 18 gauge needle was carefully advanced into the collection. Aspiration was performed yielding no purulent output. The needle was removed. IMPRESSION: Negative attempted aspiration of left iliopsoas fluid collection/phlegmon. This likely represents a phlegmon. Recommend continued antibiotics. Electronically Signed   By: Jacqulynn Cadet M.D.   On: 12/29/2020 13:20   [place] EKG SITE RITE  Result Date: 12/24/2020 If Site Rite image not attached, placement could not be confirmed due to current cardiac rhythm.     The results of significant diagnostics from this hospitalization (including imaging, microbiology, ancillary and laboratory) are listed below for reference.     Microbiology: Recent Results (from the past 240 hour(s))  Culture, blood (routine x 2)     Status: None   Collection Time: 12/21/20 12:09 PM   Specimen: BLOOD  Result Value Ref Range Status   Specimen Description   Final    BLOOD LEFT ANTECUBITAL Performed at [facility] [address] [place], [place] [address]    Special Requests   Final    BOTTLES DRAWN AEROBIC AND ANAEROBIC Blood Culture results may not be optimal due to an excessive volume of blood received in culture bottles   Culture   Final    NO GROWTH 5 DAYS Performed at [facility], [place] [address] [place], [place] [address]    Report Status 12/26/2020 FINAL  Final  Culture, blood (routine x 2)     Status: None   Collection Time: 12/21/20 12:20 PM   Specimen: BLOOD RIGHT HAND  Result Value Ref Range Status   Specimen Description   Final    BLOOD RIGHT HAND Performed at [facility] [address], [place], [place] [address]    Special Requests   Final    BOTTLES DRAWN AEROBIC AND ANAEROBIC Blood  Culture adequate volume Performed at [facility] [address] [place], [place] [address]    Culture   Final    NO GROWTH 5 DAYS Performed at [facility], [place] [address], [place], [place] [address]    Report Status 12/26/2020 FINAL  Final  Culture, blood (routine x 2)     Status: Abnormal   Collection Time: 12/24/20 11:53 AM   Specimen: BLOOD LEFT HAND  Result Value Ref Range Status   Specimen Description   Final    BLOOD LEFT HAND Performed at [facility] [address], [place], [place] [address]    Special Requests   Final    BOTTLES DRAWN AEROBIC ONLY Blood Culture adequate volume Performed at [facility] [address], [place], [place] [address]    Culture  Setup Time   Final    AEROBIC BOTTLE ONLY GRAM POSITIVE COCCI CRITICAL RESULT CALLED TO, READ BACK BY AND VERIFIED WITH: C. SHADE PHARMD,  AT [id number] 12/25/20 [place] Performed at [facility], [place] [address] [place], [place] [address]    Culture STAPHYLOCOCCUS AUREUS (A)  Final   Report Status 12/27/2020 FINAL  Final   Organism ID, Bacteria STAPHYLOCOCCUS AUREUS  Final      Susceptibility   Staphylococcus aureus - MIC*    CIPROFLOXACIN >=8 RESISTANT Resistant     ERYTHROMYCIN >=8 RESISTANT Resistant     GENTAMICIN <=0.5 SENSITIVE Sensitive     OXACILLIN >=4 RESISTANT Resistant     TETRACYCLINE <=1 SENSITIVE Sensitive     VANCOMYCIN <=0.5 SENSITIVE Sensitive     TRIMETH/SULFA <=10 SENSITIVE Sensitive     CLINDAMYCIN <=0.25 SENSITIVE Sensitive     RIFAMPIN <=0.5 SENSITIVE Sensitive     Inducible Clindamycin NEGATIVE Sensitive     * STAPHYLOCOCCUS AUREUS  Blood Culture ID Panel (Reflexed)     Status: Abnormal   Collection Time: 12/24/20 11:53 AM  Result Value Ref Range Status   Enterococcus faecalis NOT DETECTED NOT DETECTED Final   Enterococcus Faecium NOT DETECTED NOT DETECTED Final   Listeria monocytogenes NOT DETECTED NOT DETECTED  Final   Staphylococcus species DETECTED (A) NOT DETECTED Final    Comment: CRITICAL RESULT CALLED TO, READ BACK BY AND VERIFIED WITH: C. SHADE PHARMD, AT 6073 12/25/20 D. VANHOOK    Staphylococcus aureus (BCID) DETECTED (A) NOT DETECTED Final    Comment: Methicillin (oxacillin)-resistant Staphylococcus aureus (MRSA). MRSA is predictably resistant to beta-lactam antibiotics (except ceftaroline). Preferred therapy is vancomycin unless clinically contraindicated. Patient requires contact precautions if  hospitalized. CRITICAL RESULT CALLED TO, READ BACK BY AND VERIFIED WITH: C. SHADE PHARMD, AT [id number] 12/25/20 D. VANHOOK    Staphylococcus epidermidis NOT DETECTED NOT DETECTED Final   Staphylococcus lugdunensis NOT DETECTED NOT DETECTED Final   Streptococcus species NOT DETECTED NOT DETECTED Final   Streptococcus agalactiae NOT DETECTED NOT DETECTED Final   Streptococcus pneumoniae NOT DETECTED NOT DETECTED Final   Streptococcus pyogenes NOT DETECTED NOT DETECTED Final   A.calcoaceticus-baumannii NOT DETECTED NOT DETECTED Final   Bacteroides fragilis NOT DETECTED NOT DETECTED Final   Enterobacterales NOT DETECTED NOT DETECTED Final   Enterobacter cloacae complex NOT DETECTED NOT DETECTED Final   Escherichia coli NOT DETECTED NOT DETECTED Final   Klebsiella aerogenes NOT DETECTED NOT DETECTED Final   Klebsiella oxytoca NOT DETECTED NOT DETECTED Final   Klebsiella pneumoniae NOT DETECTED NOT DETECTED Final   Proteus species NOT DETECTED NOT DETECTED Final   Salmonella species NOT DETECTED NOT DETECTED Final   Serratia marcescens NOT DETECTED NOT DETECTED Final   Haemophilus influenzae NOT DETECTED NOT DETECTED Final   Neisseria meningitidis NOT DETECTED NOT DETECTED Final   Pseudomonas aeruginosa NOT DETECTED NOT DETECTED Final   Stenotrophomonas maltophilia NOT DETECTED NOT DETECTED Final   Candida albicans NOT DETECTED NOT DETECTED Final   Candida auris NOT DETECTED NOT DETECTED Final    Candida glabrata NOT DETECTED NOT DETECTED Final   Candida krusei NOT DETECTED NOT DETECTED Final   Candida parapsilosis NOT DETECTED NOT DETECTED Final   Candida tropicalis NOT DETECTED NOT DETECTED Final   Cryptococcus neoformans/gattii NOT DETECTED NOT DETECTED Final   Meth resistant mecA/C and MREJ DETECTED (A) NOT DETECTED Final    Comment: CRITICAL RESULT CALLED TO, READ BACK BY AND VERIFIED WITH: C. Roseanne Kaufman, AT 2694 12/25/20 [facility] Performed at [facility], [place] [address], [place], [place] [address]   Culture, blood (routine x 2)     Status: None (  Preliminary result)   Collection Time: 12/27/20  5:52 PM   Specimen: BLOOD LEFT HAND  Result Value Ref Range Status   Specimen Description   Final    BLOOD LEFT HAND Performed at [facility] [address], [place], [place] [address]    Special Requests   Final    BOTTLES DRAWN AEROBIC ONLY Blood Culture results may not be optimal due to an inadequate volume of blood received in culture bottles Performed at [facility] [address] [place], [place] [address]    Culture   Final    NO GROWTH 3 DAYS Performed at [facility], [place] [address], [place], [place] [address]    Report Status PENDING  Incomplete     Labs: BNP (last 3 results) No results for input(s): BNP in the last 8760 hours. Basic Metabolic Panel: Recent Labs  Lab 12/24/20 0401 12/25/20 0346 12/26/20 0305 12/29/20 0327  NA 137 144 141 140  K 3.2* 3.1* 3.8 4.1  CL 98 103 101 103  CO2 27 30 32 28  GLUCOSE 121* 103* 105* 112*  BUN 11 13 10 10   CREATININE 0.56 0.63 0.57 0.34*  CALCIUM 9.2 9.4 8.9 9.1   Liver Function Tests: No results for input(s): AST, ALT, ALKPHOS, BILITOT, PROT, ALBUMIN in the last 168 hours. No results for input(s): LIPASE, AMYLASE in the last 168 hours. No results for input(s): AMMONIA in the last 168 hours. CBC: Recent Labs  Lab 12/24/20 0401 12/25/20 0346  12/26/20 0305 12/29/20 0327  WBC 18.2* 16.3* 14.6* 8.9  NEUTROABS 13.4* 11.9* 10.8* 6.2  HGB 11.3* 10.6* 10.0* 9.5*  HCT 34.9* 33.6* 31.6* 30.2*  MCV 93.6 95.2 94.3 95.6  PLT 309 363 373 366   Cardiac Enzymes: Recent Labs  Lab 12/25/20 0346  CKTOTAL 17*   BNP: Invalid input(s): POCBNP CBG: No results for input(s): GLUCAP in the last 168 hours. D-Dimer No results for input(s): DDIMER in the last 72 hours. Hgb A1c No results for input(s): HGBA1C in the last 72 hours. Lipid Profile No results for input(s): CHOL, HDL, LDLCALC, TRIG, CHOLHDL, LDLDIRECT in the last 72 hours. Thyroid function studies No results for input(s): TSH, T4TOTAL, T3FREE, THYROIDAB in the last 72 hours.  Invalid input(s): FREET3 Anemia work up No results for input(s): VITAMINB12, FOLATE, FERRITIN, TIBC, IRON, RETICCTPCT in the last 72 hours. Urinalysis    Component Value Date/Time   COLORURINE YELLOW 12/18/2020 2012   APPEARANCEUR CLEAR 12/18/2020 2012   LABSPEC >1.046 (H) 12/18/2020 2012   PHURINE 6.5 12/18/2020 2012   GLUCOSEU NEGATIVE 12/18/2020 2012   HGBUR NEGATIVE 12/18/2020 2012   BILIRUBINUR NEGATIVE 12/18/2020 2012   KETONESUR 15 (A) 12/18/2020 2012   PROTEINUR 30 (A) 12/18/2020 2012   UROBILINOGEN 0.2 11/20/2013 1603   NITRITE NEGATIVE 12/18/2020 2012   LEUKOCYTESUR NEGATIVE 12/18/2020 2012   Sepsis Labs Invalid input(s): PROCALCITONIN,  WBC,  LACTICIDVEN Microbiology Recent Results (from the past 240 hour(s))  Culture, blood (routine x 2)     Status: None   Collection Time: 12/21/20 12:09 PM   Specimen: BLOOD  Result Value Ref Range Status   Specimen Description   Final    BLOOD LEFT ANTECUBITAL Performed at [facility], [place] [address], [place], [place] [address]    Special Requests   Final    BOTTLES DRAWN AEROBIC AND ANAEROBIC Blood Culture results may not be optimal due to an excessive volume of blood received in culture bottles  Culture   Final    NO  GROWTH 5 DAYS Performed at [facility], [place] [address] [place], [place] [address]    Report Status 12/26/2020 FINAL  Final  Culture, blood (routine x 2)     Status: None   Collection Time: 12/21/20 12:20 PM   Specimen: BLOOD RIGHT HAND  Result Value Ref Range Status   Specimen Description   Final    BLOOD RIGHT HAND Performed at [facility] [address], [place], [place] [address]    Special Requests   Final    BOTTLES DRAWN AEROBIC AND ANAEROBIC Blood Culture adequate volume Performed at [facility] [address] [place], [place] [address]    Culture   Final    NO GROWTH 5 DAYS Performed at [facility], [place] [address] [place], [place] [address]    Report Status 12/26/2020 FINAL  Final  Culture, blood (routine x 2)     Status: Abnormal   Collection Time: 12/24/20 11:53 AM   Specimen: BLOOD LEFT HAND  Result Value Ref Range Status   Specimen Description   Final    BLOOD LEFT HAND Performed at [facility] [address], [place], [place] [address]    Special Requests   Final    BOTTLES DRAWN AEROBIC ONLY Blood Culture adequate volume Performed at [facility] [address] [place], [place] [address]    Culture  Setup Time   Final    AEROBIC BOTTLE ONLY GRAM POSITIVE COCCI CRITICAL RESULT CALLED TO, READ BACK BY AND VERIFIED WITH: C. Roseanne Kaufman, AT 2841 12/25/20 Rush [facility]mark Performed at [facility], [place] [address], [place], [place] [address]    Culture STAPHYLOCOCCUS AUREUS (A)  Final   Report Status 12/27/2020 FINAL  Final   Organism ID, Bacteria STAPHYLOCOCCUS AUREUS  Final      Susceptibility   Staphylococcus aureus - MIC*    CIPROFLOXACIN >=8 RESISTANT Resistant     ERYTHROMYCIN >=8 RESISTANT Resistant     GENTAMICIN <=0.5 SENSITIVE Sensitive     OXACILLIN >=4 RESISTANT Resistant     TETRACYCLINE <=1 SENSITIVE Sensitive     VANCOMYCIN <=0.5 SENSITIVE  Sensitive     TRIMETH/SULFA <=10 SENSITIVE Sensitive     CLINDAMYCIN <=0.25 SENSITIVE Sensitive     RIFAMPIN <=0.5 SENSITIVE Sensitive     Inducible Clindamycin NEGATIVE Sensitive     * STAPHYLOCOCCUS AUREUS  Blood Culture ID Panel (Reflexed)     Status: Abnormal   Collection Time: 12/24/20 11:53 AM  Result Value Ref Range Status   Enterococcus faecalis NOT DETECTED NOT DETECTED Final   Enterococcus Faecium NOT DETECTED NOT DETECTED Final   Listeria monocytogenes NOT DETECTED NOT DETECTED Final   Staphylococcus species DETECTED (A) NOT DETECTED Final    Comment: CRITICAL RESULT CALLED TO, READ BACK BY AND VERIFIED WITH: C. SHADE PHARMD, AT 1027 12/25/20 D. VANHOOK    Staphylococcus aureus (BCID) DETECTED (A) NOT DETECTED Final    Comment: Methicillin (oxacillin)-resistant Staphylococcus aureus (MRSA). MRSA is predictably resistant to beta-lactam antibiotics (except ceftaroline). Preferred therapy is vancomycin unless clinically contraindicated. Patient requires contact precautions if  hospitalized. CRITICAL RESULT CALLED TO, READ BACK BY AND VERIFIED WITH: C. SHADE PHARMD, AT 2536 12/25/20 D. VANHOOK    Staphylococcus epidermidis NOT DETECTED NOT DETECTED Final   Staphylococcus lugdunensis NOT DETECTED NOT DETECTED Final   Streptococcus species NOT DETECTED NOT DETECTED Final   Streptococcus agalactiae  NOT DETECTED NOT DETECTED Final   Streptococcus pneumoniae NOT DETECTED NOT DETECTED Final   Streptococcus pyogenes NOT DETECTED NOT DETECTED Final   A.calcoaceticus-baumannii NOT DETECTED NOT DETECTED Final   Bacteroides fragilis NOT DETECTED NOT DETECTED Final   Enterobacterales NOT DETECTED NOT DETECTED Final   Enterobacter cloacae complex NOT DETECTED NOT DETECTED Final   Escherichia coli NOT DETECTED NOT DETECTED Final   Klebsiella aerogenes NOT DETECTED NOT DETECTED Final   Klebsiella oxytoca NOT DETECTED NOT DETECTED Final   Klebsiella pneumoniae NOT DETECTED NOT DETECTED Final    Proteus species NOT DETECTED NOT DETECTED Final   Salmonella species NOT DETECTED NOT DETECTED Final   Serratia marcescens NOT DETECTED NOT DETECTED Final   Haemophilus influenzae NOT DETECTED NOT DETECTED Final   Neisseria meningitidis NOT DETECTED NOT DETECTED Final   Pseudomonas aeruginosa NOT DETECTED NOT DETECTED Final   Stenotrophomonas maltophilia NOT DETECTED NOT DETECTED Final   Candida albicans NOT DETECTED NOT DETECTED Final   Candida auris NOT DETECTED NOT DETECTED Final   Candida glabrata NOT DETECTED NOT DETECTED Final   Candida krusei NOT DETECTED NOT DETECTED Final   Candida parapsilosis NOT DETECTED NOT DETECTED Final   Candida tropicalis NOT DETECTED NOT DETECTED Final   Cryptococcus neoformans/gattii NOT DETECTED NOT DETECTED Final   Meth resistant mecA/C and MREJ DETECTED (A) NOT DETECTED Final    Comment: CRITICAL RESULT CALLED TO, READ BACK BY AND VERIFIED WITH: C. Roseanne Kaufman, AT 7001 12/25/20 Rush Landmark Performed at [facility], [place] [address], [place], [place] [address]   Culture, blood (routine x 2)     Status: None (Preliminary result)   Collection Time: 12/27/20  5:52 PM   Specimen: BLOOD LEFT HAND  Result Value Ref Range Status   Specimen Description   Final    BLOOD LEFT HAND Performed at [facility] [address] [place], [place] [address]    Special Requests   Final    BOTTLES DRAWN AEROBIC ONLY Blood Culture results may not be optimal due to an inadequate volume of blood received in culture bottles Performed at [facility] [address], [place], [place] [address]    Culture   Final    NO GROWTH 3 DAYS Performed at [facility], [place] [address], [place], [place] [address]    Report Status PENDING  Incomplete     Time coordinating discharge:  I have spent 35 minutes face to face with the patient and on the ward discussing the patients care, assessment, plan and disposition with other care  givers. >50% of the time was devoted counseling the patient about the risks and benefits of treatment/Discharge disposition and coordinating care.   SIGNED:   Damita Lack, MD  [facility] 12/30/2020, 3:01 PM   If 7PM-7AM, please contact night-coverage

## 2020-12-30 NOTE — Progress Notes (Signed)
Patient needs bariatric shower stool, bed side commode , Rolling walker due to body habitus and septic arthritis (MRSA).

## 2021-01-01 LAB — CULTURE, BLOOD (ROUTINE X 2): Culture: NO GROWTH

## 2021-01-03 ENCOUNTER — Telehealth: Payer: Self-pay

## 2021-01-03 NOTE — Telephone Encounter (Signed)
Patient calling to inquire about having a splinter in her hand removed. Advised UC or PCP.

## 2021-01-07 ENCOUNTER — Encounter: Payer: Self-pay | Admitting: Infectious Disease

## 2021-01-07 ENCOUNTER — Other Ambulatory Visit: Payer: Self-pay

## 2021-01-07 ENCOUNTER — Ambulatory Visit (INDEPENDENT_AMBULATORY_CARE_PROVIDER_SITE_OTHER): Payer: PRIVATE HEALTH INSURANCE | Admitting: Infectious Disease

## 2021-01-07 VITALS — BP 187/81 | HR 77 | Temp 98.2°F | Wt 235.0 lb

## 2021-01-07 DIAGNOSIS — L0291 Cutaneous abscess, unspecified: Secondary | ICD-10-CM | POA: Diagnosis not present

## 2021-01-07 DIAGNOSIS — M00052 Staphylococcal arthritis, left hip: Secondary | ICD-10-CM | POA: Diagnosis not present

## 2021-01-07 DIAGNOSIS — Z22322 Carrier or suspected carrier of Methicillin resistant Staphylococcus aureus: Secondary | ICD-10-CM

## 2021-01-07 HISTORY — DX: Cutaneous abscess, unspecified: L02.91

## 2021-01-07 NOTE — Progress Notes (Signed)
Subjective:  Chief complaint: Still having some hip pain   Patient ID: Nicole Hines, female    DOB: 06-25-1961, 60 y.o.   MRN: [id number]  HPI   60 y.o. female with MRSA bacteremia and septic SI joint also with murmur,.  She had a 2D echocardiogram and a transesophageal echocardiogram which were both negative for endocarditis.  After her initial cultures on 30 April were positive for MRSA her repeat blood cultures on the third of May were not growing any organism and never did.  However would not reach day 5 of cultures being finalized.  On the day in question she had lost IV access and her pain was not capable of being controlled apparently without IV Dilaudid.  Therefore I recommended getting another set of blood cultures prior to placement of the PICC line that day.  Unfortunately that culture that was done turned out to be only a single culture and did itself again grow MRSA.  My partner Dr. Baxter Flattery and Dr. Linus Salmons was automatically consulted and saw the patient again contemplated PICC line removal.  Repeat blood cultures were done and were negative and PICC line was not removed.  My own approach would have been to remove the PICC line if necessary bridger with oral therapy prior to placing a new PICC line provided her pain could have been controlled.  In the interim she was found to have on CT scan new hypoattenuating area in the left iliac muscle concerning for a punctate foci of gas with 6 x 3.1 x 1.6 cm with concern for phlegmon near the left sacroiliac joint.  Interventional radiology attempted to aspirate this area but were unsuccessful.  She is then continued on daptomycin and was discharged to home with home IV antibiotics.  Her pain in her hip is been improving she has hospital follow-up with [facility].  She is anxious to be able to drive again and not rely on other people to take her places.   Past Medical History:  Diagnosis Date  . Amenorrhea    irreg  cycle  . Anxiety   . Arthritis   . Asthma   . MRSA infection   . Obesity     Past Surgical History:  Procedure Laterality Date  . COLONOSCOPY WITH PROPOFOL N/A 01/08/2014   Procedure: COLONOSCOPY WITH PROPOFOL;  Surgeon: [name], MD;  Location: [facility] END[facility]COPY;  Service: Endoscopy;  Laterality: N/A;  . DILATION AND CURETTAGE OF UTERUS  1990  . LAPAROSCOPIC CHOLECYSTECTOMY  1994  . mrsa     had to have area cut out. abdomen  . TEE WITHOUT CARDIOVERSION N/A 12/27/2020   Procedure: TRANSESOPHAGEAL ECHOCARDIOGRAM (TEE);  Surgeon: [name], MD;  Location: [facility] ENDOSCOPY;  Service: Cardiovascular;  Laterality: N/A;    Family History  Problem Relation Age of Onset  . Cancer Mother        pancreatic  . Diabetes Mother   . Hypertension Mother   . Heart attack Father   . Heart attack Maternal Grandmother   . Diabetes Brother   . Cancer Maternal Aunt        uterine  . Diabetes Maternal Aunt   . Colon cancer Neg Hx       Social History   Socioeconomic History  . Marital status: Divorced    Spouse name: Not on file  . Number of children: Not on file  . Years of education: Not on file  . Highest education level: Not on file  Occupational History  . Not on file  Tobacco Use  . Smoking status: Never Smoker  . Smokeless tobacco: Never Used  Substance and Sexual Activity  . Alcohol use: No  . Drug use: No  . Sexual activity: Yes    Partners: Female    Birth control/protection: Surgical    Comment: btl  Other Topics Concern  . Not on file  Social History Narrative  . Not on file   Social Determinants of Health   Financial Resource Strain: Not on file  Food Insecurity: Not on file  Transportation Needs: Not on file  Physical Activity: Not on file  Stress: Not on file  Social Connections: Not on file    No Known Allergies   Current Outpatient Medications:  .  ALPRAZolam (XANAX) 0.5 MG tablet, Take 1 mg by mouth at bedtime as needed for sleep. For  anxiety, Disp: , Rfl:  .  amLODipine (NORVASC) 5 MG tablet, Take 1 tablet (5 mg total) by mouth daily., Disp: 30 tablet, Rfl: 0 .  Calcium Carbonate-Vitamin D (CALCIUM 600+D3 PO), Take 1 tablet by mouth 2 (two) times daily., Disp: , Rfl:  .  citalopram (CELEXA) 40 MG tablet, Take 40 mg by mouth every morning., Disp: , Rfl:  .  clobetasol cream (TEMOVATE) 9.02 %, Apply 1 application topically at bedtime., Disp: , Rfl:  .  daptomycin (CUBICIN) IVPB, Inject 800 mg into the vein daily. Indication: MRSA bacteremia and infection of sacroiliac joint First Dose: Yes Last Day of Therapy:  02/14/21 Labs - Once weekly:  CBC/D, BMP, CPK, ESR and CRP Method of administration: IV Push Method of administration may be changed at the discretion of home infusion pharmacist based upon assessment of the patient and/or caregiver's ability to self-administer the medication ordered., Disp: 52 Units, Rfl: 0 .  docusate sodium (COLACE) 100 MG capsule, Take 1 capsule (100 mg total) by mouth daily as needed for mild constipation or moderate constipation., Disp: 30 capsule, Rfl: 2 .  fish oil-omega-3 fatty acids 1000 MG capsule, Take 1 g by mouth 2 (two) times daily., Disp: , Rfl:  .  Fluticasone-Salmeterol (ADVAIR) 250-50 MCG/DOSE AEPB, Inhale 2 puffs into the lungs 2 (two) times daily as needed (sob/wheezing)., Disp: , Rfl:  .  gabapentin (NEURONTIN) 100 MG capsule, Take 300 mg by mouth at bedtime., Disp: , Rfl:  .  Glucosamine-Chondroit-Vit C-Mn (GLUCOSAMINE 1500 COMPLEX PO), Take 1 capsule by mouth 2 (two) times daily., Disp: , Rfl:  .  methocarbamol (ROBAXIN) 500 MG tablet, Take 1 tablet (500 mg total) by mouth every 8 (eight) hours as needed for muscle spasms., Disp: 30 tablet, Rfl: 0 .  montelukast (SINGULAIR) 10 MG tablet, Take 10 mg by mouth every morning. , Disp: , Rfl:  .  Multiple Vitamin (MULTIVITAMIN WITH MINERALS) TABS tablet, Take 1 tablet by mouth daily., Disp: , Rfl:  .  naproxen (NAPROSYN) 500 MG tablet, Take 1  tablet (500 mg total) by mouth 2 (two) times daily with a meal for 10 days., Disp: 20 tablet, Rfl: 0 .  omeprazole (PRILOSEC) 20 MG capsule, Take 1 capsule (20 mg total) by mouth daily before breakfast., Disp: 30 capsule, Rfl: 0 .  oxyCODONE-acetaminophen (PERCOCET) 10-325 MG per tablet, Take 1 tablet by mouth every 6 (six) hours as needed for pain. , Disp: , Rfl:  .  polyethylene glycol (MIRALAX / GLYCOLAX) 17 g packet, Take 17 g by mouth daily as needed for severe constipation., Disp: 14 each, Rfl: 2 .  senna-docusate (SENOKOT-S) 8.6-50 MG tablet, Take 1 tablet by mouth 2 (two) times daily., Disp: 60 tablet, Rfl: 0 .  vitamin B-12 (CYANOCOBALAMIN) 1000 MCG tablet, Take 1,000 mcg by mouth daily., Disp: , Rfl:     Review of Systems  Constitutional: Negative for activity change, appetite change, chills, diaphoresis, fatigue, fever and unexpected weight change.  HENT: Negative for congestion, rhinorrhea, sinus pressure, sneezing, sore throat and trouble swallowing.   Eyes: Negative for photophobia and visual disturbance.  Respiratory: Negative for cough, chest tightness, shortness of breath, wheezing and stridor.   Cardiovascular: Negative for chest pain, palpitations and leg swelling.  Gastrointestinal: Negative for abdominal distention, abdominal pain, anal bleeding, blood in stool, constipation, diarrhea, nausea and vomiting.  Genitourinary: Negative for difficulty urinating, dysuria, flank pain and hematuria.  Musculoskeletal: Positive for back pain, gait problem and myalgias. Negative for arthralgias and joint swelling.  Skin: Negative for color change, pallor, rash and wound.  Neurological: Negative for dizziness, tremors, weakness and light-headedness.  Hematological: Negative for adenopathy. Does not bruise/bleed easily.  Psychiatric/Behavioral: Negative for agitation, behavioral problems, confusion, decreased concentration, dysphoric mood and sleep disturbance.       Objective:    Physical Exam Constitutional:      General: She is not in acute distress.    Appearance: Normal appearance. She is well-developed. She is not ill-appearing or diaphoretic.  HENT:     Head: Normocephalic and atraumatic.     Right Ear: Hearing and external ear normal.     Left Ear: Hearing and external ear normal.     Nose: No nasal deformity or rhinorrhea.  Eyes:     General: No scleral icterus.    Conjunctiva/sclera: Conjunctivae normal.     Right eye: Right conjunctiva is not injected.     Left eye: Left conjunctiva is not injected.  Neck:     Vascular: No JVD.  Cardiovascular:     Rate and Rhythm: Normal rate and regular rhythm.     Heart sounds: S1 normal and S2 normal. Murmur heard.  No friction rub.  Pulmonary:     Effort: Pulmonary effort is normal. No respiratory distress.     Breath sounds: Normal breath sounds. No stridor. No wheezing or rhonchi.  Abdominal:     General: Bowel sounds are normal. There is no distension.     Palpations: Abdomen is soft.     Tenderness: There is no abdominal tenderness.  Musculoskeletal:     Right shoulder: Normal.     Left shoulder: Normal.     Cervical back: Normal range of motion and neck supple.     Right hip: Normal.     Left hip: Normal.     Right knee: Normal.     Left knee: Normal.  Lymphadenopathy:     Head:     Right side of head: No submandibular, preauricular or posterior auricular adenopathy.     Left side of head: No submandibular, preauricular or posterior auricular adenopathy.     Cervical: No cervical adenopathy.     Right cervical: No superficial or deep cervical adenopathy.    Left cervical: No superficial or deep cervical adenopathy.  Skin:    General: Skin is warm and dry.     Coloration: Skin is not pale.     Findings: No abrasion, bruising, ecchymosis, erythema, lesion or rash.     Nails: There is no clubbing.  Neurological:     General: No focal deficit present.     Mental Status:  She is alert and  oriented to person, place, and time.     Sensory: No sensory deficit.     Coordination: Coordination normal.     Gait: Gait normal.  Psychiatric:        Attention and Perception: She is attentive.        Speech: Speech normal.        Behavior: Behavior normal. Behavior is cooperative.        Thought Content: Thought content normal.        Judgment: Judgment normal.    PICC line site 01/07/2021:      Still with limitation of movement of left hip    Assessment & Plan:  MRSA bacteremia complicated by septic SI joint and adjacent phlegmon  She did have PICC line placed a day that blood cultures were done again and were positive.   Now that she has been on antibiotics for some time I will leave the PICC line in place in hopes that this line was not seeded.  We will have her complete her parenteral therapy and I would likely follow that with oral therapy.  Scheduled to see me again in early June.  I spent greater than 40 minutes with the patient including greater than 50% of time in face to face counsel of the patient reading her radiographs reviewing laboratory and microbiological data and in coordination of her care.

## 2021-01-28 ENCOUNTER — Ambulatory Visit (INDEPENDENT_AMBULATORY_CARE_PROVIDER_SITE_OTHER): Payer: PRIVATE HEALTH INSURANCE | Admitting: Infectious Disease

## 2021-01-28 ENCOUNTER — Other Ambulatory Visit: Payer: Self-pay

## 2021-01-28 VITALS — BP 119/79 | HR 89 | Temp 97.5°F | Resp 16 | Ht 68.0 in | Wt 234.0 lb

## 2021-01-28 DIAGNOSIS — M00052 Staphylococcal arthritis, left hip: Secondary | ICD-10-CM

## 2021-01-28 DIAGNOSIS — M533 Sacrococcygeal disorders, not elsewhere classified: Secondary | ICD-10-CM | POA: Diagnosis not present

## 2021-01-28 DIAGNOSIS — Z22322 Carrier or suspected carrier of Methicillin resistant Staphylococcus aureus: Secondary | ICD-10-CM | POA: Diagnosis not present

## 2021-01-28 DIAGNOSIS — G8929 Other chronic pain: Secondary | ICD-10-CM

## 2021-01-28 DIAGNOSIS — M8608 Acute hematogenous osteomyelitis, other sites: Secondary | ICD-10-CM

## 2021-01-28 DIAGNOSIS — L0291 Cutaneous abscess, unspecified: Secondary | ICD-10-CM

## 2021-01-28 MED ORDER — DOXYCYCLINE HYCLATE 100 MG PO TABS: 100.0000 mg | ORAL_TABLET | Freq: Two times a day (BID) | ORAL | 1 refills | Status: DC

## 2021-01-28 NOTE — Progress Notes (Signed)
Subjective:  Chief complaint: Having some hip pain and also pain that goes into her groin that she attributes to the attempt to aspirate the phlegmon   Patient ID: Nicole Hines, female    DOB: [birth year], 60 y.o.   MRN: [id number]  HPI   60 y.o. female with MRSA bacteremia and septic SI joint also with murmur,.  She had a 2D echocardiogram and a transesophageal echocardiogram which were both negative for endocarditis.  After her initial cultures on 30 April were positive for MRSA her repeat blood cultures on the third of May were not growing any organism and never did.  However would not reach day 5 of cultures being finalized.  On the day in question she had lost IV access and her pain was not capable of being controlled apparently without IV Dilaudid.  Therefore I recommended getting another set of blood cultures prior to placement of the PICC line that day.  Unfortunately that culture that was done turned out to be only a single culture and did itself again grow MRSA.  My partner Dr. Baxter Flattery and Dr. Linus Salmons was automatically consulted and saw the patient again contemplated PICC line removal.  Repeat blood cultures were done and were negative and PICC line was not removed.  My own approach would have been to remove the PICC line if necessary bridger with oral therapy prior to placing a new PICC line provided her pain could have been controlled.  In the interim she was found to have on CT scan new hypoattenuating area in the left iliac muscle concerning for a punctate foci of gas with 6 x 3.1 x 1.6 cm with concern for phlegmon near the left sacroiliac joint.  Interventional radiology attempted to aspirate this area but were unsuccessful.  She is then continued on daptomycin and was discharged to home with home IV antibiotics.  Hip pain is continue to improve and she has been able to do more more activities and she is eager to return to work soon.  She still has some pain though in her  groin that she attributes to the attempt to aspirate the phlegmon.        Past Medical History:  Diagnosis Date   Amenorrhea    irreg cycle   Anxiety    Arthritis    Asthma    MRSA infection    Obesity    Phlegmon 01/07/2021    Past Surgical History:  Procedure Laterality Date   COLONOSCOPY WITH PROPOFOL N/A 01/08/2014   Procedure: COLONOSCOPY WITH PROPOFOL;  Surgeon: [name], MD;  Location: [facility] ENDOSCOPY;  Service: Endoscopy;  Laterality: N/A;   DILATION AND CURETTAGE OF UTERUS  1990   LAPAROSCOPIC CHOLECYSTECTOMY  1994   mrsa     had to have area cut out. abdomen   TEE WITHOUT CARDIOVERSION N/A 12/27/2020   Procedure: TRANSESOPHAGEAL ECHOCARDIOGRAM (TEE);  Surgeon: [name], MD;  Location: [facility] ENDOSCOPY;  Service: Cardiovascular;  Laterality: N/A;    Family History  Problem Relation Age of Onset   Cancer Mother        pancreatic   Diabetes Mother    Hypertension Mother    Heart attack Father    Heart attack Maternal Grandmother    Diabetes Brother    Cancer Maternal Aunt        uterine   Diabetes Maternal Aunt    Colon cancer Neg Hx       Social History   Socioeconomic History  Marital status: Divorced    Spouse name: Not on file   Number of children: Not on file   Years of education: Not on file   Highest education level: Not on file  Occupational History   Not on file  Tobacco Use   Smoking status: Never   Smokeless tobacco: Never  Substance and Sexual Activity   Alcohol use: No   Drug use: No   Sexual activity: Yes    Partners: Female    Birth control/protection: Surgical    Comment: btl  Other Topics Concern   Not on file  Social History Narrative   Not on file   Social Determinants of Health   Financial Resource Strain: Not on file  Food Insecurity: Not on file  Transportation Needs: Not on file  Physical Activity: Not on file  Stress: Not on file  Social Connections: Not on file    No Known Allergies   Current  Outpatient Medications:    ALPRAZolam (XANAX) 0.5 MG tablet, Take 1 mg by mouth at bedtime as needed for sleep. For anxiety, Disp: , Rfl:    amLODipine (NORVASC) 5 MG tablet, Take 1 tablet (5 mg total) by mouth daily., Disp: 30 tablet, Rfl: 0   Calcium Carbonate-Vitamin D (CALCIUM 600+D3 PO), Take 1 tablet by mouth 2 (two) times daily., Disp: , Rfl:    citalopram (CELEXA) 40 MG tablet, Take 40 mg by mouth every morning., Disp: , Rfl:    clobetasol cream (TEMOVATE) 7.90 %, Apply 1 application topically at bedtime., Disp: , Rfl:    COVID-19 mRNA vaccine, Moderna, 100 MCG/0.5ML injection, Moderna COVID-19 Vaccine (PF) 100 mcg/0.5 mL intramuscular susp. (EUA)  TO BE ADMINISTERED BY PHARMACIST FOR IMMUNIZATION, Disp: , Rfl:    daptomycin (CUBICIN) IVPB, Inject 800 mg into the vein daily. Indication: MRSA bacteremia and infection of sacroiliac joint First Dose: Yes Last Day of Therapy:  02/14/21 Labs - Once weekly:  CBC/D, BMP, CPK, ESR and CRP Method of administration: IV Push Method of administration may be changed at the discretion of home infusion pharmacist based upon assessment of the patient and/or caregiver's ability to self-administer the medication ordered., Disp: 52 Units, Rfl: 0   docusate sodium (COLACE) 100 MG capsule, Take 1 capsule (100 mg total) by mouth daily as needed for mild constipation or moderate constipation., Disp: 30 capsule, Rfl: 2   fish oil-omega-3 fatty acids 1000 MG capsule, Take 1 g by mouth 2 (two) times daily., Disp: , Rfl:    Fluticasone-Salmeterol (ADVAIR) 250-50 MCG/DOSE AEPB, Inhale 2 puffs into the lungs 2 (two) times daily as needed (sob/wheezing)., Disp: , Rfl:    gabapentin (NEURONTIN) 100 MG capsule, Take 300 mg by mouth at bedtime., Disp: , Rfl:    Glucosamine-Chondroit-Vit C-Mn (GLUCOSAMINE 1500 COMPLEX PO), Take 1 capsule by mouth 2 (two) times daily., Disp: , Rfl:    methocarbamol (ROBAXIN) 500 MG tablet, Take 1 tablet (500 mg total) by mouth every 8 (eight)  hours as needed for muscle spasms., Disp: 30 tablet, Rfl: 0   montelukast (SINGULAIR) 10 MG tablet, Take 10 mg by mouth every morning. , Disp: , Rfl:    Multiple Vitamin (MULTIVITAMIN WITH MINERALS) TABS tablet, Take 1 tablet by mouth daily., Disp: , Rfl:    omeprazole (PRILOSEC) 20 MG capsule, Take 1 capsule (20 mg total) by mouth daily before breakfast., Disp: 30 capsule, Rfl: 0   oxyCODONE-acetaminophen (PERCOCET) 10-325 MG per tablet, Take 1 tablet by mouth every 6 (six) hours as needed  for pain. , Disp: , Rfl:    polyethylene glycol (MIRALAX / GLYCOLAX) 17 g packet, Take 17 g by mouth daily as needed for severe constipation., Disp: 14 each, Rfl: 2   senna-docusate (SENOKOT-S) 8.6-50 MG tablet, Take 1 tablet by mouth 2 (two) times daily., Disp: 60 tablet, Rfl: 0   vitamin B-12 (CYANOCOBALAMIN) 1000 MCG tablet, Take 1,000 mcg by mouth daily., Disp: , Rfl:     Review of Systems  Constitutional:  Negative for activity change, appetite change, chills, diaphoresis, fatigue, fever and unexpected weight change.  HENT:  Negative for congestion, rhinorrhea, sinus pressure, sneezing, sore throat and trouble swallowing.   Eyes:  Negative for photophobia and visual disturbance.  Respiratory:  Negative for cough, chest tightness, shortness of breath, wheezing and stridor.   Cardiovascular:  Negative for chest pain, palpitations and leg swelling.  Gastrointestinal:  Negative for abdominal distention, abdominal pain, anal bleeding, blood in stool, constipation, diarrhea, nausea and vomiting.  Genitourinary:  Positive for pelvic pain. Negative for difficulty urinating, dysuria, flank pain and hematuria.  Musculoskeletal:  Positive for back pain and myalgias. Negative for arthralgias and joint swelling.  Skin:  Negative for color change, pallor, rash and wound.  Neurological:  Negative for dizziness, tremors, weakness and light-headedness.  Hematological:  Negative for adenopathy. Does not bruise/bleed  easily.  Psychiatric/Behavioral:  Negative for agitation, behavioral problems, confusion, decreased concentration, dysphoric mood and sleep disturbance.       Objective:   Physical Exam Constitutional:      General: She is not in acute distress.    Appearance: Normal appearance. She is well-developed. She is not ill-appearing or diaphoretic.  HENT:     Head: Normocephalic and atraumatic.     Right Ear: Hearing and external ear normal.     Left Ear: Hearing and external ear normal.     Nose: No nasal deformity or rhinorrhea.  Eyes:     General: No scleral icterus.    Conjunctiva/sclera: Conjunctivae normal.     Right eye: Right conjunctiva is not injected.     Left eye: Left conjunctiva is not injected.  Neck:     Vascular: No JVD.  Cardiovascular:     Rate and Rhythm: Normal rate and regular rhythm.     Heart sounds: S1 normal and S2 normal. Murmur heard.    No friction rub.  Pulmonary:     Effort: Pulmonary effort is normal. No respiratory distress.     Breath sounds: Normal breath sounds. No stridor. No wheezing or rhonchi.  Abdominal:     General: Bowel sounds are normal. There is no distension.     Palpations: Abdomen is soft.     Tenderness: There is no abdominal tenderness.  Musculoskeletal:     Right shoulder: Normal.     Left shoulder: Normal.     Cervical back: Normal range of motion and neck supple.     Right hip: Normal.     Left hip: Tenderness present. Decreased range of motion.     Right knee: Normal.     Left knee: Normal.  Lymphadenopathy:     Head:     Right side of head: No submandibular, preauricular or posterior auricular adenopathy.     Left side of head: No submandibular, preauricular or posterior auricular adenopathy.     Cervical: No cervical adenopathy.     Right cervical: No superficial or deep cervical adenopathy.    Left cervical: No superficial or deep cervical adenopathy.  Skin:  General: Skin is warm and dry.     Coloration: Skin is  not pale.     Findings: No abrasion, bruising, ecchymosis, erythema, lesion or rash.     Nails: There is no clubbing.  Neurological:     General: No focal deficit present.     Mental Status: She is alert and oriented to person, place, and time.     Sensory: No sensory deficit.     Coordination: Coordination normal.     Gait: Gait normal.  Psychiatric:        Attention and Perception: She is attentive.        Speech: Speech normal.        Behavior: Behavior normal. Behavior is cooperative.        Thought Content: Thought content normal.        Judgment: Judgment normal.    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  We will have her complete continue her daptomycinto the stop date on the 27th and have the PICC line pulled at that time.  We will follow that with oral doxycycline twice daily.  In the interim also ordering an MRI of her hip to reassess the area of phlegmonous changes seen previously.  I spent more than 40 minutes with the patient including greater than 50% of time in face to face counseling of the patient personally reviewing radiographs, long with pertinent laboratory microbiological data review of medical records and in coordination of her care with home health.

## 2021-02-04 ENCOUNTER — Ambulatory Visit (HOSPITAL_COMMUNITY)
Admission: RE | Admit: 2021-02-04 | Discharge: 2021-02-04 | Disposition: A | Discharge status: Home / Self Care | Payer: PRIVATE HEALTH INSURANCE | Source: Ambulatory Visit | Attending: Infectious Disease | Admitting: Infectious Disease

## 2021-02-04 ENCOUNTER — Other Ambulatory Visit: Payer: Self-pay

## 2021-02-04 ENCOUNTER — Telehealth: Payer: Self-pay

## 2021-02-04 DIAGNOSIS — M8608 Acute hematogenous osteomyelitis, other sites: Secondary | ICD-10-CM | POA: Insufficient documentation

## 2021-02-04 MED ORDER — GADOBUTROL 1 MMOL/ML IV SOLN
10.0000 mL | Freq: Once | INTRAVENOUS | Status: AC | PRN
  Administered 2021-02-04: 10 mL via INTRAVENOUS

## 2021-02-04 NOTE — Telephone Encounter (Signed)
On treatment until planned 6/27 iv dapto then plan for po doxy  No abscess.   Hx mrsa bsi   Clinically improving per dr Jac Canavan' note 7 days ago   Can continue treatment and follow up qith dr Jac Canavan in around 3 weeks

## 2021-02-04 NOTE — Telephone Encounter (Signed)
Received call from [facility] radiology about MRI that was completed this morning. Forwarding to covering provider to review.   Jaquanda Wickersham Lorita Officer, RN

## 2021-02-15 ENCOUNTER — Telehealth: Payer: Self-pay

## 2021-02-15 NOTE — Telephone Encounter (Signed)
[facility] calling for pull picc orders. Noted orders in last office visit and OPAT to pull picc at the end of therapy. Verbal orders read back and understood. Eugenia Mcalpine

## 2021-03-04 ENCOUNTER — Ambulatory Visit (INDEPENDENT_AMBULATORY_CARE_PROVIDER_SITE_OTHER): Payer: PRIVATE HEALTH INSURANCE | Admitting: Infectious Disease

## 2021-03-04 ENCOUNTER — Other Ambulatory Visit: Payer: Self-pay

## 2021-03-04 ENCOUNTER — Encounter: Payer: Self-pay | Admitting: Infectious Disease

## 2021-03-04 VITALS — BP 137/78 | HR 74 | Temp 98.1°F | Ht 65.0 in | Wt 250.0 lb

## 2021-03-04 DIAGNOSIS — Z22322 Carrier or suspected carrier of Methicillin resistant Staphylococcus aureus: Secondary | ICD-10-CM

## 2021-03-04 DIAGNOSIS — M4628 Osteomyelitis of vertebra, sacral and sacrococcygeal region: Secondary | ICD-10-CM

## 2021-03-04 DIAGNOSIS — L0291 Cutaneous abscess, unspecified: Secondary | ICD-10-CM

## 2021-03-04 DIAGNOSIS — I330 Acute and subacute infective endocarditis: Secondary | ICD-10-CM | POA: Diagnosis not present

## 2021-03-04 DIAGNOSIS — M00052 Staphylococcal arthritis, left hip: Secondary | ICD-10-CM | POA: Diagnosis not present

## 2021-03-04 DIAGNOSIS — G8929 Other chronic pain: Secondary | ICD-10-CM

## 2021-03-04 DIAGNOSIS — M533 Sacrococcygeal disorders, not elsewhere classified: Secondary | ICD-10-CM

## 2021-03-04 DIAGNOSIS — M609 Myositis, unspecified: Secondary | ICD-10-CM

## 2021-03-04 HISTORY — DX: Osteomyelitis of vertebra, sacral and sacrococcygeal region: M46.28

## 2021-03-04 HISTORY — DX: Myositis, unspecified: M60.9

## 2021-03-04 MED ORDER — DOXYCYCLINE HYCLATE 100 MG PO TABS: 100.0000 mg | ORAL_TABLET | Freq: Two times a day (BID) | ORAL | 11 refills | Status: DC

## 2021-03-04 NOTE — Progress Notes (Signed)
Subjective:  Chief complaint: Continued left hip pain as well as pain in her left buttocks that radiates down her leg.   Patient ID: Nicole Hines, female    DOB: 1961/04/07, 60 y.o.   MRN: [id number]  HPI   60 y.o. female with MRSA bacteremia and septic SI joint also with murmur,.  She had a 2D echocardiogram and a transesophageal echocardiogram which were both negative for endocarditis.  After her initial cultures on 30 April were positive for MRSA her repeat blood cultures on the third of May were not growing any organism and never did.  However would not reach day 5 of cultures being finalized.  On the day in question she had lost IV access and her pain was not capable of being controlled apparently without IV Dilaudid.  Therefore I recommended getting another set of blood cultures prior to placement of the PICC line that day.  Unfortunately that culture that was done turned out to be only a single culture and did itself again grow MRSA.  My partner Dr. Baxter Flattery and Dr. Linus Salmons was automatically consulted and saw the patient again contemplated PICC line removal.  Repeat blood cultures were done and were negative and PICC line was not removed.  My own approach would have been to remove the PICC line if necessary bridger with oral therapy prior to placing a new PICC line provided her pain could have been controlled.  In the interim she was found to have on CT scan new hypoattenuating area in the left iliac muscle concerning for a punctate foci of gas with 6 x 3.1 x 1.6 cm with concern for phlegmon near the left sacroiliac joint.  Interventional radiology attempted to aspirate this area but were unsuccessful.  She is then continued on daptomycin and was discharged to home with home IV antibiotics.  Hip pain  had continued to improve when I saw her last but still was having some pain in her groin that she attributed to the attempt to aspirate the phlegmon   I ordered an MRI of her hip to  reassess the area of prior phlegmonous changes.  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  Patient is continued on doxycycline in the interim.  Pain seems relatively stable versus the last time I saw her.  She has seen orthopedic surgery but I do not know if they are aware of her most recent MRI findings I doubt that they would try to entertain a surgical intervention for the findings.           Past Medical History:  Diagnosis Date   Amenorrhea    irreg cycle   Anxiety    Arthritis    Asthma    MRSA infection    Obesity    Phlegmon 01/07/2021    Past Surgical History:  Procedure Laterality Date   COLONOSCOPY WITH PROPOFOL N/A 01/08/2014   Procedure: COLONOSCOPY WITH PROPOFOL;  Surgeon: [name], MD;  Location: [facility] ENDOSCOPY;  Service: Endoscopy;  Laterality: N/A;   DILATION AND CURETTAGE OF UTERUS  1990   LAPAROSCOPIC CHOLECYSTECTOMY  1994   mrsa     had to have area cut out. abdomen   TEE WITHOUT CARDIOVERSION N/A 12/27/2020  Procedure: TRANSESOPHAGEAL ECHOCARDIOGRAM (TEE);  Surgeon: [name], MD;  Location: [facility] ENDOSCOPY;  Service: Cardiovascular;  Laterality: N/A;    Family History  Problem Relation Age of Onset   Cancer Mother        pancreatic   Diabetes Mother    Hypertension Mother    Heart attack Father    Heart attack Maternal Grandmother    Diabetes Brother    Cancer Maternal Aunt        uterine   Diabetes Maternal Aunt    Colon cancer Neg Hx       Social History   Socioeconomic History   Marital status: Divorced    Spouse name: Not on file   Number of children: Not on file   Years of education: Not on file   Highest  education level: Not on file  Occupational History   Not on file  Tobacco Use   Smoking status: Never   Smokeless tobacco: Never  Substance and Sexual Activity   Alcohol use: No   Drug use: No   Sexual activity: Yes    Partners: Female    Birth control/protection: Surgical    Comment: btl  Other Topics Concern   Not on file  Social History Narrative   Not on file   Social Determinants of Health   Financial Resource Strain: Not on file  Food Insecurity: Not on file  Transportation Needs: Not on file  Physical Activity: Not on file  Stress: Not on file  Social Connections: Not on file    No Known Allergies   Current Outpatient Medications:    ALPRAZolam (XANAX) 0.5 MG tablet, Take 1 mg by mouth at bedtime as needed for sleep. For anxiety, Disp: , Rfl:    amLODipine (NORVASC) 5 MG tablet, Take 1 tablet (5 mg total) by mouth daily., Disp: 30 tablet, Rfl: 0   Calcium Carbonate-Vitamin D (CALCIUM 600+D3 PO), Take 1 tablet by mouth 2 (two) times daily., Disp: , Rfl:    citalopram (CELEXA) 40 MG tablet, Take 40 mg by mouth every morning., Disp: , Rfl:    clobetasol cream (TEMOVATE) 5.18 %, Apply 1 application topically at bedtime., Disp: , Rfl:    docusate sodium (COLACE) 100 MG capsule, Take 1 capsule (100 mg total) by mouth daily as needed for mild constipation or moderate constipation., Disp: 30 capsule, Rfl: 2   doxycycline (VIBRA-TABS) 100 MG tablet, Take 1 tablet (100 mg total) by mouth 2 (two) times daily., Disp: 60 tablet, Rfl: 1   fish oil-omega-3 fatty acids 1000 MG capsule, Take 1 g by mouth 2 (two) times daily., Disp: , Rfl:    Fluticasone-Salmeterol (ADVAIR) 250-50 MCG/DOSE AEPB, Inhale 2 puffs into the lungs 2 (two) times daily as needed (sob/wheezing)., Disp: , Rfl:    gabapentin (NEURONTIN) 100 MG capsule, Take 300 mg by mouth at bedtime., Disp: , Rfl:    Glucosamine-Chondroit-Vit C-Mn (GLUCOSAMINE 1500 COMPLEX PO), Take 1 capsule by mouth 2 (two) times daily., Disp:  , Rfl:    methocarbamol (ROBAXIN) 500 MG tablet, Take 1 tablet (500 mg total) by mouth every 8 (eight) hours as needed for muscle spasms., Disp: 30 tablet, Rfl: 0   montelukast (SINGULAIR) 10 MG tablet, Take 10 mg by mouth every morning. , Disp: , Rfl:    Multiple Vitamin (MULTIVITAMIN WITH MINERALS) TABS tablet, Take 1 tablet by mouth daily., Disp: , Rfl:    omeprazole (PRILOSEC) 20 MG capsule, Take 1 capsule (20 mg total) by mouth daily  before breakfast., Disp: 30 capsule, Rfl: 0   oxyCODONE-acetaminophen (PERCOCET) 10-325 MG per tablet, Take 1 tablet by mouth every 6 (six) hours as needed for pain. , Disp: , Rfl:    polyethylene glycol (MIRALAX / GLYCOLAX) 17 g packet, Take 17 g by mouth daily as needed for severe constipation., Disp: 14 each, Rfl: 2   vitamin B-12 (CYANOCOBALAMIN) 1000 MCG tablet, Take 1,000 mcg by mouth daily., Disp: , Rfl:    senna-docusate (SENOKOT-S) 8.6-50 MG tablet, Take 1 tablet by mouth 2 (two) times daily. (Patient not taking: Reported on 03/04/2021), Disp: 60 tablet, Rfl: 0   telmisartan (MICARDIS) 40 MG tablet, Take 40 mg by mouth daily., Disp: , Rfl:     Review of Systems  Constitutional:  Negative for activity change, appetite change, chills, diaphoresis, fatigue, fever and unexpected weight change.  HENT:  Negative for congestion, rhinorrhea, sinus pressure, sneezing, sore throat and trouble swallowing.   Eyes:  Negative for photophobia and visual disturbance.  Respiratory:  Negative for cough, chest tightness, shortness of breath, wheezing and stridor.   Cardiovascular:  Negative for chest pain, palpitations and leg swelling.  Gastrointestinal:  Negative for abdominal distention, abdominal pain, anal bleeding, blood in stool, constipation, diarrhea, nausea and vomiting.  Genitourinary:  Negative for difficulty urinating, dysuria, flank pain, hematuria and pelvic pain.  Musculoskeletal:  Positive for arthralgias, back pain and myalgias. Negative for joint  swelling.  Skin:  Negative for color change, pallor, rash and wound.  Neurological:  Negative for dizziness, tremors, weakness and light-headedness.  Hematological:  Negative for adenopathy. Does not bruise/bleed easily.  Psychiatric/Behavioral:  Negative for agitation, behavioral problems, confusion, decreased concentration, dysphoric mood and sleep disturbance.       Objective:   Physical Exam Constitutional:      General: She is not in acute distress.    Appearance: Normal appearance. She is well-developed. She is not ill-appearing or diaphoretic.  HENT:     Head: Normocephalic and atraumatic.     Right Ear: Hearing and external ear normal.     Left Ear: Hearing and external ear normal.     Nose: No nasal deformity or rhinorrhea.  Eyes:     General: No scleral icterus.    Conjunctiva/sclera: Conjunctivae normal.     Right eye: Right conjunctiva is not injected.     Left eye: Left conjunctiva is not injected.  Neck:     Vascular: No JVD.  Cardiovascular:     Rate and Rhythm: Normal rate and regular rhythm.     Heart sounds: S1 normal and S2 normal. Murmur heard.    No friction rub.  Pulmonary:     Effort: Pulmonary effort is normal. No respiratory distress.     Breath sounds: Normal breath sounds. No stridor. No wheezing or rhonchi.  Abdominal:     General: Bowel sounds are normal. There is no distension.     Palpations: Abdomen is soft.     Tenderness: There is no abdominal tenderness.  Musculoskeletal:     Right shoulder: Normal.     Left shoulder: Normal.     Cervical back: Normal range of motion and neck supple.     Right hip: Normal.     Left hip: Decreased range of motion.     Right knee: Normal.     Left knee: Normal.     Comments: Pain in groin with external rotation of left hip  Lymphadenopathy:     Head:     Right side  of head: No submandibular, preauricular or posterior auricular adenopathy.     Left side of head: No submandibular, preauricular or posterior  auricular adenopathy.     Cervical: No cervical adenopathy.     Right cervical: No superficial or deep cervical adenopathy.    Left cervical: No superficial or deep cervical adenopathy.  Skin:    General: Skin is warm and dry.     Coloration: Skin is not pale.     Findings: No abrasion, bruising, ecchymosis, erythema, lesion or rash.     Nails: There is no clubbing.  Neurological:     General: No focal deficit present.     Mental Status: She is alert and oriented to person, place, and time.     Sensory: No sensory deficit.     Coordination: Coordination normal.     Gait: Gait normal.  Psychiatric:        Attention and Perception: She is attentive.        Mood and Affect: Mood normal.        Speech: Speech normal.        Behavior: Behavior normal. Behavior is cooperative.        Thought Content: Thought content normal.        Judgment: Judgment normal.    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  We will continue with protracted antibiotics particularly in light of osteomyelitis.  If she has worsening of pain will obtain repeat MRI and see if she has developed abscesses that can be drained.  I do not see a clear-cut orthopedic surgical intervention for her.  And given that is the case she will be treated with long-term antibiotics at least a year  I spent more than 40 minutes with the patient including face to face counseling of the patient personally reviewing radiographs, along with pertinent laboratory microbiological,  data review of medical records before and during the visit and in coordination of her  care.

## 2021-03-05 LAB — CBC WITH DIFFERENTIAL/PLATELET
Absolute Monocytes: 640 {cells}/uL (ref 200–950)
Basophils Absolute: 41 {cells}/uL (ref 0–200)
Basophils Relative: 0.5 %
Eosinophils Absolute: 287 {cells}/uL (ref 15–500)
Eosinophils Relative: 3.5 %
HCT: 36 % (ref 35.0–45.0)
Hemoglobin: 11.5 g/dL — ABNORMAL LOW (ref 11.7–15.5)
Lymphs Abs: 3018 {cells}/uL (ref 850–3900)
MCH: 29.3 pg (ref 27.0–33.0)
MCHC: 31.9 g/dL — ABNORMAL LOW (ref 32.0–36.0)
MCV: 91.6 fL (ref 80.0–100.0)
MPV: 9.6 fL (ref 7.5–12.5)
Monocytes Relative: 7.8 %
Neutro Abs: 4215 {cells}/uL (ref 1500–7800)
Neutrophils Relative %: 51.4 %
Platelets: 355 Thousand/uL (ref 140–400)
RBC: 3.93 Million/uL (ref 3.80–5.10)
RDW: 13.2 % (ref 11.0–15.0)
Total Lymphocyte: 36.8 %
WBC: 8.2 Thousand/uL (ref 3.8–10.8)

## 2021-03-05 LAB — COMPLETE METABOLIC PANEL WITHOUT GFR
AG Ratio: 1.2 (calc) (ref 1.0–2.5)
ALT: 15 U/L (ref 6–29)
AST: 21 U/L (ref 10–35)
Albumin: 4.2 g/dL (ref 3.6–5.1)
Alkaline phosphatase (APISO): 107 U/L (ref 37–153)
BUN: 13 mg/dL (ref 7–25)
CO2: 31 mmol/L (ref 20–32)
Calcium: 10.7 mg/dL — ABNORMAL HIGH (ref 8.6–10.4)
Chloride: 102 mmol/L (ref 98–110)
Creat: 0.63 mg/dL (ref 0.50–1.03)
Globulin: 3.6 g/dL (ref 1.9–3.7)
Glucose, Bld: 80 mg/dL (ref 65–99)
Potassium: 4.8 mmol/L (ref 3.5–5.3)
Sodium: 142 mmol/L (ref 135–146)
Total Bilirubin: 0.4 mg/dL (ref 0.2–1.2)
Total Protein: 7.8 g/dL (ref 6.1–8.1)
eGFR: 102 mL/min/1.73m2 (ref >=60)

## 2021-03-05 LAB — C-REACTIVE PROTEIN: CRP: 3.6 mg/L (ref <8.0)

## 2021-03-05 LAB — SEDIMENTATION RATE: Sed Rate: 41 mm/h — ABNORMAL HIGH (ref 0–30)

## 2021-05-16 ENCOUNTER — Other Ambulatory Visit: Payer: Self-pay

## 2021-05-16 ENCOUNTER — Ambulatory Visit
Admission: RE | Admit: 2021-05-16 | Discharge: 2021-05-16 | Disposition: A | Discharge status: Home / Self Care | Payer: PRIVATE HEALTH INSURANCE | Source: Ambulatory Visit | Attending: Infectious Disease | Admitting: Infectious Disease

## 2021-05-16 ENCOUNTER — Ambulatory Visit (INDEPENDENT_AMBULATORY_CARE_PROVIDER_SITE_OTHER): Payer: PRIVATE HEALTH INSURANCE | Admitting: Infectious Disease

## 2021-05-16 ENCOUNTER — Encounter: Payer: Self-pay | Admitting: Infectious Disease

## 2021-05-16 VITALS — BP 139/76 | HR 77 | Temp 97.7°F | Wt 251.0 lb

## 2021-05-16 DIAGNOSIS — Z22322 Carrier or suspected carrier of Methicillin resistant Staphylococcus aureus: Secondary | ICD-10-CM

## 2021-05-16 DIAGNOSIS — G8929 Other chronic pain: Secondary | ICD-10-CM

## 2021-05-16 DIAGNOSIS — M542 Cervicalgia: Secondary | ICD-10-CM

## 2021-05-16 DIAGNOSIS — M4628 Osteomyelitis of vertebra, sacral and sacrococcygeal region: Secondary | ICD-10-CM | POA: Diagnosis not present

## 2021-05-16 DIAGNOSIS — M533 Sacrococcygeal disorders, not elsewhere classified: Secondary | ICD-10-CM

## 2021-05-16 DIAGNOSIS — M00052 Staphylococcal arthritis, left hip: Secondary | ICD-10-CM

## 2021-05-16 HISTORY — DX: Cervicalgia: M54.2

## 2021-05-16 NOTE — Progress Notes (Signed)
Subjective:  Chief complaint: Pain in her neck which is bothering her more now her leg.   Patient ID: Nicole Hines, female    DOB: 11/29/1960, 60 y.o.   MRN: [id number]  HPI   60 y.o. female with MRSA bacteremia and septic SI joint also with murmur,.  She had a 2D echocardiogram and a transesophageal echocardiogram which were both negative for endocarditis.  After her initial cultures on 30 April were positive for MRSA her repeat blood cultures on the third of May were not growing any organism and never did.  However would not reach day 5 of cultures being finalized.  On the day in question she had lost IV access and her pain was not capable of being controlled apparently without IV Dilaudid.  Therefore I recommended getting another set of blood cultures prior to placement of the PICC line that day.  Unfortunately that culture that was done turned out to be only a single culture and did itself again grow MRSA.  My partner Dr. Baxter Flattery and Dr. Linus Salmons was automatically consulted and saw the patient again contemplated PICC line removal.  Repeat blood cultures were done and were negative and PICC line was not removed.  My own approach would have been to remove the PICC line if necessary bridger with oral therapy prior to placing a new PICC line provided her pain could have been controlled.  In the interim she was found to have on CT scan new hypoattenuating area in the left iliac muscle concerning for a punctate foci of gas with 6 x 3.1 x 1.6 cm with concern for phlegmon near the left sacroiliac joint.  Interventional radiology attempted to aspirate this area but were unsuccessful.  She is then continued on daptomycin and was discharged to home with home IV antibiotics.  Hip pain  had continued to improve when I saw her last but still was having some pain in her groin that she attributed to the attempt to aspirate the phlegmon   I ordered an MRI of her hip to reassess the area of prior  phlegmonous changes.  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  Patient continued on doxycycline in the interim.  I last saw her inflammatory markers did come down.  She now is telling me about pain in her neck that she says she has had since she was hospitalized particular if she tries to turn her head to the right to look behind her.  She does have more limited range of motion turning her head to the right she does not have tenderness over sternoclavicular joint of the muscles themselves the shoulder joint range of motion is             Past Medical History:  Diagnosis Date  . Amenorrhea    irreg cycle  . Anxiety   . Arthritis   . Asthma   . MRSA infection   . Myofascitis 03/04/2021  . Obesity   . Phlegmon 01/07/2021  . 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 COLONOSCOPY WITH PROPOFOL N/A 01/08/2014   Procedure: COLONOSCOPY WITH PROPOFOL;  Surgeon: [name], MD;  Location: [facility];  Service: Endoscopy;  Laterality: N/A;  . DILATION AND CURETTAGE OF UTERUS  1990  . LAPAROSCOPIC CHOLECYSTECTOMY  1994  . mrsa     had to have area cut out. abdomen  . TEE WITHOUT CARDIOVERSION N/A 12/27/2020   Procedure: TRANSESOPHAGEAL ECHOCARDIOGRAM (TEE);  Surgeon: [name], MD;  Location: [facility];  Service: Cardiovascular;  Laterality: N/A;    Family History  Problem Relation Age of Onset  . Cancer Mother        pancreatic  . Diabetes Mother   . Hypertension Mother   . Heart attack Father   . Heart attack Maternal Grandmother   . Diabetes Brother   . Cancer Maternal Aunt        uterine  .  Diabetes Maternal Aunt   . Colon cancer Neg Hx       Social History   Socioeconomic History  . Marital status: Divorced    Spouse name: Not on file  . Number of children: Not on file  . Years of education: Not on file  . Highest education level: Not on file  Occupational History  . Not on file  Tobacco Use  . Smoking status: Never  . Smokeless tobacco: Never  Substance and Sexual Activity  . Alcohol use: No  . Drug use: No  . Sexual activity: Yes    Partners: Female    Birth control/protection: Surgical    Comment: btl  Other Topics Concern  . Not on file  Social History Narrative  . Not on file   Social Determinants of Health   Financial Resource Strain: Not on file  Food Insecurity: Not on file  Transportation Needs: Not on file  Physical Activity: Not on file  Stress: Not on file  Social Connections: Not on file    No Known Allergies   Current Outpatient Medications:  .  ALPRAZolam (XANAX) 0.5 MG tablet, Take 1 mg by mouth at bedtime as needed for sleep. For anxiety, Disp: , Rfl:  .  amLODipine (NORVASC) 5 MG tablet, Take 1 tablet (5 mg total) by mouth daily., Disp: 30 tablet, Rfl: 0 .  Calcium Carbonate-Vitamin D (CALCIUM 600+D3 PO), Take 1 tablet by mouth 2 (two) times daily., Disp: , Rfl:  .  citalopram (CELEXA) 40 MG tablet, Take 40 mg by mouth every morning., Disp: , Rfl:  .  clobetasol cream (TEMOVATE) 4.00 %, Apply 1 application topically at bedtime., Disp: , Rfl:  .  docusate sodium (COLACE) 100 MG capsule, Take 1 capsule (100 mg total) by mouth daily as needed for mild constipation or moderate constipation., Disp: 30 capsule, Rfl: 2 .  doxycycline (VIBRA-TABS) 100 MG tablet, Take 1 tablet (100 mg total) by mouth 2 (two) times daily., Disp: 60 tablet, Rfl: 11 .  fish oil-omega-3 fatty acids 1000 MG capsule, Take 1 g by mouth 2 (two) times daily., Disp: , Rfl:  .  Fluticasone-Salmeterol (ADVAIR) 250-50 MCG/DOSE AEPB, Inhale 2 puffs into the lungs 2 (two)  times daily as needed (sob/wheezing)., Disp: , Rfl:  .  gabapentin (NEURONTIN) 100 MG capsule, Take 300 mg by mouth at bedtime., Disp: , Rfl:  .  Glucosamine-Chondroit-Vit C-Mn (GLUCOSAMINE 1500 COMPLEX PO), Take 1 capsule by mouth 2 (two) times daily., Disp: , Rfl:  .  methocarbamol (ROBAXIN) 500 MG tablet, Take 1 tablet (500 mg total) by mouth every 8 (eight) hours as needed for muscle spasms., Disp: 30 tablet, Rfl: 0 .  montelukast (SINGULAIR) 10 MG tablet, Take 10 mg by mouth every morning. ,  Disp: , Rfl:  .  Multiple Vitamin (MULTIVITAMIN WITH MINERALS) TABS tablet, Take 1 tablet by mouth daily., Disp: , Rfl:  .  omeprazole (PRILOSEC) 20 MG capsule, Take 1 capsule (20 mg total) by mouth daily before breakfast., Disp: 30 capsule, Rfl: 0 .  oxyCODONE-acetaminophen (PERCOCET) 10-325 MG per tablet, Take 1 tablet by mouth every 6 (six) hours as needed for pain. , Disp: , Rfl:  .  polyethylene glycol (MIRALAX / GLYCOLAX) 17 g packet, Take 17 g by mouth daily as needed for severe constipation., Disp: 14 each, Rfl: 2 .  senna-docusate (SENOKOT-S) 8.6-50 MG tablet, Take 1 tablet by mouth 2 (two) times daily., Disp: 60 tablet, Rfl: 0 .  telmisartan (MICARDIS) 40 MG tablet, Take 40 mg by mouth daily., Disp: , Rfl:  .  vitamin B-12 (CYANOCOBALAMIN) 1000 MCG tablet, Take 1,000 mcg by mouth daily., Disp: , Rfl:     Review of Systems  Constitutional:  Negative for activity change, appetite change, chills, diaphoresis, fatigue, fever and unexpected weight change.  HENT:  Negative for congestion, rhinorrhea, sinus pressure, sneezing, sore throat and trouble swallowing.   Eyes:  Negative for photophobia and visual disturbance.  Respiratory:  Negative for cough, chest tightness, shortness of breath, wheezing and stridor.   Cardiovascular:  Negative for chest pain, palpitations and leg swelling.  Gastrointestinal:  Negative for abdominal distention, abdominal pain, anal bleeding, blood in stool, constipation,  diarrhea, nausea and vomiting.  Genitourinary:  Negative for difficulty urinating, dysuria, flank pain and hematuria.  Musculoskeletal:  Positive for back pain, myalgias and neck pain. Negative for arthralgias, gait problem and joint swelling.  Skin:  Negative for color change, pallor, rash and wound.  Neurological:  Negative for dizziness, tremors, weakness and light-headedness.  Hematological:  Negative for adenopathy. Does not bruise/bleed easily.  Psychiatric/Behavioral:  Negative for agitation, behavioral problems, confusion, decreased concentration, dysphoric mood and sleep disturbance.       Objective:   Physical Exam Constitutional:      General: She is not in acute distress.    Appearance: Normal appearance. She is well-developed. She is not ill-appearing or diaphoretic.  HENT:     Head: Normocephalic and atraumatic.     Right Ear: Hearing and external ear normal.     Left Ear: Hearing and external ear normal.     Nose: No nasal deformity or rhinorrhea.  Eyes:     General: No scleral icterus.    Conjunctiva/sclera: Conjunctivae normal.     Right eye: Right conjunctiva is not injected.     Left eye: Left conjunctiva is not injected.     Pupils: Pupils are equal, round, and reactive to light.  Neck:     Vascular: No JVD.     Comments: She has some limited range of motion turning her head to the right not clear-cut tenderness on any of the shoulder joint muscles or sternoclavicular joint Cardiovascular:     Rate and Rhythm: Normal rate and regular rhythm.     Heart sounds: S1 normal and S2 normal.    No friction rub.  Pulmonary:     Effort: Pulmonary effort is normal. No respiratory distress.     Breath sounds: No wheezing.  Abdominal:     General: There is no distension.  Musculoskeletal:     Right shoulder: Normal.     Left shoulder: Normal.     Cervical back: Decreased range of motion.     Right hip: No deformity or tenderness. Normal range of  motion.     Left hip:  No deformity or tenderness. Decreased range of motion.     Right knee: Normal.     Left knee: Normal.  Lymphadenopathy:     Head:     Right side of head: No submandibular, preauricular or posterior auricular adenopathy.     Left side of head: No submandibular, preauricular or posterior auricular adenopathy.     Cervical: No cervical adenopathy.     Right cervical: No superficial or deep cervical adenopathy.    Left cervical: No superficial or deep cervical adenopathy.  Skin:    General: Skin is warm and dry.     Coloration: Skin is not pale.     Findings: No abrasion, bruising, ecchymosis, erythema, lesion or rash.     Nails: There is no clubbing.  Neurological:     General: No focal deficit present.     Mental Status: She is alert and oriented to person, place, and time.     Sensory: No sensory deficit.     Coordination: Coordination normal.     Gait: Gait normal.  Psychiatric:        Attention and Perception: She is attentive.        Mood and Affect: Mood normal.        Speech: Speech normal.        Behavior: Behavior normal. Behavior is cooperative.        Thought Content: Thought content normal.        Judgment: Judgment normal.        Assessment & Plan:  Neck pain that she has had since admission to the hospital when she had MRSA bacteremia complicated by infection of her SI joint phlegmon and osteomyelitis of the sacrum and ilium:  I Minna check plain films today of the neck and then if these are normal order MRI of the neck with and without contrast  MRSA bacteremia with JULUCA SI joint adjacent phlegmon and then osteomyelitis involving septic joint in the bones of the sacrum and ilium along with fasciitis and pyomyositis involving the left gluteus medius and piriformis:  Check sed rate CRP BMP and CBC differential   continue doxycycline   Vaccine counseling: she has had updated bivalent vaccine. We will give her flu shot.

## 2021-05-17 LAB — BASIC METABOLIC PANEL WITH GFR
BUN: 18 mg/dL (ref 7–25)
CO2: 31 mmol/L (ref 20–32)
Calcium: 10.5 mg/dL — ABNORMAL HIGH (ref 8.6–10.4)
Chloride: 97 mmol/L — ABNORMAL LOW (ref 98–110)
Creat: 0.69 mg/dL (ref 0.50–1.03)
Glucose, Bld: 89 mg/dL (ref 65–99)
Potassium: 4.2 mmol/L (ref 3.5–5.3)
Sodium: 137 mmol/L (ref 135–146)
eGFR: 100 mL/min/{1.73_m2} (ref >=60)

## 2021-05-17 LAB — CBC WITH DIFFERENTIAL/PLATELET
Absolute Monocytes: 760 cells/uL (ref 200–950)
Basophils Absolute: 38 cells/uL (ref 0–200)
Basophils Relative: 0.4 %
Eosinophils Absolute: 219 cells/uL (ref 15–500)
Eosinophils Relative: 2.3 %
HCT: 38.8 % (ref 35.0–45.0)
Hemoglobin: 12.8 g/dL (ref 11.7–15.5)
Lymphs Abs: 3686 cells/uL (ref 850–3900)
MCH: 30 pg (ref 27.0–33.0)
MCHC: 33 g/dL (ref 32.0–36.0)
MCV: 90.9 fL (ref 80.0–100.0)
MPV: 10 fL (ref 7.5–12.5)
Monocytes Relative: 8 %
Neutro Abs: 4798 cells/uL (ref 1500–7800)
Neutrophils Relative %: 50.5 %
Platelets: 306 10*3/uL (ref 140–400)
RBC: 4.27 10*6/uL (ref 3.80–5.10)
RDW: 12.9 % (ref 11.0–15.0)
Total Lymphocyte: 38.8 %
WBC: 9.5 10*3/uL (ref 3.8–10.8)

## 2021-05-17 LAB — SEDIMENTATION RATE: Sed Rate: 22 mm/h (ref 0–30)

## 2021-05-17 LAB — C-REACTIVE PROTEIN: CRP: 1.3 mg/L (ref <8.0)

## 2021-05-23 ENCOUNTER — Other Ambulatory Visit: Payer: Self-pay | Admitting: Infectious Disease

## 2021-05-23 ENCOUNTER — Telehealth: Payer: Self-pay

## 2021-05-23 DIAGNOSIS — M542 Cervicalgia: Secondary | ICD-10-CM

## 2021-05-23 DIAGNOSIS — R7881 Bacteremia: Secondary | ICD-10-CM

## 2021-05-23 NOTE — Telephone Encounter (Signed)
Left VM asking patient to return my call.   [place], CMA

## 2021-05-23 NOTE — Telephone Encounter (Signed)
-----   Message from Truman Hayward, MD sent at 05/23/2021  8:32 AM EDT ----- Plain films not really explaining her symptoms I will get an MRI of the cervical spine with and without contrast ----- Message ----- From: Interface, Rad Results In Sent: 05/18/2021  10:23 AM EDT To: Truman Hayward, MD

## 2021-05-24 NOTE — Telephone Encounter (Signed)
2nd unsuccessful outbound call. HIPPA compliant voicemail left to return call to [facility]. Eugenia Mcalpine

## 2021-05-24 NOTE — Telephone Encounter (Signed)
Patient returned call and made aware of MRI order placed for the following Neck pain, acute, infection suspected Dx: Neck pain [M54.2 (ICD-10-CM)]; MRSA bacteremia [R78.81, B95.62 (ICD-10-CM)]  Nicole Hines can you follow up on this.   Advised patient she will be contacted with scheduling details.   Thanks [employer]

## 2021-06-02 ENCOUNTER — Ambulatory Visit (HOSPITAL_COMMUNITY)
Admission: RE | Admit: 2021-06-02 | Discharge: 2021-06-02 | Disposition: A | Discharge status: Home / Self Care | Payer: PRIVATE HEALTH INSURANCE | Source: Ambulatory Visit | Attending: Infectious Disease | Admitting: Infectious Disease

## 2021-06-02 ENCOUNTER — Other Ambulatory Visit: Payer: Self-pay

## 2021-06-02 DIAGNOSIS — R7881 Bacteremia: Secondary | ICD-10-CM | POA: Insufficient documentation

## 2021-06-02 DIAGNOSIS — B9562 Methicillin resistant Staphylococcus aureus infection as the cause of diseases classified elsewhere: Secondary | ICD-10-CM | POA: Insufficient documentation

## 2021-06-02 DIAGNOSIS — M542 Cervicalgia: Secondary | ICD-10-CM | POA: Diagnosis not present

## 2021-06-02 MED ORDER — GADOBUTROL 1 MMOL/ML IV SOLN
10.0000 mL | Freq: Once | INTRAVENOUS | Status: AC | PRN
  Administered 2021-06-02: 10 mL via INTRAVENOUS

## 2021-06-06 ENCOUNTER — Telehealth: Payer: Self-pay

## 2021-06-06 NOTE — Telephone Encounter (Signed)
Patient called requesting MRI results from Dr. Tommy Medal.   Carlean Purl, RN

## 2021-06-07 NOTE — Telephone Encounter (Signed)
Left voicemail requesting patient return call to discuss results.   Kellianne Ek Lorita Officer, RN

## 2021-06-07 NOTE — Telephone Encounter (Signed)
Patient returned call for MRI results. Will follow up with Neurosurgery.  Leatrice Jewels, RMA

## 2021-07-04 ENCOUNTER — Ambulatory Visit (INDEPENDENT_AMBULATORY_CARE_PROVIDER_SITE_OTHER): Payer: PRIVATE HEALTH INSURANCE | Admitting: Infectious Disease

## 2021-07-04 ENCOUNTER — Encounter: Payer: Self-pay | Admitting: Infectious Disease

## 2021-07-04 ENCOUNTER — Other Ambulatory Visit: Payer: Self-pay

## 2021-07-04 VITALS — BP 133/79 | HR 71 | Temp 97.6°F | Resp 16 | Wt 268.0 lb

## 2021-07-04 DIAGNOSIS — M4628 Osteomyelitis of vertebra, sacral and sacrococcygeal region: Secondary | ICD-10-CM

## 2021-07-04 DIAGNOSIS — M00052 Staphylococcal arthritis, left hip: Secondary | ICD-10-CM | POA: Diagnosis not present

## 2021-07-04 DIAGNOSIS — L0291 Cutaneous abscess, unspecified: Secondary | ICD-10-CM

## 2021-07-04 DIAGNOSIS — M609 Myositis, unspecified: Secondary | ICD-10-CM | POA: Diagnosis not present

## 2021-07-04 DIAGNOSIS — Z22322 Carrier or suspected carrier of Methicillin resistant Staphylococcus aureus: Secondary | ICD-10-CM | POA: Diagnosis not present

## 2021-07-04 DIAGNOSIS — M542 Cervicalgia: Secondary | ICD-10-CM

## 2021-07-04 NOTE — Progress Notes (Signed)
Subjective:  Chief complaint: Continued pain in her neck particular the right side   Patient ID: Nicole Hines, female    DOB: 1960-09-14, 60 y.o.   MRN: [id number]  HPI   60 y.o. female with MRSA bacteremia and septic SI joint also with murmur,.  She had a 2D echocardiogram and a transesophageal echocardiogram which were both negative for endocarditis.  After her initial cultures on 30 April were positive for MRSA her repeat blood cultures on the third of May were not growing any organism and never did.  However would not reach day 5 of cultures being finalized.  On the day in question she had lost IV access and her pain was not capable of being controlled apparently without IV Dilaudid.  Therefore I recommended getting another set of blood cultures prior to placement of the PICC line that day.  Unfortunately that culture that was done turned out to be only a single culture and did itself again grow MRSA.  My partner Dr. Baxter Flattery and Dr. Linus Salmons was automatically consulted and saw the patient again contemplated PICC line removal.  Repeat blood cultures were done and were negative and PICC line was not removed.  My own approach would have been to remove the PICC line if necessary bridger with oral therapy prior to placing a new PICC line provided her pain could have been controlled.  In the interim she was found to have on CT scan new hypoattenuating area in the left iliac muscle concerning for a punctate foci of gas with 6 x 3.1 x 1.6 cm with concern for phlegmon near the left sacroiliac joint.  Interventional radiology attempted to aspirate this area but were unsuccessful.  She is then continued on daptomycin and was discharged to home with home IV antibiotics.  Hip pain  had continued to improve when I saw her last but still was having some pain in her groin that she attributed to the attempt to aspirate the phlegmon   I ordered an MRI of her hip to reassess the area of prior phlegmonous  changes.  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  Patient continued on doxycycline in the interim.  I last saw her inflammatory markers did come down.  At her last visit she was telling me about pain in her neck that she says she has had since she was hospitalized particular if she tries to turn her head to the right to look behind her.  She does have more limited range of motion turning her head to the right she does not have tenderness over sternoclavicular joint of the muscles themselves the shoulder joint range of motion is   I obtained an MRI of the C-spine with and without contrast.  This showed no evidence of discitis or epidural abscess in the cervical spine there was some multilevel facet arthropathy and edema and enhancement of the C3-C4 facet joint thought to be degenerative with septic arthritis being a much less likely possibility there is also some mild neural foraminal stenosis at C3 4 and C4-5, but no spinal stenosis.  She is continued on doxycycline.  She continues to have lower back pain and some hip pain but works 6 days a week at  a fairly demanding job.            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 abdomen   TEE WITHOUT CARDIOVERSION N/A 12/27/2020   Procedure: TRANSESOPHAGEAL ECHOCARDIOGRAM (TEE);  Surgeon: [name], MD;  Location: [facility] ENDOSCOPY;  Service: Cardiovascular;  Laterality: N/A;    Family History  Problem Relation Age of Onset   Cancer Mother        pancreatic   Diabetes Mother    Hypertension Mother    Heart attack Father    Heart attack Maternal Grandmother    Diabetes Brother    Cancer Maternal Aunt        uterine   Diabetes Maternal Aunt    Colon cancer Neg Hx       Social History   Socioeconomic History   Marital status: Divorced    Spouse name: Not on file   Number of children: Not on file   Years of education: Not on file   Highest education level: Not on file  Occupational History   Not on file  Tobacco Use   Smoking status: Never   Smokeless tobacco: Never  Substance and Sexual Activity   Alcohol use: No   Drug use: No   Sexual activity: Yes    Partners: Female    Birth control/protection: Surgical    Comment: btl  Other Topics Concern   Not on file  Social History Narrative   Not on file   Social Determinants of Health   Financial Resource Strain: Not on file  Food Insecurity: Not on file  Transportation Needs: Not on file  Physical Activity: Not on file  Stress: Not on file  Social Connections: Not on file    No Known Allergies   Current Outpatient Medications:    ALPRAZolam (XANAX) 0.5 MG tablet, Take 1 mg by mouth at bedtime as needed for sleep. For anxiety, Disp: , Rfl:    amLODipine (NORVASC) 5 MG tablet, Take 1 tablet (5 mg total) by mouth daily., Disp: 30 tablet, Rfl: 0   Calcium Carbonate-Vitamin D (CALCIUM 600+D3 PO), Take 1 tablet by mouth 2 (two) times daily., Disp: , Rfl:    citalopram (CELEXA) 40 MG tablet, Take 40 mg by mouth every morning., Disp: , Rfl:    clobetasol cream (TEMOVATE) 2.44  %, Apply 1 application topically at bedtime., Disp: , Rfl:    docusate sodium (COLACE) 100 MG capsule, Take 1 capsule (100 mg total) by mouth daily as needed for mild constipation or moderate constipation., Disp: 30 capsule, Rfl: 2   doxycycline (VIBRA-TABS) 100 MG tablet, Take 1 tablet (100 mg total) by mouth 2 (two) times daily., Disp: 60 tablet, Rfl: 11   fish oil-omega-3 fatty acids 1000 MG capsule, Take 1 g by mouth 2 (two) times daily., Disp: ,  Rfl:    Fluticasone-Salmeterol (ADVAIR) 250-50 MCG/DOSE AEPB, Inhale 2 puffs into the lungs 2 (two) times daily as needed (sob/wheezing)., Disp: , Rfl:    gabapentin (NEURONTIN) 100 MG capsule, Take 300 mg by mouth at bedtime., Disp: , Rfl:    Glucosamine-Chondroit-Vit C-Mn (GLUCOSAMINE 1500 COMPLEX PO), Take 1 capsule by mouth 2 (two) times daily., Disp: , Rfl:    methocarbamol (ROBAXIN) 750 MG tablet, Take 750 mg by mouth 3 (three) times daily as needed., Disp: , Rfl:    montelukast (SINGULAIR) 10 MG tablet, Take 10 mg by mouth every morning. , Disp: , Rfl:    Multiple Vitamin (MULTIVITAMIN WITH MINERALS) TABS tablet, Take 1 tablet by mouth daily., Disp: , Rfl:    omeprazole (PRILOSEC) 20 MG capsule, Take 1 capsule (20 mg total) by mouth daily before breakfast., Disp: 30 capsule, Rfl: 0   oxyCODONE-acetaminophen (PERCOCET) 10-325 MG per tablet, Take 1 tablet by mouth every 6 (six) hours as needed for pain. , Disp: , Rfl:    polyethylene glycol (MIRALAX / GLYCOLAX) 17 g packet, Take 17 g by mouth daily as needed for severe constipation., Disp: 14 each, Rfl: 2   senna-docusate (SENOKOT-S) 8.6-50 MG tablet, Take 1 tablet by mouth 2 (two) times daily., Disp: 60 tablet, Rfl: 0   telmisartan (MICARDIS) 40 MG tablet, Take 40 mg by mouth daily., Disp: , Rfl:    vitamin B-12 (CYANOCOBALAMIN) 1000 MCG tablet, Take 1,000 mcg by mouth daily., Disp: , Rfl:    methocarbamol (ROBAXIN) 500 MG tablet, Take 1 tablet (500 mg total) by mouth every 8 (eight) hours as  needed for muscle spasms. (Patient not taking: Reported on 07/04/2021), Disp: 30 tablet, Rfl: 0    Review of Systems  Constitutional:  Negative for activity change, appetite change, chills, diaphoresis, fatigue, fever and unexpected weight change.  HENT:  Negative for congestion, rhinorrhea, sinus pressure, sneezing, sore throat and trouble swallowing.   Eyes:  Negative for photophobia and visual disturbance.  Respiratory:  Negative for cough, chest tightness, shortness of breath, wheezing and stridor.   Cardiovascular:  Negative for chest pain, palpitations and leg swelling.  Gastrointestinal:  Negative for abdominal distention, abdominal pain, anal bleeding, blood in stool, constipation, diarrhea, nausea and vomiting.  Genitourinary:  Negative for difficulty urinating, dysuria, flank pain and hematuria.  Musculoskeletal:  Negative for arthralgias, back pain, gait problem, joint swelling and myalgias.  Skin:  Negative for color change, pallor, rash and wound.  Neurological:  Negative for dizziness, tremors, weakness and light-headedness.  Hematological:  Negative for adenopathy. Does not bruise/bleed easily.  Psychiatric/Behavioral:  Negative for agitation, behavioral problems, confusion, decreased concentration, dysphoric mood and sleep disturbance.       Objective:   Physical Exam Constitutional:      General: She is not in acute distress.    Appearance: Normal appearance. She is well-developed. She is not ill-appearing or diaphoretic.  HENT:     Head: Normocephalic and atraumatic.     Right Ear: Hearing and external ear normal.     Left Ear: Hearing and external ear normal.     Nose: No nasal deformity or rhinorrhea.  Eyes:     General: No scleral icterus.    Conjunctiva/sclera: Conjunctivae normal.     Right eye: Right conjunctiva is not injected.     Left eye: Left conjunctiva is not injected.     Pupils: Pupils are equal, round, and reactive to light.  Neck:     Vascular:  No JVD.  Cardiovascular:     Rate and Rhythm: Normal rate and regular rhythm.     Heart sounds: S1 normal and S2 normal.  Abdominal:     Palpations: Abdomen is soft.  Musculoskeletal:     Right shoulder: Normal.     Left shoulder: Normal.     Cervical back: Normal range of motion and neck supple.     Right hip: Normal.     Left hip: Normal.     Right knee: Normal.     Left knee: Normal.  Lymphadenopathy:     Head:     Right side of head: No submandibular, preauricular or posterior auricular adenopathy.     Left side of head: No submandibular, preauricular or posterior auricular adenopathy.     Cervical: No cervical adenopathy.     Right cervical: No superficial or deep cervical adenopathy.    Left cervical: No superficial or deep cervical adenopathy.  Skin:    General: Skin is warm and dry.     Coloration: Skin is not pale.     Findings: No abrasion, bruising, ecchymosis, erythema, lesion or rash.     Nails: There is no clubbing.  Neurological:     General: No focal deficit present.     Mental Status: She is alert and oriented to person, place, and time.     Sensory: No sensory deficit.     Coordination: Coordination normal.     Gait: Gait normal.  Psychiatric:        Attention and Perception: She is attentive.        Mood and Affect: Mood normal.        Speech: Speech normal.        Behavior: Behavior normal. Behavior is cooperative.        Thought Content: Thought content normal.        Judgment: Judgment normal.   He has some limited range of motion in her neck I suspect due to some musculoskeletal strain there.  Her hip range of motion is improve dramatically     Assessment & Plan:  MRSA bacteremia with septic SI joint adjacent phlegmon and osteomyelitis involving the bones of the sacrum and ilium along with fasciitis and pyomyositis involving the left gluteus muscle and piriformis:  Continue doxycycline and I would push for at least a year with repeat MRI prior to  considering stopping antibiotics.  Neck pain MRI does not show evidence for clear-cut infection.  She will manage this symptomatically at this point time.  I spent 41 minutes with the patient including  face to face counseling of the patient regarding the nature of her severe MRSA infection personally reviewing MRI of the cervical spine performed October 2022 along with [facility] medical records in preparation for the visit and during the visit and in coordination of her care.

## 2021-07-05 LAB — CBC WITH DIFFERENTIAL/PLATELET
Absolute Monocytes: 928 cells/uL (ref 200–950)
Basophils Absolute: 48 cells/uL (ref 0–200)
Basophils Relative: 0.6 %
Eosinophils Absolute: 248 cells/uL (ref 15–500)
Eosinophils Relative: 3.1 %
HCT: 38 % (ref 35.0–45.0)
Hemoglobin: 12.3 g/dL (ref 11.7–15.5)
Lymphs Abs: 2776 cells/uL (ref 850–3900)
MCH: 29.7 pg (ref 27.0–33.0)
MCHC: 32.4 g/dL (ref 32.0–36.0)
MCV: 91.8 fL (ref 80.0–100.0)
MPV: 9.9 fL (ref 7.5–12.5)
Monocytes Relative: 11.6 %
Neutro Abs: 4000 cells/uL (ref 1500–7800)
Neutrophils Relative %: 50 %
Platelets: 265 10*3/uL (ref 140–400)
RBC: 4.14 10*6/uL (ref 3.80–5.10)
RDW: 12.5 % (ref 11.0–15.0)
Total Lymphocyte: 34.7 %
WBC: 8 10*3/uL (ref 3.8–10.8)

## 2021-07-05 LAB — BASIC METABOLIC PANEL WITH GFR
BUN: 15 mg/dL (ref 7–25)
CO2: 34 mmol/L — ABNORMAL HIGH (ref 20–32)
Calcium: 9.9 mg/dL (ref 8.6–10.4)
Chloride: 99 mmol/L (ref 98–110)
Creat: 0.63 mg/dL (ref 0.50–1.03)
Glucose, Bld: 79 mg/dL (ref 65–99)
Potassium: 4.8 mmol/L (ref 3.5–5.3)
Sodium: 139 mmol/L (ref 135–146)
eGFR: 102 mL/min/{1.73_m2} (ref >=60)

## 2021-07-05 LAB — SEDIMENTATION RATE: Sed Rate: 33 mm/h — ABNORMAL HIGH (ref 0–30)

## 2021-07-05 LAB — C-REACTIVE PROTEIN: CRP: 2.8 mg/L (ref <8.0)

## 2021-08-25 ENCOUNTER — Other Ambulatory Visit: Payer: Self-pay

## 2021-08-25 ENCOUNTER — Emergency Department (HOSPITAL_COMMUNITY): Payer: PRIVATE HEALTH INSURANCE

## 2021-08-25 ENCOUNTER — Inpatient Hospital Stay (HOSPITAL_COMMUNITY)
Admission: EM | Admit: 2021-08-25 | Discharge: 2021-08-28 | DRG: 306 | Disposition: A | Discharge status: Home / Self Care | Payer: PRIVATE HEALTH INSURANCE | Attending: Internal Medicine | Admitting: Internal Medicine

## 2021-08-25 DIAGNOSIS — T402X1A Poisoning by other opioids, accidental (unintentional), initial encounter: Secondary | ICD-10-CM

## 2021-08-25 DIAGNOSIS — Z22322 Carrier or suspected carrier of Methicillin resistant Staphylococcus aureus: Secondary | ICD-10-CM

## 2021-08-25 DIAGNOSIS — Z792 Long term (current) use of antibiotics: Secondary | ICD-10-CM

## 2021-08-25 DIAGNOSIS — Z8049 Family history of malignant neoplasm of other genital organs: Secondary | ICD-10-CM

## 2021-08-25 DIAGNOSIS — J45901 Unspecified asthma with (acute) exacerbation: Secondary | ICD-10-CM | POA: Diagnosis present

## 2021-08-25 DIAGNOSIS — A419 Sepsis, unspecified organism: Secondary | ICD-10-CM | POA: Diagnosis present

## 2021-08-25 DIAGNOSIS — T40601A Poisoning by unspecified narcotics, accidental (unintentional), initial encounter: Secondary | ICD-10-CM

## 2021-08-25 DIAGNOSIS — I4891 Unspecified atrial fibrillation: Secondary | ICD-10-CM

## 2021-08-25 DIAGNOSIS — D72829 Elevated white blood cell count, unspecified: Secondary | ICD-10-CM

## 2021-08-25 DIAGNOSIS — R7989 Other specified abnormal findings of blood chemistry: Secondary | ICD-10-CM

## 2021-08-25 DIAGNOSIS — I350 Nonrheumatic aortic (valve) stenosis: Secondary | ICD-10-CM | POA: Diagnosis not present

## 2021-08-25 DIAGNOSIS — R778 Other specified abnormalities of plasma proteins: Secondary | ICD-10-CM

## 2021-08-25 DIAGNOSIS — G8929 Other chronic pain: Secondary | ICD-10-CM | POA: Diagnosis present

## 2021-08-25 DIAGNOSIS — B9562 Methicillin resistant Staphylococcus aureus infection as the cause of diseases classified elsewhere: Secondary | ICD-10-CM | POA: Diagnosis present

## 2021-08-25 DIAGNOSIS — R55 Syncope and collapse: Secondary | ICD-10-CM | POA: Diagnosis not present

## 2021-08-25 DIAGNOSIS — J9601 Acute respiratory failure with hypoxia: Secondary | ICD-10-CM

## 2021-08-25 DIAGNOSIS — Z7982 Long term (current) use of aspirin: Secondary | ICD-10-CM

## 2021-08-25 DIAGNOSIS — E876 Hypokalemia: Secondary | ICD-10-CM | POA: Diagnosis present

## 2021-08-25 DIAGNOSIS — Z8614 Personal history of Methicillin resistant Staphylococcus aureus infection: Secondary | ICD-10-CM

## 2021-08-25 DIAGNOSIS — G9340 Encephalopathy, unspecified: Secondary | ICD-10-CM

## 2021-08-25 DIAGNOSIS — G9341 Metabolic encephalopathy: Secondary | ICD-10-CM | POA: Diagnosis not present

## 2021-08-25 DIAGNOSIS — Z800 Family history of malignant neoplasm of digestive organs: Secondary | ICD-10-CM

## 2021-08-25 DIAGNOSIS — E860 Dehydration: Secondary | ICD-10-CM | POA: Diagnosis present

## 2021-08-25 DIAGNOSIS — R6520 Severe sepsis without septic shock: Secondary | ICD-10-CM

## 2021-08-25 DIAGNOSIS — E872 Acidosis, unspecified: Secondary | ICD-10-CM | POA: Diagnosis present

## 2021-08-25 DIAGNOSIS — K7469 Other cirrhosis of liver: Secondary | ICD-10-CM | POA: Diagnosis present

## 2021-08-25 DIAGNOSIS — I468 Cardiac arrest due to other underlying condition: Secondary | ICD-10-CM | POA: Diagnosis present

## 2021-08-25 DIAGNOSIS — Z6841 Body Mass Index (BMI) 40.0 and over, adult: Secondary | ICD-10-CM

## 2021-08-25 DIAGNOSIS — Z20822 Contact with and (suspected) exposure to covid-19: Secondary | ICD-10-CM | POA: Diagnosis present

## 2021-08-25 DIAGNOSIS — M009 Pyogenic arthritis, unspecified: Secondary | ICD-10-CM | POA: Diagnosis present

## 2021-08-25 DIAGNOSIS — K7581 Nonalcoholic steatohepatitis (NASH): Secondary | ICD-10-CM | POA: Diagnosis present

## 2021-08-25 DIAGNOSIS — I10 Essential (primary) hypertension: Secondary | ICD-10-CM | POA: Diagnosis present

## 2021-08-25 DIAGNOSIS — M0008 Staphylococcal arthritis, vertebrae: Secondary | ICD-10-CM | POA: Diagnosis present

## 2021-08-25 DIAGNOSIS — Z8249 Family history of ischemic heart disease and other diseases of the circulatory system: Secondary | ICD-10-CM

## 2021-08-25 DIAGNOSIS — Z833 Family history of diabetes mellitus: Secondary | ICD-10-CM

## 2021-08-25 DIAGNOSIS — F1120 Opioid dependence, uncomplicated: Secondary | ICD-10-CM | POA: Diagnosis present

## 2021-08-25 DIAGNOSIS — R9431 Abnormal electrocardiogram [ECG] [EKG]: Secondary | ICD-10-CM | POA: Diagnosis present

## 2021-08-25 DIAGNOSIS — F419 Anxiety disorder, unspecified: Secondary | ICD-10-CM | POA: Diagnosis present

## 2021-08-25 DIAGNOSIS — T402X5A Adverse effect of other opioids, initial encounter: Secondary | ICD-10-CM | POA: Diagnosis present

## 2021-08-25 DIAGNOSIS — I248 Other forms of acute ischemic heart disease: Secondary | ICD-10-CM | POA: Diagnosis present

## 2021-08-25 DIAGNOSIS — I469 Cardiac arrest, cause unspecified: Secondary | ICD-10-CM

## 2021-08-25 LAB — DIFFERENTIAL
Abs Immature Granulocytes: 0.1 10*3/uL — ABNORMAL HIGH (ref 0.00–0.07)
Basophils Absolute: 0 10*3/uL (ref 0.0–0.1)
Basophils Relative: 0 %
Eosinophils Absolute: 0 10*3/uL (ref 0.0–0.5)
Eosinophils Relative: 0 %
Immature Granulocytes: 1 %
Lymphocytes Relative: 5 %
Lymphs Abs: 0.7 10*3/uL (ref 0.7–4.0)
Monocytes Absolute: 1.3 10*3/uL — ABNORMAL HIGH (ref 0.1–1.0)
Monocytes Relative: 9 %
Neutro Abs: 12.8 10*3/uL — ABNORMAL HIGH (ref 1.7–7.7)
Neutrophils Relative %: 85 %

## 2021-08-25 LAB — HEPATIC FUNCTION PANEL
ALT: 63 U/L — ABNORMAL HIGH (ref 0–44)
AST: 72 U/L — ABNORMAL HIGH (ref 15–41)
Albumin: 3.5 g/dL (ref 3.5–5.0)
Alkaline Phosphatase: 101 U/L (ref 38–126)
Bilirubin, Direct: <0.1 mg/dL (ref 0.0–0.2)
Total Bilirubin: 0.4 mg/dL (ref 0.3–1.2)
Total Protein: 7.1 g/dL (ref 6.5–8.1)

## 2021-08-25 LAB — LACTIC ACID, PLASMA
Lactic Acid, Venous: 4 mmol/L (ref 0.5–1.9)
Lactic Acid, Venous: 1.2 mmol/L (ref 0.5–1.9)

## 2021-08-25 LAB — I-STAT CHEM 8, ED
BUN: 18 mg/dL (ref 6–20)
Calcium, Ion: 1.33 mmol/L (ref 1.15–1.40)
Chloride: 103 mmol/L (ref 98–111)
Creatinine, Ser: 0.6 mg/dL (ref 0.44–1.00)
Glucose, Bld: 135 mg/dL — ABNORMAL HIGH (ref 70–99)
HCT: 40 % (ref 36.0–46.0)
Hemoglobin: 13.6 g/dL (ref 12.0–15.0)
Potassium: 2.9 mmol/L — ABNORMAL LOW (ref 3.5–5.1)
Sodium: 142 mmol/L (ref 135–145)
TCO2: 27 mmol/L (ref 22–32)

## 2021-08-25 LAB — PHOSPHORUS: Phosphorus: 3.1 mg/dL (ref 2.5–4.6)

## 2021-08-25 LAB — BASIC METABOLIC PANEL
Anion gap: 8 (ref 5–15)
BUN: 15 mg/dL (ref 6–20)
CO2: 27 mmol/L (ref 22–32)
Calcium: 9.8 mg/dL (ref 8.9–10.3)
Chloride: 103 mmol/L (ref 98–111)
Creatinine, Ser: 0.77 mg/dL (ref 0.44–1.00)
GFR, Estimated: >60 mL/min (ref >60)
Glucose, Bld: 134 mg/dL — ABNORMAL HIGH (ref 70–99)
Potassium: 2.9 mmol/L — ABNORMAL LOW (ref 3.5–5.1)
Sodium: 138 mmol/L (ref 135–145)

## 2021-08-25 LAB — CBC
HCT: 40.3 % (ref 36.0–46.0)
HCT: 37.2 % (ref 36.0–46.0)
Hemoglobin: 12.9 g/dL (ref 12.0–15.0)
Hemoglobin: 11.8 g/dL — ABNORMAL LOW (ref 12.0–15.0)
MCH: 30.6 pg (ref 26.0–34.0)
MCH: 30 pg (ref 26.0–34.0)
MCHC: 32 g/dL (ref 30.0–36.0)
MCHC: 31.7 g/dL (ref 30.0–36.0)
MCV: 95.5 fL (ref 80.0–100.0)
MCV: 94.7 fL (ref 80.0–100.0)
Platelets: 259 10*3/uL (ref 150–400)
Platelets: 232 10*3/uL (ref 150–400)
RBC: 4.22 MIL/uL (ref 3.87–5.11)
RBC: 3.93 MIL/uL (ref 3.87–5.11)
RDW: 13 % (ref 11.5–15.5)
RDW: 13.1 % (ref 11.5–15.5)
WBC: 20.1 10*3/uL — ABNORMAL HIGH (ref 4.0–10.5)
WBC: 15 10*3/uL — ABNORMAL HIGH (ref 4.0–10.5)
nRBC: 0 % (ref 0.0–0.2)
nRBC: 0 % (ref 0.0–0.2)

## 2021-08-25 LAB — TROPONIN I (HIGH SENSITIVITY)
Troponin I (High Sensitivity): 112 ng/L (ref <18)
Troponin I (High Sensitivity): 359 ng/L (ref <18)
Troponin I (High Sensitivity): 371 ng/L (ref <18)

## 2021-08-25 LAB — RESP PANEL BY RT-PCR (FLU A&B, COVID) ARPGX2
Influenza A by PCR: NEGATIVE
Influenza B by PCR: NEGATIVE
SARS Coronavirus 2 by RT PCR: NEGATIVE

## 2021-08-25 LAB — CK: Total CK: 175 U/L (ref 38–234)

## 2021-08-25 LAB — PROCALCITONIN: Procalcitonin: 0.11 ng/mL

## 2021-08-25 LAB — CBG MONITORING, ED: Glucose-Capillary: 129 mg/dL — ABNORMAL HIGH (ref 70–99)

## 2021-08-25 LAB — MAGNESIUM
Magnesium: 2.5 mg/dL — ABNORMAL HIGH (ref 1.7–2.4)
Magnesium: 2.2 mg/dL (ref 1.7–2.4)

## 2021-08-25 LAB — D-DIMER, QUANTITATIVE: D-Dimer, Quant: 5.82 ug/mL-FEU — ABNORMAL HIGH (ref 0.00–0.50)

## 2021-08-25 MED ORDER — ALBUTEROL SULFATE (2.5 MG/3ML) 0.083% IN NEBU: 2.5000 mg | INHALATION_SOLUTION | RESPIRATORY_TRACT | Status: DC | PRN

## 2021-08-25 MED ORDER — SODIUM CHLORIDE 0.9 % IV BOLUS (SEPSIS)
500.0000 mL | Freq: Once | INTRAVENOUS | Status: AC
  Administered 2021-08-25: 500 mL via INTRAVENOUS

## 2021-08-25 MED ORDER — ACETAMINOPHEN 325 MG PO TABS: 650.0000 mg | ORAL_TABLET | Freq: Four times a day (QID) | ORAL | Status: DC | PRN

## 2021-08-25 MED ORDER — POLYETHYLENE GLYCOL 3350 17 G PO PACK
17.0000 g | PACK | Freq: Two times a day (BID) | ORAL | Status: DC
  Administered 2021-08-25 – 2021-08-27 (×4): 17 g via ORAL
  Filled 2021-08-25 (×5): qty 1

## 2021-08-25 MED ORDER — POTASSIUM CHLORIDE 10 MEQ/100ML IV SOLN
10.0000 meq | INTRAVENOUS | Status: AC
  Administered 2021-08-25: 10 meq via INTRAVENOUS
  Filled 2021-08-25 (×2): qty 100

## 2021-08-25 MED ORDER — IOHEXOL 350 MG/ML SOLN
100.0000 mL | Freq: Once | INTRAVENOUS | Status: AC | PRN
  Administered 2021-08-25: 100 mL via INTRAVENOUS

## 2021-08-25 MED ORDER — SODIUM CHLORIDE 0.9 % IV SOLN
75.0000 mL/h | INTRAVENOUS | Status: AC
  Administered 2021-08-25: 75 mL/h via INTRAVENOUS

## 2021-08-25 MED ORDER — SODIUM CHLORIDE 0.9 % IV SOLN
1.0000 g | Freq: Once | INTRAVENOUS | Status: AC
  Administered 2021-08-25: 1 g via INTRAVENOUS
  Filled 2021-08-25: qty 10

## 2021-08-25 MED ORDER — SODIUM CHLORIDE 0.9 % IV BOLUS
1000.0000 mL | Freq: Once | INTRAVENOUS | Status: AC
  Administered 2021-08-25: 1000 mL via INTRAVENOUS

## 2021-08-25 MED ORDER — POTASSIUM CHLORIDE 10 MEQ/100ML IV SOLN
10.0000 meq | INTRAVENOUS | Status: AC
  Administered 2021-08-25 – 2021-08-26 (×4): 10 meq via INTRAVENOUS
  Filled 2021-08-25 (×3): qty 100

## 2021-08-25 MED ORDER — HYDROCODONE-ACETAMINOPHEN 5-325 MG PO TABS
1.0000 | ORAL_TABLET | ORAL | Status: DC | PRN
  Administered 2021-08-25: 2 via ORAL
  Administered 2021-08-26: 1 via ORAL
  Filled 2021-08-25: qty 1
  Filled 2021-08-25: qty 2

## 2021-08-25 MED ORDER — ACETAMINOPHEN 650 MG RE SUPP: 650.0000 mg | Freq: Four times a day (QID) | RECTAL | Status: DC | PRN

## 2021-08-25 MED ORDER — ONDANSETRON HCL 4 MG/2ML IJ SOLN
4.0000 mg | Freq: Once | INTRAMUSCULAR | Status: AC
  Administered 2021-08-25: 4 mg via INTRAVENOUS
  Filled 2021-08-25: qty 2

## 2021-08-25 MED ORDER — SODIUM CHLORIDE 0.9 % IV SOLN
1000.0000 mL | INTRAVENOUS | Status: DC
  Administered 2021-08-25: 1000 mL via INTRAVENOUS

## 2021-08-25 NOTE — Assessment & Plan Note (Signed)
Body mass index is 44.61 kg/m. Follow up as an outpatient with nutrition

## 2021-08-25 NOTE — ED Notes (Signed)
Admit provider at bedside at this time

## 2021-08-25 NOTE — ED Notes (Signed)
Transport here to take pt to radiology at this time

## 2021-08-25 NOTE — ED Notes (Signed)
Pt requesting something to eat and drink. Admit provider sent a message reference same

## 2021-08-25 NOTE — Subjective & Objective (Addendum)
Found unresponsive, EMS arrived began CPR after 1 round of epi and 8 min of CPR ROCS Notd in A.fib RVR pt given a dose of NArcan and became combative  Recent ABx for MRSA infection followed by Dr. Drucilla Schmidt Has been on percocet and xanax   Ever since the patient has been breathing on her own.  She does not remember what happened today.  She denies taking any excess medications.  She is having some pain in her chest and abdomen now after CPR

## 2021-08-25 NOTE — Assessment & Plan Note (Signed)
Cont prn albuterol

## 2021-08-25 NOTE — ED Provider Notes (Signed)
[facility] EMERGENCY DEPARTMENT Provider Note   CSN: [id number] Arrival date & time: 08/25/21  1809     History  Chief Complaint  Patient presents with   Respiratory Distress    Nicole Hines is a 61 y.o. female.  HPI  Patient presents for evaluation after cardiac arrest.  Patient has history of multiple medical problems including bacterial endocarditis, sepsis, septic joint, hypertension on chronic pain medications.  Patient was found by family unresponsive.  EMS was called.  They initiated CPR.  Patient was given a dose of epinephrine and they had return of spontaneous circulation.  Patient did have a [place] airway placed family members mention that the patient does chronically take opiates and EMS administered Narcan.  Patient's mental status completely improved.  She became more awake and alert.   EMS was actually able to remove the [facility] airway.  Ever since the patient has been breathing on her own.  She does not remember what happened today.  She denies taking any excess medications.  She is having some pain in her chest and abdomen now.  She denies any headache.  No focal numbness or weakness.  No known fevers or chills.   Additional history was provided by EMS.  The patient CPR start time was at 1701.  ROSC was obtained at 1707.  Patient was given epi x4 and post ROSC she was given Narcan.  Patient's initial rhythm was bradycardic PEA narrow complex.  Initial ROSC rhythm was A. fib RVR that converted to sinus tachycardia Home Medications Prior to Admission medications   Medication Sig Start Date End Date Taking? Authorizing Provider  albuterol (VENTOLIN HFA) 108 (90 Base) MCG/ACT inhaler Inhale 2 puffs into the lungs every 4 (four) hours as needed for shortness of breath or wheezing.   Yes [provider]  ALPRAZolam Duanne Moron) 0.5 MG tablet Take 1 mg by mouth at bedtime. For anxiety   Yes [provider]  Calcium Carbonate-Vitamin D (CALCIUM  600+D3 PO) Take 1 tablet by mouth 2 (two) times daily.   Yes [provider]  citalopram (CELEXA) 40 MG tablet Take 40 mg by mouth every morning.   Yes [provider]  clobetasol cream (TEMOVATE) 3.41 % Apply 1 application topically at bedtime. Around lips 10/18/20  Yes [provider]  doxycycline (VIBRA-TABS) 100 MG tablet Take 1 tablet (100 mg total) by mouth 2 (two) times daily. Patient taking differently: Take 100 mg by mouth 2 (two) times daily. Continuously 03/04/21  Yes Tommy Medal, Lavell Islam, MD  fish oil-omega-3 fatty acids 1000 MG capsule Take 1 g by mouth 2 (two) times daily.   Yes [provider]  gabapentin (NEURONTIN) 100 MG capsule Take 300 mg by mouth at bedtime. 11/11/20  Yes [provider]  Glucosamine-Chondroit-Vit C-Mn (GLUCOSAMINE 1500 COMPLEX PO) Take 1 capsule by mouth 2 (two) times daily.   Yes [provider]  methocarbamol (ROBAXIN) 750 MG tablet Take 750 mg by mouth 3 (three) times daily as needed for muscle spasms. 06/10/21  Yes [provider]  montelukast (SINGULAIR) 10 MG tablet Take 10 mg by mouth every morning.  10/19/13  Yes [provider]  Multiple Vitamin (MULTIVITAMIN WITH MINERALS) TABS tablet Take 1 tablet by mouth daily.   Yes [provider]  omeprazole (PRILOSEC) 20 MG capsule Take 1 capsule (20 mg total) by mouth daily before breakfast. 12/30/20  Yes Amin, Ankit Chirag, MD  oxyCODONE-acetaminophen (PERCOCET) 10-325 MG per tablet Take 1 tablet  by mouth every 6 (six) hours as needed for pain.  10/25/13  Yes [provider]  polyethylene glycol (MIRALAX / GLYCOLAX) 17 g packet Take 17 g by mouth daily as needed for severe constipation. 12/30/20  Yes Amin, Ankit Chirag, MD  telmisartan (MICARDIS) 40 MG tablet Take 40 mg by mouth daily. 01/31/21  Yes [provider]  vitamin B-12 (CYANOCOBALAMIN) 1000 MCG tablet Take 1,000 mcg by mouth daily.   Yes [provider]   amLODipine (NORVASC) 5 MG tablet Take 1 tablet (5 mg total) by mouth daily. Patient not taking: Reported on 08/25/2021 12/30/20   Damita Lack, MD  docusate sodium (COLACE) 100 MG capsule Take 1 capsule (100 mg total) by mouth daily as needed for mild constipation or moderate constipation. Patient not taking: Reported on 08/25/2021 12/30/20 12/30/21  Damita Lack, MD  Fluticasone-Salmeterol (ADVAIR) 250-50 MCG/DOSE AEPB Inhale 2 puffs into the lungs 2 (two) times daily as needed (sob/wheezing). Patient not taking: Reported on 08/25/2021    [provider]  methocarbamol (ROBAXIN) 500 MG tablet Take 1 tablet (500 mg total) by mouth every 8 (eight) hours as needed for muscle spasms. Patient not taking: Reported on 07/04/2021 12/30/20   Damita Lack, MD  senna-docusate (SENOKOT-S) 8.6-50 MG tablet Take 1 tablet by mouth 2 (two) times daily. Patient not taking: Reported on 08/25/2021 12/30/20   Damita Lack, MD      Allergies    Patient has no known allergies.    Review of Systems   Review of Systems  Physical Exam Updated Vital Signs BP (!) 98/57    Pulse 86    Temp (!) 97.5 F (36.4 C) (Oral)    Resp 18    Ht 1.651 m (5' 5" )    Wt 121.6 kg    LMP 02/18/2014    SpO2 96%    BMI 44.61 kg/m  Physical Exam Vitals and nursing note reviewed.  Constitutional:      Appearance: She is well-developed. She is not diaphoretic.  HENT:     Head: Normocephalic and atraumatic.     Right Ear: External ear normal.     Left Ear: External ear normal.  Eyes:     General: No scleral icterus.       Right eye: No discharge.        Left eye: No discharge.     Conjunctiva/sclera: Conjunctivae normal.  Neck:     Trachea: No tracheal deviation.  Cardiovascular:     Rate and Rhythm: Normal rate and regular rhythm.  Pulmonary:     Effort: Pulmonary effort is normal. No respiratory distress.     Breath sounds: Normal breath sounds. No stridor. No wheezing or rales.  Chest:      Comments: Mild chest wall tenderness Abdominal:     General: Bowel sounds are normal. There is no distension.     Palpations: Abdomen is soft.     Tenderness: There is abdominal tenderness. There is no guarding or rebound.     Comments: Protuberant abdomen  Musculoskeletal:        General: No tenderness or deformity.     Cervical back: Neck supple.  Skin:    General: Skin is warm and dry.     Findings: No rash.  Neurological:     General: No focal deficit present.     Mental Status: She is alert.     Cranial Nerves: No cranial nerve deficit (no facial droop, extraocular movements intact,  no slurred speech).     Sensory: No sensory deficit.     Motor: No abnormal muscle tone or seizure activity.     Coordination: Coordination normal.     Comments: Patient answers questions appropriately follows commands, able to lift both arms and the legs off the bed without difficulty  Psychiatric:        Mood and Affect: Mood normal.    ED Results / Procedures / Treatments   Labs (all labs ordered are listed, but only abnormal results are displayed) Labs Reviewed  BASIC METABOLIC PANEL - Abnormal; Notable for the following components:      Result Value   Potassium 2.9 (*)    Glucose, Bld 134 (*)    All other components within normal limits  CBC - Abnormal; Notable for the following components:   WBC 20.1 (*)    All other components within normal limits  LACTIC ACID, PLASMA - Abnormal; Notable for the following components:   Lactic Acid, Venous 4.0 (*)    All other components within normal limits  MAGNESIUM - Abnormal; Notable for the following components:   Magnesium 2.5 (*)    All other components within normal limits  DIFFERENTIAL - Abnormal; Notable for the following components:   Neutro Abs 12.8 (*)    Monocytes Absolute 1.3 (*)    Abs Immature Granulocytes 0.10 (*)    All other components within normal limits  HEPATIC FUNCTION PANEL - Abnormal; Notable for the following  components:   AST 72 (*)    ALT 63 (*)    All other components within normal limits  D-DIMER, QUANTITATIVE - Abnormal; Notable for the following components:   D-Dimer, Quant 5.82 (*)    All other components within normal limits  CBC - Abnormal; Notable for the following components:   WBC 15.0 (*)    Hemoglobin 11.8 (*)    All other components within normal limits  CBG MONITORING, ED - Abnormal; Notable for the following components:   Glucose-Capillary 129 (*)    All other components within normal limits  I-STAT CHEM 8, ED - Abnormal; Notable for the following components:   Potassium 2.9 (*)    Glucose, Bld 135 (*)    All other components within normal limits  TROPONIN I (HIGH SENSITIVITY) - Abnormal; Notable for the following components:   Troponin I (High Sensitivity) 112 (*)    All other components within normal limits  TROPONIN I (HIGH SENSITIVITY) - Abnormal; Notable for the following components:   Troponin I (High Sensitivity) 371 (*)    All other components within normal limits  TROPONIN I (HIGH SENSITIVITY) - Abnormal; Notable for the following components:   Troponin I (High Sensitivity) 359 (*)    All other components within normal limits  RESP PANEL BY RT-PCR (FLU A&B, COVID) ARPGX2  CULTURE, BLOOD (ROUTINE X 2)  CULTURE, BLOOD (ROUTINE X 2)  LACTIC ACID, PLASMA  MAGNESIUM  CK  PHOSPHORUS  URINALYSIS, ROUTINE W REFLEX MICROSCOPIC  TSH  URINALYSIS, COMPLETE (UACMP) WITH MICROSCOPIC  LACTIC ACID, PLASMA  LACTIC ACID, PLASMA  BRAIN NATRIURETIC PEPTIDE  PROCALCITONIN  PROCALCITONIN  BASIC METABOLIC PANEL  MAGNESIUM  PHOSPHORUS  CBC WITH DIFFERENTIAL/PLATELET  TSH  COMPREHENSIVE METABOLIC PANEL  TROPONIN I (HIGH SENSITIVITY)    EKG EKG Interpretation  Date/Time:  Thursday August 25 2021 18:23:48 EST Ventricular Rate:  99 PR Interval:  184 QRS Duration: 145 QT Interval:  396 QTC Calculation: 509 R Axis:   -13 Text Interpretation: Sinus  rhythm LVH with  IVCD and secondary repol abnrm Borderline prolonged QT interval Confirmed by Dorie Rank [phone]) on 08/25/2021 8:27:59 PM  Radiology CT Head Wo Contrast  Result Date: 08/25/2021 CLINICAL DATA:  Mental status change, unknown cause EXAM: CT HEAD WITHOUT CONTRAST TECHNIQUE: Contiguous axial images were obtained from the base of the skull through the vertex without intravenous contrast. COMPARISON:  03/29/2006. FINDINGS: Brain: No evidence of acute infarction, hemorrhage, cerebral edema, mass, mass effect, or midline shift. No hydrocephalus or extra-axial fluid collection. Vascular: No hyperdense vessel. Skull: Normal. Negative for fracture or focal lesion. Sinuses/Orbits: Mucous retention cyst in the left frontal sinus. Mild mucosal thickening in the ethmoid air cells. The orbits are unremarkable. Other: The mastoid air cells are well aerated. IMPRESSION: IMPRESSION No acute intracranial process. Electronically Signed   By: Merilyn Baba M.D.   On: 08/25/2021 20:00   DG Chest Portable 1 View  Result Date: 08/25/2021 CLINICAL DATA:  CPR. EXAM: PORTABLE CHEST 1 VIEW COMPARISON:  Chest radiograph 01/17/2021 FINDINGS: Chronic cardiomegaly. Unchanged mediastinal contours allowing for differences in technique. No pneumothorax or pulmonary edema. Bibasilar atelectasis, left greater than right. No pleural effusion. On limited evaluation, no acute osseous abnormalities are seen. IMPRESSION: Chronic cardiomegaly. Bibasilar atelectasis. Electronically Signed   By: Keith Rake M.D.   On: 08/25/2021 18:47   CT Angio Chest/Abd/Pel for Dissection W and/or Wo Contrast  Result Date: 08/25/2021 CLINICAL DATA:  Aortic aneurysm, known or suspected Post CPR. EXAM: CT ANGIOGRAPHY CHEST, ABDOMEN AND PELVIS TECHNIQUE: Non-contrast CT of the chest was initially obtained. Multidetector CT imaging through the chest, abdomen and pelvis was performed using the standard protocol during bolus administration of intravenous contrast.  Multiplanar reconstructed images and MIPs were obtained and reviewed to evaluate the vascular anatomy. CONTRAST:  118m OMNIPAQUE IOHEXOL 350 MG/ML SOLN COMPARISON:  Chest radiograph earlier today. Abdominopelvic CT 12/18/2020. No prior chest CT available. FINDINGS: CTA CHEST FINDINGS Cardiovascular: No aortic hematoma noncontrast exam. The thoracic aorta is tortuous but normal in caliber. No aortic aneurysm or dissection. Conventional branching pattern from the aortic arch exam not tailored for evaluation of pulmonary arteries. No obvious central pulmonary embolus. The heart is upper normal in size. Trace pericardial effusion. Subcentimeter lobulated low-density adjacent to the descending aorta in the lower chest was present on prior exam, may represent lymphatic malformation or dilatation of the as azygous/semi azygous system. Mediastinum/Nodes: No enlarged mediastinal, hilar, or axillary lymph nodes. Patulous distal esophagus with small hiatal hernia. No thyroid nodule. Lungs/Pleura: No pneumothorax. Minimal retained mucus within the trachea and central bronchi. Minor subsegmental atelectasis in the right greater than left lower lobe. No pleural effusion or findings of pulmonary edema. Musculoskeletal: No definite rib fracture, mild motion artifact limits detailed rib assessment. No sternal fracture. Diffuse thoracic spondylosis with multilevel endplate spurring. No confluent chest wall contusion. Review of the MIP images confirms the above findings. CTA ABDOMEN AND PELVIS FINDINGS VASCULAR Aorta: Normal caliber abdominal aorta. No aneurysm. No dissection or acute findings. Celiac: Patent without evidence of aneurysm, dissection, vasculitis or significant stenosis. SMA: Patent without evidence of aneurysm, dissection, vasculitis or significant stenosis. Renals: Both renal arteries are patent without evidence of aneurysm, dissection, vasculitis, fibromuscular dysplasia or significant stenosis. IMA: Patent. Inflow:  Patent without evidence of aneurysm, dissection, vasculitis or significant stenosis. Veins: No obvious venous abnormality within the limitations of this arterial phase study. Review of the MIP images confirms the above findings. NON-VASCULAR Hepatobiliary: Suggestion of decreased hepatic density and micro nodular hepatic contours.  No focal hepatic lesion on this arterial phase exam. Cholecystectomy with surgical clips in the gallbladder fossa. Probable dropped clips adjacent to the inferior liver edge. Common bile duct is not well-defined, no evidence of biliary dilatation. Pancreas: Fatty atrophy.  No ductal dilatation or inflammation. Spleen: Normal in size and arterial enhancement. Adrenals/Urinary Tract: No adrenal nodule. Question of heterogeneous bilateral renal enhancement versus artifact from arms down positioning and habitus. No hydronephrosis or focal renal abnormality. Distended urinary bladder without wall thickening. No visualized urolithiasis. Stomach/Bowel: Small hiatal hernia. No bowel obstruction or inflammation. Moderate stool burden with moderate stool distending the rectum. Normal appendix. Lymphatic: Scattered small retroperitoneal lymph nodes are not enlarged by size criteria. No enlarged lymph nodes in the abdomen or pelvis. Reproductive: Quiescent uterus, deviating into the right pelvis. No adnexal mass. Other: No free air or ascites.  No abdominal wall hernia. Musculoskeletal: Multilevel degenerative change in the spine. 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 Review of the MIP images confirms the above findings. IMPRESSION: 1. No aortic dissection or acute aortic abnormality. No aneurysm of the thoracoabdominal aorta. 2. Question of heterogeneous bilateral renal enhancement versus artifact from arms down positioning and habitus. Recommend correlation with urinalysis. 3. Patulous distal esophagus with  small hiatal hernia. 4. Suggestion of decreased hepatic density and micro nodular hepatic contours, suggesting cirrhosis. Recommend correlation with cirrhosis risk factors 5. Moderate stool burden with moderate stool distending the rectum, suggesting constipation. Aortic Atherosclerosis (ICD10-I70.0). Electronically Signed   By: Keith Rake M.D.   On: 08/25/2021 20:13    Procedures .Critical Care Performed by: Dorie Rank, MD Authorized by: Dorie Rank, MD   Critical care provider statement:    Critical care time (minutes):  30   Critical care was time spent personally by me on the following activities:  Development of treatment plan with patient or surrogate, discussions with consultants, evaluation of patient's response to treatment, examination of patient, ordering and review of laboratory studies, ordering and review of radiographic studies, ordering and performing treatments and interventions, pulse oximetry, re-evaluation of patient's condition and review of old charts .1-3 Lead EKG Interpretation Performed by: Dorie Rank, MD Authorized by: Dorie Rank, MD     Interpretation: normal     ECG rate:  93   ECG rate assessment: normal     Rhythm: sinus rhythm     Ectopy: none     Conduction: normal   Comments:     8:27 PM     Medications Ordered in ED Medications  sodium chloride 0.9 % bolus 500 mL (0 mLs Intravenous Stopped 08/25/21 2028)    Followed by  0.9 %  sodium chloride infusion (0 mLs Intravenous Stopped 08/25/21 2255)  potassium chloride 10 mEq in 100 mL IVPB (0 mEq Intravenous Stopped 08/25/21 2139)  0.9 %  sodium chloride infusion (75 mL/hr Intravenous New Bag/Given 08/25/21 2146)  acetaminophen (TYLENOL) tablet 650 mg (has no administration in time range)    Or  acetaminophen (TYLENOL) suppository 650 mg (has no administration in time range)  HYDROcodone-acetaminophen (NORCO/VICODIN) 5-325 MG per tablet 1-2 tablet (2 tablets Oral Given 08/25/21 2159)  albuterol (PROVENTIL)  (2.5 MG/3ML) 0.083% nebulizer solution 2.5 mg (has no administration in time range)  potassium chloride 10 mEq in 100 mL IVPB (10 mEq Intravenous New Bag/Given 08/25/21 2256)  ondansetron (ZOFRAN) injection 4 mg (4 mg Intravenous Given 08/25/21 1933)  iohexol (OMNIPAQUE) 350 MG/ML injection 100 mL (100  mLs Intravenous Contrast Given 08/25/21 1958)  cefTRIAXone (ROCEPHIN) 1 g in sodium chloride 0.9 % 100 mL IVPB (0 g Intravenous Stopped 08/25/21 2140)  sodium chloride 0.9 % bolus 1,000 mL (0 mLs Intravenous Stopped 08/25/21 2145)    ED Course/ Medical Decision Making/ A&P Clinical Course as of 08/25/21 2315  Thu Aug 25, 2021  1902 Istat shows hypokalemia, potassium ordered [JK]  1935 Patient having some vomiting now.  Zofran IV ordered [JK]  2017 Initial troponin elevated at 112.  Potassium decreased to 2.9.  Lactic acid level elevated at 4.  White blood cell count elevated at 20 [JK]  2018 No acute findings noted CT scan of chest abdomen pelvis [JK]  2018 Head CT without acute findings. [JK]  2018 COVID and flu are negative. [JK]  2019 Lactic acid level elevated but no clear signs of infection.  Suspect could be related to stress demargination with her CPR and possible drug overdose [JK]  2023 Patient remains alert and awake.  Blood pressure is stable. [JK]  2039 Discussed with cardiology.  Will come see patient. [JK]    Clinical Course User Index [JK] Dorie Rank, MD                           Medical Decision Making  Patient presented to the ED for evaluation after an episode of unresponsiveness, cardiac arrest.  Patient received CPR and had ROSC.  Had an episode of A. fib RVR but then converted back to sinus rhythm.  EMS administered Narcan and patient subsequently had improvement in her mental status.  Not entirely clear if patient did have accidental opiate overdose resulting in her cardiac arrest.  Right now she has a normal rhythm in has normal vital signs.  Labs do show signs of elevation in  her troponin as well as lactic acid level.  No acute STEMI on EKG.  Possible this troponin is just related to her arrest but we will need to recheck troponin levels.  Patient also has a leukocytosis.  No obvious signs of infection at this time.  This may be stress demargination and lactic acidosis related to her CPR however the patient has been given IV fluids and antibiotics.  Blood cultures have been sent for analysis.  Patient will need a urinalysis.  CT chest abdomen pelvis was performed.  No signs of acute aortic injury.  No signs of acute abdominal pathology.  CT scan of the head also does not show any signs of hemorrhage or other acute abnormality.  We will consult with cardiology considering her cardiac arrest.  I will also consult with the medical service for admission and further treatment.        Final Clinical Impression(s) / ED Diagnoses Final diagnoses:  Cardiac arrest [facility])  Opiate overdose, accidental or unintentional, initial encounter ([place])  Lactic acidosis  Elevated troponin     Dorie Rank, MD 08/25/21 2315

## 2021-08-25 NOTE — ED Notes (Addendum)
Pt provided a password Nicole Hines in case someone calls and request information on the pt. If they provide the password we are aloud to provide them information

## 2021-08-25 NOTE — ED Notes (Signed)
Sent admit provider message reference main point of contact and to call them if they are not here when they come to see the pt

## 2021-08-25 NOTE — ED Notes (Signed)
Per pt this lady is to be  main contact if something changes, she gets a room, and to speak with providers. Ruben Reason daughter-in-law. [phone]

## 2021-08-25 NOTE — ED Notes (Signed)
Placed pt in hospital gown and pt started vomiting. Spoke with provider and received orders for zofran at this time and provider at bedside

## 2021-08-25 NOTE — ED Notes (Signed)
Remains in ct at this time

## 2021-08-25 NOTE — Consult Note (Signed)
Cardiology Consultation:   Patient ID: Babe Anthis Sirico MRN: [id number]; DOB: [birth year]  Admit date: 08/25/2021 Date of Consult: 08/25/2021  PCP:  [name], MD   [facility] Providers Cardiologist:  None        Patient Profile:   Joniya Boberg Kastelic is a 61 y.o. female with a hx of moderate AS, HTN, morbid obesity, MRSA bacteremia c/b septic L sacroilitis and chronic pain who is being seen 08/25/2021 for the evaluation of elevated troponins at the request of Dr. Tomi Bamberger.  History of Present Illness:   The following history was obtained mostly from chart review because the patient does not remember losing consciousness.  Ms. Bowe was in her USOH until today when she was found face down on the ground and unresponsive by her son. Her son went to check on her because he had been unable to get ahold of her that day. The son called 41 and initiated CPR. When EMS arrived the patient was noted to be in PEA arrest. Epi x4 was administered before ROSC was achieved. Post-ROSC rhythm was Afib with RVR then sinus tachycardia. She was still noted to be lethargic post-ROSC so narcan was given. Reportedly when she received narcan her lethargy/confusion completely resolved.  On my examination of her in the ED she endorses having central chest soreness where CPR was performed. She denies having had chest pain prior to CPR. Additionally, she denies having SOB, palpitations, swelling, PND, orthopnea, N/V/D, abdominal pain, urinary changes or rashes. She does endorse having chronic pain 2/2 her prior hip infection for which she takes opioids and benzos. She denies taking any extra of these medications or any recent dose changes. She denies tobacco, ETOH and illicit drug use.  In the ED her VS were hypothermic 97.5, HR 99, BP 102/62, RR 22 and satting 95% on 2L O2 [place]. Labs notable for potassium 2.9, WBC 20.1, lactate 4, and troponin 112 -> 359. CTH with NAICP. CT angio chest negative for dissection. EKG without ST  segment changes. Cardiology is consulted for evaluation of elevated troponins.    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 abdomen   TEE WITHOUT CARDIOVERSION N/A 12/27/2020   Procedure: TRANSESOPHAGEAL ECHOCARDIOGRAM (TEE);  Surgeon: [name], MD;  Location: [facility] ENDOSCOPY;  Service: Cardiovascular;  Laterality: N/A;     Home Medications:  Prior to Admission medications   Medication Sig Start Date End Date Taking? Authorizing Provider  albuterol (VENTOLIN HFA) 108 (90 Base) MCG/ACT inhaler Inhale 2 puffs into the lungs every 4 (four) hours as needed for shortness of breath or wheezing.   Yes [provider]  ALPRAZolam Duanne Moron) 0.5 MG tablet Take 1 mg by mouth at bedtime. For anxiety   Yes [provider]  Calcium Carbonate-Vitamin D (CALCIUM 600+D3 PO) Take 1 tablet by mouth 2 (  two) times daily.   Yes [provider]  citalopram (CELEXA) 40 MG tablet Take 40 mg by mouth every morning.   Yes [provider]  clobetasol cream (TEMOVATE) 1.95 % Apply 1 application topically at bedtime. Around lips 10/18/20  Yes [provider]  doxycycline (VIBRA-TABS) 100 MG tablet Take 1 tablet (100 mg total) by mouth 2 (two) times daily. Patient taking differently: Take 100 mg by mouth 2 (two) times daily. Continuously 03/04/21  Yes Tommy Medal, Lavell Islam, MD  fish oil-omega-3 fatty acids 1000 MG  capsule Take 1 g by mouth 2 (two) times daily.   Yes [provider]  gabapentin (NEURONTIN) 100 MG capsule Take 300 mg by mouth at bedtime. 11/11/20  Yes [provider]  Glucosamine-Chondroit-Vit C-Mn (GLUCOSAMINE 1500 COMPLEX PO) Take 1 capsule by mouth 2 (two) times daily.   Yes [provider]  methocarbamol (ROBAXIN) 750 MG tablet Take 750 mg by mouth 3 (three) times daily as needed for muscle spasms. 06/10/21  Yes [provider]  montelukast (SINGULAIR) 10 MG tablet Take 10 mg by mouth every morning.  10/19/13  Yes [provider]  Multiple Vitamin (MULTIVITAMIN WITH MINERALS) TABS tablet Take 1 tablet by mouth daily.   Yes [provider]  omeprazole (PRILOSEC) 20 MG capsule Take 1 capsule (20 mg total) by mouth daily before breakfast. 12/30/20  Yes Amin, Ankit Chirag, MD  oxyCODONE-acetaminophen (PERCOCET) 10-325 MG per tablet Take 1 tablet by mouth every 6 (six) hours as needed for pain.  10/25/13  Yes [provider]  polyethylene glycol (MIRALAX / GLYCOLAX) 17 g packet Take 17 g by mouth daily as needed for severe constipation. 12/30/20  Yes Amin, Ankit Chirag, MD  telmisartan (MICARDIS) 40 MG tablet Take 40 mg by mouth daily. 01/31/21  Yes [provider]  vitamin B-12 (CYANOCOBALAMIN) 1000 MCG tablet Take 1,000 mcg by mouth daily.   Yes [provider]  amLODipine (NORVASC) 5 MG tablet Take 1 tablet (5 mg total) by mouth daily. Patient not taking: Reported on 08/25/2021 12/30/20   Damita Lack, MD  docusate sodium (COLACE) 100 MG capsule Take 1 capsule (100 mg total) by mouth daily as needed for mild constipation or moderate constipation. Patient not taking: Reported on 08/25/2021 12/30/20 12/30/21  Damita Lack, MD  Fluticasone-Salmeterol (ADVAIR) 250-50 MCG/DOSE AEPB Inhale 2 puffs into the lungs 2 (two) times daily as needed (sob/wheezing). Patient not taking: Reported on 08/25/2021    [provider]  methocarbamol (ROBAXIN) 500 MG tablet Take 1 tablet (500 mg total) by mouth every 8 (eight) hours as needed for muscle spasms. Patient not taking: Reported on 07/04/2021 12/30/20   Damita Lack, MD  senna-docusate (SENOKOT-S) 8.6-50 MG tablet Take 1 tablet by mouth 2 (two) times daily. Patient not taking: Reported on 08/25/2021 12/30/20   Damita Lack, MD    Inpatient Medications: Scheduled Meds:  Continuous Infusions:  sodium chloride 1,000 mL (08/25/21 1900)   cefTRIAXone (ROCEPHIN)  IV     potassium chloride 10 mEq (08/25/21 2028)   sodium chloride     PRN Meds:   Allergies:   No Known Allergies  Social History:   Social History   Socioeconomic History   Marital status: Divorced    Spouse name: Not on file   Number of children: Not on file   Years of education: Not on file   Highest education level: Not on file  Occupational History   Not on  file  Tobacco Use   Smoking status: Never   Smokeless tobacco: Never  Substance and Sexual Activity   Alcohol use: No   Drug use: No   Sexual activity: Yes    Partners: Female    Birth control/protection: Surgical    Comment: btl  Other Topics Concern   Not on file  Social History Narrative   Not on file   Social Determinants of Health   Financial Resource Strain: Not on file  Food Insecurity: Not on file  Transportation Needs: Not on file  Physical Activity: Not on file  Stress: Not on file  Social Connections: Not on file  Intimate Partner Violence: Not on file    Family History:    Family History  Problem Relation Age of Onset   Cancer Mother        pancreatic   Diabetes Mother    Hypertension Mother    Heart attack Father    Heart attack Maternal Grandmother    Diabetes Brother    Cancer Maternal Aunt        uterine   Diabetes Maternal Aunt    Colon cancer Neg Hx      ROS:  Please see the history of present illness.  All other ROS reviewed and negative.     Physical Exam/Data:    Vitals:   08/25/21 1832 08/25/21 1833 08/25/21 1915 08/25/21 2026  BP:  102/62 109/69 106/63  Pulse:  99 (!) 108 91  Resp:  (!) 22 (!) 26 18  Temp:  (!) 97.5 F (36.4 C)    TempSrc:  Oral    SpO2: 94% 95% 98% 96%  Weight:  121.6 kg    Height:  5' 5"  (1.651 m)      Intake/Output Summary (Last 24 hours) at 08/25/2021 2036 Last data filed at 08/25/2021 2028 Gross per 24 hour  Intake 500 ml  Output --  Net 500 ml   Last 3 Weights 08/25/2021 07/04/2021 05/16/2021  Weight (lbs) 268 lb 1.3 oz 268 lb 251 lb  Weight (kg) 121.6 kg 121.564 kg 113.853 kg     Body mass index is 44.61 kg/m.  General:  Stable appearing obese female in NAD, alert and cooperative HEENT: atraumatic, normocephalic Neck: no JVD Vascular: Murmur referred to carotids bilaterally; Distal pulses 2+ bilaterally Cardiac:  normal S1, S2; RRR; III/VI crescendo decrescendo systolic murmur radiating to the carotids heard loudest at RUSB Lungs:  clear to auscultation bilaterally, no wheezing, rhonchi or rales  Abd: soft, nontender, no hepatomegaly  Ext: no edema Musculoskeletal:  No deformities, BUE and BLE strength normal and equal Skin: warm and dry  Neuro:  CNs 2-12 intact, no focal abnormalities noted Psych:  Normal affect   EKG:  The EKG was personally reviewed and demonstrates:  NSR. IVCD  Telemetry:  Telemetry was personally reviewed and demonstrates:  NSR  Relevant CV Studies:   TEE 12/27/20:  IMPRESSIONS     1. The aortic valve is tricuspid with moderate leaflet thickening and  calcification. There is also nodular commisural calcification which is  highly echogenic. This is most consistent with calcification with low  suspicion for valvular vegetations. There  is moderate aortic stenosis with AVA 1.1cm2, mean gradient 71mHg, peak  gradient 68mg, Vmax 3.56m62m DI 0.3. Trivial aortic regurgitation.   2. Left ventricular ejection fraction, by estimation, is 60 to 65%. The  left ventricle has normal  function.   3. Right ventricular systolic function is normal. The right ventricular  size  is normal.   4. No left atrial/left atrial appendage thrombus was detected.   5. The mitral valve is normal in structure. Trivial mitral valve  regurgitation. No evidence of mitral stenosis.   6. There is mild (Grade II) plaque.   7. No distinct valvular vegetations visualized on TEE.     TTE 12/22/20:  IMPRESSIONS     1. Left ventricular ejection fraction, by estimation, is 60 to 65%. The  left ventricle has normal function. The left ventricle has no regional  wall motion abnormalities. There is mild left ventricular hypertrophy.  Left ventricular diastolic parameters  were normal.   2. Right ventricular systolic function is normal. The right ventricular  size is normal.   3. Left atrial size was moderately dilated.   4. The pericardial effusion is anterior to the right ventricle.   5. The mitral valve is normal in structure. No evidence of mitral valve  regurgitation. No evidence of mitral stenosis.   6. Probably tri leaflet . The aortic valve was not well visualized. There  is moderate calcification of the aortic valve. Aortic valve regurgitation  is not visualized. Moderate aortic valve stenosis.   7. The inferior vena cava is normal in size with greater than 50%  respiratory variability, suggesting right atrial pressure of 3 mmHg.   Laboratory Data:  High Sensitivity Troponin:   Recent Labs  Lab 08/25/21 1846  TROPONINIHS 112*     Chemistry Recent Labs  Lab 08/25/21 1846 08/25/21 1900  NA 138 142  K 2.9* 2.9*  CL 103 103  CO2 27  --   GLUCOSE 134* 135*  BUN 15 18  CREATININE 0.77 0.60  CALCIUM 9.8  --   MG 2.5*  --   GFRNONAA >60  --   ANIONGAP 8  --     No results for input(s): PROT, ALBUMIN, AST, ALT, ALKPHOS, BILITOT in the last 168 hours. Lipids No results for input(s): CHOL, TRIG, HDL, LABVLDL, LDLCALC, CHOLHDL in the last 168 hours.  Hematology Recent Labs   Lab 08/25/21 1846 08/25/21 1900  WBC 20.1*  --   RBC 4.22  --   HGB 12.9 13.6  HCT 40.3 40.0  MCV 95.5  --   MCH 30.6  --   MCHC 32.0  --   RDW 13.0  --   PLT 259  --    Thyroid No results for input(s): TSH, FREET4 in the last 168 hours.  BNPNo results for input(s): BNP, PROBNP in the last 168 hours.  DDimer No results for input(s): DDIMER in the last 168 hours.   Radiology/Studies:  CT Head Wo Contrast  Result Date: 08/25/2021 CLINICAL DATA:  Mental status change, unknown cause EXAM: CT HEAD WITHOUT CONTRAST TECHNIQUE: Contiguous axial images were obtained from the base of the skull through the vertex without intravenous contrast. COMPARISON:  03/29/2006. FINDINGS: Brain: No evidence of acute infarction, hemorrhage, cerebral edema, mass, mass effect, or midline shift. No hydrocephalus or extra-axial fluid collection. Vascular: No hyperdense vessel. Skull: Normal. Negative for fracture or focal lesion. Sinuses/Orbits: Mucous retention cyst in the left frontal sinus. Mild mucosal thickening in the ethmoid air cells. The orbits are unremarkable. Other: The mastoid air cells are well aerated. IMPRESSION: IMPRESSION No acute intracranial process. Electronically Signed   By: Merilyn Baba M.D.   On: 08/25/2021 20:00   DG Chest Portable 1 View  Result Date: 08/25/2021 CLINICAL DATA:  CPR. EXAM: PORTABLE CHEST 1 VIEW COMPARISON:  Chest radiograph 01/17/2021 FINDINGS: Chronic cardiomegaly.  Unchanged mediastinal contours allowing for differences in technique. No pneumothorax or pulmonary edema. Bibasilar atelectasis, left greater than right. No pleural effusion. On limited evaluation, no acute osseous abnormalities are seen. IMPRESSION: Chronic cardiomegaly. Bibasilar atelectasis. Electronically Signed   By: Keith Rake M.D.   On: 08/25/2021 18:47   CT Angio Chest/Abd/Pel for Dissection W and/or Wo Contrast  Result Date: 08/25/2021 CLINICAL DATA:  Aortic aneurysm, known or suspected Post CPR.  EXAM: CT ANGIOGRAPHY CHEST, ABDOMEN AND PELVIS TECHNIQUE: Non-contrast CT of the chest was initially obtained. Multidetector CT imaging through the chest, abdomen and pelvis was performed using the standard protocol during bolus administration of intravenous contrast. Multiplanar reconstructed images and MIPs were obtained and reviewed to evaluate the vascular anatomy. CONTRAST:  171m OMNIPAQUE IOHEXOL 350 MG/ML SOLN COMPARISON:  Chest radiograph earlier today. Abdominopelvic CT 12/18/2020. No prior chest CT available. FINDINGS: CTA CHEST FINDINGS Cardiovascular: No aortic hematoma noncontrast exam. The thoracic aorta is tortuous but normal in caliber. No aortic aneurysm or dissection. Conventional branching pattern from the aortic arch exam not tailored for evaluation of pulmonary arteries. No obvious central pulmonary embolus. The heart is upper normal in size. Trace pericardial effusion. Subcentimeter lobulated low-density adjacent to the descending aorta in the lower chest was present on prior exam, may represent lymphatic malformation or dilatation of the as azygous/semi azygous system. Mediastinum/Nodes: No enlarged mediastinal, hilar, or axillary lymph nodes. Patulous distal esophagus with small hiatal hernia. No thyroid nodule. Lungs/Pleura: No pneumothorax. Minimal retained mucus within the trachea and central bronchi. Minor subsegmental atelectasis in the right greater than left lower lobe. No pleural effusion or findings of pulmonary edema. Musculoskeletal: No definite rib fracture, mild motion artifact limits detailed rib assessment. No sternal fracture. Diffuse thoracic spondylosis with multilevel endplate spurring. No confluent chest wall contusion. Review of the MIP images confirms the above findings. CTA ABDOMEN AND PELVIS FINDINGS VASCULAR Aorta: Normal caliber abdominal aorta. No aneurysm. No dissection or acute findings. Celiac: Patent without evidence of aneurysm, dissection, vasculitis or  significant stenosis. SMA: Patent without evidence of aneurysm, dissection, vasculitis or significant stenosis. Renals: Both renal arteries are patent without evidence of aneurysm, dissection, vasculitis, fibromuscular dysplasia or significant stenosis. IMA: Patent. Inflow: Patent without evidence of aneurysm, dissection, vasculitis or significant stenosis. Veins: No obvious venous abnormality within the limitations of this arterial phase study. Review of the MIP images confirms the above findings. NON-VASCULAR Hepatobiliary: Suggestion of decreased hepatic density and micro nodular hepatic contours. No focal hepatic lesion on this arterial phase exam. Cholecystectomy with surgical clips in the gallbladder fossa. Probable dropped clips adjacent to the inferior liver edge. Common bile duct is not well-defined, no evidence of biliary dilatation. Pancreas: Fatty atrophy.  No ductal dilatation or inflammation. Spleen: Normal in size and arterial enhancement. Adrenals/Urinary Tract: No adrenal nodule. Question of heterogeneous bilateral renal enhancement versus artifact from arms down positioning and habitus. No hydronephrosis or focal renal abnormality. Distended urinary bladder without wall thickening. No visualized urolithiasis. Stomach/Bowel: Small hiatal hernia. No bowel obstruction or inflammation. Moderate stool burden with moderate stool distending the rectum. Normal appendix. Lymphatic: Scattered small retroperitoneal lymph nodes are not enlarged by size criteria. No enlarged lymph nodes in the abdomen or pelvis. Reproductive: Quiescent uterus, deviating into the right pelvis. No adnexal mass. Other: No free air or ascites.  No abdominal wall hernia. Musculoskeletal: Multilevel degenerative change in the spine. 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 Review  of the MIP images confirms the above findings. IMPRESSION: 1. No  aortic dissection or acute aortic abnormality. No aneurysm of the thoracoabdominal aorta. 2. Question of heterogeneous bilateral renal enhancement versus artifact from arms down positioning and habitus. Recommend correlation with urinalysis. 3. Patulous distal esophagus with small hiatal hernia. 4. Suggestion of decreased hepatic density and micro nodular hepatic contours, suggesting cirrhosis. Recommend correlation with cirrhosis risk factors 5. Moderate stool burden with moderate stool distending the rectum, suggesting constipation. Aortic Atherosclerosis (ICD10-I70.0). Electronically Signed   By: Keith Rake M.D.   On: 08/25/2021 20:13     Assessment and Plan:   Duru Reiger Galbraith is a 61 y.o. female with a hx of moderate AS, HTN, morbid obesity, MRSA bacteremia c/b septic L sacroilitis and chronic pain who is being seen 08/25/2021 for the evaluation of elevated troponins at the request of Dr. Tomi Bamberger.  #PEA Arrest #Elevated Troponins ::Patient suffered PEA arrest with successful ROSC. The leading suspicion is that she had an overdose of her home opioids/benzos somehow. Cardiology was asked to weigh in on the possibility of this being ACS driven PEA arrest. Currently, my suspicion for this is low. I think that her current chest pain is 2/2 CPR being performed for 8 minutes and that her troponin elevation is 2/2 1) cardiac arrest and 2) CPR. I can think of no greater stress on the heart than death so elevated troponins in the setting are common. Furthermore, the patient has not had any chest pain or SOB prior to this event. Even though my suspicion for ACS driving this event is low, I do recommend getting a formal TTE to look for WMAs and EF. My POCUS was limited by body habitus. If there is evidence of WMAs then can consider further eval for CAD. -TTE  #Transient Atrial Fibrillation ::No known history of AF. Transient episode of Afib with RVR following ROSC. I think this was from agitated myocardium  in the setting of CPR and heavy catecholamine (epi x4) administration. I think we should keep her on tele and if there is no evidence of Afib then no need to treat as such. -Maintain telemetry      Risk Assessment/Risk Scores:     HEAR Score (for undifferentiated chest pain):  HEAR Score: 3          For questions or updates, please contact [facility] Please consult [email or web address] for contact info under    Signed, Hershal Coria, MD  08/25/2021 8:36 PM

## 2021-08-25 NOTE — ED Notes (Signed)
Received verbal report from Evangeline at this time

## 2021-08-25 NOTE — ED Notes (Signed)
EMS also reported per family that pt was on abx for MRSA, has rx for percocet and xanax, unknown amount consumed if any

## 2021-08-25 NOTE — ED Triage Notes (Signed)
Son called 911 after he found the pt unresponsive, EMS arrived and began CPR, 1 round of epi and 8 min of CPR performed ROSC was obtained and pt went into a-fib RVR, IO inserted and narcan was administered, pt became combative but was able to be calmed, upon arrival pt was in NSR, GCS of 15, but lethargic

## 2021-08-25 NOTE — Assessment & Plan Note (Addendum)
Obtain hepatitis cerologies never drank check lipid panel, check annomia INR Will need follow up with GI Reports her PCP did her colonoscopy

## 2021-08-25 NOTE — Assessment & Plan Note (Addendum)
Due to PEA arrest appreciate cardiology consult Obtain ECHO  Correct electrolytes Treat possible infection rehydrate

## 2021-08-25 NOTE — ED Notes (Signed)
Cardio provider at bedside at this time

## 2021-08-25 NOTE — Assessment & Plan Note (Addendum)
On doxycycline Broaden antibiotic coverage for tonight cannot rule out sepsis may benefit from ID consult in a.m.

## 2021-08-25 NOTE — Assessment & Plan Note (Addendum)
Initially in A.fib briefly now in sinus Reports she bleeds easilyt will check INR Defer to cardiology if needs to be anticoagulated

## 2021-08-25 NOTE — H&P (Signed)
Nicole Hines ZOX:[id number] DOB: 1960-10-22 DOA: 08/25/2021     PCP: Creola Corn, MD   Outpatient Specialists:     [facility] Patient arrived to ER on 08/25/21 at 1809 Referred by Attending Linwood Dibbles, MD   Patient coming from: home Lives  With family    Chief Complaint:   Chief Complaint  Patient presents with   Respiratory Distress    HPI: Nicole Hines is a 61 y.o. female with medical history significant of MRSA septic joint    Presented with   syncope Found unresponsive, EMS arrived began CPR after 1 round of epi and 8 min of CPR ROCS Notd in A.fib RVR pt given a dose of NArcan and became combative  Recent ABx for MRSA infection followed by Dr. Algis Liming Has been on percocet and xanax   Ever since the patient has been breathing on her own.  She does not remember what happened today.  She denies taking any excess medications.  She is having some pain in her chest and abdomen now after CPR   She has not been eating or drinking well during the day This was her day off She was found by her son in the living room face down The last thing she remembers were putting clothes out of the dryer on her bed She is not diabetic She only been on BP meds since MAy  Has been compliant with doxycycline No recent fever Her hips still hurt she does a lot of walking She denies taking extra percocet  She usually takes 2 in AM she took some throughout the day 1 every 4h Has been on percocet for years No tobacco no EtOH Now feels very tired  The day before she tripped and fell She only takes xanax at night to help her sleep did not take any extra No black stools no bood in stool Has  been vaccinated against COVID  and boosted had   flu shot   Initial COVID TEST  NEGATIVE   Lab Results  Component Value Date   SARSCOV2NAA NEGATIVE 08/25/2021   SARSCOV2NAA NEGATIVE 12/18/2020     Regarding pertinent Chronic problems:       HTN on Micardis, Norvasc  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  AS -  echo May 2022 moderate aortic stenosis with AVA 1.1cm2, mean gradient , peak  gradient , Vmax 3.41m/s, DI 0.3. Trivial aortic regurgitation.     Morbid obesity-   BMI Readings from Last 1 Encounters:  08/25/21 44.61 kg/m       Asthma -well   controlled on home inhalers/ nebs    While in ER: Clinical Course as of 08/25/21 2046  Thu Aug 25, 2021  1902 Istat shows hypokalemia, potassium ordered [JK]  1935 Patient having some vomiting now.  Zofran IV ordered [JK]  2017 Initial troponin elevated at 112.  Potassium decreased to 2.9.  Lactic acid level elevated at 4.  White blood cell count elevated at 20 [JK]  2018 No acute findings noted CT scan of chest abdomen pelvis [JK]  2018 Head CT without acute findings. [JK]  2018 COVID and flu are negative. [JK]  2019 Lactic acid level elevated but no clear signs of  infection.  Suspect could be related to stress demargination with her CPR and possible drug overdose [JK]  2023 Patient remains alert and awake.  Blood pressure is stable. [JK]  2039 Discussed with cardiology.  Will come see patient. [JK]    Clinical Course User Index [JK] Linwood Dibbles, MD    ECG showing 99   Ordered  CT HEAD   NON acute  CXR - Chronic cardiomegaly. Bibasilar atelectasis.  CTabd/pelvis - Moderate stool burden with moderate stool distending the rectum, suggesting constipation. Suggestion of decreased hepatic density and micro nodular hepatic contours, suggesting cirrhosis  CTA chest -  nonacute, no large PE,   no evidence of infiltrate Minimal retained mucus within the trachea and central bronchi.  Following Medications were ordered in  ER: Medications  sodium chloride 0.9 % bolus 500 mL (0 mLs Intravenous Stopped 08/25/21 2028)    Followed by  0.9 %  sodium chloride infusion (1,000 mLs Intravenous New Bag/Given 08/25/21 1900)  potassium chloride 10 mEq in 100 mL IVPB (10 mEq Intravenous New Bag/Given 08/25/21 2028)  cefTRIAXone (ROCEPHIN) 1 g in sodium chloride 0.9 % 100 mL IVPB (has no administration in time range)  sodium chloride 0.9 % bolus 1,000 mL (has no administration in time range)  ondansetron (ZOFRAN) injection 4 mg (4 mg Intravenous Given 08/25/21 1933)  iohexol (OMNIPAQUE) 350 MG/ML injection 100 mL (100 mLs Intravenous Contrast Given 08/25/21 1958)    _______________________________________________________ ER Provider Called:  [facility]    Dr. Lendell Caprice They Recommend admit to medicine    SEEN in ER   ED Triage Vitals  Enc Vitals Group     BP 08/25/21 1833 102/62     Pulse Rate 08/25/21 1833 99     Resp 08/25/21 1833 (!) 22     Temp 08/25/21 1833 (!) 97.5 F (36.4 C)     Temp Source 08/25/21 1833 Oral     SpO2 08/25/21 1826 100 %     Weight 08/25/21 1833 268 lb 1.3 oz (121.6 kg)     Height 08/25/21 1833 5\' 5"  (1.651 m)     Head Circumference --      Peak Flow --      Pain Score 08/25/21 1833 0     Pain Loc --      Pain Edu? --      Excl. in GC? --   TMAX(24)@     _________________________________________ Significant initial  Findings: Abnormal Labs Reviewed  BASIC METABOLIC PANEL - Abnormal; Notable for the following components:      Result Value   Potassium 2.9 (*)    Glucose, Bld 134 (*)    All other components within normal limits  CBC - Abnormal; Notable for the following components:   WBC 20.1 (*)    All other components within normal limits  LACTIC ACID, PLASMA - Abnormal; Notable for the following components:   Lactic Acid, Venous 4.0 (*)    All other components within normal limits  MAGNESIUM - Abnormal; Notable for the following components:   Magnesium 2.5 (*)    All other components  within normal limits  DIFFERENTIAL - Abnormal; Notable for the following components:   Neutro Abs 12.8 (*)    Monocytes Absolute 1.3 (*)    Abs Immature Granulocytes 0.10 (*)    All other components within normal limits  HEPATIC FUNCTION PANEL - Abnormal; Notable for the following components:   AST 72 (*)    ALT 63 (*)    All other  components within normal limits  D-DIMER, QUANTITATIVE - Abnormal; Notable for the following components:   D-Dimer, Quant 5.82 (*)    All other components within normal limits  CBC - Abnormal; Notable for the following components:   WBC 15.0 (*)    Hemoglobin 11.8 (*)    All other components within normal limits  CBG MONITORING, ED - Abnormal; Notable for the following components:   Glucose-Capillary 129 (*)    All other components within normal limits  I-STAT CHEM 8, ED - Abnormal; Notable for the following components:   Potassium 2.9 (*)    Glucose, Bld 135 (*)    All other components within normal limits  TROPONIN I (HIGH SENSITIVITY) - Abnormal; Notable for the following components:   Troponin I (High Sensitivity) 112 (*)    All other components within normal limits  TROPONIN I (HIGH SENSITIVITY) - Abnormal; Notable for the following components:   Troponin I (High Sensitivity) 371 (*)    All other components within normal limits  TROPONIN I (HIGH SENSITIVITY) - Abnormal; Notable for the following components:   Troponin I (High Sensitivity) 359 (*)    All other components within normal limits    _________________________ Troponin  110 -350 - 371 ECG: Ordered Personally reviewed by me showing: HR : 99 Rhythm:  NSR,  Left deviation Ischemic changes*nonspecific changes, no evidence of ischemic changes QTC 509   The recent clinical data is shown below. Vitals:   08/25/21 1832 08/25/21 1833 08/25/21 1915 08/25/21 2026  BP:  102/62 109/69 106/63  Pulse:  99 (!) 108 91  Resp:  (!) 22 (!) 26 18  Temp:  (!) 97.5 F (36.4 C)    TempSrc:  Oral     SpO2: 94% 95% 98% 96%  Weight:  121.6 kg    Height:  5\' 5"  (1.651 m)        WBC     Component Value Date/Time   WBC 20.1 (H) 08/25/2021 1846   LYMPHSABS 2,776 07/04/2021 1648   MONOABS 0.6 12/29/2020 0327   EOSABS 248 07/04/2021 1648   BASOSABS 48 07/04/2021 1648    Lactic Acid, Venous    Component Value Date/Time   LATICACIDVEN 4.0 (HH) 08/25/2021 1846      Procalcitonin  0.11 Lactic Acid, Venous    Component Value Date/Time   LATICACIDVEN 1.2 08/25/2021 2054      UA  ordered  Results for orders placed or performed during the hospital encounter of 08/25/21  Resp Panel by RT-PCR (Flu A&B, Covid) Nasopharyngeal Swab     Status: None   Collection Time: 08/25/21  6:22 PM   Specimen: Nasopharyngeal Swab; Nasopharyngeal(NP) swabs in vial transport medium  Result Value Ref Range Status   SARS Coronavirus 2 by RT PCR NEGATIVE NEGATIVE Final         Influenza A by PCR NEGATIVE NEGATIVE Final   Influenza B by PCR NEGATIVE NEGATIVE Final          _______________________________________________ Hospitalist was called for admission for Syncope PEA arrest  The following Work up has been ordered so far:  Orders Placed This Encounter  Procedures   Critical Care   1-3 Lead EKG Interpretation   Resp Panel by RT-PCR (Flu A&B, Covid) Nasopharyngeal Swab   Blood culture (routine x 2)   CT Head Wo Contrast   CT Angio Chest/Abd/Pel for Dissection W and/or Wo Contrast   DG Chest Portable 1 View   Basic metabolic panel   CBC  Lactic acid, plasma   Magnesium   Magnesium   Urinalysis, Routine w reflex microscopic   Cardiac monitoring   Initiate Carrier Fluid Protocol   Inpatient consult to Cardiology   Consult to hospitalist   Pulse oximetry, continuous   CBG monitoring, ED   I-stat chem 8, ED (not at [facility] or [facility])   EKG 12-Lead   Insert peripheral IV     OTHER Significant initial  Findings:  labs showing:    Recent Labs  Lab 08/25/21 1846 08/25/21 1900  NA  138 142  K 2.9* 2.9*  CO2 27  --   GLUCOSE 134* 135*  BUN 15 18  CREATININE 0.77 0.60  CALCIUM 9.8  --   MG 2.5*  --     Cr   stable,   Lab Results  Component Value Date   CREATININE 0.60 08/25/2021   CREATININE 0.77 08/25/2021   CREATININE 0.63 07/04/2021    Recent Labs  Lab 08/25/21 2054  AST 72*  ALT 63*  ALKPHOS 101  BILITOT 0.4  PROT 7.1  ALBUMIN 3.5   Lab Results  Component Value Date   CALCIUM 9.8 08/25/2021       Plt: Lab Results  Component Value Date   PLT 259 08/25/2021    COVID-19 Labs  Recent Labs    08/25/21 2054  DDIMER 5.82*    Lab Results  Component Value Date   SARSCOV2NAA NEGATIVE 08/25/2021   SARSCOV2NAA NEGATIVE 12/18/2020    Venous  Blood Gas result: ordered  ABG    Component Value Date/Time   TCO2 27 08/25/2021 1900       Recent Labs  Lab 08/25/21 1846 08/25/21 1900  WBC 20.1*  --   HGB 12.9 13.6  HCT 40.3 40.0  MCV 95.5  --   PLT 259  --     HG/HCT  stable,      Component Value Date/Time   HGB 13.6 08/25/2021 1900   HCT 40.0 08/25/2021 1900   MCV 95.5 08/25/2021 1846      No results for input(s): LIPASE, AMYLASE in the last 168 hours. No results for input(s): AMMONIA in the last 168 hours.    Cardiac Panel (last 3 results) Recent Labs    08/25/21 2054  CKTOTAL 175     DM  labs:  HbA1C: No results for input(s): HGBA1C in the last 8760 hours.     CBG (last 3)  Recent Labs    08/25/21 1849  GLUCAP 129*          Cultures:    Component Value Date/Time   SDES  12/27/2020 1752    BLOOD LEFT HAND Performed at [facility], [address], [place], [place] [address]    SPECREQUEST  12/27/2020 1752    BOTTLES DRAWN AEROBIC ONLY Blood Culture results may not be optimal due to an inadequate volume of blood received in culture bottles Performed at [facility], [address] [place], [place] [address]    CULT  12/27/2020 1752    NO GROWTH 5  DAYS Performed at [facility], [address], [place], [place] [address]    REPTSTATUS 01/01/2021 FINAL 12/27/2020 1752     Radiological Exams on Admission: CT Head Wo Contrast  Result Date: 08/25/2021 CLINICAL DATA:  Mental status change, unknown cause EXAM: CT HEAD WITHOUT CONTRAST TECHNIQUE: Contiguous axial images were obtained from the base of the skull through the vertex without intravenous contrast. COMPARISON:  03/29/2006.  FINDINGS: Brain: No evidence of acute infarction, hemorrhage, cerebral edema, mass, mass effect, or midline shift. No hydrocephalus or extra-axial fluid collection. Vascular: No hyperdense vessel. Skull: Normal. Negative for fracture or focal lesion. Sinuses/Orbits: Mucous retention cyst in the left frontal sinus. Mild mucosal thickening in the ethmoid air cells. The orbits are unremarkable. Other: The mastoid air cells are well aerated. IMPRESSION: IMPRESSION No acute intracranial process. Electronically Signed   By: Wiliam Ke M.D.   On: 08/25/2021 20:00   DG Chest Portable 1 View  Result Date: 08/25/2021 CLINICAL DATA:  CPR. EXAM: PORTABLE CHEST 1 VIEW COMPARISON:  Chest radiograph 01/17/2021 FINDINGS: Chronic cardiomegaly. Unchanged mediastinal contours allowing for differences in technique. No pneumothorax or pulmonary edema. Bibasilar atelectasis, left greater than right. No pleural effusion. On limited evaluation, no acute osseous abnormalities are seen. IMPRESSION: Chronic cardiomegaly. Bibasilar atelectasis. Electronically Signed   By: Narda Rutherford M.D.   On: 08/25/2021 18:47   CT Angio Chest/Abd/Pel for Dissection W and/or Wo Contrast  Result Date: 08/25/2021 CLINICAL DATA:  Aortic aneurysm, known or suspected Post CPR. EXAM: CT ANGIOGRAPHY CHEST, ABDOMEN AND PELVIS TECHNIQUE: Non-contrast CT of the chest was initially obtained. Multidetector CT imaging through the chest, abdomen and pelvis was performed using the standard protocol during bolus  administration of intravenous contrast. Multiplanar reconstructed images and MIPs were obtained and reviewed to evaluate the vascular anatomy. CONTRAST:  OMNIPAQUE IOHEXOL 350 MG/ML SOLN COMPARISON:  Chest radiograph earlier today. Abdominopelvic CT 12/18/2020. No prior chest CT available. FINDINGS: CTA CHEST FINDINGS Cardiovascular: No aortic hematoma noncontrast exam. The thoracic aorta is tortuous but normal in caliber. No aortic aneurysm or dissection. Conventional branching pattern from the aortic arch exam not tailored for evaluation of pulmonary arteries. No obvious central pulmonary embolus. The heart is upper normal in size. Trace pericardial effusion. Subcentimeter lobulated low-density adjacent to the descending aorta in the lower chest was present on prior exam, may represent lymphatic malformation or dilatation of the as azygous/semi azygous system. Mediastinum/Nodes: No enlarged mediastinal, hilar, or axillary lymph nodes. Patulous distal esophagus with small hiatal hernia. No thyroid nodule. Lungs/Pleura: No pneumothorax. Minimal retained mucus within the trachea and central bronchi. Minor subsegmental atelectasis in the right greater than left lower lobe. No pleural effusion or findings of pulmonary edema. Musculoskeletal: No definite rib fracture, mild motion artifact limits detailed rib assessment. No sternal fracture. Diffuse thoracic spondylosis with multilevel endplate spurring. No confluent chest wall contusion. Review of the MIP images confirms the above findings. CTA ABDOMEN AND PELVIS FINDINGS VASCULAR Aorta: Normal caliber abdominal aorta. No aneurysm. No dissection or acute findings. Celiac: Patent without evidence of aneurysm, dissection, vasculitis or significant stenosis. SMA: Patent without evidence of aneurysm, dissection, vasculitis or significant stenosis. Renals: Both renal arteries are patent without evidence of aneurysm, dissection, vasculitis, fibromuscular dysplasia or  significant stenosis. IMA: Patent. Inflow: Patent without evidence of aneurysm, dissection, vasculitis or significant stenosis. Veins: No obvious venous abnormality within the limitations of this arterial phase study. Review of the MIP images confirms the above findings. NON-VASCULAR Hepatobiliary: Suggestion of decreased hepatic density and micro nodular hepatic contours. No focal hepatic lesion on this arterial phase exam. Cholecystectomy with surgical clips in the gallbladder fossa. Probable dropped clips adjacent to the inferior liver edge. Common bile duct is not well-defined, no evidence of biliary dilatation. Pancreas: Fatty atrophy.  No ductal dilatation or inflammation. Spleen: Normal in size and arterial enhancement. Adrenals/Urinary Tract: No adrenal nodule. Question of heterogeneous bilateral renal enhancement versus artifact  from arms down positioning and habitus. No hydronephrosis or focal renal abnormality. Distended urinary bladder without wall thickening. No visualized urolithiasis. Stomach/Bowel: Small hiatal hernia. No bowel obstruction or inflammation. Moderate stool burden with moderate stool distending the rectum. Normal appendix. Lymphatic: Scattered small retroperitoneal lymph nodes are not enlarged by size criteria. No enlarged lymph nodes in the abdomen or pelvis. Reproductive: Quiescent uterus, deviating into the right pelvis. No adnexal mass. Other: No free air or ascites.  No abdominal wall hernia. Musculoskeletal: Multilevel degenerative change in the spine. 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 Review of the MIP images confirms the above findings. IMPRESSION: 1. No aortic dissection or acute aortic abnormality. No aneurysm of the thoracoabdominal aorta. 2. Question of heterogeneous bilateral renal enhancement versus artifact from arms down positioning and habitus. Recommend correlation with  urinalysis. 3. Patulous distal esophagus with small hiatal hernia. 4. Suggestion of decreased hepatic density and micro nodular hepatic contours, suggesting cirrhosis. Recommend correlation with cirrhosis risk factors 5. Moderate stool burden with moderate stool distending the rectum, suggesting constipation. Aortic Atherosclerosis (ICD10-I70.0). Electronically Signed   By: Narda Rutherford M.D.   On: 08/25/2021 20:13   _______________________________________________________________________________________________________ Latest  Blood pressure 106/63, pulse 91, temperature (!) 97.5 F (36.4 C), temperature source Oral, resp. rate 18, height 5\' 5"  (1.651 m), weight 121.6 kg, last menstrual period 02/18/2014, SpO2 96 %.   Vitals  labs and radiology finding personally reviewed  Review of Systems:    Pertinent positives include:  , fatigue,   Constitutional:  No weight loss, night sweats, Fevers, chillsweight loss  HEENT:  No headaches, Difficulty swallowing,Tooth/dental problems,Sore throat,  No sneezing, itching, ear ache, nasal congestion, post nasal drip,  Cardio-vascular:  No chest pain, Orthopnea, PND, anasarca, dizziness, palpitations.no Bilateral lower extremity swelling  GI:  No heartburn, indigestion, abdominal pain, nausea, vomiting, diarrhea, change in bowel habits, loss of appetite, melena, blood in stool, hematemesis Resp:  no shortness of breath at rest. No dyspnea on exertion, No excess mucus, no productive cough, No non-productive cough, No coughing up of blood.No change in color of mucus.No wheezing. Skin:  no rash or lesions. No jaundice GU:  no dysuria, change in color of urine, no urgency or frequency. No straining to urinate.  No flank pain.  Musculoskeletal:  No joint pain or no joint swelling. No decreased range of motion. No back pain.  Psych:  No change in mood or affect. No depression or anxiety. No memory loss.  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  abdomen   TEE WITHOUT CARDIOVERSION N/A 12/27/2020   Procedure: TRANSESOPHAGEAL ECHOCARDIOGRAM (TEE);  Surgeon: Meriam Sprague, MD;  Location: [facility] ENDOSCOPY;  Service: Cardiovascular;  Laterality: N/A;    Social History:  Ambulatory   independently       reports that she has never smoked. She has never used smokeless tobacco. She reports that she does not drink alcohol and does not use drugs.     Family History:   Family History  Problem Relation Age of Onset   Cancer Mother        pancreatic   Diabetes Mother    Hypertension Mother    Heart attack Father    Heart attack Maternal Grandmother    Diabetes Brother    Cancer Maternal Aunt        uterine   Diabetes Maternal Aunt    Colon cancer Neg Hx    ______________________________________________________________________________________________ Allergies: No Known Allergies   Prior to Admission medications   Medication Sig Start Date End Date Taking? Authorizing Provider  ALPRAZolam Prudy Feeler) 0.5 MG tablet Take 1 mg by mouth at bedtime as needed for  sleep. For anxiety    [provider]  amLODipine (NORVASC) 5 MG tablet Take 1 tablet (5 mg total) by mouth daily. 12/30/20   Amin, Loura Halt, MD  Calcium Carbonate-Vitamin D (CALCIUM 600+D3 PO) Take 1 tablet by mouth 2 (two) times daily.    [provider]  citalopram (CELEXA) 40 MG tablet Take 40 mg by mouth every morning.    [provider]  clobetasol cream (TEMOVATE) 0.05 % Apply 1 application topically at bedtime. 10/18/20   [provider]  docusate sodium (COLACE) 100 MG capsule Take 1 capsule (100 mg total) by mouth daily as needed for mild constipation or moderate constipation. 12/30/20 12/30/21  Amin, Loura Halt, MD  doxycycline (VIBRA-TABS) 100 MG tablet Take 1 tablet (100 mg total) by mouth 2 (two) times daily. 03/04/21   Randall Hiss, MD  fish oil-omega-3 fatty acids 1000 MG capsule Take 1 g by mouth 2 (two) times daily.    [provider]  Fluticasone-Salmeterol (ADVAIR) 250-50 MCG/DOSE AEPB Inhale 2 puffs into the lungs 2 (two) times daily as needed (sob/wheezing).    [provider]  gabapentin (NEURONTIN) 100 MG capsule Take 300 mg by mouth at bedtime. 11/11/20   [provider]  Glucosamine-Chondroit-Vit C-Mn (GLUCOSAMINE 1500 COMPLEX PO) Take 1 capsule by mouth 2 (two) times daily.    [provider]  methocarbamol (ROBAXIN) 500 MG tablet Take 1 tablet (500 mg total) by mouth every 8 (eight) hours as needed for muscle spasms. Patient not taking: Reported on 07/04/2021 12/30/20   Dimple Nanas, MD  methocarbamol (ROBAXIN) 750 MG tablet Take 750 mg by mouth 3 (three) times daily as needed. 06/10/21   [provider]  montelukast (SINGULAIR) 10 MG tablet Take 10 mg by mouth every morning.  10/19/13   [provider]  Multiple Vitamin (MULTIVITAMIN WITH MINERALS) TABS tablet Take 1 tablet by mouth daily.    [provider]  omeprazole (PRILOSEC) 20 MG capsule Take 1 capsule (20  mg total) by mouth daily before breakfast. 12/30/20   Amin, Loura Halt, MD  oxyCODONE-acetaminophen (PERCOCET) 10-325 MG per tablet Take 1 tablet by mouth every 6 (six) hours as needed for pain.  10/25/13   [provider]  polyethylene glycol (MIRALAX / GLYCOLAX) 17 g packet Take 17 g by mouth daily as needed for severe  constipation. 12/30/20   Amin, Ankit Chirag, MD  senna-docusate (SENOKOT-S) 8.6-50 MG tablet Take 1 tablet by mouth 2 (two) times daily. 12/30/20   Amin, Loura Halt, MD  telmisartan (MICARDIS) 40 MG tablet Take 40 mg by mouth daily. 01/31/21   [provider]  vitamin B-12 (CYANOCOBALAMIN) 1000 MCG tablet Take 1,000 mcg by mouth daily.    [provider]    ___________________________________________________________________________________________________ Physical Exam: Vitals with BMI 08/25/2021 08/25/2021 08/25/2021  Height - - 5\' 5"   Weight - - 268 lbs 1 oz  BMI - - 44.61  Systolic 106 109 161  Diastolic 63 69 62  Pulse 91 108 99     1. General:  in No  Acute distress   Chronically ill  -appearing 2. Psychological: tired but   Oriented 3. Head/ENT:    Dry Mucous Membranes                          Head Non traumatic, neck supple                          Poor Dentition 4. SKIN:  decreased Skin turgor,  Skin clean Dry and intact no rash 5. Heart: Regular rate and rhythm systolic Murmur, no Rub or gallop 6. Lungs:  no wheezes or crackles   7. Abdomen: Soft,  non-tender, Non distended  bowel sounds present 8. Lower extremities: no clubbing, cyanosis,  mild L>R edema 9. Neurologically Grossly intact, moving all 4 extremities equally  10. MSK: Normal range of motion    Chart has been reviewed  ______________________________________________________________________________________________  Assessment/Plan 61 y.o. female with medical history significant of MRSA septic joint  Admitted for PEA arrest  Present on Admission:  Morbid obesity (HCC)   Asthma, chronic, unspecified asthma severity, with acute exacerbation  Opioid overdose (HCC)  Atrial fibrillation with RVR (HCC)  Septic joint (HCC)  Syncope  Other cirrhosis of liver (HCC)  Leukocytosis  Prolonged QT interval  Acute metabolic encephalopathy  Sepsis (HCC)  PEA (Pulseless electrical activity) (HCC)     Morbid obesity (HCC) Body mass index is 44.61 kg/m. Follow up as an outpatient with nutrition  Asthma, chronic, unspecified asthma severity, with acute exacerbation Cont prn albuterol  Atrial fibrillation with RVR (HCC) Initially in A.fib briefly now in sinus Reports she bleeds easilyt will check INR Defer to cardiology if needs to be anticoagulated     Septic joint (HCC) On doxycycline Broaden antibiotic coverage for tonight cannot rule out sepsis may benefit from ID consult in a.m.   Other cirrhosis of liver (HCC) Obtain hepatitis cerologies never drank check lipid panel, check annomia INR Will need follow up with GI Reports her PCP did her colonoscopy  Syncope Due to PEA arrest appreciate cardiology consult Obtain ECHO  Correct electrolytes Treat possible infection rehydrate  Leukocytosis In the setting of recent PEA arrest Await UA but so far no sources of infection,   Prolonged QT interval - will monitor on tele avoid QT prolonging medications, rehydrate correct electrolytes   Acute metabolic encephalopathy   - most likely multifactorial secondary to combination of  Possible infection dehydration secondary to decreased by mouth intake,   polypharmacy   - Will rehydrate   - treat underlining infection   - Hold contributing medications   - if no improvement may need further imaging to evaluate for CNS pathology pathology such as MRI of the brain   - neurological  exam appears to be nonfocal but patient unable to cooperate fully   - VBG ordered    - ammonia ordered   Sepsis (HCC)  -SIRS criteria met with elevated white blood cell  count,       Component Value Date/Time   WBC 15.0 (H) 08/25/2021 2054   LYMPHSABS 0.7 08/25/2021 2054    tachycardia   ,  Today's Vitals   08/25/21 2300 08/25/21 2315 08/25/21 2330 08/25/21 2345  BP: 100/66 100/65 (!) 83/42 98/71  Pulse: 89 81 82 91  Resp: 20 16 15 20   Temp:      TempSrc:      SpO2: 99% 93% 93% 96%  Weight:      Height:      PainSc:       Body mass index is 44.61 kg/m.  This patient meets SIRS Criteria and may be septic. SIRS = Systemic Inflammatory Response Syndrome  The recent clinical data is shown below. Vitals:   08/25/21 2300 08/25/21 2315 08/25/21 2330 08/25/21 2345  BP: 100/66 100/65 (!) 83/42 98/71  Pulse: 89 81 82 91  Resp: 20 16 15 20   Temp:      TempSrc:      SpO2: 99% 93% 93% 96%  Weight:      Height:             Cellulitis, soft tissue infection, viral,  Source of sepsis is unknown but given clinical picture will continue to treat   Patient meeting criteria for Severe sepsis with    evidence of end organ damage/organ dysfunction such as    Lab Results  Component Value Date   CREATININE 0.60 08/25/2021   CREATININE 0.77 08/25/2021    elevated lactic acid >2    acute metabolic encephalopathy  SBP<90 mmhg or MAP < 65 mmhg,   Acute hypoxia requiring new supplemental oxygen, SpO2: 96 % O2 Flow Rate (L/min): 3 L/min     - Obtain serial lactic acid and procalcitonin level.  - Initiated IV antibiotics in ER: Antibiotics Given (last 72 hours)     Date/Time Action Medication Dose Rate   08/25/21 2100 New Bag/Given   cefTRIAXone (ROCEPHIN) 1 g in sodium chloride 0.9 % 100 mL IVPB 1 g 200 mL/hr       Will continue  on :broad spectrum vanc, cefepime, falgyl   - await results of blood and urine culture   Intravenous fluids were administered     12:37 AM   PEA (Pulseless electrical activity) (HCC) Down time unknown patient was found agonal CPR for 6 minutes epi x4 Including initial rhythm was bradycardia PEA narrow QRS.  After  ROSC recent A. fib with RVR and converted to sinus tach Mental status improved somewhat with Narcan Cardiology aware appreciate their consult  echogram cardiac cycle cardiac enzymes correct electrolytes  Opioid overdose (HCC) Hold off on narcotics for tonight hold off on sedating medications for tonight  MRSA (methicillin resistant Staphylococcus aureus) carrier Cover event.  Given cardiac murmur obtain echogram   Other plan as per orders.  DVT prophylaxis:  SCD     Code Status:    Code Status: Prior FULL CODE  as per patient   I had personally discussed CODE STATUS with patient     Family Communication:   Family not at  Bedside    Disposition Plan:    To home once workup is complete and patient is stable   Following barriers for discharge:  Electrolytes corrected                             white count improving able to transition to PO antibiotics                             Will need to be able to tolerate PO                            Will likely need home health, home O2, set up                           Will need consultants to evaluate patient prior to discharge                       Would benefit from PT/OT eval prior to DC  Ordered                                        Consults called:    Cardiology is aware  Admission status:  ED Disposition     ED Disposition  Admit   Condition  --   Comment  Hospital Area: [facility] [[id number]]  Level of Care: Progressive [102]  Admit to Progressive based on following criteria: MULTISYSTEM THREATS such as stable sepsis, metabolic/electrolyte imbalance with or without encephalopathy that is responding to early treatment.  May place patient in observation at [facility] or [facility] if equivalent level of care is available:: No  Covid Evaluation: Asymptomatic Screening Protocol (No Symptoms)  Diagnosis: Syncope [206001]  Admitting Physician: Therisa Doyne [[id number]]   Attending Physician: Therisa Doyne [[id number]]           Obs       Level of care    progressive tele indefinitely please discontinue once patient no longer qualifies COVID-19 Labs    Lab Results  Component Value Date   SARSCOV2NAA NEGATIVE 08/25/2021     Precautions: admitted as   Covid Negative      Sydnie Sigmund 08/26/2021, 1:31 AM    [facility]     after 2 AM please page floor coverage PA If 7AM-7PM, please contact the day team taking care of the patient using [email or web address]   Patient was evaluated in the context of the global COVID-19 pandemic, which necessitated consideration that the patient might be at risk for infection with the SARS-CoV-2 virus that causes COVID-19. Institutional protocols and algorithms that pertain to the evaluation of patients at risk for COVID-19 are in a state of rapid change based on information released by regulatory bodies including the CDC and federal and state organizations. These policies and algorithms were followed during the patient's care.

## 2021-08-26 ENCOUNTER — Observation Stay (HOSPITAL_COMMUNITY): Payer: PRIVATE HEALTH INSURANCE

## 2021-08-26 DIAGNOSIS — G9341 Metabolic encephalopathy: Secondary | ICD-10-CM | POA: Diagnosis present

## 2021-08-26 DIAGNOSIS — R609 Edema, unspecified: Secondary | ICD-10-CM

## 2021-08-26 DIAGNOSIS — B9562 Methicillin resistant Staphylococcus aureus infection as the cause of diseases classified elsewhere: Secondary | ICD-10-CM | POA: Diagnosis present

## 2021-08-26 DIAGNOSIS — A419 Sepsis, unspecified organism: Secondary | ICD-10-CM | POA: Diagnosis present

## 2021-08-26 DIAGNOSIS — I469 Cardiac arrest, cause unspecified: Secondary | ICD-10-CM | POA: Diagnosis present

## 2021-08-26 DIAGNOSIS — J9601 Acute respiratory failure with hypoxia: Secondary | ICD-10-CM | POA: Diagnosis present

## 2021-08-26 DIAGNOSIS — Z8049 Family history of malignant neoplasm of other genital organs: Secondary | ICD-10-CM | POA: Diagnosis not present

## 2021-08-26 DIAGNOSIS — F1120 Opioid dependence, uncomplicated: Secondary | ICD-10-CM | POA: Diagnosis present

## 2021-08-26 DIAGNOSIS — Z8249 Family history of ischemic heart disease and other diseases of the circulatory system: Secondary | ICD-10-CM | POA: Diagnosis not present

## 2021-08-26 DIAGNOSIS — T402X5A Adverse effect of other opioids, initial encounter: Secondary | ICD-10-CM | POA: Diagnosis present

## 2021-08-26 DIAGNOSIS — I350 Nonrheumatic aortic (valve) stenosis: Secondary | ICD-10-CM | POA: Diagnosis present

## 2021-08-26 DIAGNOSIS — R55 Syncope and collapse: Secondary | ICD-10-CM

## 2021-08-26 DIAGNOSIS — R778 Other specified abnormalities of plasma proteins: Secondary | ICD-10-CM | POA: Diagnosis not present

## 2021-08-26 DIAGNOSIS — Z6841 Body Mass Index (BMI) 40.0 and over, adult: Secondary | ICD-10-CM | POA: Diagnosis not present

## 2021-08-26 DIAGNOSIS — I248 Other forms of acute ischemic heart disease: Secondary | ICD-10-CM | POA: Diagnosis present

## 2021-08-26 DIAGNOSIS — J45901 Unspecified asthma with (acute) exacerbation: Secondary | ICD-10-CM | POA: Diagnosis present

## 2021-08-26 DIAGNOSIS — I4891 Unspecified atrial fibrillation: Secondary | ICD-10-CM | POA: Diagnosis present

## 2021-08-26 DIAGNOSIS — E872 Acidosis, unspecified: Secondary | ICD-10-CM | POA: Diagnosis present

## 2021-08-26 DIAGNOSIS — M0008 Staphylococcal arthritis, vertebrae: Secondary | ICD-10-CM | POA: Diagnosis present

## 2021-08-26 DIAGNOSIS — Z20822 Contact with and (suspected) exposure to covid-19: Secondary | ICD-10-CM | POA: Diagnosis present

## 2021-08-26 DIAGNOSIS — I10 Essential (primary) hypertension: Secondary | ICD-10-CM | POA: Diagnosis present

## 2021-08-26 DIAGNOSIS — Z800 Family history of malignant neoplasm of digestive organs: Secondary | ICD-10-CM | POA: Diagnosis not present

## 2021-08-26 DIAGNOSIS — I468 Cardiac arrest due to other underlying condition: Secondary | ICD-10-CM | POA: Diagnosis present

## 2021-08-26 DIAGNOSIS — G8929 Other chronic pain: Secondary | ICD-10-CM | POA: Diagnosis present

## 2021-08-26 DIAGNOSIS — K7581 Nonalcoholic steatohepatitis (NASH): Secondary | ICD-10-CM | POA: Diagnosis present

## 2021-08-26 DIAGNOSIS — E860 Dehydration: Secondary | ICD-10-CM | POA: Diagnosis present

## 2021-08-26 DIAGNOSIS — R9431 Abnormal electrocardiogram [ECG] [EKG]: Secondary | ICD-10-CM | POA: Diagnosis present

## 2021-08-26 DIAGNOSIS — D72829 Elevated white blood cell count, unspecified: Secondary | ICD-10-CM | POA: Diagnosis present

## 2021-08-26 DIAGNOSIS — E876 Hypokalemia: Secondary | ICD-10-CM | POA: Diagnosis present

## 2021-08-26 LAB — URINALYSIS, COMPLETE (UACMP) WITH MICROSCOPIC
Bilirubin Urine: NEGATIVE
Glucose, UA: >=500 mg/dL — AB
Hgb urine dipstick: NEGATIVE
Ketones, ur: NEGATIVE mg/dL
Leukocytes,Ua: NEGATIVE
Nitrite: NEGATIVE
Protein, ur: NEGATIVE mg/dL
Specific Gravity, Urine: 1.037 — ABNORMAL HIGH (ref 1.005–1.030)
pH: 5 (ref 5.0–8.0)

## 2021-08-26 LAB — LIPID PANEL
Cholesterol: 174 mg/dL (ref 0–200)
HDL: 62 mg/dL (ref >40)
LDL Cholesterol: 108 mg/dL — ABNORMAL HIGH (ref 0–99)
Total CHOL/HDL Ratio: 2.8 RATIO
Triglycerides: 21 mg/dL (ref <150)
VLDL: 4 mg/dL (ref 0–40)

## 2021-08-26 LAB — COMPREHENSIVE METABOLIC PANEL
ALT: 52 U/L — ABNORMAL HIGH (ref 0–44)
AST: 55 U/L — ABNORMAL HIGH (ref 15–41)
Albumin: 3.2 g/dL — ABNORMAL LOW (ref 3.5–5.0)
Alkaline Phosphatase: 83 U/L (ref 38–126)
Anion gap: 7 (ref 5–15)
BUN: 13 mg/dL (ref 6–20)
CO2: 26 mmol/L (ref 22–32)
Calcium: 8.7 mg/dL — ABNORMAL LOW (ref 8.9–10.3)
Chloride: 104 mmol/L (ref 98–111)
Creatinine, Ser: 0.48 mg/dL (ref 0.44–1.00)
GFR, Estimated: >60 mL/min (ref >60)
Glucose, Bld: 108 mg/dL — ABNORMAL HIGH (ref 70–99)
Potassium: 4.4 mmol/L (ref 3.5–5.1)
Sodium: 137 mmol/L (ref 135–145)
Total Bilirubin: 0.6 mg/dL (ref 0.3–1.2)
Total Protein: 6.2 g/dL — ABNORMAL LOW (ref 6.5–8.1)

## 2021-08-26 LAB — ECHOCARDIOGRAM COMPLETE
AR max vel: 0.81 cm2
AV Area VTI: 0.82 cm2
AV Area mean vel: 0.82 cm2
AV Mean grad: 33 mmHg
AV Peak grad: 58.1 mmHg
Ao pk vel: 3.81 m/s
Area-P 1/2: 4.8 cm2
Calc EF: 65.4 %
Height: 65 in
S' Lateral: 3.6 cm
Single Plane A2C EF: 63.6 %
Single Plane A4C EF: 67.2 %
Weight: 4289.27 oz

## 2021-08-26 LAB — CBC WITH DIFFERENTIAL/PLATELET
Abs Immature Granulocytes: 0.04 10*3/uL (ref 0.00–0.07)
Basophils Absolute: 0 10*3/uL (ref 0.0–0.1)
Basophils Relative: 0 %
Eosinophils Absolute: 0 10*3/uL (ref 0.0–0.5)
Eosinophils Relative: 0 %
HCT: 34.1 % — ABNORMAL LOW (ref 36.0–46.0)
Hemoglobin: 10.6 g/dL — ABNORMAL LOW (ref 12.0–15.0)
Immature Granulocytes: 0 %
Lymphocytes Relative: 9 %
Lymphs Abs: 1 10*3/uL (ref 0.7–4.0)
MCH: 30.2 pg (ref 26.0–34.0)
MCHC: 31.1 g/dL (ref 30.0–36.0)
MCV: 97.2 fL (ref 80.0–100.0)
Monocytes Absolute: 0.6 10*3/uL (ref 0.1–1.0)
Monocytes Relative: 6 %
Neutro Abs: 9 10*3/uL — ABNORMAL HIGH (ref 1.7–7.7)
Neutrophils Relative %: 85 %
Platelets: 208 10*3/uL (ref 150–400)
RBC: 3.51 MIL/uL — ABNORMAL LOW (ref 3.87–5.11)
RDW: 13.2 % (ref 11.5–15.5)
WBC: 10.6 10*3/uL — ABNORMAL HIGH (ref 4.0–10.5)
nRBC: 0 % (ref 0.0–0.2)

## 2021-08-26 LAB — BASIC METABOLIC PANEL
Anion gap: 5 (ref 5–15)
BUN: 14 mg/dL (ref 6–20)
CO2: 27 mmol/L (ref 22–32)
Calcium: 8.6 mg/dL — ABNORMAL LOW (ref 8.9–10.3)
Chloride: 104 mmol/L (ref 98–111)
Creatinine, Ser: 0.53 mg/dL (ref 0.44–1.00)
GFR, Estimated: >60 mL/min (ref >60)
Glucose, Bld: 109 mg/dL — ABNORMAL HIGH (ref 70–99)
Potassium: 4.3 mmol/L (ref 3.5–5.1)
Sodium: 136 mmol/L (ref 135–145)

## 2021-08-26 LAB — TROPONIN I (HIGH SENSITIVITY): Troponin I (High Sensitivity): 742 ng/L (ref <18)

## 2021-08-26 LAB — HEPATITIS PANEL, ACUTE
HCV Ab: NONREACTIVE
Hep A IgM: NONREACTIVE
Hep B C IgM: NONREACTIVE
Hepatitis B Surface Ag: NONREACTIVE

## 2021-08-26 LAB — BRAIN NATRIURETIC PEPTIDE: B Natriuretic Peptide: 100.5 pg/mL — ABNORMAL HIGH (ref 0.0–100.0)

## 2021-08-26 LAB — MAGNESIUM: Magnesium: 2.1 mg/dL (ref 1.7–2.4)

## 2021-08-26 LAB — LACTIC ACID, PLASMA: Lactic Acid, Venous: 0.7 mmol/L (ref 0.5–1.9)

## 2021-08-26 LAB — TSH
TSH: 0.535 u[IU]/mL (ref 0.350–4.500)
TSH: 0.375 u[IU]/mL (ref 0.350–4.500)

## 2021-08-26 LAB — I-STAT VENOUS BLOOD GAS, ED
Acid-Base Excess: 1 mmol/L (ref 0.0–2.0)
Bicarbonate: 29.4 mmol/L — ABNORMAL HIGH (ref 20.0–28.0)
Calcium, Ion: 1.21 mmol/L (ref 1.15–1.40)
HCT: 35 % — ABNORMAL LOW (ref 36.0–46.0)
Hemoglobin: 11.9 g/dL — ABNORMAL LOW (ref 12.0–15.0)
O2 Saturation: 81 %
Potassium: 4.6 mmol/L (ref 3.5–5.1)
Sodium: 140 mmol/L (ref 135–145)
TCO2: 31 mmol/L (ref 22–32)
pCO2, Ven: 62.2 mmHg — ABNORMAL HIGH (ref 44.0–60.0)
pH, Ven: 7.282 (ref 7.250–7.430)
pO2, Ven: 53 mmHg — ABNORMAL HIGH (ref 32.0–45.0)

## 2021-08-26 LAB — PROTIME-INR
INR: 1.1 (ref 0.8–1.2)
Prothrombin Time: 14 seconds (ref 11.4–15.2)

## 2021-08-26 LAB — PHOSPHORUS: Phosphorus: 4 mg/dL (ref 2.5–4.6)

## 2021-08-26 LAB — AMMONIA: Ammonia: 27 umol/L (ref 9–35)

## 2021-08-26 LAB — PROCALCITONIN: Procalcitonin: 0.24 ng/mL

## 2021-08-26 MED ORDER — SODIUM CHLORIDE 0.9 % IV SOLN
1000.0000 mL | INTRAVENOUS | Status: AC
  Administered 2021-08-26: 1000 mL via INTRAVENOUS

## 2021-08-26 MED ORDER — VANCOMYCIN HCL IN DEXTROSE 1-5 GM/200ML-% IV SOLN
1000.0000 mg | Freq: Two times a day (BID) | INTRAVENOUS | Status: DC
  Filled 2021-08-26: qty 200

## 2021-08-26 MED ORDER — PERFLUTREN LIPID MICROSPHERE
1.0000 mL | INTRAVENOUS | Status: AC | PRN
  Administered 2021-08-26: 2 mL via INTRAVENOUS
  Filled 2021-08-26: qty 10

## 2021-08-26 MED ORDER — ENOXAPARIN SODIUM 40 MG/0.4ML IJ SOSY
40.0000 mg | PREFILLED_SYRINGE | INTRAMUSCULAR | Status: DC
  Administered 2021-08-26 – 2021-08-27 (×2): 40 mg via SUBCUTANEOUS
  Filled 2021-08-26 (×2): qty 0.4

## 2021-08-26 MED ORDER — HYDROCODONE-ACETAMINOPHEN 5-325 MG PO TABS
1.0000 | ORAL_TABLET | Freq: Four times a day (QID) | ORAL | Status: DC | PRN
  Administered 2021-08-26 – 2021-08-28 (×5): 1 via ORAL
  Filled 2021-08-26 (×5): qty 1

## 2021-08-26 MED ORDER — NALOXONE HCL 0.4 MG/ML IJ SOLN: 0.4000 mg | INTRAMUSCULAR | Status: DC | PRN

## 2021-08-26 MED ORDER — METRONIDAZOLE 500 MG/100ML IV SOLN
500.0000 mg | Freq: Two times a day (BID) | INTRAVENOUS | Status: DC
  Administered 2021-08-26 (×2): 500 mg via INTRAVENOUS
  Filled 2021-08-26 (×2): qty 100

## 2021-08-26 MED ORDER — SODIUM CHLORIDE 0.9 % IV SOLN
2.0000 g | Freq: Once | INTRAVENOUS | Status: AC
  Administered 2021-08-26: 2 g via INTRAVENOUS
  Filled 2021-08-26: qty 2

## 2021-08-26 MED ORDER — VANCOMYCIN HCL 2000 MG/400ML IV SOLN
2000.0000 mg | Freq: Once | INTRAVENOUS | Status: AC
  Administered 2021-08-26: 2000 mg via INTRAVENOUS
  Filled 2021-08-26: qty 400

## 2021-08-26 MED ORDER — SODIUM CHLORIDE 0.9 % IV SOLN
1000.0000 mL | INTRAVENOUS | Status: DC
  Administered 2021-08-26: 1000 mL via INTRAVENOUS

## 2021-08-26 MED ORDER — SODIUM CHLORIDE 0.9 % IV SOLN
2.0000 g | Freq: Three times a day (TID) | INTRAVENOUS | Status: DC
  Administered 2021-08-26: 2 g via INTRAVENOUS
  Filled 2021-08-26 (×2): qty 2

## 2021-08-26 MED ORDER — SODIUM CHLORIDE 0.9 % IV BOLUS
500.0000 mL | Freq: Once | INTRAVENOUS | Status: AC
  Administered 2021-08-26: 500 mL via INTRAVENOUS

## 2021-08-26 MED ORDER — ONDANSETRON HCL 4 MG/2ML IJ SOLN
4.0000 mg | Freq: Four times a day (QID) | INTRAMUSCULAR | Status: DC | PRN
  Administered 2021-08-26: 4 mg via INTRAVENOUS
  Filled 2021-08-26: qty 2

## 2021-08-26 NOTE — Assessment & Plan Note (Addendum)
In the setting of recent PEA arrest Await UA but so far no sources of infection,

## 2021-08-26 NOTE — ED Notes (Signed)
ED Provider at bedside. 

## 2021-08-26 NOTE — Progress Notes (Signed)
Transported pt. From ed  RM 5 to RM28

## 2021-08-26 NOTE — ED Notes (Signed)
RN removed BIPAP. Pt O2 100%

## 2021-08-26 NOTE — ED Notes (Signed)
Provider at bedside

## 2021-08-26 NOTE — Progress Notes (Signed)
Pharmacy Antibiotic Note  Nicole Hines is a 61 y.o. female admitted on 08/25/2021 with sepsis.  Pharmacy has been consulted for vancomycin and cefepime dosing.  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  Plan: Vancomycin 209m x1 then 1007mIV Q12H. Goal AUC 400-550.  Expected AUC 540.  SCr used 0.8 (actual 0.6).  Cefepime 2g IV Q8H.  Height: 5' 5"  (165.1 cm) Weight: 121.6 kg (268 lb 1.3 oz) IBW/kg (Calculated) : 57  Temp (24hrs), Avg:97.5 F (36.4 C), Min:97.5 F (36.4 C), Max:97.5 F (36.4 C)  Recent Labs  Lab 08/25/21 1846 08/25/21 1900 08/25/21 2054  WBC 20.1*  --  15.0*  CREATININE 0.77 0.60  --   LATICACIDVEN 4.0*  --  1.2    Estimated Creatinine Clearance: 99 mL/min (by C-G formula based on SCr of 0.6 mg/dL).    No Known Allergies   Thank you for allowing pharmacy to be a part of this patients care.  VeWynona NeatPharmD, BCPS  08/26/2021 12:37 AM

## 2021-08-26 NOTE — Progress Notes (Signed)
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 In the emergency room she was noted to be hypokalemic, troponin was 112, head CT, COVID PCR were negative, CTA chest -no pulmonary embolism, noted atelectasis, CT abdomen noted constipation, ? cirrhosis, CT   Assessment & Plan:   PEA arrest  -Was found down poorly responsive and bradycardic, per EMS documentation, concern for PEA status post CPR X 8 minutes and 1 round of epi  -Mental status improved after Narcan, raises the possibility of polypharmacy, oversedation following narcotics and benzos as likely etiology  -Clinically do not suspect true ACS, troponins minimally elevated  -CTA chest negative for pulmonary embolism or acute pulmonary process  -Appreciate cardiology input, follow-up echocardiogram   Transient atrial fibrillation  -Following arrest, remains in sinus rhythm  -Appreciate cardiology input, no indication for anticoagulation at this time   History of moderate aortic stenosis -Follow-up repeat echo  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  Morbid obesity   DVT prophylaxis: On Lovenox Code Status: Full code Family Communication: Discussed patient in detail, no family at bedside Disposition Plan:  Status is: Observation  The patient will require care spanning > 2 midnights and should be moved to inpatient because: Further work-up for PEA arrest  Consultants:  Cardiology  Procedures:   Antimicrobials:    Subjective: -Feels okay, asking for pain meds, thinks that she may have had multiple meds on an empty stomach  Objective: Vitals:   08/26/21 0600 08/26/21 0730 08/26/21 0900 08/26/21 1000  BP: 102/63 91/67 105/68 105/66  Pulse: 81 74 84 82  Resp: 11 18 19 20   Temp:  97.8 F (36.6 C)    TempSrc:  Oral    SpO2: 100% 99% 97% 99%  Weight:      Height:        Intake/Output Summary (Last 24 hours) at 08/26/2021 1044 Last data filed at 08/26/2021 3016 Gross per 24 hour  Intake 3424.32 ml  Output 1200 ml  Net 2224.32 ml   Filed Weights   08/25/21 1833  Weight: 121.6 kg    Examination:  General exam: Obese chronically ill pleasant female sitting up in bed, AAOx3, no distress HEENT: No JVD CVS: S1-S2, regular rate rhythm Lungs: Decreased breath sounds to bases otherwise clear Abdomen: Soft, nontender, bowel sounds present Extremities: No edema Skin: No rash on exposed skin Psych: Appropriate mood and affect Neuro: Moves all extremities, no localizing signs  Data Reviewed:   CBC: Recent Labs  Lab 08/25/21 1846 08/25/21 1900 08/25/21 2054 08/26/21 0117 08/26/21 0344  WBC 20.1*  --  15.0*  --  10.6*  NEUTROABS  --   --  12.8*  --  9.0*  HGB 12.9 13.6 11.8* 11.9* 10.6*  HCT 40.3 40.0 37.2 35.0* 34.1*  MCV 95.5  --  94.7  --  97.2  PLT 259  --  232  --  275   Basic Metabolic Panel: Recent Labs  Lab 08/25/21 1846  08/25/21 1900 08/25/21 2054 08/26/21 0102 08/26/21 0117 08/26/21 0344  NA 138 142  --  136 140 137  K 2.9* 2.9*  --  4.3 4.6 4.4  CL 103 103  --  104  --  104  CO2 27  --   --  27  --  26  GLUCOSE 134* 135*  --  109*  --  108*  BUN 15 18  --  14  --  13  CREATININE 0.77 0.60  --  0.53  --  0.48  CALCIUM 9.8  --   --  8.6*  --  8.7*  MG 2.5*  --  2.2  --   --  2.1  PHOS  --   --  3.1  --   --  4.0   GFR: Estimated Creatinine Clearance: 99 mL/min (by C-G formula based on SCr of 0.48 mg/dL). Liver Function Tests: Recent Labs  Lab 08/25/21 2054 08/26/21 0344  AST 72* 55*  ALT 63* 52*  ALKPHOS 101 83  BILITOT 0.4 0.6  PROT 7.1 6.2*  ALBUMIN 3.5 3.2*   No results for input(s): LIPASE, AMYLASE in the last 168 hours. Recent Labs  Lab 08/26/21 0102  AMMONIA 27   Coagulation Profile: Recent Labs  Lab 08/26/21 0102  INR 1.1   Cardiac Enzymes: Recent Labs  Lab 08/25/21 2054  CKTOTAL 175   BNP (last 3 results) No results for input(s): PROBNP in the last 8760 hours. HbA1C: No results for input(s): HGBA1C in the last 72 hours. CBG: Recent Labs  Lab 08/25/21 1849  GLUCAP 129*   Lipid Profile: Recent Labs    08/26/21 0344  CHOL 174  HDL 62  LDLCALC 108*  TRIG 21  CHOLHDL 2.8   Thyroid Function Tests: Recent Labs    08/26/21 0344  TSH 0.375   Anemia Panel: No results for input(s): VITAMINB12, FOLATE, FERRITIN, TIBC, IRON, RETICCTPCT in the last 72 hours. Urine analysis:    Component Value Date/Time   COLORURINE YELLOW 08/26/2021 0253   APPEARANCEUR HAZY (A) 08/26/2021 0253   LABSPEC 1.037 (H) 08/26/2021 0253   PHURINE 5.0 08/26/2021 0253   GLUCOSEU >=500 (A) 08/26/2021 0253   HGBUR NEGATIVE 08/26/2021 0253   BILIRUBINUR NEGATIVE 08/26/2021 0253   KETONESUR NEGATIVE 08/26/2021 0253   PROTEINUR NEGATIVE 08/26/2021 0253   UROBILINOGEN 0.2 11/20/2013 1603   NITRITE NEGATIVE 08/26/2021 0253   LEUKOCYTESUR NEGATIVE 08/26/2021 0253   Sepsis  Labs: @LABRCNTIP (procalcitonin:4,lacticidven:4)  ) Recent Results (from the past 240 hour(s))  Resp Panel by RT-PCR (Flu A&B, Covid) Nasopharyngeal Swab     Status: None   Collection Time: 08/25/21  6:22 PM   Specimen: Nasopharyngeal Swab; Nasopharyngeal(NP) swabs in vial transport medium  Result Value Ref Range Status   SARS Coronavirus 2 by RT PCR NEGATIVE NEGATIVE Final    Comment: (NOTE) SARS-CoV-2 target nucleic acids are NOT DETECTED.  The SARS-CoV-2 RNA is generally detectable in upper respiratory specimens during the acute phase of infection. The lowest concentration of SARS-CoV-2 viral copies this assay can detect is 138 copies/mL. A negative result  does not preclude SARS-Cov-2 infection and should not be used as the sole basis for treatment or other patient management decisions. A negative result may occur with  improper specimen collection/handling, submission of specimen other than nasopharyngeal swab, presence of viral mutation(s) within the areas targeted by this assay, and inadequate number of viral copies(<138 copies/mL). A negative result must be combined with clinical observations, patient history, and epidemiological information. The expected result is Negative.  Fact Sheet for Patients:  EntrepreneurPulse.com.au  Fact Sheet for Healthcare Providers:  IncredibleEmployment.be  This test is no t yet approved or cleared by the [place] FDA and  has been authorized for detection and/or diagnosis of SARS-CoV-2 by FDA under an Emergency Use Authorization (EUA). This EUA will remain  in effect (meaning this test can be used) for the duration of the COVID-19 declaration under Section 564(b)(1) of the Act, 21 U.S.C.section 360bbb-3(b)(1), unless the authorization is terminated  or revoked sooner.       Influenza A by PCR NEGATIVE NEGATIVE Final   Influenza B by PCR NEGATIVE NEGATIVE Final    Comment: (NOTE) The Xpert  Xpress SARS-CoV-2/FLU/RSV plus assay is intended as an aid in the diagnosis of influenza from Nasopharyngeal swab specimens and should not be used as a sole basis for treatment. Nasal washings and aspirates are unacceptable for Xpert Xpress SARS-CoV-2/FLU/RSV testing.  Fact Sheet for Patients: EntrepreneurPulse.com.au  Fact Sheet for Healthcare Providers: IncredibleEmployment.be  This test is not yet approved or cleared by the [place] FDA and has been authorized for detection and/or diagnosis of SARS-CoV-2 by FDA under an Emergency Use Authorization (EUA). This EUA will remain in effect (meaning this test can be used) for the duration of the COVID-19 declaration under Section 564(b)(1) of the Act, 21 U.S.C. section 360bbb-3(b)(1), unless the authorization is terminated or revoked.  Performed at [facility], [place] [address] [place], [place] [address]   Blood culture (routine x 2)     Status: None (Preliminary result)   Collection Time: 08/25/21  8:54 PM   Specimen: BLOOD  Result Value Ref Range Status   Specimen Description BLOOD RIGHT ANTECUBITAL  Final   Special Requests   Final    BOTTLES DRAWN AEROBIC AND ANAEROBIC Blood Culture adequate volume   Culture   Final    NO GROWTH < 12 HOURS Performed at [facility], [place] [address], [place], [place] [address]    Report Status PENDING  Incomplete  Blood culture (routine x 2)     Status: None (Preliminary result)   Collection Time: 08/25/21  8:54 PM   Specimen: BLOOD  Result Value Ref Range Status   Specimen Description BLOOD RIGHT ANTECUBITAL  Final   Special Requests   Final    BOTTLES DRAWN AEROBIC AND ANAEROBIC Blood Culture adequate volume   Culture   Final    NO GROWTH < 12 HOURS Performed at [facility], [place] [address] [place], [place] [address]    Report Status PENDING  Incomplete         Radiology Studies: CT Head Wo Contrast  Result Date:  08/25/2021 CLINICAL DATA:  Mental status change, unknown cause EXAM: CT HEAD WITHOUT CONTRAST TECHNIQUE: Contiguous axial images were obtained from the base of the skull through the vertex without intravenous contrast. COMPARISON:  03/29/2006. FINDINGS: Brain: No evidence of acute infarction, hemorrhage, cerebral edema, mass, mass effect, or midline shift. No hydrocephalus or extra-axial fluid collection. Vascular: No hyperdense vessel. Skull: Normal. Negative  for fracture or focal lesion. Sinuses/Orbits: Mucous retention cyst in the left frontal sinus. Mild mucosal thickening in the ethmoid air cells. The orbits are unremarkable. Other: The mastoid air cells are well aerated. IMPRESSION: IMPRESSION No acute intracranial process. Electronically Signed   By: Merilyn Baba M.D.   On: 08/25/2021 20:00   DG CHEST PORT 1 VIEW  Result Date: 08/26/2021 CLINICAL DATA:  Respiratory failure with hypoxia. EXAM: PORTABLE CHEST 1 VIEW COMPARISON:  08/25/2021. FINDINGS: The heart is enlarged and the mediastinal contour stable. The pulmonary vasculature is mildly distended. Minimal atelectasis is noted at the lung bases. No consolidation, effusion, or pneumothorax. No acute osseous abnormality. IMPRESSION: 1. Cardiomegaly with mildly distended pulmonary vasculature. 2. Mild atelectasis at the lung bases. Electronically Signed   By: Brett Fairy M.D.   On: 08/26/2021 02:22   DG Chest Portable 1 View  Result Date: 08/25/2021 CLINICAL DATA:  CPR. EXAM: PORTABLE CHEST 1 VIEW COMPARISON:  Chest radiograph 01/17/2021 FINDINGS: Chronic cardiomegaly. Unchanged mediastinal contours allowing for differences in technique. No pneumothorax or pulmonary edema. Bibasilar atelectasis, left greater than right. No pleural effusion. On limited evaluation, no acute osseous abnormalities are seen. IMPRESSION: Chronic cardiomegaly. Bibasilar atelectasis. Electronically Signed   By: Keith Rake M.D.   On: 08/25/2021 18:47   CT Angio  Chest/Abd/Pel for Dissection W and/or Wo Contrast  Result Date: 08/25/2021 CLINICAL DATA:  Aortic aneurysm, known or suspected Post CPR. EXAM: CT ANGIOGRAPHY CHEST, ABDOMEN AND PELVIS TECHNIQUE: Non-contrast CT of the chest was initially obtained. Multidetector CT imaging through the chest, abdomen and pelvis was performed using the standard protocol during bolus administration of intravenous contrast. Multiplanar reconstructed images and MIPs were obtained and reviewed to evaluate the vascular anatomy. CONTRAST:  1100m OMNIPAQUE IOHEXOL 350 MG/ML SOLN COMPARISON:  Chest radiograph earlier today. Abdominopelvic CT 12/18/2020. No prior chest CT available. FINDINGS: CTA CHEST FINDINGS Cardiovascular: No aortic hematoma noncontrast exam. The thoracic aorta is tortuous but normal in caliber. No aortic aneurysm or dissection. Conventional branching pattern from the aortic arch exam not tailored for evaluation of pulmonary arteries. No obvious central pulmonary embolus. The heart is upper normal in size. Trace pericardial effusion. Subcentimeter lobulated low-density adjacent to the descending aorta in the lower chest was present on prior exam, may represent lymphatic malformation or dilatation of the as azygous/semi azygous system. Mediastinum/Nodes: No enlarged mediastinal, hilar, or axillary lymph nodes. Patulous distal esophagus with small hiatal hernia. No thyroid nodule. Lungs/Pleura: No pneumothorax. Minimal retained mucus within the trachea and central bronchi. Minor subsegmental atelectasis in the right greater than left lower lobe. No pleural effusion or findings of pulmonary edema. Musculoskeletal: No definite rib fracture, mild motion artifact limits detailed rib assessment. No sternal fracture. Diffuse thoracic spondylosis with multilevel endplate spurring. No confluent chest wall contusion. Review of the MIP images confirms the above findings. CTA ABDOMEN AND PELVIS FINDINGS VASCULAR Aorta: Normal caliber  abdominal aorta. No aneurysm. No dissection or acute findings. Celiac: Patent without evidence of aneurysm, dissection, vasculitis or significant stenosis. SMA: Patent without evidence of aneurysm, dissection, vasculitis or significant stenosis. Renals: Both renal arteries are patent without evidence of aneurysm, dissection, vasculitis, fibromuscular dysplasia or significant stenosis. IMA: Patent. Inflow: Patent without evidence of aneurysm, dissection, vasculitis or significant stenosis. Veins: No obvious venous abnormality within the limitations of this arterial phase study. Review of the MIP images confirms the above findings. NON-VASCULAR Hepatobiliary: Suggestion of decreased hepatic density and micro nodular hepatic contours. No focal hepatic lesion on this arterial  phase exam. Cholecystectomy with surgical clips in the gallbladder fossa. Probable dropped clips adjacent to the inferior liver edge. Common bile duct is not well-defined, no evidence of biliary dilatation. Pancreas: Fatty atrophy.  No ductal dilatation or inflammation. Spleen: Normal in size and arterial enhancement. Adrenals/Urinary Tract: No adrenal nodule. Question of heterogeneous bilateral renal enhancement versus artifact from arms down positioning and habitus. No hydronephrosis or focal renal abnormality. Distended urinary bladder without wall thickening. No visualized urolithiasis. Stomach/Bowel: Small hiatal hernia. No bowel obstruction or inflammation. Moderate stool burden with moderate stool distending the rectum. Normal appendix. Lymphatic: Scattered small retroperitoneal lymph nodes are not enlarged by size criteria. No enlarged lymph nodes in the abdomen or pelvis. Reproductive: Quiescent uterus, deviating into the right pelvis. No adnexal mass. Other: No free air or ascites.  No abdominal wall hernia. Musculoskeletal: Multilevel degenerative change in the spine. Sclerosis about the left sacrum adjacent to the sacroiliac joint with  joint space widening, likely sequela of prior sacroiliitis. Review of the MIP images confirms the above findings. IMPRESSION: 1. No aortic dissection or acute aortic abnormality. No aneurysm of the thoracoabdominal aorta. 2. Question of heterogeneous bilateral renal enhancement versus artifact from arms down positioning and habitus. Recommend correlation with urinalysis. 3. Patulous distal esophagus with small hiatal hernia. 4. Suggestion of decreased hepatic density and micro nodular hepatic contours, suggesting cirrhosis. Recommend correlation with cirrhosis risk factors 5. Moderate stool burden with moderate stool distending the rectum, suggesting constipation. Aortic Atherosclerosis (ICD10-I70.0). Electronically Signed   By: Keith Rake M.D.   On: 08/25/2021 20:13        Scheduled Meds:  polyethylene glycol  17 g Oral BID   Continuous Infusions:  sodium chloride 1,000 mL (08/26/21 0912)   ceFEPime (MAXIPIME) IV Stopped (08/26/21 0951)   metronidazole 500 mg (08/26/21 0951)   vancomycin       LOS: 0 days    Time spent: 42mn    PDomenic Polite MD [facility]   08/26/2021, 10:44 AM

## 2021-08-26 NOTE — Assessment & Plan Note (Signed)
-  SIRS criteria met with elevated white blood cell count,       Component Value Date/Time   WBC 15.0 (H) 08/25/2021 2054   LYMPHSABS 0.7 08/25/2021 2054    tachycardia   ,  Today's Vitals   08/25/21 2300 08/25/21 2315 08/25/21 2330 08/25/21 2345  BP: 100/66 100/65 (!) 83/42 98/71  Pulse: 89 81 82 91  Resp: _0 Temp:      TempSrc:      SpO2: 99% 93% 93% 96%  Weight:      Height:      PainSc:       Body mass index is 44.61 kg/m.  This patient meets SIRS Criteria and may be septic. SIRS = Systemic Inflammatory Response Syndrome  The recent clinical data is shown below. Vitals:   08/25/21 2300 08/25/21 2315 08/25/21 2330 08/25/21 2345  BP: 100/66 100/65 (!) 83/42 98/71  Pulse: 89 81 82 91  Resp: _1 Temp:      TempSrc:      SpO2: 99% 93% 93% 96%  Weight:      Height:             Cellulitis, soft tissue infection, viral,  Source of sepsis is unknown but given clinical picture will continue to treat   Patient meeting criteria for Severe sepsis with    evidence of end organ damage/organ dysfunction such as    Lab Results  Component Value Date   CREATININE 0.60 08/25/2021   CREATININE 0.77 08/25/2021    elevated lactic acid >2    acute metabolic encephalopathy  ITU<42 mmhg or MAP < 65 mmhg,   Acute hypoxia requiring new supplemental oxygen, SpO2: 96 % O2 Flow Rate (L/min): 3 L/min     - Obtain serial lactic acid and procalcitonin level.  - Initiated IV antibiotics in ER: Antibiotics Given (last 72 hours)    Date/Time Action Medication Dose Rate   08/25/21 2100 New Bag/Given   cefTRIAXone (ROCEPHIN) 1 g in sodium chloride 0.9 % 100 mL IVPB 1 g 200 mL/hr      Will continue  on :broad spectrum vanc, cefepime, falgyl   - await results of blood and urine culture   Intravenous fluids were administered     12:37 AM

## 2021-08-26 NOTE — Progress Notes (Signed)
Progress Note  Patient Name: Nicole Hines Date of Encounter: 08/26/2021  [facility] Cardiologist: None   Subjective   Chest wall is sore to touch post CPR  Inpatient Medications    Scheduled Meds:  polyethylene glycol  17 g Oral BID   Continuous Infusions:  sodium chloride     ceFEPime (MAXIPIME) IV     metronidazole Stopped (08/26/21 0508)   vancomycin     PRN Meds: acetaminophen **OR** acetaminophen, albuterol, HYDROcodone-acetaminophen, naLOXone (NARCAN)  injection   Vital Signs    Vitals:   08/26/21 0530 08/26/21 0545 08/26/21 0600 08/26/21 0730  BP: 93/77 102/61 102/63 91/67  Pulse: 77 74 81 74  Resp: 18 13 11 18   Temp:    97.8 F (36.6 C)  TempSrc:    Oral  SpO2: 100% 100% 100% 99%  Weight:      Height:        Intake/Output Summary (Last 24 hours) at 08/26/2021 0853 Last data filed at 08/26/2021 0744 Gross per 24 hour  Intake 2207.71 ml  Output 1200 ml  Net 1007.71 ml   Last 3 Weights 08/25/2021 07/04/2021 05/16/2021  Weight (lbs) 268 lb 1.3 oz 268 lb 251 lb  Weight (kg) 121.6 kg 121.564 kg 113.853 kg      Telemetry    sinus - Personally Reviewed  ECG    No AM EKG - Personally Reviewed  Physical Exam   GEN: No acute distress.   Neck: No JVD Cardiac: RRR, no murmurs, rubs, or gallops.  Respiratory: Clear to auscultation bilaterally. GI: Soft, nontender, non-distended  MS: No edema; No deformity. Neuro:  Nonfocal  Psych: Normal affect   Labs    High Sensitivity Troponin:   Recent Labs  Lab 08/25/21 1846 08/25/21 2054 08/26/21 0102  TROPONINIHS 112* 371*   359* 742*     Chemistry Recent Labs  Lab 08/25/21 1846 08/25/21 1900 08/25/21 2054 08/26/21 0102 08/26/21 0117 08/26/21 0344  NA 138 142  --  136 140 137  K 2.9* 2.9*  --  4.3 4.6 4.4  CL 103 103  --  104  --  104  CO2 27  --   --  27  --  26  GLUCOSE 134* 135*  --  109*  --  108*  BUN 15 18  --  14  --  13  CREATININE 0.77 0.60  --  0.53  --  0.48  CALCIUM 9.8   --   --  8.6*  --  8.7*  MG 2.5*  --  2.2  --   --  2.1  PROT  --   --  7.1  --   --  6.2*  ALBUMIN  --   --  3.5  --   --  3.2*  AST  --   --  72*  --   --  55*  ALT  --   --  63*  --   --  52*  ALKPHOS  --   --  101  --   --  83  BILITOT  --   --  0.4  --   --  0.6  GFRNONAA >60  --   --  >60  --  >60  ANIONGAP 8  --   --  5  --  7    Lipids  Recent Labs  Lab 08/26/21 0344  CHOL 174  TRIG 21  HDL 62  LDLCALC 108*  CHOLHDL 2.8    Hematology  Recent Labs  Lab 08/25/21 1846 08/25/21 1900 08/25/21 2054 08/26/21 0117 08/26/21 0344  WBC 20.1*  --  15.0*  --  10.6*  RBC 4.22  --  3.93  --  3.51*  HGB 12.9   < > 11.8* 11.9* 10.6*  HCT 40.3   < > 37.2 35.0* 34.1*  MCV 95.5  --  94.7  --  97.2  MCH 30.6  --  30.0  --  30.2  MCHC 32.0  --  31.7  --  31.1  RDW 13.0  --  13.1  --  13.2  PLT 259  --  232  --  208   < > = values in this interval not displayed.   Thyroid  Recent Labs  Lab 08/26/21 0344  TSH 0.375    BNP Recent Labs  Lab 08/26/21 0102  BNP 100.5*    DDimer  Recent Labs  Lab 08/25/21 2054  DDIMER 5.82*     Radiology    CT Head Wo Contrast  Result Date: 08/25/2021 CLINICAL DATA:  Mental status change, unknown cause EXAM: CT HEAD WITHOUT CONTRAST TECHNIQUE: Contiguous axial images were obtained from the base of the skull through the vertex without intravenous contrast. COMPARISON:  03/29/2006. FINDINGS: Brain: No evidence of acute infarction, hemorrhage, cerebral edema, mass, mass effect, or midline shift. No hydrocephalus or extra-axial fluid collection. Vascular: No hyperdense vessel. Skull: Normal. Negative for fracture or focal lesion. Sinuses/Orbits: Mucous retention cyst in the left frontal sinus. Mild mucosal thickening in the ethmoid air cells. The orbits are unremarkable. Other: The mastoid air cells are well aerated. IMPRESSION: IMPRESSION No acute intracranial process. Electronically Signed   By: Merilyn Baba M.D.   On: 08/25/2021 20:00   DG  CHEST PORT 1 VIEW  Result Date: 08/26/2021 CLINICAL DATA:  Respiratory failure with hypoxia. EXAM: PORTABLE CHEST 1 VIEW COMPARISON:  08/25/2021. FINDINGS: The heart is enlarged and the mediastinal contour stable. The pulmonary vasculature is mildly distended. Minimal atelectasis is noted at the lung bases. No consolidation, effusion, or pneumothorax. No acute osseous abnormality. IMPRESSION: 1. Cardiomegaly with mildly distended pulmonary vasculature. 2. Mild atelectasis at the lung bases. Electronically Signed   By: Brett Fairy M.D.   On: 08/26/2021 02:22   DG Chest Portable 1 View  Result Date: 08/25/2021 CLINICAL DATA:  CPR. EXAM: PORTABLE CHEST 1 VIEW COMPARISON:  Chest radiograph 01/17/2021 FINDINGS: Chronic cardiomegaly. Unchanged mediastinal contours allowing for differences in technique. No pneumothorax or pulmonary edema. Bibasilar atelectasis, left greater than right. No pleural effusion. On limited evaluation, no acute osseous abnormalities are seen. IMPRESSION: Chronic cardiomegaly. Bibasilar atelectasis. Electronically Signed   By: Keith Rake M.D.   On: 08/25/2021 18:47   CT Angio Chest/Abd/Pel for Dissection W and/or Wo Contrast  Result Date: 08/25/2021 CLINICAL DATA:  Aortic aneurysm, known or suspected Post CPR. EXAM: CT ANGIOGRAPHY CHEST, ABDOMEN AND PELVIS TECHNIQUE: Non-contrast CT of the chest was initially obtained. Multidetector CT imaging through the chest, abdomen and pelvis was performed using the standard protocol during bolus administration of intravenous contrast. Multiplanar reconstructed images and MIPs were obtained and reviewed to evaluate the vascular anatomy. CONTRAST:  165m OMNIPAQUE IOHEXOL 350 MG/ML SOLN COMPARISON:  Chest radiograph earlier today. Abdominopelvic CT 12/18/2020. No prior chest CT available. FINDINGS: CTA CHEST FINDINGS Cardiovascular: No aortic hematoma noncontrast exam. The thoracic aorta is tortuous but normal in caliber. No aortic aneurysm or  dissection. Conventional branching pattern from the aortic arch exam not tailored for evaluation of pulmonary arteries.  No obvious central pulmonary embolus. The heart is upper normal in size. Trace pericardial effusion. Subcentimeter lobulated low-density adjacent to the descending aorta in the lower chest was present on prior exam, may represent lymphatic malformation or dilatation of the as azygous/semi azygous system. Mediastinum/Nodes: No enlarged mediastinal, hilar, or axillary lymph nodes. Patulous distal esophagus with small hiatal hernia. No thyroid nodule. Lungs/Pleura: No pneumothorax. Minimal retained mucus within the trachea and central bronchi. Minor subsegmental atelectasis in the right greater than left lower lobe. No pleural effusion or findings of pulmonary edema. Musculoskeletal: No definite rib fracture, mild motion artifact limits detailed rib assessment. No sternal fracture. Diffuse thoracic spondylosis with multilevel endplate spurring. No confluent chest wall contusion. Review of the MIP images confirms the above findings. CTA ABDOMEN AND PELVIS FINDINGS VASCULAR Aorta: Normal caliber abdominal aorta. No aneurysm. No dissection or acute findings. Celiac: Patent without evidence of aneurysm, dissection, vasculitis or significant stenosis. SMA: Patent without evidence of aneurysm, dissection, vasculitis or significant stenosis. Renals: Both renal arteries are patent without evidence of aneurysm, dissection, vasculitis, fibromuscular dysplasia or significant stenosis. IMA: Patent. Inflow: Patent without evidence of aneurysm, dissection, vasculitis or significant stenosis. Veins: No obvious venous abnormality within the limitations of this arterial phase study. Review of the MIP images confirms the above findings. NON-VASCULAR Hepatobiliary: Suggestion of decreased hepatic density and micro nodular hepatic contours. No focal hepatic lesion on this arterial phase exam. Cholecystectomy with  surgical clips in the gallbladder fossa. Probable dropped clips adjacent to the inferior liver edge. Common bile duct is not well-defined, no evidence of biliary dilatation. Pancreas: Fatty atrophy.  No ductal dilatation or inflammation. Spleen: Normal in size and arterial enhancement. Adrenals/Urinary Tract: No adrenal nodule. Question of heterogeneous bilateral renal enhancement versus artifact from arms down positioning and habitus. No hydronephrosis or focal renal abnormality. Distended urinary bladder without wall thickening. No visualized urolithiasis. Stomach/Bowel: Small hiatal hernia. No bowel obstruction or inflammation. Moderate stool burden with moderate stool distending the rectum. Normal appendix. Lymphatic: Scattered small retroperitoneal lymph nodes are not enlarged by size criteria. No enlarged lymph nodes in the abdomen or pelvis. Reproductive: Quiescent uterus, deviating into the right pelvis. No adnexal mass. Other: No free air or ascites.  No abdominal wall hernia. Musculoskeletal: Multilevel degenerative change in the spine. 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 Review of the MIP images confirms the above findings. IMPRESSION: 1. No aortic dissection or acute aortic abnormality. No aneurysm of the thoracoabdominal aorta. 2. Question of heterogeneous bilateral renal enhancement versus artifact from arms down positioning and habitus. Recommend correlation with urinalysis. 3. Patulous distal esophagus with small hiatal hernia. 4. Suggestion of decreased hepatic density and micro nodular hepatic contours, suggesting cirrhosis. Recommend correlation with cirrhosis risk factors 5. Moderate stool burden with moderate stool distending the rectum, suggesting constipation. Aortic Atherosclerosis (ICD10-I70.0). Electronically Signed   By: Keith Rake M.D.   On: 08/25/2021 20:13    Cardiac Studies      Patient Profile     61 y.o. female with history of moderate AS, HTN, morbid obesity and chronic pain on narcotics admitted following syncopal event, possible PEA arrest.   Assessment & Plan    Syncope/Possible PEA arrest: Pt was found down at home and was non-responsive and bradycardic. EMS notes suggest PEA on arrival with quick recovery with one dose of epinephrine and chest compressions. Suspicion for overdose of home narcotics leading to respiratory/cardiac arrest. Mild troponin  elevation following arrest is expected. I do not think this is true ACS. Will plan an echo today to assess LVEF.   Atrial fibrillation post arrest: sinus today. Will follow on telemetry. No indication for anti-coagulation at this time.   Moderate aortic stenosis: Known from echo in May 2022. Repeat echo today to assess   For questions or updates, please contact [place] Please consult [email or web address] for contact info under        Signed, Lauree Chandler, MD  08/26/2021, 8:53 AM

## 2021-08-26 NOTE — Assessment & Plan Note (Signed)
Cover event.  Given cardiac murmur obtain echogram

## 2021-08-26 NOTE — Progress Notes (Signed)
°  Transition of Care [facility]) Screening Note   Patient Details  Name: Nicole Hines Date of Birth: Mar 15, 1961   Transition of Care [facility]) CM/SW Contact:    Milas Gain, [place] Phone Number: 08/26/2021, 4:03 PM    Transition of Care Department [facility]) has reviewed patient and no TOC needs have been identified at this time. We will continue to monitor patient advancement through interdisciplinary progression rounds. If new patient transition needs arise, please place a TOC consult.

## 2021-08-26 NOTE — Progress Notes (Signed)
°   08/26/21 0218  BiPAP/CPAP/SIPAP  $ Non-Invasive Ventilator  Non-Invasive Vent Set Up  $ Non-Invasive Home Ventilator  Initial  BiPAP/CPAP/SIPAP Pt Type Adult  Mask Type Full face mask  Mask Size Medium  Set Rate 10 breaths/min  Respiratory Rate 15 breaths/min  IPAP 12 cmH20  EPAP 5 cmH2O  Oxygen Percent 40 %  Minute Ventilation 6.5  Leak 36  Peak Inspiratory Pressure (PIP) 12  Tidal Volume (Vt) 425  BiPAP/CPAP/SIPAP BiPAP  Patient Home Equipment No  Auto Titrate Yes  Press High Alarm 25 cmH2O  Press Low Alarm 5 cmH2O  BiPAP/CPAP /SiPAP Vitals  Pulse Rate 79  Resp 11  BP 99/70  SpO2 100 %  MEWS Score/Color  MEWS Score 2  MEWS Score Color Yellow   Placed pt. On above bipap settings per md order .

## 2021-08-26 NOTE — Progress Notes (Signed)
Bilateral lower extremity venous duplex completed. Refer to "CV Proc" under chart review to view preliminary results.  08/26/2021 1:28 PM Kelby Aline., MHA, RVT, RDCS, RDMS

## 2021-08-26 NOTE — Assessment & Plan Note (Signed)
-   will monitor on tele avoid QT prolonging medications, rehydrate correct electrolytes

## 2021-08-26 NOTE — Assessment & Plan Note (Signed)
-   most likely multifactorial secondary to combination of  Possible infection dehydration secondary to decreased by mouth intake,   polypharmacy   - Will rehydrate   - treat underlining infection   - Hold contributing medications   - if no improvement may need further imaging to evaluate for CNS pathology pathology such as MRI of the brain   - neurological exam appears to be nonfocal but patient unable to cooperate fully   - VBG ordered    - ammonia ordered

## 2021-08-26 NOTE — ED Notes (Signed)
Returned from vascular

## 2021-08-26 NOTE — Assessment & Plan Note (Signed)
Hold off on narcotics for tonight hold off on sedating medications for tonight

## 2021-08-26 NOTE — Assessment & Plan Note (Signed)
Down time unknown patient was found agonal CPR for 6 minutes epi x4 Including initial rhythm was bradycardia PEA narrow QRS.  After ROSC recent A. fib with RVR and converted to sinus tach Mental status improved somewhat with Narcan Cardiology aware appreciate their consult  echogram cardiac cycle cardiac enzymes correct electrolytes

## 2021-08-27 DIAGNOSIS — R55 Syncope and collapse: Secondary | ICD-10-CM | POA: Diagnosis not present

## 2021-08-27 DIAGNOSIS — R778 Other specified abnormalities of plasma proteins: Secondary | ICD-10-CM

## 2021-08-27 DIAGNOSIS — R7989 Other specified abnormal findings of blood chemistry: Secondary | ICD-10-CM

## 2021-08-27 DIAGNOSIS — I4891 Unspecified atrial fibrillation: Secondary | ICD-10-CM | POA: Diagnosis not present

## 2021-08-27 DIAGNOSIS — I350 Nonrheumatic aortic (valve) stenosis: Secondary | ICD-10-CM | POA: Diagnosis not present

## 2021-08-27 LAB — CBC
HCT: 36 % (ref 36.0–46.0)
Hemoglobin: 11.3 g/dL — ABNORMAL LOW (ref 12.0–15.0)
MCH: 29.8 pg (ref 26.0–34.0)
MCHC: 31.4 g/dL (ref 30.0–36.0)
MCV: 95 fL (ref 80.0–100.0)
Platelets: 219 10*3/uL (ref 150–400)
RBC: 3.79 MIL/uL — ABNORMAL LOW (ref 3.87–5.11)
RDW: 13 % (ref 11.5–15.5)
WBC: 8.5 10*3/uL (ref 4.0–10.5)
nRBC: 0 % (ref 0.0–0.2)

## 2021-08-27 LAB — BASIC METABOLIC PANEL
Anion gap: 7 (ref 5–15)
BUN: 9 mg/dL (ref 6–20)
CO2: 27 mmol/L (ref 22–32)
Calcium: 9.1 mg/dL (ref 8.9–10.3)
Chloride: 106 mmol/L (ref 98–111)
Creatinine, Ser: 0.5 mg/dL (ref 0.44–1.00)
GFR, Estimated: >60 mL/min (ref >60)
Glucose, Bld: 97 mg/dL (ref 70–99)
Potassium: 3.5 mmol/L (ref 3.5–5.1)
Sodium: 140 mmol/L (ref 135–145)

## 2021-08-27 MED ORDER — CHLORHEXIDINE GLUCONATE CLOTH 2 % EX PADS
6.0000 | MEDICATED_PAD | Freq: Every day | CUTANEOUS | Status: DC
Start: 2021-08-27 — End: 2021-08-28
  Administered 2021-08-27: 6 via TOPICAL

## 2021-08-27 MED ORDER — DOXYCYCLINE HYCLATE 100 MG PO TABS
100.0000 mg | ORAL_TABLET | Freq: Two times a day (BID) | ORAL | Status: DC
  Administered 2021-08-27 – 2021-08-28 (×3): 100 mg via ORAL
  Filled 2021-08-27 (×3): qty 1

## 2021-08-27 NOTE — Progress Notes (Signed)
Pt refused BiPAP for the evening. RT will cont to monitor.

## 2021-08-27 NOTE — Progress Notes (Signed)
Progress Note  Patient Name: Nicole Hines Date of Encounter: 08/27/2021  [facility] Cardiologist: None   Subjective   SOme chst wall pain  No SOB  No palpitations   Patient says she was doing laundry at time of event    Had not eaten much, wasn't hungry.  Can't remember how much drank Has busy job   Works 6 days week   Active at work      No change in medicine dosing    Does admit to taking at least 4 pain meds per day  Inpatient Medications    Scheduled Meds:  Chlorhexidine Gluconate Cloth  6 each Topical Daily   enoxaparin (LOVENOX) injection  40 mg Subcutaneous Q24H   polyethylene glycol  17 g Oral BID   Continuous Infusions:   PRN Meds: acetaminophen **OR** acetaminophen, albuterol, HYDROcodone-acetaminophen, naLOXone (NARCAN)  injection, ondansetron (ZOFRAN) IV   Vital Signs    Vitals:   08/26/21 2049 08/26/21 2051 08/27/21 0012 08/27/21 0404  BP: 130/75 130/75 112/65 134/70  Pulse: 89 86 87 89  Resp: 18  18 18   Temp: 98.5 F (36.9 C)  98.5 F (36.9 C) 98.3 F (36.8 C)  TempSrc: Oral  Oral Oral  SpO2: 97% 97% 97% 98%  Weight:      Height:        Intake/Output Summary (Last 24 hours) at 08/27/2021 0752 Last data filed at 08/26/2021 1800 Gross per 24 hour  Intake 2053.1 ml  Output --  Net 2053.1 ml   Last 3 Weights 08/25/2021 07/04/2021 05/16/2021  Weight (lbs) 268 lb 1.3 oz 268 lb 251 lb  Weight (kg) 121.6 kg 121.564 kg 113.853 kg      Telemetry    SR  - Personally Reviewed  ECG    No new- Personally Reviewed  Physical Exam   GEN: No acute distress.   Neck: No JVD Cardiac: RRR  GR III/VI sys murmur LSB/Base   Respiratory: Clear to auscultation bilaterally. GI: Soft, nontender, non-distended  MS: No edema; No deformity. Neuro:  Nonfocal  Psych: Normal affect   Labs    High Sensitivity Troponin:   Recent Labs  Lab 08/25/21 1846 08/25/21 2054 08/26/21 0102  TROPONINIHS 112* 371*   359* 742*     Chemistry Recent Labs  Lab  08/25/21 1846 08/25/21 1900 08/25/21 2054 08/26/21 0102 08/26/21 0117 08/26/21 0344 08/27/21 0213  NA 138   < >  --  136 140 137 140  K 2.9*   < >  --  4.3 4.6 4.4 3.5  CL 103   < >  --  104  --  104 106  CO2 27  --   --  27  --  26 27  GLUCOSE 134*   < >  --  109*  --  108* 97  BUN 15   < >  --  14  --  13 9  CREATININE 0.77   < >  --  0.53  --  0.48 0.50  CALCIUM 9.8  --   --  8.6*  --  8.7* 9.1  MG 2.5*  --  2.2  --   --  2.1  --   PROT  --   --  7.1  --   --  6.2*  --   ALBUMIN  --   --  3.5  --   --  3.2*  --   AST  --   --  72*  --   --  55*  --   ALT  --   --  63*  --   --  52*  --   ALKPHOS  --   --  101  --   --  83  --   BILITOT  --   --  0.4  --   --  0.6  --   GFRNONAA >60  --   --  >60  --  >60 >60  ANIONGAP 8  --   --  5  --  7 7   < > = values in this interval not displayed.    Lipids  Recent Labs  Lab 08/26/21 0344  CHOL 174  TRIG 21  HDL 62  LDLCALC 108*  CHOLHDL 2.8    Hematology Recent Labs  Lab 08/25/21 2054 08/26/21 0117 08/26/21 0344 08/27/21 0213  WBC 15.0*  --  10.6* 8.5  RBC 3.93  --  3.51* 3.79*  HGB 11.8* 11.9* 10.6* 11.3*  HCT 37.2 35.0* 34.1* 36.0  MCV 94.7  --  97.2 95.0  MCH 30.0  --  30.2 29.8  MCHC 31.7  --  31.1 31.4  RDW 13.1  --  13.2 13.0  PLT 232  --  208 219   Thyroid  Recent Labs  Lab 08/26/21 0344  TSH 0.375    BNP Recent Labs  Lab 08/26/21 0102  BNP 100.5*    DDimer  Recent Labs  Lab 08/25/21 2054  DDIMER 5.82*     Radiology    CT Head Wo Contrast  Result Date: 08/25/2021 CLINICAL DATA:  Mental status change, unknown cause EXAM: CT HEAD WITHOUT CONTRAST TECHNIQUE: Contiguous axial images were obtained from the base of the skull through the vertex without intravenous contrast. COMPARISON:  03/29/2006. FINDINGS: Brain: No evidence of acute infarction, hemorrhage, cerebral edema, mass, mass effect, or midline shift. No hydrocephalus or extra-axial fluid collection. Vascular: No hyperdense vessel. Skull:  Normal. Negative for fracture or focal lesion. Sinuses/Orbits: Mucous retention cyst in the left frontal sinus. Mild mucosal thickening in the ethmoid air cells. The orbits are unremarkable. Other: The mastoid air cells are well aerated. IMPRESSION: IMPRESSION No acute intracranial process. Electronically Signed   By: Merilyn Baba M.D.   On: 08/25/2021 20:00   DG CHEST PORT 1 VIEW  Result Date: 08/26/2021 CLINICAL DATA:  Respiratory failure with hypoxia. EXAM: PORTABLE CHEST 1 VIEW COMPARISON:  08/25/2021. FINDINGS: The heart is enlarged and the mediastinal contour stable. The pulmonary vasculature is mildly distended. Minimal atelectasis is noted at the lung bases. No consolidation, effusion, or pneumothorax. No acute osseous abnormality. IMPRESSION: 1. Cardiomegaly with mildly distended pulmonary vasculature. 2. Mild atelectasis at the lung bases. Electronically Signed   By: Brett Fairy M.D.   On: 08/26/2021 02:22   DG Chest Portable 1 View  Result Date: 08/25/2021 CLINICAL DATA:  CPR. EXAM: PORTABLE CHEST 1 VIEW COMPARISON:  Chest radiograph 01/17/2021 FINDINGS: Chronic cardiomegaly. Unchanged mediastinal contours allowing for differences in technique. No pneumothorax or pulmonary edema. Bibasilar atelectasis, left greater than right. No pleural effusion. On limited evaluation, no acute osseous abnormalities are seen. IMPRESSION: Chronic cardiomegaly. Bibasilar atelectasis. Electronically Signed   By: Keith Rake M.D.   On: 08/25/2021 18:47   ECHOCARDIOGRAM COMPLETE  Result Date: 08/26/2021    ECHOCARDIOGRAM REPORT   Patient Name:   Nicole Hines [facility] Date of Exam: 08/26/2021 Medical Rec #:  [id number]        Height:       65.0 in Accession #:  [id number]       Weight:       268.1 lb Date of Birth:  [birth year]         BSA:          2.240 m Patient Age:    65 years         BP:           105/68 mmHg Patient Gender: F                HR:           79 bpm. Exam Location:  Inpatient Procedure: 2D Echo,  Cardiac Doppler, Color Doppler and Intracardiac            Opacification Agent Indications:    Syncope  History:        Patient has prior history of Echocardiogram examinations, most                 recent 12/22/2020. Endocarditis, Arrythmias:Atrial Fibrillation;                 Signs/Symptoms:Syncope.  Sonographer:    Glo Herring Referring Phys: [id number] [place]  1. Left ventricular ejection fraction, by estimation, is 55 to 60%. Left ventricular ejection fraction by PLAX is 61 %. The left ventricle has normal function. The left ventricle has no regional wall motion abnormalities. There is mild concentric left ventricular hypertrophy. Left ventricular diastolic parameters are consistent with Grade II diastolic dysfunction (pseudonormalization). Elevated left ventricular end-diastolic pressure.  2. Right ventricular systolic function is normal. The right ventricular size is normal. There is normal pulmonary artery systolic pressure.  3. Left atrial size was mildly dilated.  4. The mitral valve is normal in structure. No evidence of mitral valve regurgitation. No evidence of mitral stenosis.  5. Aortic valve gradients unchanged from 12/2020. The aortic valve is tricuspid. There is moderate calcification of the aortic valve. There is mild thickening of the aortic valve. Aortic valve regurgitation is mild. Moderate to severe aortic valve stenosis. Aortic valve area, by VTI measures 0.82 cm. Aortic valve mean gradient measures 33.0 mmHg. Aortic valve Vmax measures 3.81 m/s.  6. The inferior vena cava is dilated in size with <50% respiratory variability, suggesting right atrial pressure of 15 mmHg. FINDINGS  Left Ventricle: Left ventricular ejection fraction, by estimation, is 55 to 60%. Left ventricular ejection fraction by PLAX is 61 %. The left ventricle has normal function. The left ventricle has no regional wall motion abnormalities. The left ventricular internal cavity size was normal in size.  There is mild concentric left ventricular hypertrophy. Left ventricular diastolic parameters are consistent with Grade II diastolic dysfunction (pseudonormalization). Elevated left ventricular end-diastolic pressure. Right Ventricle: The right ventricular size is normal. No increase in right ventricular wall thickness. Right ventricular systolic function is normal. There is normal pulmonary artery systolic pressure. The tricuspid regurgitant velocity is 2.12 m/s, and  with an assumed right atrial pressure of 15 mmHg, the estimated right ventricular systolic pressure is 62.5 mmHg. Left Atrium: Left atrial size was mildly dilated. Right Atrium: Right atrial size was normal in size. Pericardium: There is no evidence of pericardial effusion. Mitral Valve: The mitral valve is normal in structure. No evidence of mitral valve regurgitation. No evidence of mitral valve stenosis. Tricuspid Valve: The tricuspid valve is normal in structure. Tricuspid valve regurgitation is mild . No evidence of tricuspid stenosis. Aortic Valve: Aortic valve gradients unchanged from 12/2020. The aortic valve is tricuspid. There is  moderate calcification of the aortic valve. There is mild thickening of the aortic valve. Aortic valve regurgitation is mild. Moderate to severe aortic stenosis is present. Aortic valve mean gradient measures 33.0 mmHg. Aortic valve peak gradient measures 58.1 mmHg. Aortic valve area, by VTI measures 0.82 cm. Pulmonic Valve: The pulmonic valve was normal in structure. Pulmonic valve regurgitation is not visualized. No evidence of pulmonic stenosis. Aorta: The aortic root is normal in size and structure. Venous: The inferior vena cava is dilated in size with less than 50% respiratory variability, suggesting right atrial pressure of 15 mmHg. IAS/Shunts: No atrial level shunt detected by color flow Doppler.  LEFT VENTRICLE PLAX 2D LV EF:         Left            Diastology                ventricular     LV e' medial:     6.57 cm/s                ejection        LV E/e' medial:  17.4                fraction by     LV e' lateral:   7.62 cm/s                PLAX is 61      LV E/e' lateral: 15.0                %. LVIDd:         5.40 cm LVIDs:         3.60 cm LV PW:         1.20 cm LV IVS:        1.20 cm LVOT diam:     2.10 cm LV SV:         72 LV SV Index:   32 LVOT Area:     3.46 cm  LV Volumes (MOD) LV vol d, MOD    143.0 ml A2C: LV vol d, MOD    159.0 ml A4C: LV vol s, MOD    52.0 ml A2C: LV vol s, MOD    52.1 ml A4C: LV SV MOD A2C:   91.0 ml LV SV MOD A4C:   159.0 ml LV SV MOD BP:    99.9 ml RIGHT VENTRICLE            IVC RV S prime:     8.68 cm/s  IVC diam: 2.70 cm LEFT ATRIUM             Index LA diam:        4.10 cm 1.83 cm/m LA Vol (A2C):   51.1 ml 22.81 ml/m LA Vol (A4C):   73.7 ml 32.90 ml/m LA Biplane Vol: 62.4 ml 27.85 ml/m  AORTIC VALVE                     PULMONIC VALVE AV Area (Vmax):    0.81 cm      PV Vmax:       1.29 m/s AV Area (Vmean):   0.82 cm      PV Peak grad:  6.7 mmHg AV Area (VTI):     0.82 cm AV Vmax:           381.00 cm/s AV Vmean:          270.600 cm/s AV VTI:  0.884 m AV Peak Grad:      58.1 mmHg AV Mean Grad:      33.0 mmHg LVOT Vmax:         88.70 cm/s LVOT Vmean:        64.250 cm/s LVOT VTI:          0.209 m LVOT/AV VTI ratio: 0.24  AORTA Ao Root diam: 2.90 cm Ao Asc diam:  3.30 cm MITRAL VALVE                TRICUSPID VALVE MV Area (PHT): 4.80 cm     TR Peak grad:   18.0 mmHg MV Decel Time: 158 msec     TR Vmax:        212.00 cm/s MV E velocity: 114.00 cm/s MV A velocity: 92.60 cm/s   SHUNTS MV E/A ratio:  1.23         Systemic VTI:  0.21 m                             Systemic Diam: 2.10 cm Skeet Latch MD Electronically signed by Skeet Latch MD Signature Date/Time: 08/26/2021/5:11:21 PM    Final    CT Angio Chest/Abd/Pel for Dissection W and/or Wo Contrast  Result Date: 08/25/2021 CLINICAL DATA:  Aortic aneurysm, known or suspected Post CPR. EXAM: CT ANGIOGRAPHY CHEST,  ABDOMEN AND PELVIS TECHNIQUE: Non-contrast CT of the chest was initially obtained. Multidetector CT imaging through the chest, abdomen and pelvis was performed using the standard protocol during bolus administration of intravenous contrast. Multiplanar reconstructed images and MIPs were obtained and reviewed to evaluate the vascular anatomy. CONTRAST:  135m OMNIPAQUE IOHEXOL 350 MG/ML SOLN COMPARISON:  Chest radiograph earlier today. Abdominopelvic CT 12/18/2020. No prior chest CT available. FINDINGS: CTA CHEST FINDINGS Cardiovascular: No aortic hematoma noncontrast exam. The thoracic aorta is tortuous but normal in caliber. No aortic aneurysm or dissection. Conventional branching pattern from the aortic arch exam not tailored for evaluation of pulmonary arteries. No obvious central pulmonary embolus. The heart is upper normal in size. Trace pericardial effusion. Subcentimeter lobulated low-density adjacent to the descending aorta in the lower chest was present on prior exam, may represent lymphatic malformation or dilatation of the as azygous/semi azygous system. Mediastinum/Nodes: No enlarged mediastinal, hilar, or axillary lymph nodes. Patulous distal esophagus with small hiatal hernia. No thyroid nodule. Lungs/Pleura: No pneumothorax. Minimal retained mucus within the trachea and central bronchi. Minor subsegmental atelectasis in the right greater than left lower lobe. No pleural effusion or findings of pulmonary edema. Musculoskeletal: No definite rib fracture, mild motion artifact limits detailed rib assessment. No sternal fracture. Diffuse thoracic spondylosis with multilevel endplate spurring. No confluent chest wall contusion. Review of the MIP images confirms the above findings. CTA ABDOMEN AND PELVIS FINDINGS VASCULAR Aorta: Normal caliber abdominal aorta. No aneurysm. No dissection or acute findings. Celiac: Patent without evidence of aneurysm, dissection, vasculitis or significant stenosis. SMA: Patent  without evidence of aneurysm, dissection, vasculitis or significant stenosis. Renals: Both renal arteries are patent without evidence of aneurysm, dissection, vasculitis, fibromuscular dysplasia or significant stenosis. IMA: Patent. Inflow: Patent without evidence of aneurysm, dissection, vasculitis or significant stenosis. Veins: No obvious venous abnormality within the limitations of this arterial phase study. Review of the MIP images confirms the above findings. NON-VASCULAR Hepatobiliary: Suggestion of decreased hepatic density and micro nodular hepatic contours. No focal hepatic lesion on this arterial phase exam. Cholecystectomy with surgical clips in the gallbladder  fossa. Probable dropped clips adjacent to the inferior liver edge. Common bile duct is not well-defined, no evidence of biliary dilatation. Pancreas: Fatty atrophy.  No ductal dilatation or inflammation. Spleen: Normal in size and arterial enhancement. Adrenals/Urinary Tract: No adrenal nodule. Question of heterogeneous bilateral renal enhancement versus artifact from arms down positioning and habitus. No hydronephrosis or focal renal abnormality. Distended urinary bladder without wall thickening. No visualized urolithiasis. Stomach/Bowel: Small hiatal hernia. No bowel obstruction or inflammation. Moderate stool burden with moderate stool distending the rectum. Normal appendix. Lymphatic: Scattered small retroperitoneal lymph nodes are not enlarged by size criteria. No enlarged lymph nodes in the abdomen or pelvis. Reproductive: Quiescent uterus, deviating into the right pelvis. No adnexal mass. Other: No free air or ascites.  No abdominal wall hernia. Musculoskeletal: Multilevel degenerative change in the spine. 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 Review of the MIP images confirms the above findings. IMPRESSION: 1. No aortic dissection or acute aortic  abnormality. No aneurysm of the thoracoabdominal aorta. 2. Question of heterogeneous bilateral renal enhancement versus artifact from arms down positioning and habitus. Recommend correlation with urinalysis. 3. Patulous distal esophagus with small hiatal hernia. 4. Suggestion of decreased hepatic density and micro nodular hepatic contours, suggesting cirrhosis. Recommend correlation with cirrhosis risk factors 5. Moderate stool burden with moderate stool distending the rectum, suggesting constipation. Aortic Atherosclerosis (ICD10-I70.0). Electronically Signed   By: Keith Rake M.D.   On: 08/25/2021 20:13   VAS [place] LOWER EXTREMITY VENOUS (DVT)  Result Date: 08/26/2021  Lower Venous DVT Study Patient Name:  YURITZI KAMP [facility]  Date of Exam:   08/26/2021 Medical Rec #: [id number]         Accession #:    [id number] Date of Birth: 07-18-1961          Patient Gender: F Patient Age:   61 years Exam Location:  [facility] Procedure:      VAS [place] LOWER EXTREMITY VENOUS (DVT) Referring Phys: Nyoka Lint DOUTOVA --------------------------------------------------------------------------------  Indications: Edema.  Limitations: Body habitus and poor ultrasound/tissue interface. Comparison Study: No prior study Performing Technologist: Maudry Mayhew MHA, RDMS, RVT, RDCS  Examination Guidelines: A complete evaluation includes B-mode imaging, spectral Doppler, color Doppler, and power Doppler as needed of all accessible portions of each vessel. Bilateral testing is considered an integral part of a complete examination. Limited examinations for reoccurring indications may be performed as noted. The reflux portion of the exam is performed with the patient in reverse Trendelenburg.  +---------+---------------+---------+-----------+----------+--------------+  RIGHT     Compressibility Phasicity Spontaneity Properties Thrombus Aging  +---------+---------------+---------+-----------+----------+--------------+  CFV       Full             Yes       Yes                                    +---------+---------------+---------+-----------+----------+--------------+  SFJ       Full                                                             +---------+---------------+---------+-----------+----------+--------------+  FV Prox   Full                                                             +---------+---------------+---------+-----------+----------+--------------+  FV Mid    Full                                                             +---------+---------------+---------+-----------+----------+--------------+  FV Distal Full                                                             +---------+---------------+---------+-----------+----------+--------------+  PFV       Full                                                             +---------+---------------+---------+-----------+----------+--------------+  POP       Full            Yes       Yes                                    +---------+---------------+---------+-----------+----------+--------------+  PTV       Full                                                             +---------+---------------+---------+-----------+----------+--------------+  PERO      Full                                                             +---------+---------------+---------+-----------+----------+--------------+   +---------+---------------+---------+-----------+----------+--------------+  LEFT      Compressibility Phasicity Spontaneity Properties Thrombus Aging  +---------+---------------+---------+-----------+----------+--------------+  CFV       Full            Yes       Yes                                    +---------+---------------+---------+-----------+----------+--------------+  SFJ       Full                                                             +---------+---------------+---------+-----------+----------+--------------+  FV Prox   Full                                                              +---------+---------------+---------+-----------+----------+--------------+  FV Mid    Full                                                             +---------+---------------+---------+-----------+----------+--------------+  FV Distal Full                                                             +---------+---------------+---------+-----------+----------+--------------+  PFV       Full                                                             +---------+---------------+---------+-----------+----------+--------------+  POP       Full            Yes       Yes                                    +---------+---------------+---------+-----------+----------+--------------+  PTV       Full                                                             +---------+---------------+---------+-----------+----------+--------------+  PERO      Full                                                             +---------+---------------+---------+-----------+----------+--------------+     Summary: RIGHT: - There is no evidence of deep vein thrombosis in the lower extremity.  - No cystic structure found in the popliteal fossa.  LEFT: - There is no evidence of deep vein thrombosis in the lower extremity.  - No cystic structure found in the popliteal fossa.  *See table(s) above for measurements and observations. Electronically signed by Orlie Pollen on 08/26/2021 at 79:57:53 PM.    Final     Cardiac Studies   Echo  08/26/21  Left ventricular ejection fraction, by estimation, is 55 to 60%. Left ventricular ejection fraction by PLAX is 61 %. The left ventricle has normal function. The left ventricle has no regional wall motion abnormalities. There is mild concentric left ventricular hypertrophy. Left ventricular diastolic parameters are consistent with Grade II diastolic dysfunction (pseudonormalization). Elevated left ventricular end-diastolic pressure. 1. Right ventricular systolic function is normal. The right  ventricular size is normal. There is normal pulmonary artery systolic pressure. 2. 3. Left atrial size was mildly dilated. The mitral valve is normal in structure. No evidence of mitral valve regurgitation. No evidence of mitral stenosis. 4. Aortic  valve gradients unchanged from 12/2020. The aortic valve is tricuspid. There is moderate calcification of the aortic valve. There is mild thickening of the aortic valve. Aortic valve regurgitation is mild. Moderate to severe aortic valve stenosis. Aortic valve area, by VTI measures 0.82 cm. Aortic valve mean gradient measures 33.0 mmHg. Aortic valve Vmax measures 3.81 m/s. 5. The inferior vena cava is dilated in size with <50% respiratory variability, suggesting right atrial pressure of 15 mmHg.  Patient Profile     61 y.o. female with history of moderate AS, HTN, morbid obesity and chronic pain on narcotics admitted following syncopal event, possible PEA arrest.   Assessment & Plan   Syncope   Pt found down at home    No prodrome   Pt active   Does use pain meds prn but she says not abusive  Epi and CPR performed   Pt reported to give 1 dose of Narcan Became combative.  I have reviewed echo from this admit   AV is thickend, calcified   Difficult to see well   Does appear to have restricted in motion   Mean gradient is 33 mm Hg  SUgg mod AS   But dimensionless index is 0.24 consistent with severe AS       TEE from the spring  Images show AV trileaflet with moderately restricted motion.  Does not appear severa at all  Current spell may have been multifactoral    Narcotic use in setting of low po intake in setting of moderate AS    I am not convinced, after review of echo and TEE taht valve is sole contributer to above    Will of course need to be followed  Reviewed iwht pt importance of hydration.    Follow symptoms     AS   As noted above   Trop elevation Probably due to subendocardial ischemia in setting of event  and AS   Atrial  fibrillation post arrest:   Follow   Remains in SR No indication for anticoagulation    Aortic stenosis:  Echo yesterday, mean gradient  33 mm Hg   AVA 0.82cm2   Dimensionless index 0.24     ID   Follows patient in outpt setting   Pt needs to follow as one day will probably need valve replacement      Ambulate today  Follow symptoms  For questions or updates, please contact [facility] Please consult [email or web address] for contact info under        Signed, Dorris Carnes, MD  08/27/2021, 7:52 AM

## 2021-08-27 NOTE — Plan of Care (Signed)
°  Problem: Education: Goal: Ability to demonstrate management of disease process will improve Outcome: Progressing   Problem: Cardiac: Goal: Ability to achieve and maintain adequate cardiopulmonary perfusion will improve Outcome: Progressing   Problem: Activity: Goal: Ability to tolerate increased activity will improve Outcome: Progressing   Problem: Health Behavior/Discharge Planning: Goal: Ability to safely manage health-related needs after discharge will improve Outcome: Progressing

## 2021-08-27 NOTE — Progress Notes (Signed)
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 In the emergency room she was noted to be hypokalemic, troponin was 112, head CT, COVID PCR were negative, CTA chest -no pulmonary embolism, noted atelectasis, CT abdomen noted constipation, ? cirrhosis, CT   Assessment & Plan:   Syncope/ Possible PEA arrest  Aortic stenosis, moderate to severe -Was found down poorly responsive and bradycardic, per EMS documentation, concern for PEA status post CPR X 8 minutes and 1 round of epi  -Mental status improved after Narcan, raises the possibility of polypharmacy, oversedation following narcotics and benzos as likely etiology  -Clinically do not suspect true ACS, troponins minimally elevated  -CTA chest negative for pulmonary embolism,  -2D echo with at least moderate -severe aortic stenosis, could have contributed to above -Await cardiology input -Ambulate, PT OT eval  Transient atrial fibrillation  -Following arrest, remains in sinus rhythm  -Appreciate cardiology input, no indication for anticoagulation at this time   History of moderate aortic stenosis -See discussion above  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  Morbid obesity   DVT prophylaxis: On Lovenox Code Status: Full code Family Communication: Discussed patient in detail, no family at bedside Disposition Plan:  Status is: inpatient   Consultants:  Cardiology  Procedures:   Antimicrobials:    Subjective: -Feels a little queasy and uneasy today, poor appetite, denies chest pain shortness of breath, denies abdominal pain  Objective: Vitals:   08/26/21 2049 08/26/21 2051 08/27/21 0012 08/27/21 0404  BP: 130/75 130/75 112/65 134/70  Pulse: 89 86 87 89  Resp: 18  18 18   Temp: 98.5 F (36.9 C)  98.5 F (36.9 C) 98.3 F (36.8 C)  TempSrc: Oral  Oral Oral  SpO2: 97% 97% 97% 98%  Weight:      Height:        Intake/Output Summary (Last 24 hours) at 08/27/2021 1258 Last data filed at 08/26/2021 1800 Gross per 24 hour  Intake 424.39 ml  Output --  Net 424.39 ml   Filed Weights   08/25/21 1833  Weight: 121.6 kg    Examination:  General exam: Obese chronically ill female, sitting up in bed, AAOx3, no distress HEENT: No JVD CVS: S1-S2, regular rhythm, systolic murmur Lungs: Decreased breath sounds to bases otherwise clear Abdomen: Soft, nontender, bowel sounds present Extremities: No edema  Skin: No rash on exposed skin Psych: Appropriate mood and affect Neuro: Moves all extremities, no localizing signs  Data Reviewed:   CBC: Recent Labs  Lab 08/25/21 1846 08/25/21 1900 08/25/21 2054 08/26/21 5784  08/26/21 0344 08/27/21 0213  WBC 20.1*  --  15.0*  --  10.6* 8.5  NEUTROABS  --   --  12.8*  --  9.0*  --   HGB 12.9 13.6 11.8* 11.9* 10.6* 11.3*  HCT 40.3 40.0 37.2 35.0* 34.1* 36.0  MCV 95.5  --  94.7  --  97.2 95.0  PLT 259  --   232  --  208 256   Basic Metabolic Panel: Recent Labs  Lab 08/25/21 1846 08/25/21 1900 08/25/21 2054 08/26/21 0102 08/26/21 0117 08/26/21 0344 08/27/21 0213  NA 138 142  --  136 140 137 140  K 2.9* 2.9*  --  4.3 4.6 4.4 3.5  CL 103 103  --  104  --  104 106  CO2 27  --   --  27  --  26 27  GLUCOSE 134* 135*  --  109*  --  108* 97  BUN 15 18  --  14  --  13 9  CREATININE 0.77 0.60  --  0.53  --  0.48 0.50  CALCIUM 9.8  --   --  8.6*  --  8.7* 9.1  MG 2.5*  --  2.2  --   --  2.1  --   PHOS  --   --  3.1  --   --  4.0  --    GFR: Estimated Creatinine Clearance: 99 mL/min (by C-G formula based on SCr of 0.5 mg/dL). Liver Function Tests: Recent Labs  Lab 08/25/21 2054 08/26/21 0344  AST 72* 55*  ALT 63* 52*  ALKPHOS 101 83  BILITOT 0.4 0.6  PROT 7.1 6.2*  ALBUMIN 3.5 3.2*   No results for input(s): LIPASE, AMYLASE in the last 168 hours. Recent Labs  Lab 08/26/21 0102  AMMONIA 27   Coagulation Profile: Recent Labs  Lab 08/26/21 0102  INR 1.1   Cardiac Enzymes: Recent Labs  Lab 08/25/21 2054  CKTOTAL 175   BNP (last 3 results) No results for input(s): PROBNP in the last 8760 hours. HbA1C: No results for input(s): HGBA1C in the last 72 hours. CBG: Recent Labs  Lab 08/25/21 1849  GLUCAP 129*   Lipid Profile: Recent Labs    08/26/21 0344  CHOL 174  HDL 62  LDLCALC 108*  TRIG 21  CHOLHDL 2.8   Thyroid Function Tests: Recent Labs    08/26/21 0344  TSH 0.375   Anemia Panel: No results for input(s): VITAMINB12, FOLATE, FERRITIN, TIBC, IRON, RETICCTPCT in the last 72 hours. Urine analysis:    Component Value Date/Time   COLORURINE YELLOW 08/26/2021 0253   APPEARANCEUR HAZY (A) 08/26/2021 0253   LABSPEC 1.037 (H) 08/26/2021 0253   PHURINE 5.0 08/26/2021 0253   GLUCOSEU >=500 (A) 08/26/2021 0253   HGBUR NEGATIVE 08/26/2021 0253   BILIRUBINUR NEGATIVE 08/26/2021 0253   KETONESUR NEGATIVE 08/26/2021 0253   PROTEINUR NEGATIVE 08/26/2021 0253    UROBILINOGEN 0.2 11/20/2013 1603   NITRITE NEGATIVE 08/26/2021 0253   LEUKOCYTESUR NEGATIVE 08/26/2021 0253   Sepsis Labs: @LABRCNTIP (procalcitonin:4,lacticidven:4)  ) Recent Results (from the past 240 hour(s))  Resp Panel by RT-PCR (Flu A&B, Covid) Nasopharyngeal Swab     Status: None   Collection Time: 08/25/21  6:22 PM   Specimen: Nasopharyngeal Swab; Nasopharyngeal(NP) swabs in vial transport medium  Result Value Ref Range Status   SARS Coronavirus 2 by RT PCR NEGATIVE NEGATIVE Final    Comment: (NOTE) SARS-CoV-2 target nucleic acids are NOT DETECTED.  The SARS-CoV-2 RNA is  generally detectable in upper respiratory specimens during the acute phase of infection. The lowest concentration of SARS-CoV-2 viral copies this assay can detect is 138 copies/mL. A negative result does not preclude SARS-Cov-2 infection and should not be used as the sole basis for treatment or other patient management decisions. A negative result may occur with  improper specimen collection/handling, submission of specimen other than nasopharyngeal swab, presence of viral mutation(s) within the areas targeted by this assay, and inadequate number of viral copies(<138 copies/mL). A negative result must be combined with clinical observations, patient history, and epidemiological information. The expected result is Negative.  Fact Sheet for Patients:  EntrepreneurPulse.com.au  Fact Sheet for Healthcare Providers:  IncredibleEmployment.be  This test is no t yet approved or cleared by the [place] FDA and  has been authorized for detection and/or diagnosis of SARS-CoV-2 by FDA under an Emergency Use Authorization (EUA). This EUA will remain  in effect (meaning this test can be used) for the duration of the COVID-19 declaration under Section 564(b)(1) of the Act, 21 U.S.C.section 360bbb-3(b)(1), unless the authorization is terminated  or revoked sooner.        Influenza A by PCR NEGATIVE NEGATIVE Final   Influenza B by PCR NEGATIVE NEGATIVE Final    Comment: (NOTE) The Xpert Xpress SARS-CoV-2/FLU/RSV plus assay is intended as an aid in the diagnosis of influenza from Nasopharyngeal swab specimens and should not be used as a sole basis for treatment. Nasal washings and aspirates are unacceptable for Xpert Xpress SARS-CoV-2/FLU/RSV testing.  Fact Sheet for Patients: EntrepreneurPulse.com.au  Fact Sheet for Healthcare Providers: IncredibleEmployment.be  This test is not yet approved or cleared by the [place] FDA and has been authorized for detection and/or diagnosis of SARS-CoV-2 by FDA under an Emergency Use Authorization (EUA). This EUA will remain in effect (meaning this test can be used) for the duration of the COVID-19 declaration under Section 564(b)(1) of the Act, 21 U.S.C. section 360bbb-3(b)(1), unless the authorization is terminated or revoked.  Performed at [facility], [place] [address], [place], [place] [address]   Blood culture (routine x 2)     Status: None (Preliminary result)   Collection Time: 08/25/21  8:54 PM   Specimen: BLOOD  Result Value Ref Range Status   Specimen Description BLOOD RIGHT ANTECUBITAL  Final   Special Requests   Final    BOTTLES DRAWN AEROBIC AND ANAEROBIC Blood Culture adequate volume   Culture   Final    NO GROWTH 2 DAYS Performed at [facility], [place] [address] [place], [place] [address]    Report Status PENDING  Incomplete  Blood culture (routine x 2)     Status: None (Preliminary result)   Collection Time: 08/25/21  8:54 PM   Specimen: BLOOD  Result Value Ref Range Status   Specimen Description BLOOD RIGHT ANTECUBITAL  Final   Special Requests   Final    BOTTLES DRAWN AEROBIC AND ANAEROBIC Blood Culture adequate volume   Culture   Final    NO GROWTH 2 DAYS Performed at [facility], [place] [address] [place], [place] [address]     Report Status PENDING  Incomplete         Radiology Studies: CT Head Wo Contrast  Result Date: 08/25/2021 CLINICAL DATA:  Mental status change, unknown cause EXAM: CT HEAD WITHOUT CONTRAST TECHNIQUE: Contiguous axial images were obtained from the base of the skull through the vertex without intravenous contrast. COMPARISON:  03/29/2006. FINDINGS: Brain:  No evidence of acute infarction, hemorrhage, cerebral edema, mass, mass effect, or midline shift. No hydrocephalus or extra-axial fluid collection. Vascular: No hyperdense vessel. Skull: Normal. Negative for fracture or focal lesion. Sinuses/Orbits: Mucous retention cyst in the left frontal sinus. Mild mucosal thickening in the ethmoid air cells. The orbits are unremarkable. Other: The mastoid air cells are well aerated. IMPRESSION: IMPRESSION No acute intracranial process. Electronically Signed   By: Merilyn Baba M.D.   On: 08/25/2021 20:00   DG CHEST PORT 1 VIEW  Result Date: 08/26/2021 CLINICAL DATA:  Respiratory failure with hypoxia. EXAM: PORTABLE CHEST 1 VIEW COMPARISON:  08/25/2021. FINDINGS: The heart is enlarged and the mediastinal contour stable. The pulmonary vasculature is mildly distended. Minimal atelectasis is noted at the lung bases. No consolidation, effusion, or pneumothorax. No acute osseous abnormality. IMPRESSION: 1. Cardiomegaly with mildly distended pulmonary vasculature. 2. Mild atelectasis at the lung bases. Electronically Signed   By: Brett Fairy M.D.   On: 08/26/2021 02:22   DG Chest Portable 1 View  Result Date: 08/25/2021 CLINICAL DATA:  CPR. EXAM: PORTABLE CHEST 1 VIEW COMPARISON:  Chest radiograph 01/17/2021 FINDINGS: Chronic cardiomegaly. Unchanged mediastinal contours allowing for differences in technique. No pneumothorax or pulmonary edema. Bibasilar atelectasis, left greater than right. No pleural effusion. On limited evaluation, no acute osseous abnormalities are seen. IMPRESSION: Chronic cardiomegaly.  Bibasilar atelectasis. Electronically Signed   By: Keith Rake M.D.   On: 08/25/2021 18:47   ECHOCARDIOGRAM COMPLETE  Result Date: 08/26/2021    ECHOCARDIOGRAM REPORT   Patient Name:   Nicole Hines [facility] Date of Exam: 08/26/2021 Medical Rec #:  [id number]        Height:       65.0 in Accession #:    [id number]       Weight:       268.1 lb Date of Birth:  1961-07-14         BSA:          2.240 m Patient Age:    42 years         BP:           105/68 mmHg Patient Gender: F                HR:           79 bpm. Exam Location:  Inpatient Procedure: 2D Echo, Cardiac Doppler, Color Doppler and Intracardiac            Opacification Agent Indications:    Syncope  History:        Patient has prior history of Echocardiogram examinations, most                 recent 12/22/2020. Endocarditis, Arrythmias:Atrial Fibrillation;                 Signs/Symptoms:Syncope.  Sonographer:    Glo Herring Referring Phys: [id number] Rosalie  1. Left ventricular ejection fraction, by estimation, is 55 to 60%. Left ventricular ejection fraction by PLAX is 61 %. The left ventricle has normal function. The left ventricle has no regional wall motion abnormalities. There is mild concentric left ventricular hypertrophy. Left ventricular diastolic parameters are consistent with Grade II diastolic dysfunction (pseudonormalization). Elevated left ventricular end-diastolic pressure.  2. Right ventricular systolic function is normal. The right ventricular size is normal. There is normal pulmonary artery systolic pressure.  3. Left atrial size was mildly dilated.  4. The mitral valve is normal in structure. No evidence of  mitral valve regurgitation. No evidence of mitral stenosis.  5. Aortic valve gradients unchanged from 12/2020. The aortic valve is tricuspid. There is moderate calcification of the aortic valve. There is mild thickening of the aortic valve. Aortic valve regurgitation is mild. Moderate to severe aortic valve stenosis.  Aortic valve area, by VTI measures 0.82 cm. Aortic valve mean gradient measures 33.0 mmHg. Aortic valve Vmax measures 3.81 m/s.  6. The inferior vena cava is dilated in size with <50% respiratory variability, suggesting right atrial pressure of 15 mmHg. FINDINGS  Left Ventricle: Left ventricular ejection fraction, by estimation, is 55 to 60%. Left ventricular ejection fraction by PLAX is 61 %. The left ventricle has normal function. The left ventricle has no regional wall motion abnormalities. The left ventricular internal cavity size was normal in size. There is mild concentric left ventricular hypertrophy. Left ventricular diastolic parameters are consistent with Grade II diastolic dysfunction (pseudonormalization). Elevated left ventricular end-diastolic pressure. Right Ventricle: The right ventricular size is normal. No increase in right ventricular wall thickness. Right ventricular systolic function is normal. There is normal pulmonary artery systolic pressure. The tricuspid regurgitant velocity is 2.12 m/s, and  with an assumed right atrial pressure of 15 mmHg, the estimated right ventricular systolic pressure is 68.3 mmHg. Left Atrium: Left atrial size was mildly dilated. Right Atrium: Right atrial size was normal in size. Pericardium: There is no evidence of pericardial effusion. Mitral Valve: The mitral valve is normal in structure. No evidence of mitral valve regurgitation. No evidence of mitral valve stenosis. Tricuspid Valve: The tricuspid valve is normal in structure. Tricuspid valve regurgitation is mild . No evidence of tricuspid stenosis. Aortic Valve: Aortic valve gradients unchanged from 12/2020. The aortic valve is tricuspid. There is moderate calcification of the aortic valve. There is mild thickening of the aortic valve. Aortic valve regurgitation is mild. Moderate to severe aortic stenosis is present. Aortic valve mean gradient measures 33.0 mmHg. Aortic valve peak gradient measures 58.1 mmHg.  Aortic valve area, by VTI measures 0.82 cm. Pulmonic Valve: The pulmonic valve was normal in structure. Pulmonic valve regurgitation is not visualized. No evidence of pulmonic stenosis. Aorta: The aortic root is normal in size and structure. Venous: The inferior vena cava is dilated in size with less than 50% respiratory variability, suggesting right atrial pressure of 15 mmHg. IAS/Shunts: No atrial level shunt detected by color flow Doppler.  LEFT VENTRICLE PLAX 2D LV EF:         Left            Diastology                ventricular     LV e' medial:    6.57 cm/s                ejection        LV E/e' medial:  17.4                fraction by     LV e' lateral:   7.62 cm/s                PLAX is 61      LV E/e' lateral: 15.0                %. LVIDd:         5.40 cm LVIDs:         3.60 cm LV PW:         1.20 cm LV  IVS:        1.20 cm LVOT diam:     2.10 cm LV SV:         72 LV SV Index:   32 LVOT Area:     3.46 cm  LV Volumes (MOD) LV vol d, MOD    143.0 ml A2C: LV vol d, MOD    159.0 ml A4C: LV vol s, MOD    52.0 ml A2C: LV vol s, MOD    52.1 ml A4C: LV SV MOD A2C:   91.0 ml LV SV MOD A4C:   159.0 ml LV SV MOD BP:    99.9 ml RIGHT VENTRICLE            IVC RV S prime:     8.68 cm/s  IVC diam: 2.70 cm LEFT ATRIUM             Index LA diam:        4.10 cm 1.83 cm/m LA Vol (A2C):   51.1 ml 22.81 ml/m LA Vol (A4C):   73.7 ml 32.90 ml/m LA Biplane Vol: 62.4 ml 27.85 ml/m  AORTIC VALVE                     PULMONIC VALVE AV Area (Vmax):    0.81 cm      PV Vmax:       1.29 m/s AV Area (Vmean):   0.82 cm      PV Peak grad:  6.7 mmHg AV Area (VTI):     0.82 cm AV Vmax:           381.00 cm/s AV Vmean:          270.600 cm/s AV VTI:            0.884 m AV Peak Grad:      58.1 mmHg AV Mean Grad:      33.0 mmHg LVOT Vmax:         88.70 cm/s LVOT Vmean:        64.250 cm/s LVOT VTI:          0.209 m LVOT/AV VTI ratio: 0.24  AORTA Ao Root diam: 2.90 cm Ao Asc diam:  3.30 cm MITRAL VALVE                TRICUSPID VALVE MV Area  (PHT): 4.80 cm     TR Peak grad:   18.0 mmHg MV Decel Time: 158 msec     TR Vmax:        212.00 cm/s MV E velocity: 114.00 cm/s MV A velocity: 92.60 cm/s   SHUNTS MV E/A ratio:  1.23         Systemic VTI:  0.21 m                             Systemic Diam: 2.10 cm Skeet Latch MD Electronically signed by Skeet Latch MD Signature Date/Time: 08/26/2021/5:11:21 PM    Final    CT Angio Chest/Abd/Pel for Dissection W and/or Wo Contrast  Result Date: 08/25/2021 CLINICAL DATA:  Aortic aneurysm, known or suspected Post CPR. EXAM: CT ANGIOGRAPHY CHEST, ABDOMEN AND PELVIS TECHNIQUE: Non-contrast CT of the chest was initially obtained. Multidetector CT imaging through the chest, abdomen and pelvis was performed using the standard protocol during bolus administration of intravenous contrast. Multiplanar reconstructed images and MIPs were obtained and reviewed to evaluate the vascular anatomy. CONTRAST:  158m OMNIPAQUE  IOHEXOL 350 MG/ML SOLN COMPARISON:  Chest radiograph earlier today. Abdominopelvic CT 12/18/2020. No prior chest CT available. FINDINGS: CTA CHEST FINDINGS Cardiovascular: No aortic hematoma noncontrast exam. The thoracic aorta is tortuous but normal in caliber. No aortic aneurysm or dissection. Conventional branching pattern from the aortic arch exam not tailored for evaluation of pulmonary arteries. No obvious central pulmonary embolus. The heart is upper normal in size. Trace pericardial effusion. Subcentimeter lobulated low-density adjacent to the descending aorta in the lower chest was present on prior exam, may represent lymphatic malformation or dilatation of the as azygous/semi azygous system. Mediastinum/Nodes: No enlarged mediastinal, hilar, or axillary lymph nodes. Patulous distal esophagus with small hiatal hernia. No thyroid nodule. Lungs/Pleura: No pneumothorax. Minimal retained mucus within the trachea and central bronchi. Minor subsegmental atelectasis in the right greater than left  lower lobe. No pleural effusion or findings of pulmonary edema. Musculoskeletal: No definite rib fracture, mild motion artifact limits detailed rib assessment. No sternal fracture. Diffuse thoracic spondylosis with multilevel endplate spurring. No confluent chest wall contusion. Review of the MIP images confirms the above findings. CTA ABDOMEN AND PELVIS FINDINGS VASCULAR Aorta: Normal caliber abdominal aorta. No aneurysm. No dissection or acute findings. Celiac: Patent without evidence of aneurysm, dissection, vasculitis or significant stenosis. SMA: Patent without evidence of aneurysm, dissection, vasculitis or significant stenosis. Renals: Both renal arteries are patent without evidence of aneurysm, dissection, vasculitis, fibromuscular dysplasia or significant stenosis. IMA: Patent. Inflow: Patent without evidence of aneurysm, dissection, vasculitis or significant stenosis. Veins: No obvious venous abnormality within the limitations of this arterial phase study. Review of the MIP images confirms the above findings. NON-VASCULAR Hepatobiliary: Suggestion of decreased hepatic density and micro nodular hepatic contours. No focal hepatic lesion on this arterial phase exam. Cholecystectomy with surgical clips in the gallbladder fossa. Probable dropped clips adjacent to the inferior liver edge. Common bile duct is not well-defined, no evidence of biliary dilatation. Pancreas: Fatty atrophy.  No ductal dilatation or inflammation. Spleen: Normal in size and arterial enhancement. Adrenals/Urinary Tract: No adrenal nodule. Question of heterogeneous bilateral renal enhancement versus artifact from arms down positioning and habitus. No hydronephrosis or focal renal abnormality. Distended urinary bladder without wall thickening. No visualized urolithiasis. Stomach/Bowel: Small hiatal hernia. No bowel obstruction or inflammation. Moderate stool burden with moderate stool distending the rectum. Normal appendix. Lymphatic:  Scattered small retroperitoneal lymph nodes are not enlarged by size criteria. No enlarged lymph nodes in the abdomen or pelvis. Reproductive: Quiescent uterus, deviating into the right pelvis. No adnexal mass. Other: No free air or ascites.  No abdominal wall hernia. Musculoskeletal: Multilevel degenerative change in the spine. 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 Review of the MIP images confirms the above findings. IMPRESSION: 1. No aortic dissection or acute aortic abnormality. No aneurysm of the thoracoabdominal aorta. 2. Question of heterogeneous bilateral renal enhancement versus artifact from arms down positioning and habitus. Recommend correlation with urinalysis. 3. Patulous distal esophagus with small hiatal hernia. 4. Suggestion of decreased hepatic density and micro nodular hepatic contours, suggesting cirrhosis. Recommend correlation with cirrhosis risk factors 5. Moderate stool burden with moderate stool distending the rectum, suggesting constipation. Aortic Atherosclerosis (ICD10-I70.0). Electronically Signed   By: Keith Rake M.D.   On: 08/25/2021 20:13   VAS [place] LOWER EXTREMITY VENOUS (DVT)  Result Date: 08/26/2021  Lower Venous DVT Study Patient Name:  MINNA DUMIRE [facility]  Date of Exam:   08/26/2021 Medical Rec #:  [id number]         Accession #:    [id number] Date of Birth: 1961/08/15          Patient Gender: F Patient Age:   69 years Exam Location:  [facility] Procedure:      VAS [place] LOWER EXTREMITY VENOUS (DVT) Referring Phys: Nyoka Lint DOUTOVA --------------------------------------------------------------------------------  Indications: Edema.  Limitations: Body habitus and poor ultrasound/tissue interface. Comparison Study: No prior study Performing Technologist: Maudry Mayhew MHA, RDMS, RVT, RDCS  Examination Guidelines: A complete evaluation includes B-mode imaging, spectral Doppler,  color Doppler, and power Doppler as needed of all accessible portions of each vessel. Bilateral testing is considered an integral part of a complete examination. Limited examinations for reoccurring indications may be performed as noted. The reflux portion of the exam is performed with the patient in reverse Trendelenburg.  +---------+---------------+---------+-----------+----------+--------------+  RIGHT     Compressibility Phasicity Spontaneity Properties Thrombus Aging  +---------+---------------+---------+-----------+----------+--------------+  CFV       Full            Yes       Yes                                    +---------+---------------+---------+-----------+----------+--------------+  SFJ       Full                                                             +---------+---------------+---------+-----------+----------+--------------+  FV Prox   Full                                                             +---------+---------------+---------+-----------+----------+--------------+  FV Mid    Full                                                             +---------+---------------+---------+-----------+----------+--------------+  FV Distal Full                                                             +---------+---------------+---------+-----------+----------+--------------+  PFV       Full                                                             +---------+---------------+---------+-----------+----------+--------------+  POP       Full            Yes       Yes                                    +---------+---------------+---------+-----------+----------+--------------+  PTV       Full                                                             +---------+---------------+---------+-----------+----------+--------------+  PERO      Full                                                             +---------+---------------+---------+-----------+----------+--------------+    +---------+---------------+---------+-----------+----------+--------------+  LEFT      Compressibility Phasicity Spontaneity Properties Thrombus Aging  +---------+---------------+---------+-----------+----------+--------------+  CFV       Full            Yes       Yes                                    +---------+---------------+---------+-----------+----------+--------------+  SFJ       Full                                                             +---------+---------------+---------+-----------+----------+--------------+  FV Prox   Full                                                             +---------+---------------+---------+-----------+----------+--------------+  FV Mid    Full                                                             +---------+---------------+---------+-----------+----------+--------------+  FV Distal Full                                                             +---------+---------------+---------+-----------+----------+--------------+  PFV       Full                                                             +---------+---------------+---------+-----------+----------+--------------+  POP       Full            Yes       Yes                                    +---------+---------------+---------+-----------+----------+--------------+  PTV       Full                                                             +---------+---------------+---------+-----------+----------+--------------+  PERO      Full                                                             +---------+---------------+---------+-----------+----------+--------------+     Summary: RIGHT: - There is no evidence of deep vein thrombosis in the lower extremity.  - No cystic structure found in the popliteal fossa.  LEFT: - There is no evidence of deep vein thrombosis in the lower extremity.  - No cystic structure found in the popliteal fossa.  *See table(s) above for measurements and observations. Electronically signed  by Orlie Pollen on 08/26/2021 at 6:57:53 PM.    Final         Scheduled Meds:  Chlorhexidine Gluconate Cloth  6 each Topical Daily   doxycycline  100 mg Oral Q12H   enoxaparin (LOVENOX) injection  40 mg Subcutaneous Q24H   polyethylene glycol  17 g Oral BID   Continuous Infusions:     LOS: 1 day    Time spent: 58mn    PDomenic Polite MD [facility]   08/27/2021, 12:58 PM

## 2021-08-27 NOTE — Progress Notes (Signed)
Patient had run of SVT (rate 158) at 1624.  Patient asymptomatic. Dr. Broadus John paged and aware.  No new orders received.

## 2021-08-28 ENCOUNTER — Other Ambulatory Visit: Payer: Self-pay | Admitting: Physician Assistant

## 2021-08-28 DIAGNOSIS — J9601 Acute respiratory failure with hypoxia: Secondary | ICD-10-CM

## 2021-08-28 DIAGNOSIS — I350 Nonrheumatic aortic (valve) stenosis: Secondary | ICD-10-CM

## 2021-08-28 DIAGNOSIS — I4891 Unspecified atrial fibrillation: Secondary | ICD-10-CM | POA: Diagnosis not present

## 2021-08-28 DIAGNOSIS — R55 Syncope and collapse: Secondary | ICD-10-CM | POA: Diagnosis not present

## 2021-08-28 DIAGNOSIS — I469 Cardiac arrest, cause unspecified: Secondary | ICD-10-CM

## 2021-08-28 LAB — BASIC METABOLIC PANEL
Anion gap: 8 (ref 5–15)
BUN: 7 mg/dL (ref 6–20)
CO2: 28 mmol/L (ref 22–32)
Calcium: 9.5 mg/dL (ref 8.9–10.3)
Chloride: 104 mmol/L (ref 98–111)
Creatinine, Ser: 0.48 mg/dL (ref 0.44–1.00)
GFR, Estimated: >60 mL/min (ref >60)
Glucose, Bld: 110 mg/dL — ABNORMAL HIGH (ref 70–99)
Potassium: 3.9 mmol/L (ref 3.5–5.1)
Sodium: 140 mmol/L (ref 135–145)

## 2021-08-28 LAB — CBC
HCT: 36.3 % (ref 36.0–46.0)
Hemoglobin: 11.6 g/dL — ABNORMAL LOW (ref 12.0–15.0)
MCH: 29.9 pg (ref 26.0–34.0)
MCHC: 32 g/dL (ref 30.0–36.0)
MCV: 93.6 fL (ref 80.0–100.0)
Platelets: 200 10*3/uL (ref 150–400)
RBC: 3.88 MIL/uL (ref 3.87–5.11)
RDW: 12.7 % (ref 11.5–15.5)
WBC: 6.1 10*3/uL (ref 4.0–10.5)
nRBC: 0 % (ref 0.0–0.2)

## 2021-08-28 NOTE — Progress Notes (Addendum)
Progress Note  Patient Name: Nicole Hines Date of Encounter: 08/28/2021  [facility] Cardiologist: None   Subjective   Chest wall still a little sore  No other CP  No dizzienss  No SOB   Inpatient Medications    Scheduled Meds:  Chlorhexidine Gluconate Cloth  6 each Topical Daily   doxycycline  100 mg Oral Q12H   enoxaparin (LOVENOX) injection  40 mg Subcutaneous Q24H   polyethylene glycol  17 g Oral BID   Continuous Infusions:   PRN Meds: acetaminophen **OR** acetaminophen, albuterol, HYDROcodone-acetaminophen, naLOXone (NARCAN)  injection, ondansetron (ZOFRAN) IV   Vital Signs    Vitals:   08/27/21 1254 08/27/21 1639 08/27/21 1909 08/28/21 0510  BP: 136/66 (!) 141/65 132/68 132/61  Pulse: 81 91 77 68  Resp:  18 19 16   Temp: 98.8 F (37.1 C) 98.4 F (36.9 C) 98.2 F (36.8 C) 98.3 F (36.8 C)  TempSrc: Oral Oral Oral Oral  SpO2: 92% 95% 95% 95%  Weight:      Height:        Intake/Output Summary (Last 24 hours) at 08/28/2021 0802 Last data filed at 08/28/2021 0600 Gross per 24 hour  Intake 480 ml  Output 1000 ml  Net -520 ml   Last 3 Weights 08/25/2021 07/04/2021 05/16/2021  Weight (lbs) 268 lb 1.3 oz 268 lb 251 lb  Weight (kg) 121.6 kg 121.564 kg 113.853 kg      Telemetry    SR   1 short burst SVT  - Personally Reviewed  ECG    No new- Personally Reviewed  Physical Exam   GEN: No acute distress.   Neck: No JVD Cardiac: RRR  GR III/VI sys murmur base Respiratory: Clear to auscultation bilaterally. GI: Soft, nontender, non-distended  MS: No edema; No deformity. Neuro:  Nonfocal  Psych: Normal affect   Labs    High Sensitivity Troponin:   Recent Labs  Lab 08/25/21 1846 08/25/21 2054 08/26/21 0102  TROPONINIHS 112* 371*   359* 742*     Chemistry Recent Labs  Lab 08/25/21 1846 08/25/21 1900 08/25/21 2054 08/26/21 0102 08/26/21 0344 08/27/21 0213 08/28/21 0229  NA 138   < >  --    < > 137 140 140  K 2.9*   < >  --    < > 4.4  3.5 3.9  CL 103   < >  --    < > 104 106 104  CO2 27  --   --    < > 26 27 28   GLUCOSE 134*   < >  --    < > 108* 97 110*  BUN 15   < >  --    < > 13 9 7   CREATININE 0.77   < >  --    < > 0.48 0.50 0.48  CALCIUM 9.8  --   --    < > 8.7* 9.1 9.5  MG 2.5*  --  2.2  --  2.1  --   --   PROT  --   --  7.1  --  6.2*  --   --   ALBUMIN  --   --  3.5  --  3.2*  --   --   AST  --   --  72*  --  55*  --   --   ALT  --   --  63*  --  52*  --   --   ALKPHOS  --   --  101  --  83  --   --   BILITOT  --   --  0.4  --  0.6  --   --   GFRNONAA >60  --   --    < > >60 >60 >60  ANIONGAP 8  --   --    < > 7 7 8    < > = values in this interval not displayed.    Lipids  Recent Labs  Lab 08/26/21 0344  CHOL 174  TRIG 21  HDL 62  LDLCALC 108*  CHOLHDL 2.8    Hematology Recent Labs  Lab 08/26/21 0344 08/27/21 0213 08/28/21 0229  WBC 10.6* 8.5 6.1  RBC 3.51* 3.79* 3.88  HGB 10.6* 11.3* 11.6*  HCT 34.1* 36.0 36.3  MCV 97.2 95.0 93.6  MCH 30.2 29.8 29.9  MCHC 31.1 31.4 32.0  RDW 13.2 13.0 12.7  PLT 208 219 200   Thyroid  Recent Labs  Lab 08/26/21 0344  TSH 0.375    BNP Recent Labs  Lab 08/26/21 0102  BNP 100.5*    DDimer  Recent Labs  Lab 08/25/21 2054  DDIMER 5.82*     Radiology    ECHOCARDIOGRAM COMPLETE  Result Date: 08/26/2021    ECHOCARDIOGRAM REPORT   Patient Name:   OLEVA KOO [facility] Date of Exam: 08/26/2021 Medical Rec #:  [id number]        Height:       65.0 in Accession #:    [id number]       Weight:       268.1 lb Date of Birth:  [birth year]         BSA:          2.240 m Patient Age:    61 years         BP:           105/68 mmHg Patient Gender: F                HR:           79 bpm. Exam Location:  Inpatient Procedure: 2D Echo, Cardiac Doppler, Color Doppler and Intracardiac            Opacification Agent Indications:    Syncope  History:        Patient has prior history of Echocardiogram examinations, most                 recent 12/22/2020. Endocarditis, Arrythmias:Atrial  Fibrillation;                 Signs/Symptoms:Syncope.  Sonographer:    Glo Herring Referring Phys: [id number] [place]  1. Left ventricular ejection fraction, by estimation, is 55 to 60%. Left ventricular ejection fraction by PLAX is 61 %. The left ventricle has normal function. The left ventricle has no regional wall motion abnormalities. There is mild concentric left ventricular hypertrophy. Left ventricular diastolic parameters are consistent with Grade II diastolic dysfunction (pseudonormalization). Elevated left ventricular end-diastolic pressure.  2. Right ventricular systolic function is normal. The right ventricular size is normal. There is normal pulmonary artery systolic pressure.  3. Left atrial size was mildly dilated.  4. The mitral valve is normal in structure. No evidence of mitral valve regurgitation. No evidence of mitral stenosis.  5. Aortic valve gradients unchanged from 12/2020. The aortic valve is tricuspid. There is moderate calcification of the aortic valve. There is mild thickening of the aortic valve. Aortic valve regurgitation is mild. Moderate  to severe aortic valve stenosis. Aortic valve area, by VTI measures 0.82 cm. Aortic valve mean gradient measures 33.0 mmHg. Aortic valve Vmax measures 3.81 m/s.  6. The inferior vena cava is dilated in size with <50% respiratory variability, suggesting right atrial pressure of 15 mmHg. FINDINGS  Left Ventricle: Left ventricular ejection fraction, by estimation, is 55 to 60%. Left ventricular ejection fraction by PLAX is 61 %. The left ventricle has normal function. The left ventricle has no regional wall motion abnormalities. The left ventricular internal cavity size was normal in size. There is mild concentric left ventricular hypertrophy. Left ventricular diastolic parameters are consistent with Grade II diastolic dysfunction (pseudonormalization). Elevated left ventricular end-diastolic pressure. Right Ventricle: The right  ventricular size is normal. No increase in right ventricular wall thickness. Right ventricular systolic function is normal. There is normal pulmonary artery systolic pressure. The tricuspid regurgitant velocity is 2.12 m/s, and  with an assumed right atrial pressure of 15 mmHg, the estimated right ventricular systolic pressure is 63.8 mmHg. Left Atrium: Left atrial size was mildly dilated. Right Atrium: Right atrial size was normal in size. Pericardium: There is no evidence of pericardial effusion. Mitral Valve: The mitral valve is normal in structure. No evidence of mitral valve regurgitation. No evidence of mitral valve stenosis. Tricuspid Valve: The tricuspid valve is normal in structure. Tricuspid valve regurgitation is mild . No evidence of tricuspid stenosis. Aortic Valve: Aortic valve gradients unchanged from 12/2020. The aortic valve is tricuspid. There is moderate calcification of the aortic valve. There is mild thickening of the aortic valve. Aortic valve regurgitation is mild. Moderate to severe aortic stenosis is present. Aortic valve mean gradient measures 33.0 mmHg. Aortic valve peak gradient measures 58.1 mmHg. Aortic valve area, by VTI measures 0.82 cm. Pulmonic Valve: The pulmonic valve was normal in structure. Pulmonic valve regurgitation is not visualized. No evidence of pulmonic stenosis. Aorta: The aortic root is normal in size and structure. Venous: The inferior vena cava is dilated in size with less than 50% respiratory variability, suggesting right atrial pressure of 15 mmHg. IAS/Shunts: No atrial level shunt detected by color flow Doppler.  LEFT VENTRICLE PLAX 2D LV EF:         Left            Diastology                ventricular     LV e' medial:    6.57 cm/s                ejection        LV E/e' medial:  17.4                fraction by     LV e' lateral:   7.62 cm/s                PLAX is 61      LV E/e' lateral: 15.0                %. LVIDd:         5.40 cm LVIDs:         3.60 cm LV PW:          1.20 cm LV IVS:        1.20 cm LVOT diam:     2.10 cm LV SV:         72 LV SV Index:   32 LVOT Area:     3.46 cm  LV Volumes (MOD) LV vol d, MOD    143.0 ml A2C: LV vol d, MOD    159.0 ml A4C: LV vol s, MOD    52.0 ml A2C: LV vol s, MOD    52.1 ml A4C: LV SV MOD A2C:   91.0 ml LV SV MOD A4C:   159.0 ml LV SV MOD BP:    99.9 ml RIGHT VENTRICLE            IVC RV S prime:     8.68 cm/s  IVC diam: 2.70 cm LEFT ATRIUM             Index LA diam:        4.10 cm 1.83 cm/m LA Vol (A2C):   51.1 ml 22.81 ml/m LA Vol (A4C):   73.7 ml 32.90 ml/m LA Biplane Vol: 62.4 ml 27.85 ml/m  AORTIC VALVE                     PULMONIC VALVE AV Area (Vmax):    0.81 cm      PV Vmax:       1.29 m/s AV Area (Vmean):   0.82 cm      PV Peak grad:  6.7 mmHg AV Area (VTI):     0.82 cm AV Vmax:           381.00 cm/s AV Vmean:          270.600 cm/s AV VTI:            0.884 m AV Peak Grad:      58.1 mmHg AV Mean Grad:      33.0 mmHg LVOT Vmax:         88.70 cm/s LVOT Vmean:        64.250 cm/s LVOT VTI:          0.209 m LVOT/AV VTI ratio: 0.24  AORTA Ao Root diam: 2.90 cm Ao Asc diam:  3.30 cm MITRAL VALVE                TRICUSPID VALVE MV Area (PHT): 4.80 cm     TR Peak grad:   18.0 mmHg MV Decel Time: 158 msec     TR Vmax:        212.00 cm/s MV E velocity: 114.00 cm/s MV A velocity: 92.60 cm/s   SHUNTS MV E/A ratio:  1.23         Systemic VTI:  0.21 m                             Systemic Diam: 2.10 cm Skeet Latch MD Electronically signed by Skeet Latch MD Signature Date/Time: 08/26/2021/5:11:21 PM    Final    VAS [place] LOWER EXTREMITY VENOUS (DVT)  Result Date: 08/26/2021  Lower Venous DVT Study Patient Name:  DAREEN GUTZWILLER [facility]  Date of Exam:   08/26/2021 Medical Rec #: [id number]         Accession #:    [id number] Date of Birth: 1960/08/26          Patient Gender: F Patient Age:   61 years Exam Location:  [facility] Procedure:      VAS [place] LOWER EXTREMITY VENOUS (DVT) Referring Phys: Nyoka Lint DOUTOVA  --------------------------------------------------------------------------------  Indications: Edema.  Limitations: Body habitus and poor ultrasound/tissue interface. Comparison Study: No prior study Performing Technologist: Maudry Mayhew MHA, RDMS, RVT, RDCS  Examination Guidelines: A complete evaluation includes  B-mode imaging, spectral Doppler, color Doppler, and power Doppler as needed of all accessible portions of each vessel. Bilateral testing is considered an integral part of a complete examination. Limited examinations for reoccurring indications may be performed as noted. The reflux portion of the exam is performed with the patient in reverse Trendelenburg.  +---------+---------------+---------+-----------+----------+--------------+  RIGHT     Compressibility Phasicity Spontaneity Properties Thrombus Aging  +---------+---------------+---------+-----------+----------+--------------+  CFV       Full            Yes       Yes                                    +---------+---------------+---------+-----------+----------+--------------+  SFJ       Full                                                             +---------+---------------+---------+-----------+----------+--------------+  FV Prox   Full                                                             +---------+---------------+---------+-----------+----------+--------------+  FV Mid    Full                                                             +---------+---------------+---------+-----------+----------+--------------+  FV Distal Full                                                             +---------+---------------+---------+-----------+----------+--------------+  PFV       Full                                                             +---------+---------------+---------+-----------+----------+--------------+  POP       Full            Yes       Yes                                     +---------+---------------+---------+-----------+----------+--------------+  PTV       Full                                                             +---------+---------------+---------+-----------+----------+--------------+  PERO      Full                                                             +---------+---------------+---------+-----------+----------+--------------+   +---------+---------------+---------+-----------+----------+--------------+  LEFT      Compressibility Phasicity Spontaneity Properties Thrombus Aging  +---------+---------------+---------+-----------+----------+--------------+  CFV       Full            Yes       Yes                                    +---------+---------------+---------+-----------+----------+--------------+  SFJ       Full                                                             +---------+---------------+---------+-----------+----------+--------------+  FV Prox   Full                                                             +---------+---------------+---------+-----------+----------+--------------+  FV Mid    Full                                                             +---------+---------------+---------+-----------+----------+--------------+  FV Distal Full                                                             +---------+---------------+---------+-----------+----------+--------------+  PFV       Full                                                             +---------+---------------+---------+-----------+----------+--------------+  POP       Full            Yes       Yes                                    +---------+---------------+---------+-----------+----------+--------------+  PTV       Full                                                             +---------+---------------+---------+-----------+----------+--------------+  PERO      Full                                                              +---------+---------------+---------+-----------+----------+--------------+     Summary: RIGHT: - There is no evidence of deep vein thrombosis in the lower extremity.  - No cystic structure found in the popliteal fossa.  LEFT: - There is no evidence of deep vein thrombosis in the lower extremity.  - No cystic structure found in the popliteal fossa.  *See table(s) above for measurements and observations. Electronically signed by Orlie Pollen on 08/26/2021 at 67:57:53 PM.    Final     Cardiac Studies   Echo  08/26/21  Left ventricular ejection fraction, by estimation, is 55 to 60%. Left ventricular ejection fraction by PLAX is 61 %. The left ventricle has normal function. The left ventricle has no regional wall motion abnormalities. There is mild concentric left ventricular hypertrophy. Left ventricular diastolic parameters are consistent with Grade II diastolic dysfunction (pseudonormalization). Elevated left ventricular end-diastolic pressure. 1. Right ventricular systolic function is normal. The right ventricular size is normal. There is normal pulmonary artery systolic pressure. 2. 3. Left atrial size was mildly dilated. The mitral valve is normal in structure. No evidence of mitral valve regurgitation. No evidence of mitral stenosis. 4. Aortic valve gradients unchanged from 12/2020. The aortic valve is tricuspid. There is moderate calcification of the aortic valve. There is mild thickening of the aortic valve. Aortic valve regurgitation is mild. Moderate to severe aortic valve stenosis. Aortic valve area, by VTI measures 0.82 cm. Aortic valve mean gradient measures 33.0 mmHg. Aortic valve Vmax measures 3.81 m/s. 5. The inferior vena cava is dilated in size with <50% respiratory variability, suggesting right atrial pressure of 15 mmHg.  Patient Profile     61 y.o. female with history of moderate AS, HTN, morbid obesity and chronic pain on narcotics admitted following syncopal event,  possible PEA arrest.   Assessment & Plan   Synope/PEA arrest Pt found down at home    No prodrome   Pt active   Does use pain meds prn but she says not abusive  (at least 4 per day though) Epi and CPR performed   Pt reported to give 1 dose of Narcan Became combative.  I have reviewed echo from this admit   AV is thickend, calcified   Difficult to see well   Does appear to have restricted in motion   Mean gradient is 33 mm Hg  SUgg mod AS   But dimensionless index is 0.24 consistent with severe AS       TEE from the spring  Images show AV trileaflet withrestricted motion but it does not appear severe at all  Current spell may have been multifactoral    Narcotic use (hypixa, hypotension) in setting of low po intake in setting of moderate AS    I am not convinced, after review of echo and TEE that valve is sole contributer to above    Will of course need to be followed.  Esp in setting of chronic infection     Again, I reiterated the  importance of hydration.    Follow symptoms    Will set up for monitor though I do not think it  is a primary arrhythmogenic event  Will make sure she has outpt follow up   AS   As above   Mean gradient 33 mm Hg   DI 0.24  Pt very active she says (works 6 days per week)    Recomm:  Activities as tolerated   Needs close follow up, coordination of services (ID)  Trop elevation Probably due to subendocardial ischemia in setting of event  and AS   Atrial fibrillation post arrest:   Follow   Remains in SR No indication for anticoagulation       ID   Follows patient in outpt setting   Pt needs to follow as one day will probably need valve replacement      Ambulate today  Follow symptoms  For questions or updates, please contact [place] Please consult [email or web address] for contact info under        Signed, Dorris Carnes, MD  08/28/2021, 8:02 AM

## 2021-08-28 NOTE — Discharge Summary (Signed)
Physician Discharge Summary  Nicole Hines PFX:[id number] DOB: 1961/01/22 DOA: 08/25/2021  PCP: [name], MD  Admit date: 08/25/2021 Discharge date: 08/28/2021  Time spent: 35 minutes  Recommendations for Outpatient Follow-up:  PCP in 1 week Cardiology FU in 1 month Needs Non urgent GI FU-concern for liver liver disease?  Cirrhosis noted on   Discharge Diagnoses:  Principal Problem:   Syncope Aortic stenosis History of MRSA left SI joint septic arthritis   Morbid obesity (HCC)   MRSA (methicillin resistant Staphylococcus aureus) carrier   Septic joint (HCC)   Asthma, chronic, unspecified asthma severity, with acute exacerbation   Opioid overdose (HCC)   Atrial fibrillation with RVR (HCC)   Other cirrhosis of liver (HCC)   Leukocytosis   Prolonged QT interval   Acute metabolic encephalopathy   Sepsis ([place])   PEA (Pulseless electrical activity) (HCC)   Syncope and collapse   Elevated troponin   Acute respiratory failure with hypoxia ([place])   Discharge Condition: Stable  Diet recommendation: Low-sodium, heart healthy  Filed Weights   08/25/21 1833  Weight: 121.6 kg    History of present illness:  60/F with history of MRSA bacteremia with septic SI joint and 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 In the emergency room she was noted to be hypokalemic, troponin was 112, head CT,  COVID PCR were negative, CTA chest -no pulmonary embolism, noted atelectasis, CT abdomen noted constipation, ? cirrhosis, CT  Hospital Course:   Syncope/ Possible PEA arrest  Aortic stenosis, moderate to severe -Was found down poorly responsive and bradycardic, per EMS documentation, concern for PEA status post CPR X 8 minutes and 1 round of epi  -Unclear if this was true PEA arrest versus syncope -Clinically do not suspect true ACS, troponins minimally elevated  -CTA chest negative for pulmonary embolism,  -2D echo with at least moderate aortic stenosis, could have contributed to above -Seen by cardiology in consultation who felt that her event was most likely consistent with syncope, they felt that her episode could have been multifactorial, from dehydration, narcotic use, hypoxia, possibly hypotension in the setting of moderate AS.  Patient is educated on importance of hydration, cardiology will arrange follow-up, she is on chronic narcotics for 5 to 10 years, denies excessive use. 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   Morbid obesity  Discharge Exam: Vitals:   08/27/21 1909 08/28/21 0510  BP:  132/68 132/61  Pulse: 77 68  Resp: 19 16  Temp: 98.2 F (36.8 C) 98.3 F (36.8 C)  SpO2: 95% 95%    General exam: Obese chronically ill female, sitting up  in bed, AAOx3, no distress HEENT: No JVD CVS: S1-S2, regular rhythm, systolic murmur Lungs: Decreased breath sounds to bases otherwise clear Abdomen: Soft, nontender, bowel sounds present Extremities: No edema  Skin: No rash on exposed skin Psych: Appropriate mood and affect Neuro: Moves all extremities, no localizing signs  Discharge Instructions   Discharge Instructions     Diet - low sodium heart healthy   Complete by: As directed    Increase activity slowly   Complete by: As directed       Allergies as of 08/28/2021   No Known Allergies      Medication List     STOP taking these medications    amLODipine 5 MG tablet Commonly known as: NORVASC   docusate sodium 100 MG capsule Commonly known as: Colace   senna-docusate 8.6-50 MG tablet Commonly known as: Senokot-S       TAKE these medications    albuterol 108 (90 Base) MCG/ACT inhaler Commonly known as: VENTOLIN HFA Inhale 2 puffs into the lungs every 4 (four) hours as needed for shortness of breath or wheezing.   ALPRAZolam 0.5 MG tablet Commonly known as: XANAX Take 1 mg by mouth at bedtime. For anxiety   CALCIUM 600+D3 PO Take 1 tablet by mouth 2 (two) times daily.   citalopram 40 MG tablet Commonly known as: CELEXA Take 40 mg by mouth every morning.   clobetasol cream 0.05 % Commonly known as: TEMOVATE Apply 1 application topically at bedtime. Around lips   doxycycline 100 MG tablet Commonly known as: VIBRA-TABS Take 1 tablet (100 mg total) by mouth 2 (two) times daily. What changed: additional instructions   fish oil-omega-3 fatty acids 1000 MG capsule Take 1 g by mouth 2 (two) times daily.   Fluticasone-Salmeterol 250-50 MCG/DOSE Aepb Commonly known as: ADVAIR Inhale 2 puffs into the lungs 2 (two) times daily as needed (sob/wheezing).   gabapentin 100 MG capsule Commonly known as: NEURONTIN Take 300 mg by mouth at bedtime.   GLUCOSAMINE 1500 COMPLEX PO Take 1 capsule by mouth 2  (two) times daily.   methocarbamol 750 MG tablet Commonly known as: ROBAXIN Take 750 mg by mouth 3 (three) times daily as needed for muscle spasms. What changed: Another medication with the same name was removed. Continue taking this medication, and follow the directions you see here.   montelukast 10 MG tablet Commonly known as: SINGULAIR Take 10 mg by mouth every morning.   multivitamin with minerals Tabs tablet Take 1 tablet by mouth daily.   omeprazole 20 MG capsule Commonly known as: PRILOSEC Take 1 capsule (20 mg total) by mouth daily before breakfast.   oxyCODONE-acetaminophen 10-325 MG tablet Commonly known as: PERCOCET Take 1 tablet by mouth every 6 (six) hours as needed for pain.   polyethylene glycol 17 g packet Commonly known as: MIRALAX / GLYCOLAX Take 17 g by mouth daily as needed for severe constipation.   telmisartan 40 MG tablet Commonly known as: MICARDIS Take 40 mg by mouth daily.   vitamin B-12 1000 MCG tablet Commonly known as: CYANOCOBALAMIN Take 1,000 mcg by mouth daily.       No Known Allergies    The results of significant diagnostics from this hospitalization (including imaging, microbiology, ancillary and laboratory) are listed below for reference.  Significant Diagnostic Studies: CT Head Wo Contrast  Result Date: 08/25/2021 CLINICAL DATA:  Mental status change, unknown cause EXAM: CT HEAD WITHOUT CONTRAST TECHNIQUE: Contiguous axial images were obtained from the base of the skull through the vertex without intravenous contrast. COMPARISON:  03/29/2006. FINDINGS: Brain: No evidence of acute infarction, hemorrhage, cerebral edema, mass, mass effect, or midline shift. No hydrocephalus or extra-axial fluid collection. Vascular: No hyperdense vessel. Skull: Normal. Negative for fracture or focal lesion. Sinuses/Orbits: Mucous retention cyst in the left frontal sinus. Mild mucosal thickening in the ethmoid air cells. The orbits are unremarkable.  Other: The mastoid air cells are well aerated. IMPRESSION: IMPRESSION No acute intracranial process. Electronically Signed   By: Merilyn Baba M.D.   On: 08/25/2021 20:00   DG CHEST PORT 1 VIEW  Result Date: 08/26/2021 CLINICAL DATA:  Respiratory failure with hypoxia. EXAM: PORTABLE CHEST 1 VIEW COMPARISON:  08/25/2021. FINDINGS: The heart is enlarged and the mediastinal contour stable. The pulmonary vasculature is mildly distended. Minimal atelectasis is noted at the lung bases. No consolidation, effusion, or pneumothorax. No acute osseous abnormality. IMPRESSION: 1. Cardiomegaly with mildly distended pulmonary vasculature. 2. Mild atelectasis at the lung bases. Electronically Signed   By: Brett Fairy M.D.   On: 08/26/2021 02:22   DG Chest Portable 1 View  Result Date: 08/25/2021 CLINICAL DATA:  CPR. EXAM: PORTABLE CHEST 1 VIEW COMPARISON:  Chest radiograph 01/17/2021 FINDINGS: Chronic cardiomegaly. Unchanged mediastinal contours allowing for differences in technique. No pneumothorax or pulmonary edema. Bibasilar atelectasis, left greater than right. No pleural effusion. On limited evaluation, no acute osseous abnormalities are seen. IMPRESSION: Chronic cardiomegaly. Bibasilar atelectasis. Electronically Signed   By: Keith Rake M.D.   On: 08/25/2021 18:47   ECHOCARDIOGRAM COMPLETE  Result Date: 08/26/2021    ECHOCARDIOGRAM REPORT   Patient Name:   Nicole Hines [facility] Date of Exam: 08/26/2021 Medical Rec #:  [id number]        Height:       65.0 in Accession #:    [id number]       Weight:       268.1 lb Date of Birth:  [birth year]         BSA:          2.240 m Patient Age:    35 years         BP:           105/68 mmHg Patient Gender: F                HR:           79 bpm. Exam Location:  Inpatient Procedure: 2D Echo, Cardiac Doppler, Color Doppler and Intracardiac            Opacification Agent Indications:    Syncope  History:        Patient has prior history of Echocardiogram examinations, most                  recent 12/22/2020. Endocarditis, Arrythmias:Atrial Fibrillation;                 Signs/Symptoms:Syncope.  Sonographer:    Glo Herring Referring Phys: [id number] [place]  1. Left ventricular ejection fraction, by estimation, is 55 to 60%. Left ventricular ejection fraction by PLAX is 61 %. The left ventricle has normal function. The left ventricle has no regional wall motion abnormalities. There is mild concentric left ventricular hypertrophy. Left ventricular diastolic parameters are consistent with Grade II diastolic  dysfunction (pseudonormalization). Elevated left ventricular end-diastolic pressure.  2. Right ventricular systolic function is normal. The right ventricular size is normal. There is normal pulmonary artery systolic pressure.  3. Left atrial size was mildly dilated.  4. The mitral valve is normal in structure. No evidence of mitral valve regurgitation. No evidence of mitral stenosis.  5. Aortic valve gradients unchanged from 12/2020. The aortic valve is tricuspid. There is moderate calcification of the aortic valve. There is mild thickening of the aortic valve. Aortic valve regurgitation is mild. Moderate to severe aortic valve stenosis. Aortic valve area, by VTI measures 0.82 cm. Aortic valve mean gradient measures 33.0 mmHg. Aortic valve Vmax measures 3.81 m/s.  6. The inferior vena cava is dilated in size with <50% respiratory variability, suggesting right atrial pressure of 15 mmHg. FINDINGS  Left Ventricle: Left ventricular ejection fraction, by estimation, is 55 to 60%. Left ventricular ejection fraction by PLAX is 61 %. The left ventricle has normal function. The left ventricle has no regional wall motion abnormalities. The left ventricular internal cavity size was normal in size. There is mild concentric left ventricular hypertrophy. Left ventricular diastolic parameters are consistent with Grade II diastolic dysfunction (pseudonormalization). Elevated left ventricular  end-diastolic pressure. Right Ventricle: The right ventricular size is normal. No increase in right ventricular wall thickness. Right ventricular systolic function is normal. There is normal pulmonary artery systolic pressure. The tricuspid regurgitant velocity is 2.12 m/s, and  with an assumed right atrial pressure of 15 mmHg, the estimated right ventricular systolic pressure is 47.4 mmHg. Left Atrium: Left atrial size was mildly dilated. Right Atrium: Right atrial size was normal in size. Pericardium: There is no evidence of pericardial effusion. Mitral Valve: The mitral valve is normal in structure. No evidence of mitral valve regurgitation. No evidence of mitral valve stenosis. Tricuspid Valve: The tricuspid valve is normal in structure. Tricuspid valve regurgitation is mild . No evidence of tricuspid stenosis. Aortic Valve: Aortic valve gradients unchanged from 12/2020. The aortic valve is tricuspid. There is moderate calcification of the aortic valve. There is mild thickening of the aortic valve. Aortic valve regurgitation is mild. Moderate to severe aortic stenosis is present. Aortic valve mean gradient measures 33.0 mmHg. Aortic valve peak gradient measures 58.1 mmHg. Aortic valve area, by VTI measures 0.82 cm. Pulmonic Valve: The pulmonic valve was normal in structure. Pulmonic valve regurgitation is not visualized. No evidence of pulmonic stenosis. Aorta: The aortic root is normal in size and structure. Venous: The inferior vena cava is dilated in size with less than 50% respiratory variability, suggesting right atrial pressure of 15 mmHg. IAS/Shunts: No atrial level shunt detected by color flow Doppler.  LEFT VENTRICLE PLAX 2D LV EF:         Left            Diastology                ventricular     LV e' medial:    6.57 cm/s                ejection        LV E/e' medial:  17.4                fraction by     LV e' lateral:   7.62 cm/s                PLAX is 61      LV E/e' lateral: 15.0                %.  LVIDd:         5.40 cm LVIDs:         3.60 cm LV PW:         1.20 cm LV IVS:        1.20 cm LVOT diam:     2.10 cm LV SV:         72 LV SV Index:   32 LVOT Area:     3.46 cm  LV Volumes (MOD) LV vol d, MOD    143.0 ml A2C: LV vol d, MOD    159.0 ml A4C: LV vol s, MOD    52.0 ml A2C: LV vol s, MOD    52.1 ml A4C: LV SV MOD A2C:   91.0 ml LV SV MOD A4C:   159.0 ml LV SV MOD BP:    99.9 ml RIGHT VENTRICLE            IVC RV S prime:     8.68 cm/s  IVC diam: 2.70 cm LEFT ATRIUM             Index LA diam:        4.10 cm 1.83 cm/m LA Vol (A2C):   51.1 ml 22.81 ml/m LA Vol (A4C):   73.7 ml 32.90 ml/m LA Biplane Vol: 62.4 ml 27.85 ml/m  AORTIC VALVE                     PULMONIC VALVE AV Area (Vmax):    0.81 cm      PV Vmax:       1.29 m/s AV Area (Vmean):   0.82 cm      PV Peak grad:  6.7 mmHg AV Area (VTI):     0.82 cm AV Vmax:           381.00 cm/s AV Vmean:          270.600 cm/s AV VTI:            0.884 m AV Peak Grad:      58.1 mmHg AV Mean Grad:      33.0 mmHg LVOT Vmax:         88.70 cm/s LVOT Vmean:        64.250 cm/s LVOT VTI:          0.209 m LVOT/AV VTI ratio: 0.24  AORTA Ao Root diam: 2.90 cm Ao Asc diam:  3.30 cm MITRAL VALVE                TRICUSPID VALVE MV Area (PHT): 4.80 cm     TR Peak grad:   18.0 mmHg MV Decel Time: 158 msec     TR Vmax:        212.00 cm/s MV E velocity: 114.00 cm/s MV A velocity: 92.60 cm/s   SHUNTS MV E/A ratio:  1.23         Systemic VTI:  0.21 m                             Systemic Diam: 2.10 cm Skeet Latch MD Electronically signed by Skeet Latch MD Signature Date/Time: 08/26/2021/5:11:21 PM    Final    CT Angio Chest/Abd/Pel for Dissection W and/or Wo Contrast  Result Date: 08/25/2021 CLINICAL DATA:  Aortic aneurysm, known or suspected Post CPR. EXAM: CT ANGIOGRAPHY CHEST, ABDOMEN AND PELVIS TECHNIQUE: Non-contrast CT of the chest was initially obtained. Multidetector CT imaging through the  chest, abdomen and pelvis was performed using the standard protocol during  bolus administration of intravenous contrast. Multiplanar reconstructed images and MIPs were obtained and reviewed to evaluate the vascular anatomy. CONTRAST:  169m OMNIPAQUE IOHEXOL 350 MG/ML SOLN COMPARISON:  Chest radiograph earlier today. Abdominopelvic CT 12/18/2020. No prior chest CT available. FINDINGS: CTA CHEST FINDINGS Cardiovascular: No aortic hematoma noncontrast exam. The thoracic aorta is tortuous but normal in caliber. No aortic aneurysm or dissection. Conventional branching pattern from the aortic arch exam not tailored for evaluation of pulmonary arteries. No obvious central pulmonary embolus. The heart is upper normal in size. Trace pericardial effusion. Subcentimeter lobulated low-density adjacent to the descending aorta in the lower chest was present on prior exam, may represent lymphatic malformation or dilatation of the as azygous/semi azygous system. Mediastinum/Nodes: No enlarged mediastinal, hilar, or axillary lymph nodes. Patulous distal esophagus with small hiatal hernia. No thyroid nodule. Lungs/Pleura: No pneumothorax. Minimal retained mucus within the trachea and central bronchi. Minor subsegmental atelectasis in the right greater than left lower lobe. No pleural effusion or findings of pulmonary edema. Musculoskeletal: No definite rib fracture, mild motion artifact limits detailed rib assessment. No sternal fracture. Diffuse thoracic spondylosis with multilevel endplate spurring. No confluent chest wall contusion. Review of the MIP images confirms the above findings. CTA ABDOMEN AND PELVIS FINDINGS VASCULAR Aorta: Normal caliber abdominal aorta. No aneurysm. No dissection or acute findings. Celiac: Patent without evidence of aneurysm, dissection, vasculitis or significant stenosis. SMA: Patent without evidence of aneurysm, dissection, vasculitis or significant stenosis. Renals: Both renal arteries are patent without evidence of aneurysm, dissection, vasculitis, fibromuscular dysplasia  or significant stenosis. IMA: Patent. Inflow: Patent without evidence of aneurysm, dissection, vasculitis or significant stenosis. Veins: No obvious venous abnormality within the limitations of this arterial phase study. Review of the MIP images confirms the above findings. NON-VASCULAR Hepatobiliary: Suggestion of decreased hepatic density and micro nodular hepatic contours. No focal hepatic lesion on this arterial phase exam. Cholecystectomy with surgical clips in the gallbladder fossa. Probable dropped clips adjacent to the inferior liver edge. Common bile duct is not well-defined, no evidence of biliary dilatation. Pancreas: Fatty atrophy.  No ductal dilatation or inflammation. Spleen: Normal in size and arterial enhancement. Adrenals/Urinary Tract: No adrenal nodule. Question of heterogeneous bilateral renal enhancement versus artifact from arms down positioning and habitus. No hydronephrosis or focal renal abnormality. Distended urinary bladder without wall thickening. No visualized urolithiasis. Stomach/Bowel: Small hiatal hernia. No bowel obstruction or inflammation. Moderate stool burden with moderate stool distending the rectum. Normal appendix. Lymphatic: Scattered small retroperitoneal lymph nodes are not enlarged by size criteria. No enlarged lymph nodes in the abdomen or pelvis. Reproductive: Quiescent uterus, deviating into the right pelvis. No adnexal mass. Other: No free air or ascites.  No abdominal wall hernia. Musculoskeletal: Multilevel degenerative change in the spine. 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 Review of the MIP images confirms the above findings. IMPRESSION: 1. No aortic dissection or acute aortic abnormality. No aneurysm of the thoracoabdominal aorta. 2. Question of heterogeneous bilateral renal enhancement versus artifact from arms down positioning and habitus. Recommend correlation with  urinalysis. 3. Patulous distal esophagus with small hiatal hernia. 4. Suggestion of decreased hepatic density and micro nodular hepatic contours, suggesting cirrhosis. Recommend correlation with cirrhosis risk factors 5. Moderate stool burden with moderate stool distending the rectum, suggesting constipation. Aortic Atherosclerosis (ICD10-I70.0). Electronically Signed   By: MAurther LoftD.  On: 08/25/2021 20:13   VAS [place] LOWER EXTREMITY VENOUS (DVT)  Result Date: 08/26/2021  Lower Venous DVT Study Patient Name:  Nicole Hines [facility]  Date of Exam:   08/26/2021 Medical Rec #: [id number]         Accession #:    [id number] Date of Birth: 12/20/1960          Patient Gender: F Patient Age:   41 years Exam Location:  [facility] Procedure:      VAS [place] LOWER EXTREMITY VENOUS (DVT) Referring Phys: Nyoka Lint DOUTOVA --------------------------------------------------------------------------------  Indications: Edema.  Limitations: Body habitus and poor ultrasound/tissue interface. Comparison Study: No prior study Performing Technologist: Maudry Mayhew MHA, RDMS, RVT, RDCS  Examination Guidelines: A complete evaluation includes B-mode imaging, spectral Doppler, color Doppler, and power Doppler as needed of all accessible portions of each vessel. Bilateral testing is considered an integral part of a complete examination. Limited examinations for reoccurring indications may be performed as noted. The reflux portion of the exam is performed with the patient in reverse Trendelenburg.  +---------+---------------+---------+-----------+----------+--------------+  RIGHT     Compressibility Phasicity Spontaneity Properties Thrombus Aging  +---------+---------------+---------+-----------+----------+--------------+  CFV       Full            Yes       Yes                                    +---------+---------------+---------+-----------+----------+--------------+  SFJ       Full                                                              +---------+---------------+---------+-----------+----------+--------------+  FV Prox   Full                                                             +---------+---------------+---------+-----------+----------+--------------+  FV Mid    Full                                                             +---------+---------------+---------+-----------+----------+--------------+  FV Distal Full                                                             +---------+---------------+---------+-----------+----------+--------------+  PFV       Full                                                             +---------+---------------+---------+-----------+----------+--------------+  POP       Full            Yes       Yes                                    +---------+---------------+---------+-----------+----------+--------------+  PTV       Full                                                             +---------+---------------+---------+-----------+----------+--------------+  PERO      Full                                                             +---------+---------------+---------+-----------+----------+--------------+   +---------+---------------+---------+-----------+----------+--------------+  LEFT      Compressibility Phasicity Spontaneity Properties Thrombus Aging  +---------+---------------+---------+-----------+----------+--------------+  CFV       Full            Yes       Yes                                    +---------+---------------+---------+-----------+----------+--------------+  SFJ       Full                                                             +---------+---------------+---------+-----------+----------+--------------+  FV Prox   Full                                                             +---------+---------------+---------+-----------+----------+--------------+  FV Mid    Full                                                              +---------+---------------+---------+-----------+----------+--------------+  FV Distal Full                                                             +---------+---------------+---------+-----------+----------+--------------+  PFV       Full                                                             +---------+---------------+---------+-----------+----------+--------------+  POP       Full            Yes       Yes                                    +---------+---------------+---------+-----------+----------+--------------+  PTV       Full                                                             +---------+---------------+---------+-----------+----------+--------------+  PERO      Full                                                             +---------+---------------+---------+-----------+----------+--------------+     Summary: RIGHT: - There is no evidence of deep vein thrombosis in the lower extremity.  - No cystic structure found in the popliteal fossa.  LEFT: - There is no evidence of deep vein thrombosis in the lower extremity.  - No cystic structure found in the popliteal fossa.  *See table(s) above for measurements and observations. Electronically signed by Orlie Pollen on 08/26/2021 at 6:57:53 PM.    Final     Microbiology: Recent Results (from the past 240 hour(s))  Resp Panel by RT-PCR (Flu A&B, Covid) Nasopharyngeal Swab     Status: None   Collection Time: 08/25/21  6:22 PM   Specimen: Nasopharyngeal Swab; Nasopharyngeal(NP) swabs in vial transport medium  Result Value Ref Range Status   SARS Coronavirus 2 by RT PCR NEGATIVE NEGATIVE Final    Comment: (NOTE) SARS-CoV-2 target nucleic acids are NOT DETECTED.  The SARS-CoV-2 RNA is generally detectable in upper respiratory specimens during the acute phase of infection. The lowest concentration of SARS-CoV-2 viral copies this assay can detect is 138 copies/mL. A negative result does not preclude SARS-Cov-2 infection and should not be  used as the sole basis for treatment or other patient management decisions. A negative result may occur with  improper specimen collection/handling, submission of specimen other than nasopharyngeal swab, presence of viral mutation(s) within the areas targeted by this assay, and inadequate number of viral copies(<138 copies/mL). A negative result must be combined with clinical observations, patient history, and epidemiological information. The expected result is Negative.  Fact Sheet for Patients:  EntrepreneurPulse.com.au  Fact Sheet for Healthcare Providers:  IncredibleEmployment.be  This test is no t yet approved or cleared by the [place] FDA and  has been authorized for detection and/or diagnosis of SARS-CoV-2 by FDA under an Emergency Use Authorization (EUA). This EUA will remain  in effect (meaning this test can be used) for the duration of the COVID-19 declaration under Section 564(b)(1) of the Act, 21 U.S.C.section 360bbb-3(b)(1), unless the authorization is terminated  or revoked sooner.       Influenza A by PCR NEGATIVE NEGATIVE Final   Influenza B by PCR NEGATIVE NEGATIVE Final    Comment: (NOTE) The Xpert Xpress SARS-CoV-2/FLU/RSV plus assay is intended as an aid in the diagnosis of influenza from Nasopharyngeal swab specimens and should  not be used as a sole basis for treatment. Nasal washings and aspirates are unacceptable for Xpert Xpress SARS-CoV-2/FLU/RSV testing.  Fact Sheet for Patients: EntrepreneurPulse.com.au  Fact Sheet for Healthcare Providers: IncredibleEmployment.be  This test is not yet approved or cleared by the [place] FDA and has been authorized for detection and/or diagnosis of SARS-CoV-2 by FDA under an Emergency Use Authorization (EUA). This EUA will remain in effect (meaning this test can be used) for the duration of the COVID-19 declaration under Section  564(b)(1) of the Act, 21 U.S.C. section 360bbb-3(b)(1), unless the authorization is terminated or revoked.  Performed at [facility], [place] [address], [place], [place] [address]   Blood culture (routine x 2)     Status: None (Preliminary result)   Collection Time: 08/25/21  8:54 PM   Specimen: BLOOD  Result Value Ref Range Status   Specimen Description BLOOD RIGHT ANTECUBITAL  Final   Special Requests   Final    BOTTLES DRAWN AEROBIC AND ANAEROBIC Blood Culture adequate volume   Culture   Final    NO GROWTH 3 DAYS Performed at [facility], [place] [address] [place], [place] [address]    Report Status PENDING  Incomplete  Blood culture (routine x 2)     Status: None (Preliminary result)   Collection Time: 08/25/21  8:54 PM   Specimen: BLOOD  Result Value Ref Range Status   Specimen Description BLOOD RIGHT ANTECUBITAL  Final   Special Requests   Final    BOTTLES DRAWN AEROBIC AND ANAEROBIC Blood Culture adequate volume   Culture   Final    NO GROWTH 3 DAYS Performed at [facility], [place] [address], [place], [place] [address]    Report Status PENDING  Incomplete     Labs: Basic Metabolic Panel: Recent Labs  Lab 08/25/21 1846 08/25/21 1900 08/25/21 2054 08/26/21 0102 08/26/21 0117 08/26/21 0344 08/27/21 0213 08/28/21 0229  NA 138 142  --  136 140 137 140 140  K 2.9* 2.9*  --  4.3 4.6 4.4 3.5 3.9  CL 103 103  --  104  --  104 106 104  CO2 27  --   --  27  --  26 27 28   GLUCOSE 134* 135*  --  109*  --  108* 97 110*  BUN 15 18  --  14  --  13 9 7   CREATININE 0.77 0.60  --  0.53  --  0.48 0.50 0.48  CALCIUM 9.8  --   --  8.6*  --  8.7* 9.1 9.5  MG 2.5*  --  2.2  --   --  2.1  --   --   PHOS  --   --  3.1  --   --  4.0  --   --    Liver Function Tests: Recent Labs  Lab 08/25/21 2054 08/26/21 0344  AST 72* 55*  ALT 63* 52*  ALKPHOS 101 83  BILITOT 0.4 0.6  PROT 7.1 6.2*  ALBUMIN 3.5 3.2*   No results for input(s): LIPASE, AMYLASE in the last  168 hours. Recent Labs  Lab 08/26/21 0102  AMMONIA 27   CBC: Recent Labs  Lab 08/25/21 1846 08/25/21 1900 08/25/21 2054 08/26/21 0117 08/26/21 0344 08/27/21 0213 08/28/21 0229  WBC 20.1*  --  15.0*  --  10.6* 8.5 6.1  NEUTROABS  --   --  12.8*  --  9.0*  --   --  HGB 12.9   < > 11.8* 11.9* 10.6* 11.3* 11.6*  HCT 40.3   < > 37.2 35.0* 34.1* 36.0 36.3  MCV 95.5  --  94.7  --  97.2 95.0 93.6  PLT 259  --  232  --  208 219 200   < > = values in this interval not displayed.   Cardiac Enzymes: Recent Labs  Lab 08/25/21 2054  CKTOTAL 175   BNP: BNP (last 3 results) Recent Labs    08/26/21 0102  BNP 100.5*    ProBNP (last 3 results) No results for input(s): PROBNP in the last 8760 hours.  CBG: Recent Labs  Lab 08/25/21 1849  GLUCAP 129*       Signed:  Domenic Polite MD.  [facility] 08/28/2021, 2:08 PM

## 2021-08-28 NOTE — Plan of Care (Signed)
°  Problem: Education: Goal: Ability to demonstrate management of disease process will improve Outcome: Adequate for Discharge Goal: Ability to verbalize understanding of medication therapies will improve Outcome: Adequate for Discharge Goal: Individualized Educational Video(s) Outcome: Adequate for Discharge   Problem: Activity: Goal: Capacity to carry out activities will improve Outcome: Adequate for Discharge   Problem: Cardiac: Goal: Ability to achieve and maintain adequate cardiopulmonary perfusion will improve Outcome: Adequate for Discharge   Problem: Education: Goal: Knowledge of disease or condition will improve Outcome: Adequate for Discharge Goal: Understanding of medication regimen will improve Outcome: Adequate for Discharge Goal: Individualized Educational Video(s) Outcome: Adequate for Discharge   Problem: Activity: Goal: Ability to tolerate increased activity will improve Outcome: Adequate for Discharge   Problem: Cardiac: Goal: Ability to achieve and maintain adequate cardiopulmonary perfusion will improve Outcome: Adequate for Discharge   Problem: Health Behavior/Discharge Planning: Goal: Ability to safely manage health-related needs after discharge will improve Outcome: Adequate for Discharge

## 2021-08-28 NOTE — Evaluation (Signed)
Physical Therapy Evaluation & Discharge Patient Details Name: Nicole Hines MRN: [id number] DOB: [birth year] Today's Date: 08/28/2021  History of Present Illness  Pt is a 61 y.o. female who presented 08/25/21 after being found unresponsive. CPR was performed.  When EMS arrived the patient was noted to be in PEA arrest. Epi x4 was administered before ROSC was achieved. Post-ROSC rhythm was Afib with RVR then sinus tachycardia. Current spell may have been multifactoral Narcotic use (hypixa, hypotension) in setting of low po intake in setting of moderate AS. PMH:  moderate AS, HTN, morbid obesity and chronic pain on narcotics   Clinical Impression  Pt presents with condition above. PTA, she was independent without an AD, working at a SNF, and living in a house with 3 STE. Pt is functioning at her baseline, displaying appropriate balance, strength, and gait speed. No LOB noted with dynamic gait challenges. HR did rise to 152 bpm with fast gait, educated her to pace self to manage HR and symptoms. All education completed and questions answered. PT will sign off.     Recommendations for follow up therapy are one component of a multi-disciplinary discharge planning process, led by the attending physician.  Recommendations may be updated based on patient status, additional functional criteria and insurance authorization.  Follow Up Recommendations No PT follow up    Assistance Recommended at Discharge None  Patient can return home with the following       Equipment Recommendations None recommended by PT  Recommendations for Other Services       Functional Status Assessment Patient has not had a recent decline in their functional status     Precautions / Restrictions Precautions Precautions: Other (comment) Precaution Comments: monitor HR Restrictions Weight Bearing Restrictions: No      Mobility  Bed Mobility Overal bed mobility: Modified Independent             General bed  mobility comments: Pt performs all bed mobility aspects safely without assistance, HOB elevated slightly.    Transfers Overall transfer level: Independent Equipment used: None               General transfer comment: Able to come to stand safely without assistance or LOB.    Ambulation/Gait Ambulation/Gait assistance: Independent Gait Distance (Feet): 360 Feet Assistive device: None Gait Pattern/deviations: Wide base of support;Step-through pattern Gait velocity: WNL Gait velocity interpretation: >4.37 ft/sec, indicative of normal walking speed   General Gait Details: Pt with wide BOS, likely due to increased body habitus, but no other significant deviations. No LOB even when changing head positions, speeds, directions etc.  Stairs Stairs: Yes Stairs assistance: Supervision Stair Management: Two rails;Alternating pattern;Step to pattern;Forwards Number of Stairs: 8 General stair comments: Descends with step-to pattern and ascends with reciprocal pattern, no LOB, supervision for safety.  Wheelchair Mobility    Modified Rankin (Stroke Patients Only)       Balance Overall balance assessment: No apparent balance deficits (not formally assessed)                                           Pertinent Vitals/Pain Pain Assessment: Faces Faces Pain Scale: Hurts a little bit Pain Location: chest from CPR Pain Descriptors / Indicators: Discomfort;Grimacing;Guarding Pain Intervention(s): Limited activity within patient's tolerance;Monitored during session;Repositioned    Home Living Family/patient expects to be discharged to:: Private residence Living Arrangements: Children Available Help  at Discharge: Family;Available PRN/intermittently Type of Home: House Home Access: Stairs to enter Entrance Stairs-Rails: Can reach both Entrance Stairs-Number of Steps: 3   Home Layout: One level Home Equipment: Conservation officer, nature (2 wheels);BSC/3in1;Grab bars -  tub/shower      Prior Function Prior Level of Function : Independent/Modified Independent             Mobility Comments: Pt works doing Pharmacist, hospital at a SNF. Does not use an AD. x1 fall in last 6 months in which she tripped on something.       Hand Dominance        Extremity/Trunk Assessment   Upper Extremity Assessment Upper Extremity Assessment: Overall WFL for tasks assessed    Lower Extremity Assessment Lower Extremity Assessment: Overall WFL for tasks assessed    Cervical / Trunk Assessment Cervical / Trunk Assessment: Normal  Communication   Communication: No difficulties  Cognition Arousal/Alertness: Awake/alert Behavior During Therapy: WFL for tasks assessed/performed Overall Cognitive Status: Within Functional Limits for tasks assessed                                          General Comments General comments (skin integrity, edema, etc.): HR up to 152 with fast gait, needed cues to slow to reduce with success. Educated pt to monitor symptoms to slow pace as needed    Exercises     Assessment/Plan    PT Assessment Patient does not need any further PT services  PT Problem List         PT Treatment Interventions      PT Goals (Current goals can be found in the Care Plan section)  Acute Rehab PT Goals Patient Stated Goal: to go home PT Goal Formulation: All assessment and education complete, DC therapy Time For Goal Achievement: 08/29/21 Potential to Achieve Goals: Good    Frequency       Co-evaluation               AM-PAC PT "6 Clicks" Mobility  Outcome Measure Help needed turning from your back to your side while in a flat bed without using bedrails?: None Help needed moving from lying on your back to sitting on the side of a flat bed without using bedrails?: None Help needed moving to and from a bed to a chair (including a wheelchair)?: None Help needed standing up from a chair using your arms (e.g., wheelchair or  bedside chair)?: None Help needed to walk in hospital room?: None Help needed climbing 3-5 steps with a railing? : A Little 6 Click Score: 23    End of Session   Activity Tolerance: Patient tolerated treatment well Patient left: in bed;with call bell/phone within reach Nurse Communication: Mobility status PT Visit Diagnosis: Other abnormalities of gait and mobility (R26.89);Pain Pain - part of body:  (chest)    Time: 1856-3149 PT Time Calculation (min) (ACUTE ONLY): 13 min   Charges:   PT Evaluation $PT Eval Low Complexity: 1 Low          Moishe Spice, PT, DPT Acute Rehabilitation Services  Pager: [phone] Office: [phone]   Orvan Falconer 08/28/2021, 1:06 PM

## 2021-08-29 ENCOUNTER — Encounter: Payer: Self-pay | Admitting: *Deleted

## 2021-08-29 ENCOUNTER — Ambulatory Visit (INDEPENDENT_AMBULATORY_CARE_PROVIDER_SITE_OTHER): Payer: PRIVATE HEALTH INSURANCE

## 2021-08-29 DIAGNOSIS — I469 Cardiac arrest, cause unspecified: Secondary | ICD-10-CM

## 2021-08-29 NOTE — Progress Notes (Unsigned)
Enrolled for [employer] to mail a ZIO AT Live Telemetry monitor to patients address on file.  Letter with instructions mailed to patient.

## 2021-08-30 LAB — CULTURE, BLOOD (ROUTINE X 2)
Culture: NO GROWTH
Culture: NO GROWTH
Special Requests: ADEQUATE
Special Requests: ADEQUATE

## 2021-09-01 DIAGNOSIS — I469 Cardiac arrest, cause unspecified: Secondary | ICD-10-CM

## 2021-09-05 ENCOUNTER — Telehealth: Payer: Self-pay | Admitting: General Practice

## 2021-09-05 NOTE — Telephone Encounter (Signed)
New Message:     Patient was supposed to have an appointment on 09-07-21, but it was cancelled. She needs appointment after wearing her 14 days Monitor. She wants to know if she can go back to work before her appointment. If not, she will a new note to return to work please.

## 2021-09-05 NOTE — Telephone Encounter (Signed)
Called patient, she states she was told she needed to follow up after her 14 day monitor. However, patient was recently seen in the hospital, and she needs clearance to go back to work. They advised her at the hospital she could go back, but her work is requesting she be seen for follow up and then advised okay to return. Patient is scheduled still no 01/18- I advised for now to keep this appointment, I would send a message to Denyse Amass, NP to review and we would call her back.  Patient was not aware she would get the monitor, and needs to return to work if possible.   Thank you!

## 2021-09-05 NOTE — Telephone Encounter (Signed)
Called patient, advised of message from NP.  Patient verbalized understanding.

## 2021-09-06 NOTE — Progress Notes (Signed)
Cardiology Office Note:    Date:  09/06/2021   ID:  Nicole Hines, DOB 07/25/1961, MRN [id number]  PCP:  [name], MD   [facility] Cardiologist:  [name], MD      Referring MD: [name], MD   Follow-up for atrial fibrillation with RVR  History of Present Illness:    Nicole Hines is a 61 y.o. female with a hx of acute bacterial endocarditis, atrial fibrillation with RVR, PEA, aortic atherosclerosis, morbid obesity, syncope, prolonged QT interval, and syncope with collapse.  She was admitted to the hospital on 08/26/2021 and discharged on 08/28/2021.  She reported being in her usual state of health on 08/25/2021.  She remembers feeling tired and taking her pain medications.  She recalls not eating very much.  Her son found her unresponsive.  EMS was contacted and CPR was initiated.  There was 8 minutes of CPR performed and 1 epinephrine was given.  She had return of spontaneous circulation and was given Narcan.  She reported being hungry in the emergency department and complained of chronic pain in her hip.  She denied acute symptoms.  Her emergency room lab work showed hypokalemia, troponin of 112.  Her head CT COVID PCR CTA of the chest were all unremarkable.  CT abdomen noted constipation, and possible cirrhosis.  There was concern for PEA arrest.  It was not apparent whether her rhythm was PEA or a syncopal episode.  There was no suspicion for ACS and her troponins were minimally elevated.  Her CTA was negative for pulmonary embolus.  Her echocardiogram showed moderate aortic stenosis.  Cardiology was consulted and felt she had a syncopal episode that could have been multifactorial including dehydration, narcotics, hypoxia, possible hypotension in the setting of moderate AS.  She was discharged home in stable condition and instructed to follow-up with cardiology.  She was noted to have some transient atrial fibrillation following her event.  She was not felt  to require anticoagulation.  A cardiac event monitor was recommended.  She presents to the clinic today for follow-up and states she feels well.  She has had no further episodes of lightheadedness, syncope, and denies palpitations.  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  She had not eaten on the day of her episode and has not been very well-hydrated.  We reviewed her echocardiogram and her aortic stenosis.  I will order a BMP, give her a return to work without restrictions note, give a blood pressure log, and plan follow-up for after her cardiac event monitor with Dr. [name].  Today she denies chest pain, shortness of breath, lower extremity edema, fatigue, palpitations, melena, hematuria, hemoptysis, diaphoresis, weakness, presyncope, syncope, orthopnea, and PND.  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 abdomen   TEE WITHOUT CARDIOVERSION N/A 12/27/2020   Procedure: TRANSESOPHAGEAL ECHOCARDIOGRAM (TEE);  Surgeon: [name], MD;  Location: [facility] ENDOSCOPY;  Service: Cardiovascular;  Laterality: N/A;    Current Medications: No outpatient medications have been marked as taking for the 09/07/21 encounter (Appointment) with [name], NP.     Allergies:   Patient has no known allergies.   Social History   Socioeconomic History   Marital status: Divorced     Spouse name: Not on file   Number of children: Not on file   Years of education: Not on file   Highest education level: Not on file  Occupational History   Not on file  Tobacco Use   Smoking status: Never   Smokeless tobacco: Never  Substance and Sexual Activity   Alcohol use: No   Drug use: No   Sexual activity: Yes    Partners: Female    Birth control/protection: Surgical    Comment: btl  Other Topics Concern   Not on file  Social History Narrative   Not on file   Social Determinants of Health   Financial Resource Strain: Not on file  Food Insecurity: Not on file  Transportation Needs: Not on file  Physical Activity: Not on file  Stress: Not on file  Social Connections: Not on file     Family History: The patient's family history includes Cancer in her maternal aunt and mother; Diabetes in her brother, maternal aunt, and mother; Heart attack in her father and maternal grandmother; Hypertension in her mother. There is no history of Colon cancer.  ROS:   Please see the history of present illness.     All other systems reviewed and are negative.   Risk Assessment/Calculations:           Physical Exam:    VS:  LMP 02/18/2014     Wt Readings from Last 3 Encounters:  08/25/21 268 lb 1.3 oz (121.6 kg)  07/04/21 268 lb (121.6 kg)  05/16/21 251 lb (113.9 kg)     GEN:  Well nourished, well developed in no acute distress HEENT: Normal NECK: No JVD; No carotid bruits LYMPHATICS: No lymphadenopathy CARDIAC: RRR, systolic murmur heard best along right sternal border, rubs, gallops RESPIRATORY:  Clear to auscultation without rales, wheezing or rhonchi  ABDOMEN: Soft, non-tender, non-distended MUSCULOSKELETAL:  No edema; No deformity  SKIN: Warm and dry NEUROLOGIC:  Alert and oriented x 3 PSYCHIATRIC:  Normal affect    EKGs/Labs/Other Studies Reviewed:    The following studies were reviewed today: Echocardiogram 08/26/2021 IMPRESSIONS     1. Left ventricular  ejection fraction, by estimation, is 55 to 60%. Left  ventricular ejection fraction by PLAX is 61 %. The left ventricle has  normal function. The left ventricle has no regional wall motion  abnormalities. There is mild concentric left  ventricular hypertrophy. Left ventricular diastolic parameters are  consistent with Grade II diastolic dysfunction (pseudonormalization).  Elevated left ventricular end-diastolic pressure.   2. Right ventricular systolic function is normal. The right ventricular  size is normal. There is normal pulmonary artery systolic pressure.   3. Left atrial size was mildly dilated.   4. The mitral valve is normal in structure. No evidence of mitral valve  regurgitation. No evidence of mitral stenosis.   5. Aortic valve gradients unchanged from 12/2020. The aortic valve is  tricuspid. There is moderate calcification of the aortic valve. There is  mild thickening of the aortic valve. Aortic  valve regurgitation is mild.  Moderate to severe aortic valve  stenosis. Aortic valve area, by VTI measures 0.82 cm. Aortic valve mean  gradient measures 33.0 mmHg. Aortic valve Vmax measures 3.81 m/s.   6. The inferior vena cava is dilated in size with <50% respiratory  variability, suggesting right atrial pressure of 15 mmHg.   FINDINGS   Left Ventricle: Left ventricular ejection fraction, by estimation, is 55  to 60%. Left ventricular ejection fraction by PLAX is 61 %. The left  ventricle has normal function. The left ventricle has no regional wall  motion abnormalities. The left  ventricular internal cavity size was normal in size. There is mild  concentric left ventricular hypertrophy. Left ventricular diastolic  parameters are consistent with Grade II diastolic dysfunction  (pseudonormalization). Elevated left ventricular  end-diastolic pressure.   Right Ventricle: The right ventricular size is normal. No increase in  right ventricular wall thickness. Right ventricular  systolic function is  normal. There is normal pulmonary artery systolic pressure. The tricuspid  regurgitant velocity is 2.12 m/s, and   with an assumed right atrial pressure of 15 mmHg, the estimated right  ventricular systolic pressure is 24.0 mmHg.   Left Atrium: Left atrial size was mildly dilated.   Right Atrium: Right atrial size was normal in size.   Pericardium: There is no evidence of pericardial effusion.   Mitral Valve: The mitral valve is normal in structure. No evidence of  mitral valve regurgitation. No evidence of mitral valve stenosis.   Tricuspid Valve: The tricuspid valve is normal in structure. Tricuspid  valve regurgitation is mild . No evidence of tricuspid stenosis.   Aortic Valve: Aortic valve gradients unchanged from 12/2020. The aortic  valve is tricuspid. There is moderate calcification of the aortic valve.  There is mild thickening of the aortic valve. Aortic valve regurgitation  is mild. Moderate to severe aortic  stenosis is present. Aortic valve mean gradient measures 33.0 mmHg. Aortic  valve peak gradient measures 58.1 mmHg. Aortic valve area, by VTI measures  0.82 cm.   Pulmonic Valve: The pulmonic valve was normal in structure. Pulmonic valve  regurgitation is not visualized. No evidence of pulmonic stenosis.   Aorta: The aortic root is normal in size and structure.   Venous: The inferior vena cava is dilated in size with less than 50%  respiratory variability, suggesting right atrial pressure of 15 mmHg.   IAS/Shunts: No atrial level shunt detected by color flow Doppler.  EKG: None today.  Recent Labs: 08/26/2021: ALT 52; B Natriuretic Peptide 100.5; Magnesium 2.1; TSH 0.375 08/28/2021: BUN 7; Creatinine, Ser 0.48; Hemoglobin 11.6; Platelets 200; Potassium 3.9; Sodium 140  Recent Lipid Panel    Component Value Date/Time   CHOL 174 08/26/2021 0344   TRIG 21 08/26/2021 0344   HDL 62 08/26/2021 0344   CHOLHDL 2.8 08/26/2021 0344   VLDL 4  08/26/2021 0344   LDLCALC 108 (H) 08/26/2021 0344    ASSESSMENT & PLAN    Syncope/PEA arrest-no further episodes of lightheadedness, dizziness, presyncope or syncope.  Was admitted to the hospital on 08/26/2021 and discharged on 08/28/2021 after an episode of PEA versus syncope at home.  Echocardiogram showed moderate aortic stenosis, LVEF 55-60%, G2 DD mildly dilated left atria and no other significant valvular abnormalities.  Currently wearing cardiac event monitor. Maintain p.o. hydration Await results of cardiac event monitor May return to work without restrictions  Transient atrial fibrillation-denies further episodes of irregular or accelerated heartbeat.  Brief atrial fibrillation  noted in the setting of post PEA versus syncope event.  No indication for anticoagulation. Await results from cardiac event monitor  Moderate aortic stenosis-noted on echocardiogram.  Denies increased shortness of breath or activity intolerance.  States that she is ready to return to work.  Has returned to all of her normal daily activities.  Morbid obesity-weight today 261.6 pounds.  Discussed the importance of physical activity and diet modification. Heart healthy low-sodium diet Increase physical activity as tolerated  Disposition: Follow-up with Dr. [name] or me after completion of cardiac event monitor.         Medication Adjustments/Labs and Tests Ordered: Current medicines are reviewed at length with the patient today.  Concerns regarding medicines are outlined above.  No orders of the defined types were placed in this encounter.  No orders of the defined types were placed in this encounter.   There are no Patient Instructions on file for this visit.   Signed, [name], NP  09/06/2021 6:29 AM      Notice: This dictation was prepared with Dragon dictation along with smaller phrase technology. Any transcriptional errors that result from this process are unintentional and may not be  corrected upon review.  I spent 15 minutes examining this patient, reviewing medications, and using patient centered shared decision making involving her cardiac care.  Prior to her visit I spent greater than 20 minutes reviewing her past medical history,  medications, and prior cardiac tests.

## 2021-09-07 ENCOUNTER — Other Ambulatory Visit: Payer: Self-pay

## 2021-09-07 ENCOUNTER — Encounter (HOSPITAL_BASED_OUTPATIENT_CLINIC_OR_DEPARTMENT_OTHER): Payer: Self-pay | Admitting: General Practice

## 2021-09-07 ENCOUNTER — Ambulatory Visit (HOSPITAL_BASED_OUTPATIENT_CLINIC_OR_DEPARTMENT_OTHER): Payer: PRIVATE HEALTH INSURANCE | Admitting: General Practice

## 2021-09-07 ENCOUNTER — Encounter (HOSPITAL_BASED_OUTPATIENT_CLINIC_OR_DEPARTMENT_OTHER): Payer: Self-pay

## 2021-09-07 VITALS — BP 138/82 | HR 90 | Ht 65.0 in | Wt 261.6 lb

## 2021-09-07 DIAGNOSIS — I469 Cardiac arrest, cause unspecified: Secondary | ICD-10-CM | POA: Diagnosis not present

## 2021-09-07 DIAGNOSIS — I4891 Unspecified atrial fibrillation: Secondary | ICD-10-CM | POA: Diagnosis not present

## 2021-09-07 DIAGNOSIS — R55 Syncope and collapse: Secondary | ICD-10-CM

## 2021-09-07 DIAGNOSIS — I350 Nonrheumatic aortic (valve) stenosis: Secondary | ICD-10-CM | POA: Diagnosis not present

## 2021-09-07 NOTE — Patient Instructions (Signed)
Medication Instructions:  Your Physician recommend you continue on your current medication as directed.    *If you need a refill on your cardiac medications before your next appointment, please call your pharmacy*   Lab Work: Your physician recommends that you return for lab work Today- BMP (3rd floor)   If you have labs (blood work) drawn today and your tests are completely normal, you will receive your results only by: MyChart Message (if you have MyChart) OR A paper copy in the mail If you have any lab test that is abnormal or we need to change your treatment, we will call you to review the results.   Testing/Procedures: None ordered    Follow-Up: At [facility], you and your health needs are our priority.  As part of our continuing mission to provide you with exceptional heart care, we have created designated Provider Care Teams.  These Care Teams include your primary Cardiologist (physician) and Advanced Practice Providers (APPs -  Physician Assistants and Nurse Practitioners) who all work together to provide you with the care you need, when you need it.  We recommend signing up for the patient portal called "MyChart".  Sign up information is provided on this After Visit Summary.  MyChart is used to connect with patients for Virtual Visits (Telemedicine).  Patients are able to view lab/test results, encounter notes, upcoming appointments, etc.  Non-urgent messages can be sent to your provider as well.   To learn more about what you can do with MyChart, go to [email or web address].    Your next appointment:   1-2 month(s)  The format for your next appointment:   In Person  Provider:   Dr. [name]      Other Instructions Tips to Measure your Blood Pressure Correctly  To determine whether you have hypertension, a medical professional will take a blood pressure reading. How you prepare for the test, the position of your arm, and other factors can change a blood pressure  reading by 10% or more. That could be enough to hide high blood pressure, start you on a drug you don't really need, or lead your doctor to incorrectly adjust your medications.  National and international guidelines offer specific instructions for measuring blood pressure. If a doctor, nurse, or medical assistant isn't doing it right, don't hesitate to ask him or her to get with the guidelines.  Here's what you can do to ensure a correct reading:  Don't drink a caffeinated beverage or smoke during the 30 minutes before the test.  Sit quietly for five minutes before the test begins.  During the measurement, sit in a chair with your feet on the floor and your arm supported so your elbow is at about heart level.  The inflatable part of the cuff should completely cover at least 80% of your upper arm, and the cuff should be placed on bare skin, not over a shirt.  Don't talk during the measurement.  Have your blood pressure measured twice, with a brief break in between. If the readings are different by 5 points or more, have it done a third time.  In 2017, new guidelines from the [employer], the [employer] of Cardiology, and nine other health organizations lowered the diagnosis of high blood pressure to 130/80 mm Hg or higher for all adults. The guidelines also redefined the various blood pressure categories to now include normal, elevated, Stage 1 hypertension, Stage 2 hypertension, and hypertensive crisis (see "Blood pressure categories").  Blood pressure categories  Blood pressure category SYSTOLIC (upper number)  DIASTOLIC (lower number)  Normal Less than 120 mm Hg and Less than 80 mm Hg  Elevated 120-129 mm Hg and Less than 80 mm Hg  High blood pressure: Stage 1 hypertension 130-139 mm Hg or 80-89 mm Hg  High blood pressure: Stage 2 hypertension 140 mm Hg or higher or 90 mm Hg or higher  Hypertensive crisis (consult your doctor immediately) Higher than 180 mm Hg and/or  Higher than 120 mm Hg  Source: American Heart Association and American Stroke Association. For more on getting your blood pressure under control, buy Controlling Your Blood Pressure, a Special Health Report from [facility].   Blood Pressure Log   Date   Time  Blood Pressure  Position  Example: Nov 1 9 AM 124/78 sitting

## 2021-09-08 LAB — BASIC METABOLIC PANEL
BUN/Creatinine Ratio: 18 (ref 9–23)
BUN: 10 mg/dL (ref 6–24)
CO2: 20 mmol/L (ref 20–29)
Calcium: 10.3 mg/dL — ABNORMAL HIGH (ref 8.7–10.2)
Chloride: 105 mmol/L (ref 96–106)
Creatinine, Ser: 0.57 mg/dL (ref 0.57–1.00)
Glucose: 101 mg/dL — ABNORMAL HIGH (ref 70–99)
Potassium: 5.4 mmol/L — ABNORMAL HIGH (ref 3.5–5.2)
Sodium: 147 mmol/L — ABNORMAL HIGH (ref 134–144)
eGFR: 105 mL/min/{1.73_m2} (ref >59)

## 2021-09-09 ENCOUNTER — Other Ambulatory Visit: Payer: Self-pay

## 2021-09-09 DIAGNOSIS — I469 Cardiac arrest, cause unspecified: Secondary | ICD-10-CM

## 2021-09-09 DIAGNOSIS — Z79899 Other long term (current) drug therapy: Secondary | ICD-10-CM

## 2021-09-17 LAB — BASIC METABOLIC PANEL
BUN/Creatinine Ratio: 28 — ABNORMAL HIGH (ref 9–23)
BUN: 18 mg/dL (ref 6–24)
CO2: 24 mmol/L (ref 20–29)
Calcium: 10.3 mg/dL — ABNORMAL HIGH (ref 8.7–10.2)
Chloride: 104 mmol/L (ref 96–106)
Creatinine, Ser: 0.64 mg/dL (ref 0.57–1.00)
Glucose: 101 mg/dL — ABNORMAL HIGH (ref 70–99)
Potassium: 4.8 mmol/L (ref 3.5–5.2)
Sodium: 144 mmol/L (ref 134–144)
eGFR: 102 mL/min/{1.73_m2} (ref >59)

## 2021-09-17 LAB — SPECIMEN STATUS REPORT

## 2021-09-19 ENCOUNTER — Telehealth: Payer: Self-pay | Admitting: General Practice

## 2021-09-19 NOTE — Telephone Encounter (Signed)
Patient returning call for lab results.

## 2021-09-19 NOTE — Telephone Encounter (Signed)
Waylan Rocher, LPN  3/66/8159  4:70 AM EST Back to Top    [id number] Forwarded to PCP via EPIC fax function   [name], NP  09/19/2021  7:34 AM EST     Please contact Ms. Bachtel and let her know that her potassium and sodium are now within normal range.  Her calcium is slightly elevated at 10.3.  Please ask her to continue to maintain her increased hydration.  No further testing is needed at this time.  Please send these lab results to her PCP.  Thank you.

## 2021-09-19 NOTE — Telephone Encounter (Signed)
Patient called w/results

## 2021-09-19 NOTE — Telephone Encounter (Signed)
Patient is calling back in regards to this due to not hearing back yet.

## 2021-09-26 ENCOUNTER — Telehealth: Payer: Self-pay | Admitting: Cardiology

## 2021-09-26 NOTE — Telephone Encounter (Signed)
Pt returning phone call for results... please advise

## 2021-09-26 NOTE — Telephone Encounter (Signed)
Results called to patient who verbalizes understanding   "Please contact Nicole Hines and let her know that her cardiac event monitor results of been reviewed.  It showed mainly sinus rhythm with a heart rate of 67 bpm.  She was noted to have runs of SVT.  She was also noted to have rare PACs and PVCs.  Her triggered events were related to normal sinus rhythm and occasional supraventricular ectopies.  No sustained arrhythmias or pauses were noted.  We will continue with her current medication regimen.  Please ask her to avoid triggers for palpitations caffeine, chocolate, EtOH, dehydration etc.  No further testing is needed at this time.  Thank you."

## 2021-10-28 ENCOUNTER — Encounter (HOSPITAL_COMMUNITY): Payer: Self-pay

## 2021-10-28 ENCOUNTER — Emergency Department (HOSPITAL_COMMUNITY)
Admission: EM | Admit: 2021-10-28 | Discharge: 2021-10-28 | Disposition: A | Discharge status: Home / Self Care | Payer: Worker's Compensation | Attending: Emergency Medicine | Admitting: Emergency Medicine

## 2021-10-28 ENCOUNTER — Emergency Department (HOSPITAL_COMMUNITY): Payer: Worker's Compensation

## 2021-10-28 ENCOUNTER — Other Ambulatory Visit: Payer: Self-pay

## 2021-10-28 DIAGNOSIS — S61211A Laceration without foreign body of left index finger without damage to nail, initial encounter: Secondary | ICD-10-CM | POA: Insufficient documentation

## 2021-10-28 DIAGNOSIS — Y990 Civilian activity done for income or pay: Secondary | ICD-10-CM | POA: Insufficient documentation

## 2021-10-28 DIAGNOSIS — W231XXA Caught, crushed, jammed, or pinched between stationary objects, initial encounter: Secondary | ICD-10-CM | POA: Insufficient documentation

## 2021-10-28 DIAGNOSIS — S6992XA Unspecified injury of left wrist, hand and finger(s), initial encounter: Secondary | ICD-10-CM | POA: Diagnosis present

## 2021-10-28 DIAGNOSIS — J45909 Unspecified asthma, uncomplicated: Secondary | ICD-10-CM | POA: Insufficient documentation

## 2021-10-28 MED ORDER — HYDROCODONE-ACETAMINOPHEN 5-325 MG PO TABS
1.0000 | ORAL_TABLET | Freq: Once | ORAL | Status: AC
  Administered 2021-10-28: 1 via ORAL
  Filled 2021-10-28: qty 1

## 2021-10-28 MED ORDER — BACITRACIN ZINC 500 UNIT/GM EX OINT
TOPICAL_OINTMENT | Freq: Two times a day (BID) | CUTANEOUS | Status: DC
  Administered 2021-10-28: 1 via TOPICAL
  Filled 2021-10-28: qty 0.9

## 2021-10-28 MED ORDER — LIDOCAINE HCL (PF) 1 % IJ SOLN
20.0000 mL | Freq: Once | INTRAMUSCULAR | Status: AC
  Administered 2021-10-28: 20 mL
  Filled 2021-10-28: qty 30

## 2021-10-28 NOTE — Discharge Instructions (Addendum)
The laceration was repaired with 9 stitches.  I have attached laceration care information above for you.  If you have any signs of infection such as worsening redness, swelling, drainage from the site please return to the emergency room for evaluation.  The stitches will need to be removed in 7 to 10 days.  He can have these removed at primary care office, urgent care or you can return to this emergency room. ?

## 2021-10-28 NOTE — ED Triage Notes (Addendum)
Patient states she got her left pointer finger caught between a door and a laundry cart. Patient went to the [facility] and was told to come to the ED. No bleeding at this time. ? ? ?Patient added that she received a tetanus shot at the [facility] prior to coming to the ED. ?

## 2021-10-28 NOTE — ED Provider Notes (Signed)
[place] DEPT Provider Note   CSN: [id number] Arrival date & time: 10/28/21  1059     History  Chief Complaint  Patient presents with   Finger Injury    Nicole Hines is a 61 y.o. female.  61 year old female presents today for evaluation of left index finger laceration that occurred at 6 AM this morning while patient was at work.  Patient states she was moving a laundry cart and finger got crushed in between the laundry cart and a metal door handle.  She continues to work.  Bleeding is controlled at this time.  Patient visited in occupational health nurse who recommended she come into the emergency room for evaluation.  She received a tetanus shot during the nurse visit this morning.  The history is provided by the patient. No language interpreter was used.      Home Medications Prior to Admission medications   Medication Sig Start Date End Date Taking? Authorizing Provider  albuterol (VENTOLIN HFA) 108 (90 Base) MCG/ACT inhaler Inhale 2 puffs into the lungs every 4 (four) hours as needed for shortness of breath or wheezing.    [provider]  ALPRAZolam Duanne Moron) 0.5 MG tablet Take 1 mg by mouth at bedtime. For anxiety    [provider]  Calcium Carbonate-Vitamin D (CALCIUM 600+D3 PO) Take 1 tablet by mouth 2 (two) times daily.    [provider]  citalopram (CELEXA) 40 MG tablet Take 40 mg by mouth every morning.    [provider]  clobetasol cream (TEMOVATE) 2.92 % Apply 1 application topically at bedtime. Around lips 10/18/20   [provider]  doxycycline (VIBRA-TABS) 100 MG tablet Take 1 tablet (100 mg total) by mouth 2 (two) times daily. Patient taking differently: Take 100 mg by mouth 2 (two) times daily. Continuously 03/04/21   Tommy Medal, Lavell Islam, MD  fish oil-omega-3 fatty acids 1000 MG capsule Take 1 g by mouth 2 (two) times daily.    [provider]  Fluticasone-Salmeterol (ADVAIR)  250-50 MCG/DOSE AEPB Inhale 2 puffs into the lungs 2 (two) times daily as needed (sob/wheezing).    [provider]  gabapentin (NEURONTIN) 100 MG capsule Take 300 mg by mouth at bedtime. 11/11/20   [provider]  Glucosamine-Chondroit-Vit C-Mn (GLUCOSAMINE 1500 COMPLEX PO) Take 1 capsule by mouth 2 (two) times daily.    [provider]  methocarbamol (ROBAXIN) 750 MG tablet Take 750 mg by mouth 3 (three) times daily as needed for muscle spasms. 06/10/21   [provider]  montelukast (SINGULAIR) 10 MG tablet Take 10 mg by mouth every morning.  10/19/13   [provider]  Multiple Vitamin (MULTIVITAMIN WITH MINERALS) TABS tablet Take 1 tablet by mouth daily.    [provider]  omeprazole (PRILOSEC) 20 MG capsule Take 1 capsule (20 mg total) by mouth daily before breakfast. 12/30/20   Amin, Jeanella Flattery, MD  oxyCODONE-acetaminophen (PERCOCET) 10-325 MG per tablet Take 1 tablet by mouth every 6 (six) hours as needed for pain.  10/25/13   [provider]  polyethylene glycol (MIRALAX / GLYCOLAX) 17 g packet Take 17 g by mouth daily as needed for severe constipation. 12/30/20   Amin, Jeanella Flattery, MD  telmisartan (MICARDIS) 40 MG tablet Take 40 mg by mouth daily. 01/31/21   [provider]  vitamin B-12 (CYANOCOBALAMIN) 1000 MCG tablet Take 1,000 mcg by mouth daily.    [provider]      Allergies  Patient has no known allergies.    Review of Systems   Review of Systems  Constitutional:  Negative for fever.  Skin:  Positive for wound.  All other systems reviewed and are negative.  Physical Exam Updated Vital Signs BP 132/73 (BP Location: Right Arm)    Pulse 60    Temp 97.9 F (36.6 C) (Oral)    Resp 16    Ht 5' 8"  (1.727 m)    Wt 113.4 kg    LMP 02/18/2014    SpO2 93%    BMI 38.01 kg/m  Physical Exam Vitals and nursing note reviewed.  Constitutional:      General: She is not in acute distress.    Appearance:  Normal appearance. She is not ill-appearing.  HENT:     Head: Normocephalic and atraumatic.     Nose: Nose normal.  Eyes:     Conjunctiva/sclera: Conjunctivae normal.  Pulmonary:     Effort: Pulmonary effort is normal. No respiratory distress.  Musculoskeletal:        General: No deformity.  Skin:    Findings: No rash.     Comments: Left index finger with laceration.  See attached picture.  Without active bleeding.  2+ radial pulse present.  Sensation intact.  Neurological:     Mental Status: She is alert.     ED Results / Procedures / Treatments   Labs (all labs ordered are listed, but only abnormal results are displayed) Labs Reviewed - No data to display  EKG None  Radiology DG Finger Index Left  Result Date: 10/28/2021 CLINICAL DATA:  left index finger injury EXAM: LEFT INDEX FINGER 2+V COMPARISON:  Radiograph 08/01/2011 FINDINGS: Soft tissue injury to the distal index finger and soft tissue swelling. No acute osseous abnormality. IMPRESSION: Soft tissue injury and swelling of the distal index finger. No acute osseous abnormality. Electronically Signed   By: Maurine Simmering M.D.   On: 10/28/2021 11:33    Procedures .Marland KitchenLaceration Repair  Date/Time: 10/28/2021 1:20 PM Performed by: Evlyn Courier, PA-C Authorized by: Evlyn Courier, PA-C   Consent:    Consent obtained:  Verbal   Consent given by:  Patient   Risks discussed:  Infection, need for additional repair, pain, poor cosmetic result and poor wound healing   Alternatives discussed:  No treatment and delayed treatment Universal protocol:    Procedure explained and questions answered to patient or proxy's satisfaction: yes     Relevant documents present and verified: yes     Test results available: yes     Imaging studies available: yes     Required blood products, implants, devices, and special equipment available: yes     Site/side marked: yes     Immediately prior to procedure, a time out was called: yes     Patient  identity confirmed:  Verbally with patient and arm band Anesthesia:    Anesthesia method:  Local infiltration   Local anesthetic:  Lidocaine 1% w/o epi Laceration details:    Location:  Finger   Finger location:  L index finger   Length (cm):  6 Pre-procedure details:    Preparation:  Patient was prepped and draped in usual sterile fashion and imaging obtained to evaluate for foreign bodies Exploration:    Hemostasis achieved with:  Direct pressure   Imaging obtained: x-ray     Imaging outcome: foreign body not noted     Wound extent: no nerve damage noted, no tendon damage noted and no underlying fracture noted  Treatment:    Area cleansed with:  Povidone-iodine and saline   Amount of cleaning:  Standard   Irrigation solution:  Sterile saline   Irrigation volume:  500   Undermining:  Minimal Skin repair:    Repair method:  Sutures   Suture size:  5-0   Suture material:  Prolene   Number of sutures:  9 Approximation:    Approximation:  Close Repair type:    Repair type:  Simple Post-procedure details:    Dressing:  Non-adherent dressing   Procedure completion:  Tolerated well, no immediate complications    Medications Ordered in ED Medications  lidocaine (PF) (XYLOCAINE) 1 % injection 20 mL (has no administration in time range)    ED Course/ Medical Decision Making/ A&P                           Medical Decision Making Amount and/or Complexity of Data Reviewed Radiology: ordered.  Risk Prescription drug management.   Medical Decision Making / ED Course   This patient presents to the ED for concern of left index finger laceration, this involves an extensive number of treatment options, and is a complaint that carries with it a high risk of complications and morbidity.  The differential diagnosis includes fracture, laceration  MDM: 61 year old female presents today for evaluation of left index finger laceration that occurred around 6 AM this morning.  She is  without any signs or symptoms of infection.  It was crushed between large record and metal door handle.  Bleeding is controlled.  She received her tetanus shot this morning at her occupational health clinic.  Neurovascularly intact.  Full range of motion present.  Repaired with 9 stitches.  Post laceration repair care discussed.  Patient takes doxycycline 100 mg twice daily chronically for her previously diagnosed MRSA.  No need for additional antibiotic coverage.  Return precautions discussed.  Patient is appropriate for discharge.  Discharged in stable condition.  Lab Tests: -I ordered, reviewed, and interpreted labs.   The pertinent results include:   Labs Reviewed - No data to display    EKG  EKG Interpretation  Date/Time:    Ventricular Rate:    PR Interval:    QRS Duration:   QT Interval:    QTC Calculation:   R Axis:     Text Interpretation:           Imaging Studies ordered: I ordered imaging studies including left hand x-ray I independently visualized and interpreted imaging. I agree with the radiologist interpretation   Medicines ordered and prescription drug management: Meds ordered this encounter  Medications   lidocaine (PF) (XYLOCAINE) 1 % injection 20 mL   HYDROcodone-acetaminophen (NORCO/VICODIN) 5-325 MG per tablet 1 tablet    -I have reviewed the patients home medicines and have made adjustments as needed  Reevaluation: After the interventions noted above, I reevaluated the patient and found that they have :stayed the same  Co morbidities that complicate the patient evaluation  Past Medical History:  Diagnosis Date   Amenorrhea    irreg cycle   Anxiety    Arthritis    Asthma    MRSA infection    Myofascitis 03/04/2021   Neck pain on right side 05/16/2021   Obesity    Phlegmon 01/07/2021   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  Patient is appropriate for discharge.   Final Clinical  Impression(s) /  ED Diagnoses Final diagnoses:  Laceration of left index finger without foreign body without damage to nail, initial encounter    Rx / DC Orders ED Discharge Orders     None         Evlyn Courier, PA-C 10/28/21 Savage, Dan, DO 10/28/21 1440

## 2021-11-14 ENCOUNTER — Other Ambulatory Visit: Payer: Self-pay

## 2021-11-14 ENCOUNTER — Encounter: Payer: Self-pay | Admitting: Infectious Disease

## 2021-11-14 ENCOUNTER — Ambulatory Visit: Payer: PRIVATE HEALTH INSURANCE | Admitting: Infectious Disease

## 2021-11-14 VITALS — BP 132/68 | HR 69 | Temp 98.1°F | Ht 67.0 in | Wt 256.0 lb

## 2021-11-14 DIAGNOSIS — M8668 Other chronic osteomyelitis, other site: Secondary | ICD-10-CM

## 2021-11-14 DIAGNOSIS — M4628 Osteomyelitis of vertebra, sacral and sacrococcygeal region: Secondary | ICD-10-CM | POA: Diagnosis not present

## 2021-11-14 DIAGNOSIS — L0291 Cutaneous abscess, unspecified: Secondary | ICD-10-CM | POA: Diagnosis not present

## 2021-11-14 DIAGNOSIS — I469 Cardiac arrest, cause unspecified: Secondary | ICD-10-CM

## 2021-11-14 DIAGNOSIS — Z22322 Carrier or suspected carrier of Methicillin resistant Staphylococcus aureus: Secondary | ICD-10-CM | POA: Diagnosis not present

## 2021-11-14 DIAGNOSIS — M00052 Staphylococcal arthritis, left hip: Secondary | ICD-10-CM

## 2021-11-14 HISTORY — DX: Other chronic osteomyelitis, other site: M86.68

## 2021-11-14 NOTE — Progress Notes (Signed)
? ?Subjective:  ?Chief complaint: She recently sustained a laceration to her finger which is healing ? Patient ID: Nicole Hines, female    DOB: Nov 12, 1960, 61 y.o.   MRN: [id number] ? ?HPI ? ? 61 y.o. female with MRSA bacteremia and septic SI joint also with murmur,.  She had a 2D echocardiogram and a transesophageal echocardiogram which were both negative for endocarditis. ? ?After her initial cultures on 30 April were positive for MRSA her repeat blood cultures on the third of May were not growing any organism and never did. ? ?However would not reach day 5 of cultures being finalized.  On the day in question she had lost IV access and her pain was not capable of being controlled apparently without IV Dilaudid. ? ?Therefore I recommended getting another set of blood cultures prior to placement of the PICC line that day.  Unfortunately that culture that was done turned out to be only a single culture and did itself again grow MRSA. ? ?My partner Dr. Baxter Flattery and Dr. Linus Salmons was automatically consulted and saw the patient again contemplated PICC line removal.  Repeat blood cultures were done and were negative and PICC line was not removed. ? ?My own approach would have been to remove the PICC line if necessary bridger with oral therapy prior to placing a new PICC line provided her pain could have been controlled. ? ?In the interim she was found to have on CT scan new hypoattenuating area in the left iliac muscle concerning for a punctate foci of gas with 6 x 3.1 x 1.6 cm with concern for phlegmon near the left sacroiliac joint.  Interventional radiology attempted to aspirate this area but were unsuccessful. ? ?She is then continued on daptomycin and was discharged to home with home IV antibiotics. ? ?Hip pain  had continued to improve when I saw her last but still was having some pain in her groin that she attributed to the attempt to aspirate the phlegmon ? ? ?I ordered an MRI of her hip to reassess the area of prior  phlegmonous changes. ? 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 ? ?Patient continued on doxycycline in the interim. ? ?I last saw her inflammatory markers did come down. ? ?At her last visit she was telling me about pain in her neck that she says she has had since she was hospitalized particular if she tries to turn her head to the right to look behind her. ? ?She does have more limited range of motion turning her head to the right she does not have tenderness over sternoclavicular joint of the muscles themselves the shoulder joint range of motion is ? ? ?I obtained an MRI of the C-spine with and without contrast. ? ?This showed no evidence of discitis or epidural abscess in the cervical spine there was some multilevel facet arthropathy and edema and enhancement of the C3-C4 facet joint thought to be degenerative with septic arthritis being a much less likely possibility there is also some mild neural foraminal stenosis at C3 4 and C4-5, but no spinal stenosis. ? ?She is continued on doxycycline.  She continued to have low back pain and some hip pain but more so when she is  working although she also feels like it is better when she is moving rather than sitting still. ? ?She had a complicated stay recently in the hospital with a PEA arrest and prolonged resuscitation. ? ? ? ? ? ? ? ? ? ?  ? ?Past Medical History:  ?Diagnosis Date  ? Amenorrhea   ? irreg cycle  ? Anxiety   ? Arthritis   ? Asthma   ? MRSA infection   ? Myofascitis 03/04/2021  ? Neck pain on right side 05/16/2021  ? Obesity   ? Phlegmon 01/07/2021  ? 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 ? ?Past Surgical History:  ?Procedure Laterality Date  ?  COLONOSCOPY WITH PROPOFOL N/A 01/08/2014  ? Procedure: COLONOSCOPY WITH PROPOFOL;  Surgeon: [name], MD;  Location: [facility];  Service: Endoscopy;  Laterality: N/A;  ? [place] OF UTERUS  1990  ? LAPAROSCOPIC CHOLECYSTECTOMY  1994  ? mrsa    ? had to have area cut out. abdomen  ? TEE WITHOUT CARDIOVERSION N/A 12/27/2020  ? Procedure: TRANSESOPHAGEAL ECHOCARDIOGRAM (TEE);  Surgeon: [name], MD;  Location: [facility];  Service: Cardiovascular;  Laterality: N/A;  ? ? ?Family History  ?Problem Relation Age of Onset  ? Cancer Mother   ?     pancreatic  ? Diabetes Mother   ? Hypertension Mother   ? Heart attack Father   ? Heart attack Maternal Grandmother   ? Diabetes Brother   ? Cancer Maternal Aunt   ?     uterine  ? Diabetes Maternal Aunt   ? Colon cancer Neg Hx   ? ? ?  ?Social History  ? ?Socioeconomic History  ? Marital status: Divorced  ?  Spouse name: Not on file  ? Number of children: Not on file  ? Years of education: Not on file  ? Highest education level: Not on file  ?Occupational History  ? Not on file  ?Tobacco Use  ? Smoking status: Never  ? Smokeless tobacco: Never  ?Vaping Use  ? Vaping Use: Never used  ?Substance and Sexual Activity  ? Alcohol use: No  ? Drug use: No  ? Sexual activity: Yes  ?  Partners: Female  ?  Birth control/protection: Surgical  ?  Comment: btl  ?Other Topics Concern  ? Not on file  ?Social History Narrative  ? Not on file  ? ?Social Determinants of Health  ? ?Financial Resource Strain: Not on file  ?Food Insecurity: Not on file  ?Transportation Needs: Not on file  ?Physical Activity: Not on file  ?Stress: Not on file  ?Social Connections: Not on file  ? ? ?No Known Allergies ? ? ?Current Outpatient Medications:  ?  albuterol (VENTOLIN HFA) 108 (90 Base) MCG/ACT inhaler, Inhale 2 puffs into the lungs every 4 (four) hours as needed for shortness of breath or wheezing., Disp: , Rfl:  ?  ALPRAZolam (XANAX) 0.5 MG tablet, Take 1 mg by mouth at  bedtime. For anxiety, Disp: , Rfl:  ?  Calcium Carbonate-Vitamin D (CALCIUM 600+D3 PO), Take 1 tablet by mouth 2 (two) times daily., Disp: , Rfl:  ?  citalopram (CELEXA) 40 MG tablet, Take 40 mg by mouth every morning., Disp: , Rfl:  ?  clobetasol cream (TEMOVATE) 2.99 %, Apply 1 application topically at bedtime. Around lips, Disp: , Rfl:  ?  doxycycline (VIBRA-TABS) 100 MG tablet, Take 1 tablet (100 mg total) by mouth 2 (two) times daily. (Patient taking differently: Take 100  mg by mouth 2 (two) times daily. Continuously), Disp: 60 tablet, Rfl: 11 ?  fish oil-omega-3 fatty acids 1000 MG capsule, Take 1 g by mouth 2 (two) times daily., Disp: , Rfl:  ?  gabapentin (NEURONTIN) 100 MG capsule, Take 300 mg by mouth at bedtime., Disp: , Rfl:  ?  Glucosamine-Chondroit-Vit C-Mn (GLUCOSAMINE 1500 COMPLEX PO), Take 1 capsule by mouth 2 (two) times daily., Disp: , Rfl:  ?  methocarbamol (ROBAXIN) 750 MG tablet, Take 750 mg by mouth 3 (three) times daily as needed for muscle spasms., Disp: , Rfl:  ?  montelukast (SINGULAIR) 10 MG tablet, Take 10 mg by mouth every morning. , Disp: , Rfl:  ?  Multiple Vitamin (MULTIVITAMIN WITH MINERALS) TABS tablet, Take 1 tablet by mouth daily., Disp: , Rfl:  ?  omeprazole (PRILOSEC) 20 MG capsule, Take 1 capsule (20 mg total) by mouth daily before breakfast., Disp: 30 capsule, Rfl: 0 ?  oxyCODONE-acetaminophen (PERCOCET) 10-325 MG per tablet, Take 1 tablet by mouth every 6 (six) hours as needed for pain. , Disp: , Rfl:  ?  polyethylene glycol (MIRALAX / GLYCOLAX) 17 g packet, Take 17 g by mouth daily as needed for severe constipation., Disp: 14 each, Rfl: 2 ?  telmisartan (MICARDIS) 40 MG tablet, Take 40 mg by mouth daily., Disp: , Rfl:  ?  vitamin B-12 (CYANOCOBALAMIN) 1000 MCG tablet, Take 1,000 mcg by mouth daily., Disp: , Rfl:  ?  Fluticasone-Salmeterol (ADVAIR) 250-50 MCG/DOSE AEPB, Inhale 2 puffs into the lungs 2 (two) times daily as needed (sob/wheezing). (Patient not taking: Reported  on 11/14/2021), Disp: , Rfl:  ? ? ? ?Review of Systems  ?Constitutional:  Negative for activity change, appetite change, chills, diaphoresis, fatigue, fever and unexpected weight change.  ?HENT:  Negative

## 2021-11-15 ENCOUNTER — Telehealth: Payer: Self-pay | Admitting: *Deleted

## 2021-11-15 LAB — BASIC METABOLIC PANEL WITH GFR
BUN: 12 mg/dL (ref 7–25)
CO2: 31 mmol/L (ref 20–32)
Calcium: 10 mg/dL (ref 8.6–10.4)
Chloride: 101 mmol/L (ref 98–110)
Creat: 0.56 mg/dL (ref 0.50–1.03)
Glucose, Bld: 96 mg/dL (ref 65–99)
Potassium: 3.8 mmol/L (ref 3.5–5.3)
Sodium: 139 mmol/L (ref 135–146)
eGFR: 105 mL/min/{1.73_m2} (ref >=60)

## 2021-11-15 LAB — CBC WITH DIFFERENTIAL/PLATELET
Absolute Monocytes: 532 cells/uL (ref 200–950)
Basophils Absolute: 28 cells/uL (ref 0–200)
Basophils Relative: 0.4 %
Eosinophils Absolute: 168 cells/uL (ref 15–500)
Eosinophils Relative: 2.4 %
HCT: 37.2 % (ref 35.0–45.0)
Hemoglobin: 12 g/dL (ref 11.7–15.5)
Lymphs Abs: 2436 cells/uL (ref 850–3900)
MCH: 29.5 pg (ref 27.0–33.0)
MCHC: 32.3 g/dL (ref 32.0–36.0)
MCV: 91.4 fL (ref 80.0–100.0)
MPV: 10.3 fL (ref 7.5–12.5)
Monocytes Relative: 7.6 %
Neutro Abs: 3836 cells/uL (ref 1500–7800)
Neutrophils Relative %: 54.8 %
Platelets: 271 10*3/uL (ref 140–400)
RBC: 4.07 10*6/uL (ref 3.80–5.10)
RDW: 13.1 % (ref 11.0–15.0)
Total Lymphocyte: 34.8 %
WBC: 7 10*3/uL (ref 3.8–10.8)

## 2021-11-15 LAB — C-REACTIVE PROTEIN: CRP: 1 mg/L (ref <8.0)

## 2021-11-15 LAB — SEDIMENTATION RATE: Sed Rate: 25 mm/h (ref 0–30)

## 2021-11-15 NOTE — Telephone Encounter (Signed)
-----   Message from [name], MD sent at 11/14/2021  8:38 PM EDT ----- ?I think that could be very true. I don't see her on our appt list but I will be sure she is scheduled for follow-up with [place]. We can arrange for a sleep study. ? ?----- Message ----- ?From: Tommy Medal, Lavell Islam, MD ?Sent: 11/14/2021   3:18 PM EDT ?To: [name], MD ? ?I am a bit worried Brylynn likely has OSA and ? Have played role in her PEA arrest in context of sedatives?  ? ?

## 2021-11-15 NOTE — Telephone Encounter (Signed)
Scheduling will reach out to the pt to obtain a follow-up appt.  Staff message sent to scheduling team lead to arrange.  ? ? ?neeeds appt ?Received: Today ?Newnam, Lonna Cobb, LPN ?I left her a VM to call us back  ?

## 2021-11-18 ENCOUNTER — Telehealth: Payer: Self-pay | Admitting: *Deleted

## 2021-11-18 NOTE — Telephone Encounter (Signed)
Message below from [facility] in scheduling: ? ?just spoke to the patients daughter and she is going to text her mom and have her call us. [name] has an opening on Monday at 8:20am, do you want me to put a hold on that slot for the pt? ? ? ?Leah held appt slot for next Monday 4/3 at 0820 for pt to see Dr. [name].  She spoke with the pts daughter and advised to have her call back so we can schedule and confirm this appt. ? ?Pts Infection Disease MD and Dr. [name] aware that we have been trying to call the pt to get her in, with no success.   ?

## 2021-11-18 NOTE — Telephone Encounter (Signed)
Pt is now scheduled to see Dr. [name] on 11/21/21 at [facility].  Scheduling was able to make contact with her to make this appt.  ?

## 2021-11-18 NOTE — Telephone Encounter (Signed)
neeeds appt ?Received: Today ?Newnam, Wyatt Mage, Lavell Islam, MD; Nuala Alpha, LPN ?Cc: [name], MD ?I'm not quite sure, I reached out to her daughter today and she said she was going to text her mom and have her call the office to schedule an appointment. It looks like she still has not been scheduled yet so I'm assuming she has not called into the office yet.  ?

## 2021-11-18 NOTE — Telephone Encounter (Signed)
-----   Message from [facility] sent at 11/17/2021  9:12 AM EDT ----- ?Regarding: RE: neeeds appt ?Good morning, ? ?Just FYI, I have called this patient two times and left a voicemail both times for her to call back to schedule an appointment.  ?----- Message ----- ?From: Nuala Alpha, LPN ?Sent: 11/15/2021  12:20 PM EDT ?To: Consuella Lose, # ?Subject: neeeds appt                                   ? ?Indicated below on behalf of 2 Providers. ?Pt needs an appt.  Can you please schedule and let me know. ? ?Thanks, ?Pierce Barocio  ? ? ?[name], MD  Tommy Medal, Lavell Islam, MD;  ?I think that could be very true. I don't see her on our appt list but I will be sure she is scheduled for follow-up with [place]. We can arrange for a sleep study.   ? ?? ?Previous Messages ?? ?----- Message -----  ?From: Tommy Medal, Lavell Islam, MD  ?Sent: 11/14/2021 ? 3:18 PM EDT  ?To: [name], MD  ? ?I am a bit worried Erryn likely has OSA and ? Have played role in her PEA arrest in context of sedatives?  ? ? ? ?

## 2021-11-18 NOTE — Telephone Encounter (Signed)
Me ?  ?   7:27 AM ?Note ?----- Message from Imagene Gurney sent at 11/17/2021  9:12 AM EDT ----- ?Regarding: RE: neeeds appt ?Good morning, ?  ?Just FYI, I have called this patient two times and left a voicemail both times for her to call back to schedule an appointment.  ?----- Message ----- ?From: Nuala Alpha, LPN ?Sent: 11/15/2021  12:20 PM EDT ?To: Consuella Lose, # ?Subject: neeeds appt                                   ?  ?Indicated below on behalf of 2 Providers. ?Pt needs an appt.  Can you please schedule and let me know. ?  ?Thanks, ?Iva Montelongo  ?  ?  ?[name], MD  Tommy Medal, Lavell Islam, MD;  ?I think that could be very true. I don't see her on our appt list but I will be sure she is scheduled for follow-up with [place]. We can arrange for a sleep study.   ?  ?  ?Previous Messages ?  ?----- Message -----  ?From: Tommy Medal, Lavell Islam, MD  ?Sent: 11/14/2021   3:18 PM EDT  ?To: [name], MD  ?  ?I am a bit worried Alazia likely has OSA and ? Have played role in her PEA arrest in context of sedatives?   ?  ? ?

## 2021-11-21 ENCOUNTER — Encounter: Payer: Self-pay | Admitting: Cardiology

## 2021-11-21 ENCOUNTER — Ambulatory Visit: Payer: PRIVATE HEALTH INSURANCE | Admitting: Cardiology

## 2021-11-21 ENCOUNTER — Telehealth: Payer: Self-pay | Admitting: *Deleted

## 2021-11-21 VITALS — BP 110/70 | HR 63 | Ht 67.0 in | Wt 256.0 lb

## 2021-11-21 DIAGNOSIS — I350 Nonrheumatic aortic (valve) stenosis: Secondary | ICD-10-CM

## 2021-11-21 DIAGNOSIS — I10 Essential (primary) hypertension: Secondary | ICD-10-CM

## 2021-11-21 DIAGNOSIS — I480 Paroxysmal atrial fibrillation: Secondary | ICD-10-CM | POA: Diagnosis not present

## 2021-11-21 DIAGNOSIS — I469 Cardiac arrest, cause unspecified: Secondary | ICD-10-CM | POA: Diagnosis not present

## 2021-11-21 DIAGNOSIS — R7881 Bacteremia: Secondary | ICD-10-CM

## 2021-11-21 DIAGNOSIS — R55 Syncope and collapse: Secondary | ICD-10-CM

## 2021-11-21 DIAGNOSIS — G4733 Obstructive sleep apnea (adult) (pediatric): Secondary | ICD-10-CM

## 2021-11-21 DIAGNOSIS — B9562 Methicillin resistant Staphylococcus aureus infection as the cause of diseases classified elsewhere: Secondary | ICD-10-CM

## 2021-11-21 NOTE — Progress Notes (Signed)
?Cardiology Office Note:   ? ?Date:  11/21/2021  ? ?ID:  Nicole Hines, DOB October 28, 1960, MRN [id number] ? ?PCP:  [name], MD ?  ?[facility] Providers ?Cardiologist:  [name], MD { ? ? ?Referring MD: [name], MD  ? ? ?History of Present Illness:   ? ?Nicole Hines is a 62 y.o. female with a hx of  hx of moderate AS, HTN, morbid obesity, MRSA bacteremia c/b septic L sacroilitis, PEA arrest in 08/2020 and chronic pain who presents to clinic for follow-up. ? ?Patient hospitalized in 12/2020 for left septic scaroiliac joint with MRSA bacteremia. TEE at that time without evidence of vegetation. Underwent drainage of the joint and received IV daptomycin. Has been followed by ID and been maintained on doxycycline.  ? ?Was re-hospitalized at [facility] in 08/2021 after being found down unresponsive by her son. CPR was started and EMS was called. Upon arrival of EMS, the patient was noted to be in PEA arrest. Epi x4 was administered before ROSC was achieved. Post-ROSC rhythm was Afib with RVR then sinus tachycardia. She was still noted to be lethargic post-ROSC so narcan was given. Reportedly when she received narcan her lethargy/confusion completely resolved. In the ER, labs notable for potassium 2.9, WBC 20.1, lactate 4, and troponin 112 -> 359. CTH with no acute intracranial process. CT angio chest negative for dissection or PE. EKG without ST segment changes. Was seen by Cardiology and deemed that arrest unlikely to be due to ACS and likely multifactorial in nature from dehydration, narcotic use, hypoxia, possibly hypotension in the setting of moderate AS. She was discharged home in stable condition. Follow-up cardiac monitor with nonsustained SVT but no evidence of recurrent Afib or sustained arrhythmias.  ? ?Was last seen in clinic by Coletta Memos, NP where she was doing well with no recurrent syncope.  ? ?Today, the patient overall feels okay. She is currently working 6 days per week at a [employer] and she remains very active during the day. No chest pain, SOB, lightheadedness, dizziness, or syncope. Reports that she snores and has some daytime somnolence. Continues to have intermittent left hip pain and is planned for repeat MR imaging with Dr. Tommy Medal.  ? ?Past Medical History:  ?Diagnosis Date  ? Amenorrhea   ? irreg cycle  ? Anxiety   ? Arthritis   ? Asthma   ? Chronic osteomyelitis of sacrum ([place]) 11/14/2021  ? MRSA infection   ? Myofascitis 03/04/2021  ? Neck pain on right side 05/16/2021  ? Obesity   ? Phlegmon 01/07/2021  ? Sacral osteomyelitis ([place]) 03/04/2021  ? ? ?Past Surgical History:  ?Procedure Laterality Date  ? COLONOSCOPY WITH PROPOFOL N/A 01/08/2014  ? Procedure: COLONOSCOPY WITH PROPOFOL;  Surgeon: [name], MD;  Location: [facility] ENDOSCOPY;  Service: Endoscopy;  Laterality: N/A;  ? Deport OF UTERUS  1990  ? LAPAROSCOPIC CHOLECYSTECTOMY  1994  ? mrsa    ? had to have area cut out. abdomen  ? TEE WITHOUT CARDIOVERSION N/A 12/27/2020  ? Procedure: TRANSESOPHAGEAL ECHOCARDIOGRAM (TEE);  Surgeon: [name], MD;  Location: [place];  Service: Cardiovascular;  Laterality: N/A;  ? ? ?Current Medications: ?Current Meds  ?Medication Sig  ? albuterol (VENTOLIN HFA) 108 (90 Base) MCG/ACT inhaler Inhale 2 puffs into the lungs every 4 (four) hours as needed for shortness of breath or wheezing.  ? ALPRAZolam (XANAX) 0.5 MG tablet Take 1 mg by mouth at bedtime.  For anxiety  ? Calcium Carbonate-Vitamin D (CALCIUM 600+D3 PO) Take 1 tablet by mouth 2 (two) times daily.  ? citalopram (CELEXA) 40 MG tablet Take 40 mg by mouth every morning.  ? clobetasol cream (TEMOVATE) 1.76 % Apply 1 application topically at bedtime. Around lips  ? doxycycline (VIBRA-TABS) 100 MG tablet Take 1 tablet (100 mg total) by mouth 2 (two) times daily.  ? fish oil-omega-3 fatty acids 1000 MG capsule Take 1 g by mouth 2 (two) times daily.  ? gabapentin (NEURONTIN) 100 MG capsule Take 300 mg  by mouth at bedtime.  ? Glucosamine-Chondroit-Vit C-Mn (GLUCOSAMINE 1500 COMPLEX PO) Take 1 capsule by mouth 2 (two) times daily.  ? methocarbamol (ROBAXIN) 750 MG tablet Take 750 mg by mouth 3 (three) times daily as needed for muscle spasms.  ? montelukast (SINGULAIR) 10 MG tablet Take 10 mg by mouth every morning.   ? Multiple Vitamin (MULTIVITAMIN WITH MINERALS) TABS tablet Take 1 tablet by mouth daily.  ? omeprazole (PRILOSEC) 20 MG capsule Take 1 capsule (20 mg total) by mouth daily before breakfast.  ? oxyCODONE-acetaminophen (PERCOCET) 10-325 MG per tablet Take 1 tablet by mouth every 6 (six) hours as needed for pain.   ? polyethylene glycol (MIRALAX / GLYCOLAX) 17 g packet Take 17 g by mouth daily as needed for severe constipation.  ? telmisartan (MICARDIS) 40 MG tablet Take 40 mg by mouth daily.  ? vitamin B-12 (CYANOCOBALAMIN) 1000 MCG tablet Take 1,000 mcg by mouth daily.  ?  ? ?Allergies:   Patient has no known allergies.  ? ?Social History  ? ?Socioeconomic History  ? Marital status: Divorced  ?  Spouse name: Not on file  ? Number of children: Not on file  ? Years of education: Not on file  ? Highest education level: Not on file  ?Occupational History  ? Not on file  ?Tobacco Use  ? Smoking status: Never  ? Smokeless tobacco: Never  ?Vaping Use  ? Vaping Use: Never used  ?Substance and Sexual Activity  ? Alcohol use: No  ? Drug use: No  ? Sexual activity: Yes  ?  Partners: Female  ?  Birth control/protection: Surgical  ?  Comment: btl  ?Other Topics Concern  ? Not on file  ?Social History Narrative  ? Not on file  ? ?Social Determinants of Health  ? ?Financial Resource Strain: Not on file  ?Food Insecurity: Not on file  ?Transportation Needs: Not on file  ?Physical Activity: Not on file  ?Stress: Not on file  ?Social Connections: Not on file  ?  ? ?Family History: ?The patient's family history includes Cancer in her maternal aunt and mother; Diabetes in her brother, maternal aunt, and mother; Heart  attack in her father and maternal grandmother; Hypertension in her mother. There is no history of Colon cancer. ? ?ROS:   ?Please see the history of present illness.    ?Review of Systems  ?Constitutional:  Negative for chills and fever.  ?Respiratory:  Negative for shortness of breath.   ?Cardiovascular:  Negative for chest pain, palpitations, orthopnea, claudication, leg swelling and PND.  ?Gastrointestinal:  Negative for nausea and vomiting.  ?Genitourinary:  Negative for hematuria.  ?Musculoskeletal:  Positive for falls.  ?Neurological:  Negative for dizziness and loss of consciousness.   ? ?EKGs/Labs/Other Studies Reviewed:   ? ?The following studies were reviewed today: ?Cardiac Monitor 09/20/21: ?Patch wear time was 14 days. ?Predominant rhythm was NSR with average HR 67bpm (42-169bpm) ?There were 50  runs of nonsustained SVT with longest lasting 11 beats ?Rare PACs, rare PVCs ?Patient triggered events correlated with NSR and occasionally with SVEs. ?No sustained arrhythmias or pauses. ?  ?  ?Patch Wear Time:  14 days and 0 hours (2023-01-12T19:29:50-0500 to 2023-01-26T19:29:50-0500) ?  ?Patient had a min HR of 42 bpm, max HR of 169 bpm, and avg HR of 67 bpm. Predominant underlying rhythm was Sinus Rhythm. 50 Supraventricular Tachycardia runs occurred, the run with the fastest interval lasting 7 beats with a max rate of 169 bpm, the  ?longest lasting 11 beats with an avg rate of 120 bpm. Isolated SVEs were rare (<1.0%), SVE Couplets were rare (<1.0%), and SVE Triplets were rare (<1.0%). Isolated VEs were rare (<1.0%), and no VE Couplets or VE Triplets were present.  ? ?TEE 12/27/20: ?IMPRESSIONS  ? 1. The aortic valve is tricuspid with moderate leaflet thickening and  ?calcification. There is also nodular commisural calcification which is  ?highly echogenic. This is most consistent with calcification with low  ?suspicion for valvular vegetations. There  ?is moderate aortic stenosis with AVA 1.1cm2, mean gradient  7mHg, peak  ?gradient 643mg, Vmax 3.75m92m DI 0.3. Trivial aortic regurgitation.  ? 2. Left ventricular ejection fraction, by estimation, is 60 to 65%. The  ?left ventricle has normal function.  ? 3. Right

## 2021-11-21 NOTE — Telephone Encounter (Signed)
-----   Message from Nuala Alpha, LPN sent at 03/27/2157 10:06 AM EDT ----- ?Regarding: split night sleep study per [name] ?Dr. [name] wants this pt to have a split night sleep study only, not the Itamar.  ?This is for PEA and OSA.  Test was advised by pts Infectious disease Doctor, Dr. Tommy Medal and Dr. [name].  ?Study was ordered.  ? ?Please pre-cert and schedule the test.  Can you please let me know when it's scheduled so I can endorse this to Dr. [name]?  Pt is aware you will be in contact with her to arrange.  ? ? ?Thanks, ?Ivy  ? ? ? ?

## 2021-11-21 NOTE — Patient Instructions (Signed)
Medication Instructions:  ? ?Your physician recommends that you continue on your current medications as directed. Please refer to the Current Medication list given to you today. ? ?*If you need a refill on your cardiac medications before your next appointment, please call your pharmacy* ? ? ?Testing/Procedures: ? ?Your physician has requested that you have an echocardiogram. Echocardiography is a painless test that uses sound waves to create images of your heart. It provides your doctor with information about the size and shape of your heart and how well your heart?s chambers and valves are working. This procedure takes approximately one hour. There are no restrictions for this procedure.  PLEASE SCHEDULE TO BE DONE IN AUGUST 2023 PER DR. [name] ? ? ?Your physician has recommended that you have a SPLIT NIGHT sleep study. This test records several body functions during sleep, including: brain activity, eye movement, oxygen and carbon dioxide blood levels, heart rate and rhythm, breathing rate and rhythm, the flow of air through your mouth and nose, snoring, body muscle movements, and chest and belly movement.  OUR SLEEP STUDY COORDINATOR NINA JONES WILL BE IN CONTACT WITH YOU SOON TO ARRANGE THIS TEST. ? ? ?Follow-Up: ?At [facility], you and your health needs are our priority.  As part of our continuing mission to provide you with exceptional heart care, we have created designated Provider Care Teams.  These Care Teams include your primary Cardiologist (physician) and Advanced Practice Providers (APPs -  Physician Assistants and Nurse Practitioners) who all work together to provide you with the care you need, when you need it. ? ?We recommend signing up for the patient portal called "MyChart".  Sign up information is provided on this After Visit Summary.  MyChart is used to connect with patients for Virtual Visits (Telemedicine).  Patients are able to view lab/test results, encounter notes, upcoming  appointments, etc.  Non-urgent messages can be sent to your provider as well.   ?To learn more about what you can do with MyChart, go to [email or web address].   ? ?Your next appointment:   ?6 month(s) ? ?The format for your next appointment:   ?In Person ? ?Provider:   ?[name], MD ?OR AN EXTENDER ? ?

## 2021-11-29 NOTE — Telephone Encounter (Signed)
Prior Authorization for SPLIT NIGHT sent to [facility] via Phone. TURNER READ -APPROVED  ?PER TRIANNA-4/11-  NO PA REQUIRED ?

## 2021-12-05 NOTE — Telephone Encounter (Signed)
-----   Message from Nuala Alpha, LPN sent at 8/0/8811 10:06 AM EDT ----- ?Regarding: split night sleep study per [name] ?Dr. [name] wants this pt to have a split night sleep study only, not the Itamar.  ?This is for PEA and OSA.  Test was advised by pts Infectious disease Doctor, Dr. Tommy Medal and Dr. [name].  ?Study was ordered.  ? ?Please pre-cert and schedule the test.  Can you please let me know when it's scheduled so I can endorse this to Dr. [name]?  Pt is aware you will be in contact with her to arrange.  ? ? ?Thanks, ?Jermisha Hoffart  ? ? ? ?

## 2021-12-05 NOTE — Telephone Encounter (Signed)
Pts split night sleep study is scheduled for 01/03/22.  Pt made aware of appt date and time by Sleep Study Coordinator.  ?

## 2022-01-03 ENCOUNTER — Ambulatory Visit (HOSPITAL_BASED_OUTPATIENT_CLINIC_OR_DEPARTMENT_OTHER): Payer: PRIVATE HEALTH INSURANCE | Attending: Cardiology | Admitting: Cardiology

## 2022-01-03 DIAGNOSIS — I469 Cardiac arrest, cause unspecified: Secondary | ICD-10-CM | POA: Diagnosis not present

## 2022-01-03 DIAGNOSIS — R0683 Snoring: Secondary | ICD-10-CM | POA: Diagnosis present

## 2022-01-03 DIAGNOSIS — G4733 Obstructive sleep apnea (adult) (pediatric): Secondary | ICD-10-CM | POA: Insufficient documentation

## 2022-01-13 NOTE — Procedures (Signed)
   Patient Name: Nicole Hines, Nicole Hines Date: 01/03/2022 Gender: Female D.O.B: 1961/05/06 Age (years): 84 Referring Provider: Gwyndolyn Kaufman MD Height (inches): 68 Interpreting Physician: Fransico Him MD, ABSM Weight (lbs): 261 RPSGT: Laren Everts BMI: 40 MRN: [id number] Neck Size: 14.00  CLINICAL INFORMATION Sleep Study Type: NPSG Indication for sleep study: Fatigue, Obesity Epworth Sleepiness Score: 3  SLEEP STUDY TECHNIQUE As per the AASM Manual for the Scoring of Sleep and Associated Events v2.3 (April 2016) with a hypopnea requiring 4% desaturations. The channels recorded and monitored were frontal, central and occipital EEG, electrooculogram (EOG), submentalis EMG (chin), nasal and oral airflow, thoracic and abdominal wall motion, anterior tibialis EMG, snore microphone, electrocardiogram, and pulse oximetry.  MEDICATIONS Medications self-administered by patient taken the night of the study : [facility], OXYCODONE HCL  SLEEP ARCHITECTURE The study was initiated at 10:43:03 PM and ended at 5:25:50 AM. Sleep onset time was 4.6 minutes and the sleep efficiency was 94.8%. The total sleep time was 382 minutes. Stage REM latency was 297.0 minutes. The patient spent 7.9% of the night in stage N1 sleep, 75.4% in stage N2 sleep, 0.0% in stage N3 and 16.8% in REM.  Alpha intrusion was absent. Supine sleep was 64.59%.  RESPIRATORY PARAMETERS The overall apnea/hypopnea index (AHI) was 11.8 per hour. There were 0 total apneas, including 0 obstructive, 0 central and 0 mixed apneas. There were 75 hypopneas and 22 RERAs. The AHI during Stage REM sleep was 15.9 per hour. AHI while supine was 8.5 per hour. The mean oxygen saturation was 91.1%. The minimum SpO2 during sleep was 81.0%. moderate snoring was noted during this study.      CARDIAC DATA The 2 lead EKG demonstrated sinus rhythm. The mean heart rate was 54.8 beats per minute. Other EKG findings include:  None.  LEG MOVEMENT DATA  The total PLMS were 0 with a resulting PLMS index of 0.0. Associated arousal with leg movement index was 0.8 .      IMPRESSIONS         - Mild obstructive sleep apnea occurred during this study (AHI = 11.8/h).         - Mild oxygen desaturation was noted during this study (Min O2 = 81.0%).         - The patient snored with moderate snoring volume.         - No cardiac abnormalities were noted during this study.         - Clinically significant periodic limb movements did not occur during sleep. No significant associated arousals.      DIAGNOSIS         - Obstructive Sleep Apnea (G47.33)         - Nocturnal Hypoxemia (G47.36)      RECOMMENDATIONS         - Therapeutic CPAP titration to determine optimal pressure required to alleviate sleep disordered breathing.         - Avoid alcohol, sedatives and other CNS depressants that may worsen sleep apnea and disrupt normal sleep architecture.         - Sleep hygiene should be reviewed to assess factors that may improve sleep quality.         - Weight management and regular exercise should be initiated or continued if appropriate.   [Electronically signed] 01/13/2022 05:37 AM  Fransico Him MD, ABSM Diplomate, American Board of Sleep Medicine

## 2022-01-17 ENCOUNTER — Other Ambulatory Visit: Payer: Self-pay

## 2022-01-17 ENCOUNTER — Ambulatory Visit: Payer: PRIVATE HEALTH INSURANCE | Admitting: Infectious Disease

## 2022-01-17 ENCOUNTER — Encounter: Payer: Self-pay | Admitting: Infectious Disease

## 2022-01-17 VITALS — BP 131/81 | HR 62 | Temp 97.7°F | Wt 258.0 lb

## 2022-01-17 DIAGNOSIS — M00052 Staphylococcal arthritis, left hip: Secondary | ICD-10-CM | POA: Diagnosis not present

## 2022-01-17 DIAGNOSIS — M8668 Other chronic osteomyelitis, other site: Secondary | ICD-10-CM

## 2022-01-17 DIAGNOSIS — Z22322 Carrier or suspected carrier of Methicillin resistant Staphylococcus aureus: Secondary | ICD-10-CM | POA: Diagnosis not present

## 2022-01-17 DIAGNOSIS — I330 Acute and subacute infective endocarditis: Secondary | ICD-10-CM

## 2022-01-17 DIAGNOSIS — I469 Cardiac arrest, cause unspecified: Secondary | ICD-10-CM

## 2022-01-17 DIAGNOSIS — R55 Syncope and collapse: Secondary | ICD-10-CM

## 2022-01-17 NOTE — Progress Notes (Signed)
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   MRN: [id number]  HPI  61 y.o. female with MRSA bacteremia and septic SI joint also with murmur,.  She had a 2D echocardiogram and a transesophageal echocardiogram which were both negative for endocarditis.  After her initial cultures on 30 April were positive for MRSA her repeat blood cultures on the third of May were not growing any organism and never did.  However would not reach day 5 of cultures being finalized.  On the day in question she had lost IV access and her pain was not capable of being controlled apparently without IV Dilaudid.  Therefore I recommended getting another set of blood cultures prior to placement of the PICC line that day.  Unfortunately that culture that was done turned out to be only a single culture and did itself again grow MRSA.  My partner Dr. Baxter Flattery and Dr. Linus Salmons was automatically consulted and saw the patient again contemplated PICC line removal.  Repeat blood cultures were done and were negative and PICC line was not removed.  My own approach would have been to remove the PICC line if necessary bridger with oral therapy prior to placing a new PICC line provided her pain could have been controlled.  In the interim she was found to have on CT scan new hypoattenuating area in the left iliac muscle concerning for a punctate foci of gas with 6 x 3.1 x 1.6 cm with concern for phlegmon near the left sacroiliac joint.  Interventional radiology attempted to aspirate this area but were unsuccessful.  She is then continued on daptomycin and was discharged to home with home IV antibiotics.  Hip pain  had continued to improve when I saw her last but still was having some pain in her groin that she attributed to the attempt to aspirate the phlegmon   I ordered an MRI of her hip to reassess the area of prior phlegmonous changes.  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  Patient continued on doxycycline in the interim.  I last saw her inflammatory markers did come down.  At her last visit she was telling me about pain in her neck that she says she has had since she was hospitalized particular if she tries to turn her head to the right to look behind her.  She does have more limited range of motion turning her head to the right she does not have tenderness over sternoclavicular joint of the muscles themselves the shoulder joint range of motion is   I obtained an MRI of the C-spine with and without contrast.  This showed no evidence of discitis or epidural abscess in the cervical spine there was some multilevel facet arthropathy and edema and enhancement of the C3-C4 facet joint thought to be degenerative with septic arthritis being a much less likely possibility there is also some mild neural foraminal stenosis at C3 4 and C4-5, but no spinal stenosis.  She is continued on doxycycline.  She continued to have low back pain and some hip pain but more so when she is working although she also feels like it  is better when she is moving rather than sitting still.  She had a complicated stay recently in the hospital with a PEA arrest and prolonged resuscitation.  Since then she is being worked up closely by Dr. [name] with cardiology.  She had a sleep study done recently.  She says she is fallen several times but not due to syncopal events.  She still has hip pain and back pain.  She has tolerated the doxycycline without difficulty.             Past Medical History:  Diagnosis Date   Amenorrhea     irreg cycle   Anxiety    Arthritis    Asthma    Chronic osteomyelitis of sacrum ([place]) 11/14/2021   MRSA infection    Myofascitis 03/04/2021   Neck pain on right side 05/16/2021   Obesity    Phlegmon 01/07/2021   Sacral osteomyelitis ([place]) 03/04/2021    Past Surgical History:  Procedure Laterality Date   COLONOSCOPY WITH PROPOFOL N/A 01/08/2014   Procedure: COLONOSCOPY WITH PROPOFOL;  Surgeon: [name], MD;  Location: [facility] ENDOSCOPY;  Service: Endoscopy;  Laterality: N/A;   DILATION AND CURETTAGE OF UTERUS  1990   LAPAROSCOPIC CHOLECYSTECTOMY  1994   mrsa     had to have area cut out. abdomen   TEE WITHOUT CARDIOVERSION N/A 12/27/2020   Procedure: TRANSESOPHAGEAL ECHOCARDIOGRAM (TEE);  Surgeon: [name], MD;  Location: [facility] ENDOSCOPY;  Service: Cardiovascular;  Laterality: N/A;    Family History  Problem Relation Age of Onset   Cancer Mother        pancreatic   Diabetes Mother    Hypertension Mother    Heart attack Father    Heart attack Maternal Grandmother    Diabetes Brother    Cancer Maternal Aunt        uterine   Diabetes Maternal Aunt    Colon cancer Neg Hx       Social History   Socioeconomic History   Marital status: Divorced    Spouse name: Not on file   Number of children: Not on file   Years of education: Not on file   Highest education level: Not on file  Occupational History   Not on file  Tobacco Use   Smoking status: Never   Smokeless tobacco: Never  Vaping Use   Vaping Use: Never used  Substance and Sexual Activity   Alcohol use: No   Drug use: No   Sexual activity: Yes    Partners: Female    Birth control/protection: Surgical    Comment: btl  Other Topics Concern   Not on file  Social History Narrative   Not on file   Social Determinants of Health   Financial Resource Strain: Not on file  Food Insecurity: Not on file  Transportation Needs: Not on file  Physical Activity: Not on file  Stress: Not on file  Social  Connections: Not on file    No Known Allergies   Current Outpatient Medications:    albuterol (VENTOLIN HFA) 108 (90 Base) MCG/ACT inhaler, Inhale 2 puffs into the lungs every 4 (four) hours as needed for shortness of breath or wheezing., Disp: , Rfl:    ALPRAZolam (XANAX) 0.5 MG tablet, Take 1 mg by mouth at bedtime. For anxiety, Disp: , Rfl:    Calcium Carbonate-Vitamin D (CALCIUM 600+D3 PO), Take 1 tablet by mouth 2 (two) times daily., Disp: , Rfl:    citalopram (CELEXA) 40 MG tablet,  Take 40 mg by mouth every morning., Disp: , Rfl:    clobetasol cream (TEMOVATE) 2.62 %, Apply 1 application topically at bedtime. Around lips, Disp: , Rfl:    doxycycline (VIBRA-TABS) 100 MG tablet, Take 1 tablet (100 mg total) by mouth 2 (two) times daily., Disp: 60 tablet, Rfl: 11   Doxylamine Succinate, Sleep, (SLEEP AID PO), Take by mouth., Disp: , Rfl:    fish oil-omega-3 fatty acids 1000 MG capsule, Take 1 g by mouth 2 (two) times daily., Disp: , Rfl:    gabapentin (NEURONTIN) 100 MG capsule, Take 300 mg by mouth at bedtime., Disp: , Rfl:    Glucosamine-Chondroit-Vit C-Mn (GLUCOSAMINE 1500 COMPLEX PO), Take 1 capsule by mouth 2 (two) times daily., Disp: , Rfl:    methocarbamol (ROBAXIN) 750 MG tablet, Take 750 mg by mouth 3 (three) times daily as needed for muscle spasms., Disp: , Rfl:    montelukast (SINGULAIR) 10 MG tablet, Take 10 mg by mouth every morning. , Disp: , Rfl:    Multiple Vitamin (MULTIVITAMIN WITH MINERALS) TABS tablet, Take 1 tablet by mouth daily., Disp: , Rfl:    omeprazole (PRILOSEC) 20 MG capsule, Take 1 capsule (20 mg total) by mouth daily before breakfast., Disp: 30 capsule, Rfl: 0   oxyCODONE-acetaminophen (PERCOCET) 10-325 MG per tablet, Take 1 tablet by mouth every 6 (six) hours as needed for pain. , Disp: , Rfl:    telmisartan (MICARDIS) 40 MG tablet, Take 40 mg by mouth daily., Disp: , Rfl:    vitamin B-12 (CYANOCOBALAMIN) 1000 MCG tablet, Take 1,000 mcg by mouth daily., Disp:  , Rfl:    polyethylene glycol (MIRALAX / GLYCOLAX) 17 g packet, Take 17 g by mouth daily as needed for severe constipation. (Patient not taking: Reported on 01/17/2022), Disp: 14 each, Rfl: 2    Review of Systems  Constitutional:  Negative for activity change, appetite change, chills, diaphoresis, fatigue, fever and unexpected weight change.  HENT:  Negative for congestion, rhinorrhea, sinus pressure, sneezing, sore throat and trouble swallowing.   Eyes:  Negative for photophobia and visual disturbance.  Respiratory:  Negative for cough, chest tightness, shortness of breath, wheezing and stridor.   Cardiovascular:  Negative for chest pain, palpitations and leg swelling.  Gastrointestinal:  Negative for abdominal distention, abdominal pain, anal bleeding, blood in stool, constipation, diarrhea, nausea and vomiting.  Genitourinary:  Negative for difficulty urinating, dysuria, flank pain and hematuria.  Musculoskeletal:  Positive for arthralgias and back pain. Negative for gait problem, joint swelling and myalgias.  Skin:  Negative for color change, pallor, rash and wound.  Neurological:  Negative for dizziness, tremors, weakness and light-headedness.  Hematological:  Negative for adenopathy. Does not bruise/bleed easily.  Psychiatric/Behavioral:  Negative for agitation, behavioral problems, confusion, decreased concentration, dysphoric mood and sleep disturbance.       Objective:   Physical Exam Constitutional:      General: She is not in acute distress.    Appearance: Normal appearance. She is well-developed. She is not ill-appearing or diaphoretic.  HENT:     Head: Normocephalic and atraumatic.     Right Ear: Hearing and external ear normal.     Left Ear: Hearing and external ear normal.     Nose: No nasal deformity or rhinorrhea.  Eyes:     General: No scleral icterus.    Conjunctiva/sclera: Conjunctivae normal.     Right eye: Right conjunctiva is not injected.     Left eye: Left  conjunctiva is not injected.  Pupils: Pupils are equal, round, and reactive to light.  Neck:     Vascular: No JVD.  Cardiovascular:     Rate and Rhythm: Normal rate and regular rhythm.     Heart sounds: S1 normal and S2 normal.    No friction rub.  Pulmonary:     Effort: No respiratory distress.  Abdominal:     General: Bowel sounds are normal. There is no distension.     Palpations: Abdomen is soft.     Tenderness: There is no abdominal tenderness.  Musculoskeletal:        General: Normal range of motion.     Right shoulder: Normal.     Left shoulder: Normal.     Cervical back: Normal range of motion and neck supple.     Right hip: Normal.     Left hip: Normal.     Right knee: Normal.     Left knee: Normal.  Lymphadenopathy:     Head:     Right side of head: No submandibular, preauricular or posterior auricular adenopathy.     Left side of head: No submandibular, preauricular or posterior auricular adenopathy.     Cervical: No cervical adenopathy.     Right cervical: No superficial or deep cervical adenopathy.    Left cervical: No superficial or deep cervical adenopathy.  Skin:    General: Skin is warm and dry.     Coloration: Skin is not pale.     Findings: No abrasion, bruising, ecchymosis, erythema, lesion or rash.     Nails: There is no clubbing.  Neurological:     General: No focal deficit present.     Mental Status: She is alert and oriented to person, place, and time.     Sensory: No sensory deficit.     Coordination: Coordination normal.     Gait: Gait normal.  Psychiatric:        Attention and Perception: She is attentive.        Speech: Speech normal.        Behavior: Behavior normal. Behavior is cooperative.        Thought Content: Thought content normal.        Judgment: Judgment normal.       Assessment & Plan:  MRSA bacteremia with septic SI joint and adjacent phlegmon and osteomyelitis involving bones of sacrum and ilium along with fasciitis and  pyomyositis involving left gluteus muscle and piriformis  I am checking a sed rate CRP BMP and CBC with differential.  I have ordered an MRI of the hip with contrast on the left side to reevaluate sacrum and ilium.  Hopefully her osteomyelitis has resolved radiographically and we can take her off doxycycline.  In the interim I am continue her doxycycline  PEA arrest: being worked up by Dr. [name]

## 2022-01-18 LAB — BASIC METABOLIC PANEL WITH GFR
BUN/Creatinine Ratio: 28 (calc) — ABNORMAL HIGH (ref 6–22)
BUN: 13 mg/dL (ref 7–25)
CO2: 26 mmol/L (ref 20–32)
Calcium: 9.4 mg/dL (ref 8.6–10.4)
Chloride: 103 mmol/L (ref 98–110)
Creat: 0.47 mg/dL — ABNORMAL LOW (ref 0.50–1.05)
Glucose, Bld: 117 mg/dL — ABNORMAL HIGH (ref 65–99)
Potassium: 3.5 mmol/L (ref 3.5–5.3)
Sodium: 140 mmol/L (ref 135–146)
eGFR: 109 mL/min/{1.73_m2} (ref >=60)

## 2022-01-18 LAB — CBC WITH DIFFERENTIAL/PLATELET
Absolute Monocytes: 449 cells/uL (ref 200–950)
Basophils Absolute: 20 cells/uL (ref 0–200)
Basophils Relative: 0.3 %
Eosinophils Absolute: 152 cells/uL (ref 15–500)
Eosinophils Relative: 2.3 %
HCT: 36.6 % (ref 35.0–45.0)
Hemoglobin: 12.3 g/dL (ref 11.7–15.5)
Lymphs Abs: 2732 cells/uL (ref 850–3900)
MCH: 30.7 pg (ref 27.0–33.0)
MCHC: 33.6 g/dL (ref 32.0–36.0)
MCV: 91.3 fL (ref 80.0–100.0)
MPV: 10.1 fL (ref 7.5–12.5)
Monocytes Relative: 6.8 %
Neutro Abs: 3247 cells/uL (ref 1500–7800)
Neutrophils Relative %: 49.2 %
Platelets: 257 10*3/uL (ref 140–400)
RBC: 4.01 10*6/uL (ref 3.80–5.10)
RDW: 12.7 % (ref 11.0–15.0)
Total Lymphocyte: 41.4 %
WBC: 6.6 10*3/uL (ref 3.8–10.8)

## 2022-01-18 LAB — SEDIMENTATION RATE: Sed Rate: 25 mm/h (ref 0–30)

## 2022-01-18 LAB — C-REACTIVE PROTEIN: CRP: 1.2 mg/L (ref <8.0)

## 2022-01-20 ENCOUNTER — Telehealth: Payer: Self-pay | Admitting: *Deleted

## 2022-01-20 DIAGNOSIS — G4733 Obstructive sleep apnea (adult) (pediatric): Secondary | ICD-10-CM

## 2022-01-20 NOTE — Telephone Encounter (Signed)
-----   Message from Lauralee Evener, [place] sent at 01/13/2022  8:37 AM EDT -----  ----- Message ----- From: Sueanne Margarita, MD Sent: 01/13/2022   5:40 AM EDT To: Cv Div Sleep Studies  Please let patient know that they have sleep apnea.  Recommend therapeutic CPAP titration for treatment of patient's sleep disordered breathing.  If unable to perform an in lab titration then initiate ResMed auto CPAP from 4 to 15cm H2O with heated humidity and mask of choice and overnight pulse ox on CPAP.   Please let patient know that they have sleep apnea.  Recommend therapeutic CPAP titration for treatment of patient's sleep disordered breathing.  If unable to perform an in lab titration then initiate ResMed auto CPAP from 4 to 15cm H2O with heated humidity and mask of choice and overnight pulse ox on CPAP.

## 2022-01-20 NOTE — Telephone Encounter (Signed)
The patient has been notified of the result. Left detailed message on voicemail and informed patient to call back with questions 01/20/2022 4:22 PM

## 2022-01-25 NOTE — Telephone Encounter (Signed)
Return call: The patient has been notified of the result and verbalized understanding.  All questions (if any) were answered. Nicole Hines, [place] 01/25/2022 5:08 PM    Titration to schedule

## 2022-01-26 ENCOUNTER — Ambulatory Visit (HOSPITAL_COMMUNITY)
Admission: RE | Admit: 2022-01-26 | Discharge: 2022-01-26 | Disposition: A | Discharge status: Home / Self Care | Payer: PRIVATE HEALTH INSURANCE | Source: Ambulatory Visit | Attending: Infectious Disease | Admitting: Infectious Disease

## 2022-01-26 DIAGNOSIS — M8668 Other chronic osteomyelitis, other site: Secondary | ICD-10-CM

## 2022-01-26 MED ORDER — GADOBUTROL 1 MMOL/ML IV SOLN
10.0000 mL | Freq: Once | INTRAVENOUS | Status: AC | PRN
  Administered 2022-01-26: 10 mL via INTRAVENOUS

## 2022-02-09 NOTE — Addendum Note (Signed)
Addended by: [name] on: 02/09/2022 05:16 PM   Modules accepted: Orders

## 2022-03-12 ENCOUNTER — Other Ambulatory Visit: Payer: Self-pay | Admitting: Infectious Disease

## 2022-03-27 ENCOUNTER — Encounter: Payer: Self-pay | Admitting: Infectious Disease

## 2022-03-27 ENCOUNTER — Other Ambulatory Visit: Payer: Self-pay

## 2022-03-27 ENCOUNTER — Ambulatory Visit (INDEPENDENT_AMBULATORY_CARE_PROVIDER_SITE_OTHER): Payer: PRIVATE HEALTH INSURANCE | Admitting: Infectious Disease

## 2022-03-27 VITALS — BP 148/83 | HR 62 | Resp 16 | Ht 67.5 in | Wt 255.0 lb

## 2022-03-27 DIAGNOSIS — M00052 Staphylococcal arthritis, left hip: Secondary | ICD-10-CM | POA: Diagnosis not present

## 2022-03-27 DIAGNOSIS — Z22322 Carrier or suspected carrier of Methicillin resistant Staphylococcus aureus: Secondary | ICD-10-CM

## 2022-03-27 DIAGNOSIS — M4628 Osteomyelitis of vertebra, sacral and sacrococcygeal region: Secondary | ICD-10-CM

## 2022-03-27 DIAGNOSIS — M8668 Other chronic osteomyelitis, other site: Secondary | ICD-10-CM | POA: Diagnosis not present

## 2022-03-27 DIAGNOSIS — M533 Sacrococcygeal disorders, not elsewhere classified: Secondary | ICD-10-CM

## 2022-03-27 DIAGNOSIS — I469 Cardiac arrest, cause unspecified: Secondary | ICD-10-CM

## 2022-03-27 HISTORY — DX: Sacrococcygeal disorders, not elsewhere classified: M53.3

## 2022-03-27 NOTE — Progress Notes (Signed)
Subjective:  Chief complaint: Left-sided hip pain also radiating to the groin      patient ID: Nicole Hines, female    DOB: 1960-10-14, 61 y.o.   MRN: [id number]  HPI  61 y.o. female with MRSA bacteremia and septic SI joint also with murmur,.  She had a 2D echocardiogram and a transesophageal echocardiogram which were both negative for endocarditis.  After her initial cultures on 18 December 2020  were positive for MRSA her repeat blood cultures on the third of May were not growing any organism and never did.  However would not reach day 5 of cultures being finalized.  On the day in question she had lost IV access and her pain was not capable of being controlled apparently without IV Dilaudid.  Therefore I recommended getting another set of blood cultures prior to placement of the PICC line that day.  Unfortunately that culture that was done turned out to be only a single culture and did itself again grow MRSA.  My partner Dr. Baxter Flattery and Dr. Linus Salmons was automatically consulted and saw the patient again contemplated PICC line removal.  Repeat blood cultures were done and were negative and PICC line was not removed.  My own approach would have been to remove the PICC line if necessary bridger with oral therapy prior to placing a new PICC line provided her pain could have been controlled.  In the interim she was found to have on CT scan new hypoattenuating area in the left iliac muscle concerning for a punctate foci of gas with 6 x 3.1 x 1.6 cm with concern for phlegmon near the left sacroiliac joint.  Interventional radiology attempted to aspirate this area but were unsuccessful.  She is then continued on daptomycin and was discharged to home with home IV antibiotics.  Hip pain  had continued to improve when I saw her last but still was having some pain in her groin that she attributed to the attempt to aspirate the phlegmon   I ordered an MRI of her hip to reassess the area of prior  phlegmonous changes.  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  Patient continued on doxycycline in the interim.  I last saw her inflammatory markers did come down.  At one of prior visits she was  telling me about pain in her neck that she says she has had since she was hospitalized particular if she tries to turn her head to the right to look behind her.  She did have more limited range of motion turning her head to the right she did  not have tenderness over sternoclavicular joint of the muscles themselves the shoulder joint range of motion is   I obtained an MRI of the C-spine with and without contrast.  This showed no evidence of discitis or epidural abscess in the cervical spine there was some multilevel facet arthropathy and edema and enhancement of the C3-C4 facet joint thought to be degenerative with septic arthritis being a much less likely possibility there is also some mild neural foraminal stenosis at C3 4 and C4-5, but no spinal stenosis.  She is continued on doxycycline.  She continued to have low back pain and some hip pain but  more so when she is working although she also feels like it is better when she is moving rather than sitting still.  She had a complicated stay recently in the hospital with a PEA arrest and prolonged resuscitation.  Since then she is being worked up closely by Dr. [name] with cardiology.  She had a sleep study done    At her previous visit she reiterated that she had fallen several times but not due to syncopal events.  Still had hip and back pain and we ended up getting an MRI of the left hip  with and without contrast.   MRI showed  some one of the SI joint some irregularity and bone marrow edema along the sacral aspect.  Radiology read this is possibly being consistent with inflammatory arthritis versus septic arthritis.  I suggested a dedicated MRI of the SI joints to be performed.  There was moderate osteoarthritis of left hip but no evidence of septic left hip arthritis.  There is also subcortical bone marrow edema in the greater tuberosity of the insertion of the gluteus medius tendon felt c/w enthesistis and mild tendinosis of the left hamstring, with a partial tear.  Since I last saw her she is actually having worsening pain in the left hip with some radiation into the groin.  She is not having fevers chills or systemic symptoms.              Past Medical History:  Diagnosis Date   Amenorrhea    irreg cycle   Anxiety    Arthritis    Asthma    Chronic osteomyelitis of sacrum ([place]) 11/14/2021   MRSA infection    Myofascitis 03/04/2021   Neck pain on right side 05/16/2021   Obesity    Phlegmon 01/07/2021   Sacral osteomyelitis ([place]) 03/04/2021    Past Surgical History:  Procedure Laterality Date   COLONOSCOPY WITH PROPOFOL N/A 01/08/2014   Procedure: COLONOSCOPY WITH PROPOFOL;  Surgeon: [name], MD;  Location: [facility] ENDOSCOPY;  Service: Endoscopy;  Laterality: N/A;   DILATION AND CURETTAGE OF UTERUS  1990   LAPAROSCOPIC CHOLECYSTECTOMY  1994   mrsa     had to have area cut out. abdomen   TEE WITHOUT CARDIOVERSION N/A 12/27/2020   Procedure: TRANSESOPHAGEAL ECHOCARDIOGRAM (TEE);  Surgeon: [name], MD;  Location: [facility] ENDOSCOPY;  Service: Cardiovascular;  Laterality: N/A;    Family History  Problem Relation Age of Onset   Cancer Mother        pancreatic   Diabetes Mother    Hypertension Mother    Heart attack Father    Heart attack Maternal Grandmother    Diabetes Brother    Cancer Maternal Aunt        uterine   Diabetes Maternal Aunt    Colon cancer Neg Hx       Social  History   Socioeconomic History   Marital status: Divorced    Spouse name: Not on file   Number of children: Not on file   Years of education: Not on file   Highest education level: Not on file  Occupational History   Not on file  Tobacco Use   Smoking status: Never   Smokeless tobacco: Never  Vaping Use   Vaping Use: Never used  Substance and Sexual Activity   Alcohol use: No   Drug use: No   Sexual activity: Yes    Partners: Female    Birth control/protection: Surgical    Comment:  btl  Other Topics Concern   Not on file  Social History Narrative   Not on file   Social Determinants of Health   Financial Resource Strain: Not on file  Food Insecurity: Not on file  Transportation Needs: Not on file  Physical Activity: Not on file  Stress: Not on file  Social Connections: Not on file    No Known Allergies   Current Outpatient Medications:    albuterol (VENTOLIN HFA) 108 (90 Base) MCG/ACT inhaler, Inhale 2 puffs into the lungs every 4 (four) hours as needed for shortness of breath or wheezing., Disp: , Rfl:    ALPRAZolam (XANAX) 0.5 MG tablet, Take 1 mg by mouth at bedtime. For anxiety, Disp: , Rfl:    Calcium Carbonate-Vitamin D (CALCIUM 600+D3 PO), Take 1 tablet by mouth 2 (two) times daily., Disp: , Rfl:    citalopram (CELEXA) 40 MG tablet, Take 40 mg by mouth every morning., Disp: , Rfl:    clobetasol cream (TEMOVATE) 1.49 %, Apply 1 application topically at bedtime. Around lips, Disp: , Rfl:    doxycycline (VIBRA-TABS) 100 MG tablet, TAKE 1 TABLET BY MOUTH TWICE A DAY, Disp: 60 tablet, Rfl: 1   Doxylamine Succinate, Sleep, (SLEEP AID PO), Take by mouth., Disp: , Rfl:    fish oil-omega-3 fatty acids 1000 MG capsule, Take 1 g by mouth 2 (two) times daily., Disp: , Rfl:    gabapentin (NEURONTIN) 100 MG capsule, Take 300 mg by mouth at bedtime., Disp: , Rfl:    Glucosamine-Chondroit-Vit C-Mn (GLUCOSAMINE 1500 COMPLEX PO), Take 1 capsule by mouth 2 (two) times daily.,  Disp: , Rfl:    methocarbamol (ROBAXIN) 750 MG tablet, Take 750 mg by mouth 3 (three) times daily as needed for muscle spasms., Disp: , Rfl:    montelukast (SINGULAIR) 10 MG tablet, Take 10 mg by mouth every morning. , Disp: , Rfl:    Multiple Vitamin (MULTIVITAMIN WITH MINERALS) TABS tablet, Take 1 tablet by mouth daily., Disp: , Rfl:    omeprazole (PRILOSEC) 20 MG capsule, Take 1 capsule (20 mg total) by mouth daily before breakfast., Disp: 30 capsule, Rfl: 0   oxyCODONE-acetaminophen (PERCOCET) 10-325 MG per tablet, Take 1 tablet by mouth every 6 (six) hours as needed for pain. , Disp: , Rfl:    polyethylene glycol (MIRALAX / GLYCOLAX) 17 g packet, Take 17 g by mouth daily as needed for severe constipation., Disp: 14 each, Rfl: 2   telmisartan (MICARDIS) 40 MG tablet, Take 40 mg by mouth daily., Disp: , Rfl:    vitamin B-12 (CYANOCOBALAMIN) 1000 MCG tablet, Take 1,000 mcg by mouth daily., Disp: , Rfl:     Review of Systems  Constitutional:  Negative for activity change, appetite change, chills, diaphoresis, fatigue, fever and unexpected weight change.  HENT:  Negative for congestion, rhinorrhea, sinus pressure, sneezing, sore throat and trouble swallowing.   Eyes:  Negative for photophobia and visual disturbance.  Respiratory:  Negative for cough, chest tightness, shortness of breath, wheezing and stridor.   Cardiovascular:  Negative for chest pain, palpitations and leg swelling.  Gastrointestinal:  Negative for abdominal distention, abdominal pain, anal bleeding, blood in stool, constipation, diarrhea, nausea and vomiting.  Genitourinary:  Negative for difficulty urinating, dysuria, flank pain and hematuria.  Musculoskeletal:  Positive for arthralgias and gait problem. Negative for back pain, joint swelling and myalgias.  Skin:  Negative for color change, pallor, rash and wound.  Neurological:  Negative for dizziness, tremors, weakness, light-headedness and headaches.  Hematological:  Negative for adenopathy. Does not bruise/bleed easily.  Psychiatric/Behavioral:  Negative for agitation, behavioral problems, confusion, decreased concentration, dysphoric mood, sleep disturbance and suicidal ideas.        Objective:   Physical Exam Constitutional:      General: She is not in acute distress.    Appearance: Normal appearance. She is well-developed. She is not ill-appearing or diaphoretic.  HENT:     Head: Normocephalic and atraumatic.     Right Ear: Hearing and external ear normal.     Left Ear: Hearing and external ear normal.     Nose: No nasal deformity or rhinorrhea.  Eyes:     General: No scleral icterus.    Conjunctiva/sclera: Conjunctivae normal.     Right eye: Right conjunctiva is not injected.     Left eye: Left conjunctiva is not injected.     Pupils: Pupils are equal, round, and reactive to light.  Neck:     Vascular: No JVD.  Cardiovascular:     Rate and Rhythm: Normal rate and regular rhythm.     Heart sounds: S1 normal and S2 normal.  Abdominal:     General: There is no distension.     Palpations: Abdomen is soft.  Musculoskeletal:     Right shoulder: Normal.     Left shoulder: Normal.     Cervical back: Normal range of motion and neck supple.     Right hip: Normal.     Left hip: Normal.     Right knee: Normal.     Left knee: Normal.  Lymphadenopathy:     Head:     Right side of head: No submandibular, preauricular or posterior auricular adenopathy.     Left side of head: No submandibular, preauricular or posterior auricular adenopathy.     Cervical: No cervical adenopathy.     Right cervical: No superficial or deep cervical adenopathy.    Left cervical: No superficial or deep cervical adenopathy.  Skin:    General: Skin is warm and dry.     Coloration: Skin is not pale.     Findings: No abrasion, bruising, ecchymosis, erythema, lesion or rash.     Nails: There is no clubbing.  Neurological:     General: No focal deficit present.      Mental Status: She is alert and oriented to person, place, and time.     Sensory: No sensory deficit.     Coordination: Coordination normal.     Gait: Gait normal.  Psychiatric:        Attention and Perception: She is attentive.        Mood and Affect: Mood normal.        Speech: Speech normal.        Behavior: Behavior normal. Behavior is cooperative.        Thought Content: Thought content normal.        Judgment: Judgment normal.        Assessment & Plan:   MRSA bacteremia with septic SI joint and adjacent phlegmon and osteomyelitis seen on MRI involving bones of sacrum and ilium with fasciitis and pyomyositis involving left gluteus muscle and piriformis:  Most of her sites of infection have resolved radiographically.  The only area that is being commented now is the SI joint on her recent MRI left hip  Per radiology's recommendation I am getting an MRI with and without contrast of the sacroiliac joint to evaluate this area it bothers me she is having more pain  I will check ESR, CRP BMP w GFR and CBC w diff  We will continue her doxycycline for now follow-up in 2 months hopefully by that time she has had repeat MRI of the sacroiliac joint  PEA arrest: being worked up by Dr. [name], he is going to have his CPAP machine for OSA

## 2022-03-28 LAB — CBC WITH DIFFERENTIAL/PLATELET
Absolute Monocytes: 599 cells/uL (ref 200–950)
Basophils Absolute: 37 cells/uL (ref 0–200)
Basophils Relative: 0.5 %
Eosinophils Absolute: 212 cells/uL (ref 15–500)
Eosinophils Relative: 2.9 %
HCT: 36 % (ref 35.0–45.0)
Hemoglobin: 12 g/dL (ref 11.7–15.5)
Lymphs Abs: 2825 cells/uL (ref 850–3900)
MCH: 29.8 pg (ref 27.0–33.0)
MCHC: 33.3 g/dL (ref 32.0–36.0)
MCV: 89.3 fL (ref 80.0–100.0)
MPV: 10 fL (ref 7.5–12.5)
Monocytes Relative: 8.2 %
Neutro Abs: 3628 cells/uL (ref 1500–7800)
Neutrophils Relative %: 49.7 %
Platelets: 256 10*3/uL (ref 140–400)
RBC: 4.03 10*6/uL (ref 3.80–5.10)
RDW: 12.6 % (ref 11.0–15.0)
Total Lymphocyte: 38.7 %
WBC: 7.3 10*3/uL (ref 3.8–10.8)

## 2022-03-28 LAB — C-REACTIVE PROTEIN: CRP: 1.4 mg/L (ref <8.0)

## 2022-03-28 LAB — BASIC METABOLIC PANEL WITH GFR
BUN: 13 mg/dL (ref 7–25)
CO2: 29 mmol/L (ref 20–32)
Calcium: 9.9 mg/dL (ref 8.6–10.4)
Chloride: 102 mmol/L (ref 98–110)
Creat: 0.63 mg/dL (ref 0.50–1.05)
Glucose, Bld: 98 mg/dL (ref 65–99)
Potassium: 3.8 mmol/L (ref 3.5–5.3)
Sodium: 140 mmol/L (ref 135–146)
eGFR: 101 mL/min/{1.73_m2} (ref >=60)

## 2022-03-28 LAB — SEDIMENTATION RATE: Sed Rate: 29 mm/h (ref 0–30)

## 2022-04-05 ENCOUNTER — Ambulatory Visit (HOSPITAL_COMMUNITY)
Admission: RE | Admit: 2022-04-05 | Discharge: 2022-04-05 | Disposition: A | Discharge status: Home / Self Care | Payer: PRIVATE HEALTH INSURANCE | Source: Ambulatory Visit | Attending: Infectious Disease | Admitting: Infectious Disease

## 2022-04-05 DIAGNOSIS — M4628 Osteomyelitis of vertebra, sacral and sacrococcygeal region: Secondary | ICD-10-CM | POA: Diagnosis present

## 2022-04-05 MED ORDER — GADOBUTROL 1 MMOL/ML IV SOLN
10.0000 mL | Freq: Once | INTRAVENOUS | Status: AC | PRN
  Administered 2022-04-05: 10 mL via INTRAVENOUS

## 2022-04-06 ENCOUNTER — Ambulatory Visit (HOSPITAL_COMMUNITY): Payer: PRIVATE HEALTH INSURANCE | Attending: Cardiology

## 2022-04-06 DIAGNOSIS — I350 Nonrheumatic aortic (valve) stenosis: Secondary | ICD-10-CM | POA: Insufficient documentation

## 2022-04-06 LAB — ECHOCARDIOGRAM COMPLETE
AR max vel: 1.03 cm2
AV Area VTI: 1.06 cm2
AV Area mean vel: 1.03 cm2
AV Mean grad: 33.6 mmHg
AV Peak grad: 60.5 mmHg
Ao pk vel: 3.89 m/s
Area-P 1/2: 3.38 cm2
P 1/2 time: 567 msec
S' Lateral: 3.2 cm

## 2022-04-07 ENCOUNTER — Telehealth: Payer: Self-pay | Admitting: *Deleted

## 2022-04-07 ENCOUNTER — Telehealth: Payer: Self-pay

## 2022-04-07 DIAGNOSIS — I351 Nonrheumatic aortic (valve) insufficiency: Secondary | ICD-10-CM

## 2022-04-07 DIAGNOSIS — I340 Nonrheumatic mitral (valve) insufficiency: Secondary | ICD-10-CM

## 2022-04-07 DIAGNOSIS — I350 Nonrheumatic aortic (valve) stenosis: Secondary | ICD-10-CM

## 2022-04-07 NOTE — Telephone Encounter (Signed)
-----   Message from Truman Hayward, MD sent at 04/07/2022  4:28 PM EDT ----- Based on this MRI result and labs I feel comfortable with the patient stoppping her antibiotics and [place] seeing how she does OFF of them ----- Message ----- From: Interface, Rad Results In Sent: 04/07/2022   3:17 PM EDT To: Truman Hayward, MD

## 2022-04-07 NOTE — Telephone Encounter (Signed)
The patient has been notified of the result and verbalized understanding.  All questions (if any) were answered.  Pt aware that I will go ahead and place the order for repeat echo in 6 months in the system and send a message to our Echo Scheduler to call her back and arrange this appt for that time.  Pt verbalized understanding and agrees with this plan.

## 2022-04-07 NOTE — Telephone Encounter (Signed)
-----   Message from Sueanne Margarita, MD sent at 04/07/2022  4:26 PM EDT ----- Echo showed normal heart function and increased deafness of the heart muscle.  The atria are severely enlarged.  There is trivial leakiness of mitral valve and mild leakiness of the aortic valve.  There is moderate aortic valve stenosis.  No significant change from prior echo.  Repeat echo in 6 months.

## 2022-04-07 NOTE — Telephone Encounter (Signed)
Called patient to discuss MRI results and to have her stop antibiotics per MD. Patient okay with plan. Will call back with questions/ concerns. Nicole Hines, RMA

## 2022-04-10 ENCOUNTER — Ambulatory Visit (HOSPITAL_BASED_OUTPATIENT_CLINIC_OR_DEPARTMENT_OTHER): Payer: PRIVATE HEALTH INSURANCE | Attending: Cardiology | Admitting: Cardiology

## 2022-04-10 DIAGNOSIS — G4733 Obstructive sleep apnea (adult) (pediatric): Secondary | ICD-10-CM

## 2022-04-11 IMAGING — MR MR LUMBAR SPINE W/O CM
5 of 8 series · 23 of 48 positions shown · non-contrast
Comparison: CT Abdomen and Pelvis 12/18/2020. Lumbar MRI
12/11/2006.

CLINICAL DATA: 60-year-old female with low back pain. Evidence of
L4-L5 spinal stenosis on CT Abdomen and Pelvis yesterday. Possible
infection.

EXAM:
MRI LUMBAR SPINE WITHOUT CONTRAST
TECHNIQUE: Multiplanar, multisequence MR imaging of the lumbar spine was
performed. No intravenous contrast was administered.

[Series 5: T1 · sagittal · left · 4.0mm · 0.84mm/px · 3 of 17 slices shown (1 of 2)]
[im 1/17]
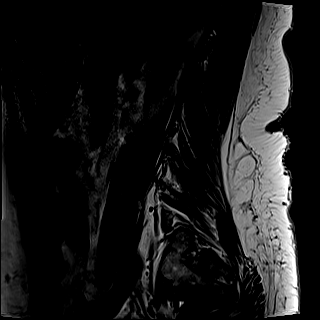
[im 9/17]
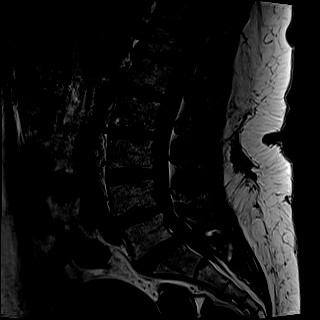
[im 17/17]
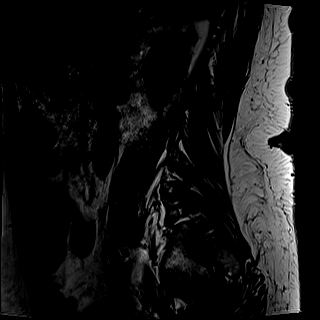

[Series 6: T2 · sagittal · left · 4.0mm · 0.81mm/px · 3 of 17 slices shown (1 of 2)]
[im 1/17]
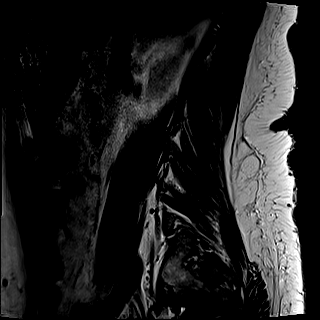
[im 9/17]
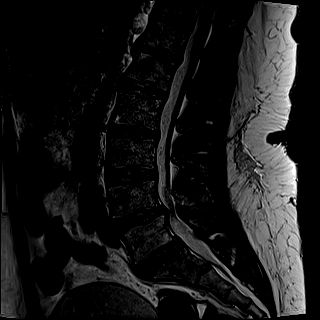
[im 17/17]
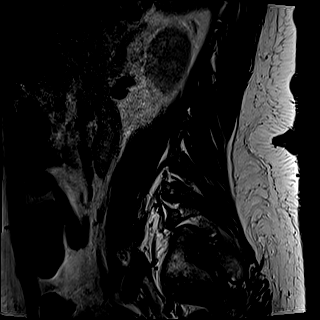

[Series 7: STIR · sagittal · left · 4.0mm · 0.51mm/px · 3 of 17 slices shown]
[im 1/17]
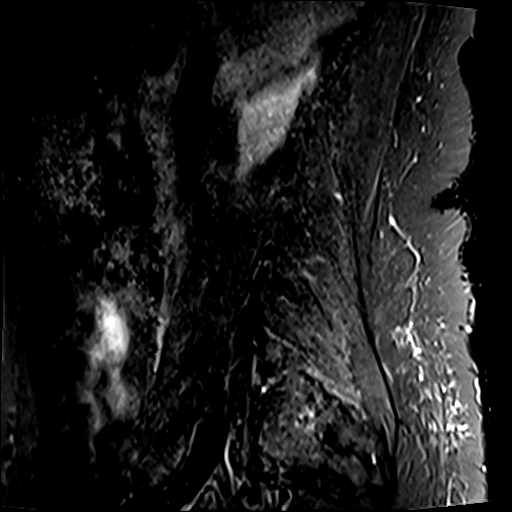
[im 9/17]
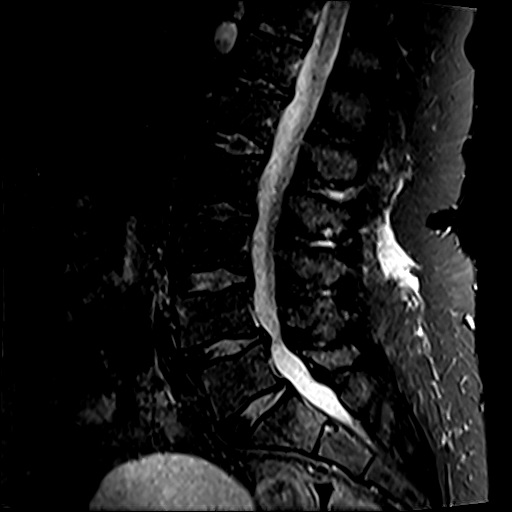
[im 17/17]
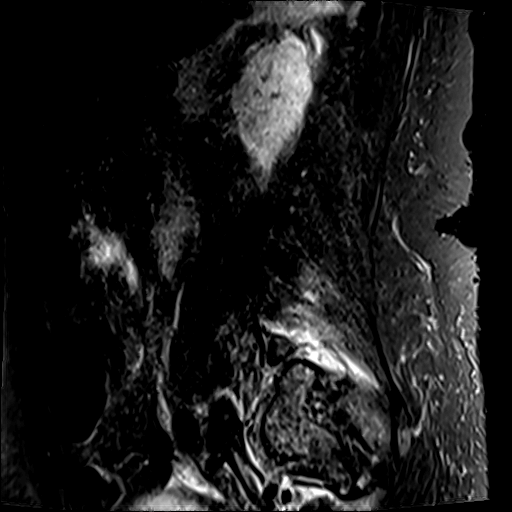

[Series 8: T2 · axial · left · 4.0mm · 0.62mm/px · z∈[+73,+274]mm · 7 of 43 slices shown (2 of 2)]
[im 1/43]
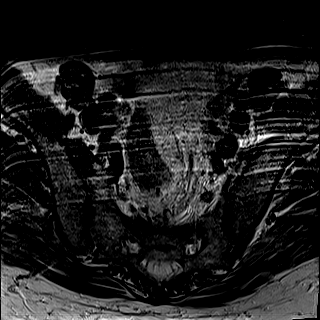
[im 8/43]
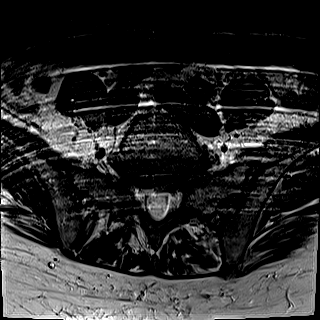
[im 15/43]
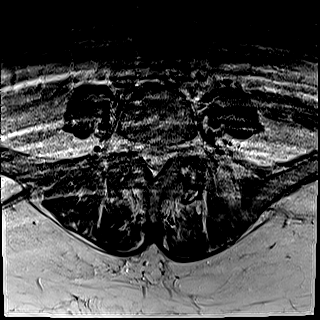
[im 22/43]
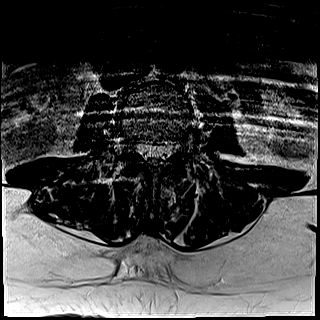
[im 29/43]
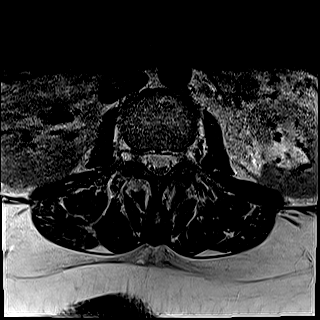
[im 36/43]
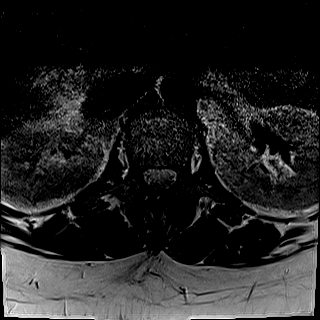
[im 43/43]
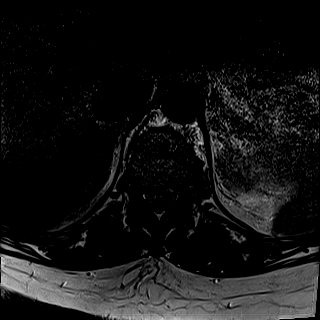

[Series 9: T1 · axial · left · 4.0mm · 0.39mm/px · z∈[+73,+274]mm · 7 of 43 slices shown (2 of 2)]
[im 1/43]
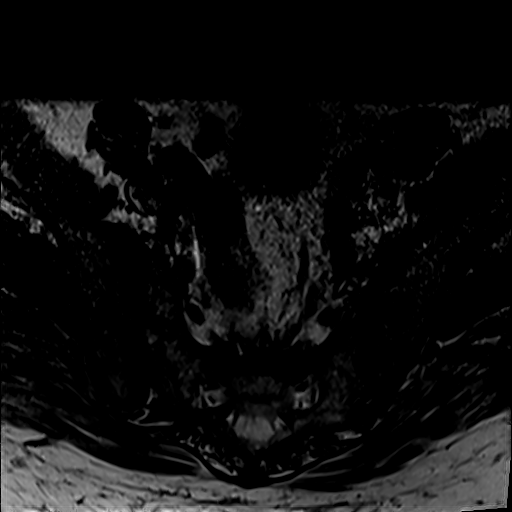
[im 8/43]
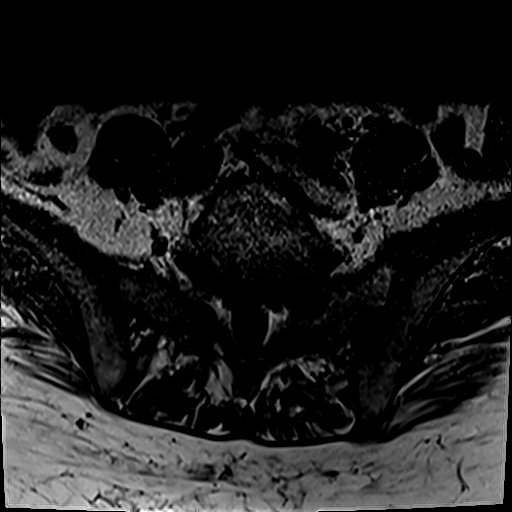
[im 15/43]
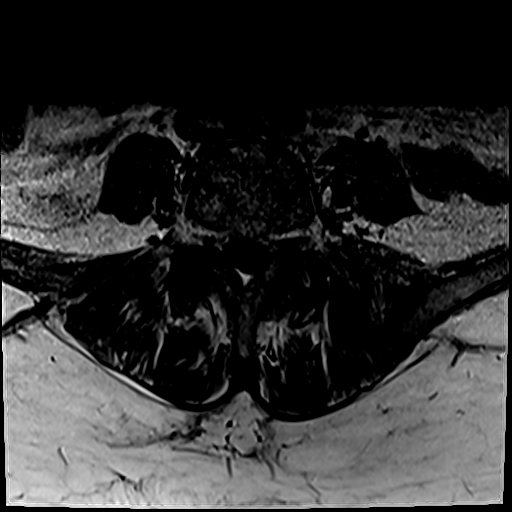
[im 22/43]
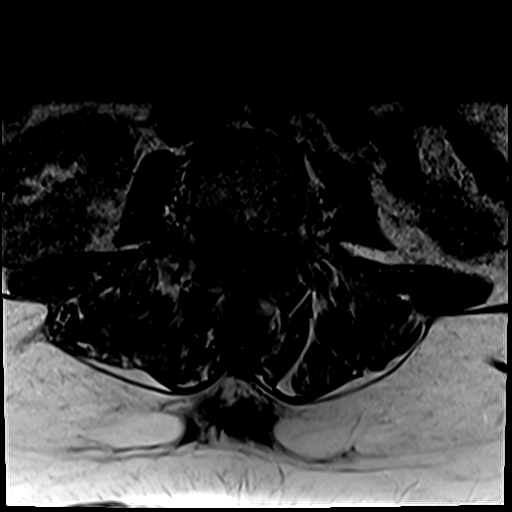
[im 29/43]
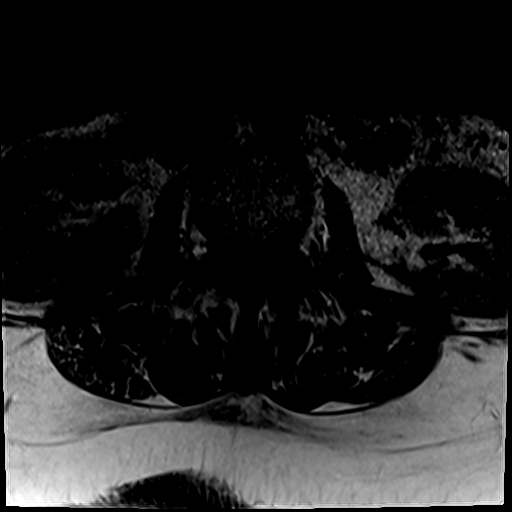
[im 36/43]
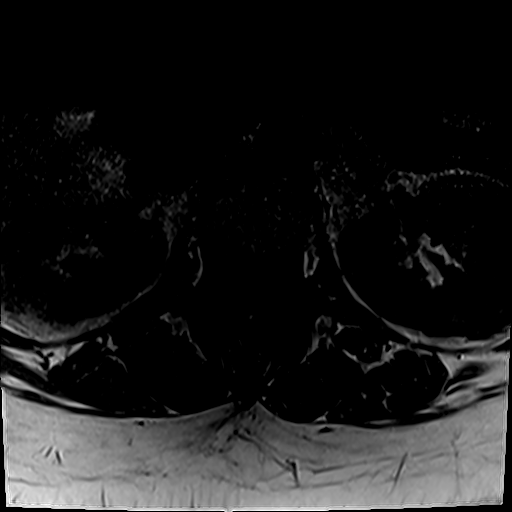
[im 43/43]
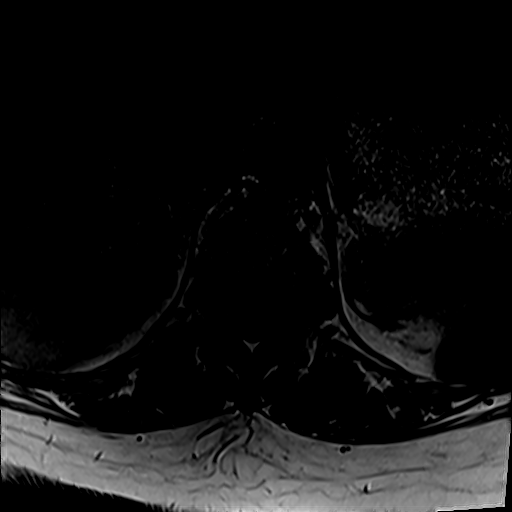

[23 of 48 positions shown; findings below may reference images not displayed]

FINDINGS: Segmentation:  Normal.

Alignment: Mildly increased lumbar lordosis since 4559. Mild grade 1
anterolisthesis of L4 on L5, 2-3 mm is new since that time. Similar
subtle anterolisthesis of L5 on S1.

Vertebrae: No marrow edema or evidence of acute osseous abnormality.
Visualized bone marrow signal is within normal limits. Intact
visible sacrum and SI joints.

Conus medullaris and cauda equina: Conus extends to the T12-L1
level. No lower spinal cord or conus signal abnormality.

Paraspinal and other soft tissues: Visible abdominal and pelvic
viscera appear stable since yesterday.

There is streaky inflammatory signal in the left lower lumbar
erector spinae muscles on series 7 images 13 and 17, more pronounced
at the L5 transverse process level. Negative lumbar paraspinal soft
tissues otherwise.

Disc levels:

T11-T12: Negative.

T12-L1:  Mild disc bulging.  No stenosis.

L1-L2: Negative disc. Mild facet hypertrophy. Mild degenerative
increased STIR signal in the interspinous ligament. No stenosis.

L2-L3: Circumferential, mostly foraminal disc bulging. Mild to
moderate facet hypertrophy greater on the left. Trace degenerative
facet and interspinous ligament fluid signal. No spinal stenosis.
Moderate bilateral L2 foraminal stenosis.

L3-L4: Mild disc bulging and facet hypertrophy. Mild left L3
foraminal stenosis.

L4-L5: Mild anterolisthesis with circumferential disc/pseudo disc.
Broad-based posterior component. Moderate ligament flavum and
moderate to severe facet hypertrophy. Moderate to severe spinal
stenosis. At least moderate bilateral lateral recess stenosis (L5
nerve levels). Mild to moderate left L4 neural foraminal stenosis.

L5-S1: Subtle anterolisthesis and disc bulging. Moderate facet
hypertrophy. No spinal or lateral recess stenosis. Mild to moderate
bilateral L5 foraminal stenosis.
IMPRESSION: 1. Inflammatory signal in the left lower erector spinae muscle at L5
and S1, and adjacent to the left L5 transverse process is
nonspecific but more resembles degenerative or posttraumatic muscle
edema than infection.
No paraspinal fluid collection. No evidence of discitis
osteomyelitis.

2. L4-L5 moderate to severe spinal, at least moderate lateral
recess, and up to moderate bilateral neural foraminal stenosis
related to disc and posterior element degeneration in the setting of
grade 1 spondylolisthesis.

3. Mild L5-S1 spondylolisthesis with up to moderate bilateral L5
foraminal stenosis.

4. L2-L3 disc bulging with moderate bilateral L2 neural foraminal
stenosis.

## 2022-04-11 NOTE — Procedures (Signed)
   Patient Name: Nicole Hines, Nicole Hines Date: 04/10/2022 Gender: Female D.O.B: [birth year] Age (years): 9 Referring Provider: Gwyndolyn Kaufman MD Height (inches): 68 Interpreting Physician: Fransico Him MD, ABSM Weight (lbs): 261 RPSGT: Laren Everts BMI: 40 MRN: [id number] Neck Size: 14.00  CLINICAL INFORMATION The patient is referred for a CPAP titration to treat sleep apnea.  SLEEP STUDY TECHNIQUE As per the AASM Manual for the Scoring of Sleep and Associated Events v2.3 (April 2016) with a hypopnea requiring 4% desaturations.  The channels recorded and monitored were frontal, central and occipital EEG, electrooculogram (EOG), submentalis EMG (chin), nasal and oral airflow, thoracic and abdominal wall motion, anterior tibialis EMG, snore microphone, electrocardiogram, and pulse oximetry. Continuous positive airway pressure (CPAP) was initiated at the beginning of the study and titrated to treat sleep-disordered breathing.  MEDICATIONS Medications self-administered by patient taken the night of the study : [facility] Sleep Aid, XANAX, GABAPENTIN, FISH OIL, METHOCARBAMOL  TECHNICIAN COMMENTS Comments added by technician: None Comments added by scorer: N/A RESPIRATORY PARAMETERS Optimal PAP Pressure (cm):12  AHI at Optimal Pressure (/hr):0.4 Overall Minimal O2 (%):85.0  Supine % at Optimal Pressure (%):0 Minimal O2 at Optimal Pressure (%): 92.0   SLEEP ARCHITECTURE The study was initiated at 10:11:02 PM and ended at 5:31:35 AM.  Sleep onset time was 15.6 minutes and the sleep efficiency was 89.5%. The total sleep time was 394.5 minutes.  The patient spent 7.2% of the night in stage N1 sleep, 67.3% in stage N2 sleep, 0.0% in stage N3 and 25.5% in REM.Stage REM latency was 213.0 minutes  Wake after sleep onset was 30.4. Alpha intrusion was absent. Supine sleep was 14.20%.  CARDIAC DATA The 2 lead EKG demonstrated sinus rhythm. The mean heart rate was 50.5 beats per  minute. Other EKG findings include: None.  LEG MOVEMENT DATA The total Periodic Limb Movements of Sleep (PLMS) were 0. The PLMS index was 0.0. A PLMS index of <15 is considered normal in adults.  IMPRESSIONS - The optimal PAP pressure was 12 cm of water. - Central sleep apnea was not noted during this titration (CAI = 0.2/h). - Moderate oxygen desaturations were observed during this titration (min O2 = 85.0%). - No snoring was audible during this study. - No cardiac abnormalities were observed during this study. - Clinically significant periodic limb movements were not noted during this study. Arousals associated with PLMs were rare.  DIAGNOSIS - Obstructive Sleep Apnea (G47.33) - Nocturnal Hypoxemia  RECOMMENDATIONS - Trial of CPAP therapy on 12 cm H2O with a Small-Medium size Fisher&Paykel Full Face Evora Full mask and heated humidification. - Avoid alcohol, sedatives and other CNS depressants that may worsen sleep apnea and disrupt normal sleep architecture. - Sleep hygiene should be reviewed to assess factors that may improve sleep quality. - Weight management and regular exercise should be initiated or continued. - Return to Sleep Center for re-evaluation after 6 weeks of therapy  [Electronically signed] 04/11/2022 02:31 PM  Fransico Him MD, ABSM Diplomate, American Board of Sleep Medicine

## 2022-04-20 ENCOUNTER — Telehealth: Payer: Self-pay | Admitting: *Deleted

## 2022-04-20 NOTE — Telephone Encounter (Signed)
The patient has been notified of the result. Left detailed message on voicemail and informed patient to call back.Nicole Hines, [place] 12/29/2021 12:08 PM

## 2022-04-20 NOTE — Telephone Encounter (Signed)
-----   Message from Lauralee Evener, [place] sent at 04/12/2022 10:01 AM EDT -----  ----- Message ----- From: Sueanne Margarita, MD Sent: 04/11/2022   2:33 PM EDT To: [facility] Sleep Studies  Please let patient know that they had a successful PAP titration and let DME know that orders are in EPIC.  Please set up 6 week OV with me.

## 2022-04-21 NOTE — Telephone Encounter (Signed)
Return call: Pt is aware and agreeable to normal results.  Upon patient request DME selection is [facility].. Patient understands he will be contacted by [facility] to set up his cpap. Patient understands to call if [facility] does not contact him with new setup in a timely manner. Patient understands they will be called once confirmation has been received from [facility]/ that they have received their new machine to schedule 10 week follow up appointment.   Centre notified of new cpap order  Please add to airview Patient was grateful for the call and thanked me.

## 2022-05-29 ENCOUNTER — Ambulatory Visit (INDEPENDENT_AMBULATORY_CARE_PROVIDER_SITE_OTHER): Payer: PRIVATE HEALTH INSURANCE | Admitting: Infectious Disease

## 2022-05-29 ENCOUNTER — Encounter: Payer: Self-pay | Admitting: Infectious Disease

## 2022-05-29 ENCOUNTER — Other Ambulatory Visit: Payer: Self-pay

## 2022-05-29 VITALS — BP 154/82 | HR 76 | Temp 98.0°F | Wt 263.0 lb

## 2022-05-29 DIAGNOSIS — Z22322 Carrier or suspected carrier of Methicillin resistant Staphylococcus aureus: Secondary | ICD-10-CM

## 2022-05-29 DIAGNOSIS — M8668 Other chronic osteomyelitis, other site: Secondary | ICD-10-CM

## 2022-05-29 DIAGNOSIS — I469 Cardiac arrest, cause unspecified: Secondary | ICD-10-CM

## 2022-05-29 DIAGNOSIS — B9562 Methicillin resistant Staphylococcus aureus infection as the cause of diseases classified elsewhere: Secondary | ICD-10-CM

## 2022-05-29 DIAGNOSIS — M533 Sacrococcygeal disorders, not elsewhere classified: Secondary | ICD-10-CM | POA: Diagnosis not present

## 2022-05-29 DIAGNOSIS — R7881 Bacteremia: Secondary | ICD-10-CM | POA: Insufficient documentation

## 2022-05-29 DIAGNOSIS — Z7185 Encounter for immunization safety counseling: Secondary | ICD-10-CM

## 2022-05-29 HISTORY — DX: Methicillin resistant Staphylococcus aureus infection as the cause of diseases classified elsewhere: B95.62

## 2022-05-29 NOTE — Progress Notes (Signed)
Subjective:  Chief complaint: Follow-up for metastatic MRSA infection that was in hip pain when she does particular movements or bends over and does certain things but stressing the joint  patient ID: Nicole Hines, female    DOB: 06-13-1961, 61 y.o.   MRN: [id number]  HPI  61 y.o. female with MRSA bacteremia and septic SI joint also with murmur,.  She had a 2D echocardiogram and a transesophageal echocardiogram which were both negative for endocarditis.  After her initial cultures on 18 December 2020  were positive for MRSA her repeat blood cultures on the third of May were not growing any organism and never did.  However would not reach day 5 of cultures being finalized.  On the day in question she had lost IV access and her pain was not capable of being controlled apparently without IV Dilaudid.  Therefore I recommended getting another set of blood cultures prior to placement of the PICC line that day.  Unfortunately that culture that was done turned out to be only a single culture and did itself again grow MRSA.  My partner Dr. Baxter Flattery and Dr. Linus Salmons was automatically consulted and saw the patient again contemplated PICC line removal.  Repeat blood cultures were done and were negative and PICC line was not removed.  My own approach would have been to remove the PICC line if necessary bridger with oral therapy prior to placing a new PICC line provided her pain could have been controlled.  In the interim she was found to have on CT scan new hypoattenuating area in the left iliac muscle concerning for a punctate foci of gas with 6 x 3.1 x 1.6 cm with concern for phlegmon near the left sacroiliac joint.  Interventional radiology attempted to aspirate this area but were unsuccessful.  She is then continued on daptomycin and was discharged to home with home IV antibiotics.  Hip pain  had continued to improve when I saw her last but still was having some pain in her groin that she attributed to the  attempt to aspirate the phlegmon   I ordered an MRI of her hip to reassess the area of prior phlegmonous changes.  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  Patient continued on doxycycline in the interim.  I last saw her inflammatory markers did come down.  At one of prior visits she was  telling me about pain in her neck that she says she has had since she was hospitalized particular if she tries to turn her head to the right to look behind her.  She did have more limited range of motion turning her head to the right she did  not have tenderness over sternoclavicular joint of the muscles themselves the shoulder joint range of motion is   I obtained an MRI of the C-spine with and without contrast.  This showed no evidence of discitis or epidural abscess in the cervical spine there was some multilevel facet arthropathy and edema and enhancement of the C3-C4 facet joint thought to be degenerative with septic arthritis being a much less likely possibility there is also some mild neural foraminal stenosis at C3 4 and C4-5, but no spinal stenosis.  She is continued on  doxycycline.  She continued to have low back pain and some hip pain but more so when she is working although she also feels like it is better when she is moving rather than sitting still.  She had a complicated stay recently in the hospital with a PEA arrest and prolonged resuscitation.  Since then she is being worked up closely by Dr. [name] with cardiology.  She had a sleep study done    At her previous visit she reiterated that she had fallen several times but not due to syncopal events.  Still had hip and back  pain and we ended up getting an MRI of the left hip  with and without contrast.   MRI showed some one of the SI joint some irregularity and bone marrow edema along the sacral aspect.  Radiology read this is possibly being consistent with inflammatory arthritis versus septic arthritis.  I suggested a dedicated MRI of the SI joints to be performed.  There was moderate osteoarthritis of left hip but no evidence of septic left hip arthritis.  There is also subcortical bone marrow edema in the greater tuberosity of the insertion of the gluteus medius tendon felt c/w enthesistis and mild tendinosis of the left hamstring, with a partial tear.  She then endorsed actually actually having worsening pain in the left hip with some radiation into the groin.  I obtained an MRI in August 2023 which showed:   1. 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 No definite findings to suggest recurrent infection. If that remains a clinical concern, follow-up MRI in 2-3 months could be performed.  2. The previously demonstrated surrounding soft tissue inflammatory changes and fluid collections have resolved.  3. Lower lumbar spondylosis and mild right SI joint degenerative changes.   We had her stop antibiotics and her pain has not worsened at all since she stopped on April 07, 2022.  She still has pain in certain stressor positions in particular at her work.  She has not had fevers chills or other systemic symptoms to suggest recurrence of infection either.         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 abdomen   TEE WITHOUT CARDIOVERSION N/A 12/27/2020   Procedure: TRANSESOPHAGEAL ECHOCARDIOGRAM (TEE);  Surgeon: [name], MD;  Location: [facility] ENDOSCOPY;  Service: Cardiovascular;  Laterality: N/A;    Family History  Problem Relation Age of Onset   Cancer Mother        pancreatic   Diabetes Mother    Hypertension Mother    Heart attack Father    Heart attack Maternal Grandmother    Diabetes Brother    Cancer Maternal Aunt  uterine   Diabetes Maternal Aunt    Colon cancer Neg Hx       Social History   Socioeconomic History   Marital status: Divorced    Spouse name: Not on file   Number of children: Not on file   Years of education: Not on file   Highest education level: Not on file  Occupational History   Not on file  Tobacco Use   Smoking status: Never   Smokeless tobacco: Never  Vaping Use   Vaping Use: Never used  Substance and Sexual Activity   Alcohol use: No   Drug use: No   Sexual activity: Yes    Partners: Female    Birth control/protection: Surgical    Comment: btl  Other Topics Concern   Not on file  Social History Narrative   Not on file   Social Determinants of Health   Financial Resource Strain: Not on file  Food Insecurity: Not on file  Transportation Needs: Not on file  Physical Activity: Not on file  Stress: Not on file  Social Connections: Not on file    No Known Allergies   Current Outpatient Medications:    albuterol (VENTOLIN HFA) 108 (90 Base) MCG/ACT inhaler, Inhale 2 puffs into the lungs every 4 (four) hours as needed for shortness of breath or wheezing., Disp: , Rfl:    ALPRAZolam  (XANAX) 0.5 MG tablet, Take 1 mg by mouth at bedtime. For anxiety, Disp: , Rfl:    Calcium Carbonate-Vitamin D (CALCIUM 600+D3 PO), Take 1 tablet by mouth 2 (two) times daily., Disp: , Rfl:    citalopram (CELEXA) 40 MG tablet, Take 40 mg by mouth every morning., Disp: , Rfl:    clobetasol cream (TEMOVATE) 3.29 %, Apply 1 application topically at bedtime. Around lips, Disp: , Rfl:    doxycycline (VIBRA-TABS) 100 MG tablet, TAKE 1 TABLET BY MOUTH TWICE A DAY, Disp: 60 tablet, Rfl: 1   Doxylamine Succinate, Sleep, (SLEEP AID PO), Take by mouth., Disp: , Rfl:    fish oil-omega-3 fatty acids 1000 MG capsule, Take 1 g by mouth 2 (two) times daily., Disp: , Rfl:    gabapentin (NEURONTIN) 100 MG capsule, Take 300 mg by mouth at bedtime., Disp: , Rfl:    Glucosamine-Chondroit-Vit C-Mn (GLUCOSAMINE 1500 COMPLEX PO), Take 1 capsule by mouth 2 (two) times daily., Disp: , Rfl:    methocarbamol (ROBAXIN) 750 MG tablet, Take 750 mg by mouth 3 (three) times daily as needed for muscle spasms., Disp: , Rfl:    montelukast (SINGULAIR) 10 MG tablet, Take 10 mg by mouth every morning. , Disp: , Rfl:    Multiple Vitamin (MULTIVITAMIN WITH MINERALS) TABS tablet, Take 1 tablet by mouth daily., Disp: , Rfl:    omeprazole (PRILOSEC) 20 MG capsule, Take 1 capsule (20 mg total) by mouth daily before breakfast., Disp: 30 capsule, Rfl: 0   oxyCODONE-acetaminophen (PERCOCET) 10-325 MG per tablet, Take 1 tablet by mouth every 6 (six) hours as needed for pain. , Disp: , Rfl:    polyethylene glycol (MIRALAX / GLYCOLAX) 17 g packet, Take 17 g by mouth daily as needed for severe constipation., Disp: 14 each, Rfl: 2   telmisartan (MICARDIS) 40 MG tablet, Take 40 mg by mouth daily., Disp: , Rfl:    vitamin B-12 (CYANOCOBALAMIN) 1000 MCG tablet, Take 1,000 mcg by mouth daily., Disp: , Rfl:     Review of Systems  Constitutional:  Negative for activity change, appetite change, chills, diaphoresis, fatigue, fever  and unexpected weight  change.  HENT:  Negative for congestion, rhinorrhea, sinus pressure, sneezing, sore throat and trouble swallowing.   Eyes:  Negative for photophobia and visual disturbance.  Respiratory:  Negative for cough, chest tightness, shortness of breath, wheezing and stridor.   Cardiovascular:  Negative for chest pain, palpitations and leg swelling.  Gastrointestinal:  Negative for abdominal distention, abdominal pain, anal bleeding, blood in stool, constipation, diarrhea, nausea and vomiting.  Genitourinary:  Negative for difficulty urinating, dysuria, flank pain and hematuria.  Musculoskeletal:  Positive for back pain. Negative for arthralgias, gait problem, joint swelling and myalgias.  Skin:  Negative for color change, pallor, rash and wound.  Neurological:  Negative for dizziness, tremors, weakness and light-headedness.  Hematological:  Negative for adenopathy. Does not bruise/bleed easily.  Psychiatric/Behavioral:  Negative for agitation, behavioral problems, confusion, decreased concentration, dysphoric mood and sleep disturbance.        Objective:   Physical Exam Constitutional:      General: She is not in acute distress.    Appearance: Normal appearance. She is well-developed. She is not ill-appearing or diaphoretic.  HENT:     Head: Normocephalic and atraumatic.     Right Ear: Hearing and external ear normal.     Left Ear: Hearing and external ear normal.     Nose: No nasal deformity or rhinorrhea.  Eyes:     General: No scleral icterus.    Conjunctiva/sclera: Conjunctivae normal.     Right eye: Right conjunctiva is not injected.     Left eye: Left conjunctiva is not injected.     Pupils: Pupils are equal, round, and reactive to light.  Neck:     Vascular: No JVD.  Cardiovascular:     Rate and Rhythm: Normal rate and regular rhythm.     Heart sounds: S1 normal and S2 normal.  Abdominal:     General: There is no distension.     Palpations: Abdomen is soft.  Musculoskeletal:      Right shoulder: Normal.     Left shoulder: Normal.     Cervical back: Normal range of motion and neck supple.     Right hip: Normal.     Left hip: Normal.     Right knee: Normal.     Left knee: Normal.  Lymphadenopathy:     Head:     Right side of head: No submandibular, preauricular or posterior auricular adenopathy.     Left side of head: No submandibular, preauricular or posterior auricular adenopathy.     Cervical: No cervical adenopathy.     Right cervical: No superficial or deep cervical adenopathy.    Left cervical: No superficial or deep cervical adenopathy.  Skin:    General: Skin is warm and dry.     Coloration: Skin is not pale.     Findings: No abrasion, bruising, ecchymosis, erythema, lesion or rash.     Nails: There is no clubbing.  Neurological:     Mental Status: She is alert and oriented to person, place, and time.     Sensory: No sensory deficit.     Coordination: Coordination normal.     Gait: Gait normal.  Psychiatric:        Attention and Perception: She is attentive.        Mood and Affect: Mood normal.        Speech: Speech normal.        Behavior: Behavior normal. Behavior is cooperative.  Thought Content: Thought content normal.        Judgment: Judgment normal.        Assessment & Plan:   MRSA bacteremia with septic SI joint and adjacent phlegmon and osteomyelitis seeno n MRI bone of sacrum and ilium with fasciitis and pyomyositis involving left gluteus muscle and piriformis:  Nearly every site is resolved radiographically at this point time and she has done well off of antibiotics  Recheck a CBC with differential and BMP with GFR and sed rate and CRP.  Provided these are reassuring she can return to clinic in 6 months time and if really doing well at that point time can also cancel her appointment.  She is to return if symptoms worsen    Vaccine counseling: offered flu vaccine--but she has already had this at work  I spent 31 minutes  with the patient including than 50% of the time in face to face counseling of the patient personally reviewing MRI of SI joint from 04/05/2022  along with review of medical records in preparation for the visit and during the visit and in coordination of her care.

## 2022-05-30 LAB — CBC WITH DIFFERENTIAL/PLATELET
Absolute Monocytes: 614 cells/uL (ref 200–950)
Basophils Absolute: 30 cells/uL (ref 0–200)
Basophils Relative: 0.4 %
Eosinophils Absolute: 222 cells/uL (ref 15–500)
Eosinophils Relative: 3 %
HCT: 36.1 % (ref 35.0–45.0)
Hemoglobin: 11.9 g/dL (ref 11.7–15.5)
Lymphs Abs: 2738 cells/uL (ref 850–3900)
MCH: 29.8 pg (ref 27.0–33.0)
MCHC: 33 g/dL (ref 32.0–36.0)
MCV: 90.3 fL (ref 80.0–100.0)
MPV: 10.1 fL (ref 7.5–12.5)
Monocytes Relative: 8.3 %
Neutro Abs: 3796 cells/uL (ref 1500–7800)
Neutrophils Relative %: 51.3 %
Platelets: 263 10*3/uL (ref 140–400)
RBC: 4 10*6/uL (ref 3.80–5.10)
RDW: 12.8 % (ref 11.0–15.0)
Total Lymphocyte: 37 %
WBC: 7.4 10*3/uL (ref 3.8–10.8)

## 2022-05-30 LAB — BASIC METABOLIC PANEL WITH GFR
BUN: 14 mg/dL (ref 7–25)
CO2: 30 mmol/L (ref 20–32)
Calcium: 9.9 mg/dL (ref 8.6–10.4)
Chloride: 101 mmol/L (ref 98–110)
Creat: 0.6 mg/dL (ref 0.50–1.05)
Glucose, Bld: 112 mg/dL — ABNORMAL HIGH (ref 65–99)
Potassium: 4.9 mmol/L (ref 3.5–5.3)
Sodium: 139 mmol/L (ref 135–146)
eGFR: 103 mL/min/{1.73_m2} (ref >=60)

## 2022-05-30 LAB — C-REACTIVE PROTEIN: CRP: 1.2 mg/L (ref <8.0)

## 2022-05-30 LAB — SEDIMENTATION RATE: Sed Rate: 63 mm/h — ABNORMAL HIGH (ref 0–30)

## 2022-09-06 ENCOUNTER — Encounter (HOSPITAL_COMMUNITY): Payer: Self-pay | Admitting: Cardiology

## 2022-09-18 ENCOUNTER — Telehealth (HOSPITAL_COMMUNITY): Payer: Self-pay | Admitting: Cardiology

## 2022-09-18 NOTE — Telephone Encounter (Signed)
Just an FYI. We have made several attempts to contact this patient including sending a letter to schedule or reschedule their echocardiogram. We will be removing the patient from the echo [place].  09/06/22 MAILED LETTER [facility]  08/31/22 [facility] to schedule @ 10:30/[facility]  08/30/22 [facility] to schedule-returned pt s call @ 2:02/[facility]  08/28/22 [facility] to schedule for Feb 2024 @ 2:27/[facility]      Thank you

## 2022-11-02 ENCOUNTER — Ambulatory Visit (HOSPITAL_COMMUNITY): Payer: PRIVATE HEALTH INSURANCE | Attending: Cardiology

## 2022-11-02 DIAGNOSIS — I340 Nonrheumatic mitral (valve) insufficiency: Secondary | ICD-10-CM | POA: Diagnosis present

## 2022-11-02 DIAGNOSIS — I351 Nonrheumatic aortic (valve) insufficiency: Secondary | ICD-10-CM | POA: Insufficient documentation

## 2022-11-02 DIAGNOSIS — I350 Nonrheumatic aortic (valve) stenosis: Secondary | ICD-10-CM | POA: Insufficient documentation

## 2022-11-02 LAB — ECHOCARDIOGRAM COMPLETE
AR max vel: 0.86 cm2
AV Area VTI: 0.78 cm2
AV Area mean vel: 0.85 cm2
AV Mean grad: 46 mmHg
AV Peak grad: 78.9 mmHg
Ao pk vel: 4.44 m/s
Area-P 1/2: 3.12 cm2
S' Lateral: 2.4 cm

## 2022-11-06 ENCOUNTER — Telehealth: Payer: Self-pay

## 2022-11-06 NOTE — Telephone Encounter (Signed)
-----   Message from [name], MD sent at 11/03/2022  8:18 PM EDT ----- Thank you so much Dr. Radford Pax,  Triage team, can we get her in with [place] to discuss results so we can get her scheduled to see structural?   Thank you so much! ----- Message ----- From: Sueanne Margarita, MD Sent: 11/03/2022   7:48 AM EDT To: [name], MD; [name], MD; #  Echo showed normal heart function with mildly thickened heart muscle due to hypertension and aortic stenosis.  There is increased stiffness of the heart muscle.  There is mild leakiness of the mitral valve and severe aortic valve stenosis with a mean gradient of 46 mmHg  Compared to echo 04/06/2022 aortic stenosis has progressed.  This is Dr. Jacolyn Reedy patient who I had ordered an echo for March of this year after reviewing her echo back in August when I was covering Dr. Jacolyn Reedy in basket while she was out.  Please forward this to Dr. [name] to follow-up on.  Patient likely will need referral to structural heart please make sure she is not having any symptoms.

## 2022-11-06 NOTE — Telephone Encounter (Signed)
Pt verbalized understanding of her Echo results... I will reach out to Dr Merryl Hacker and work on an appt for the pt to be seen.

## 2022-11-07 NOTE — Telephone Encounter (Signed)
:   SCHEDULE WITH DR. [name] TO DISCUSS ECHO RESULTS Received: Today Luna Fuse, LPN Darcella Cheshire,  She decided to schedule for 03/25 at 8:40am

## 2022-11-13 ENCOUNTER — Encounter: Payer: Self-pay | Admitting: Cardiology

## 2022-11-13 ENCOUNTER — Telehealth: Payer: Self-pay | Admitting: *Deleted

## 2022-11-13 ENCOUNTER — Ambulatory Visit: Payer: PRIVATE HEALTH INSURANCE | Attending: Cardiology | Admitting: Cardiology

## 2022-11-13 VITALS — BP 134/81 | HR 58 | Wt 256.8 lb

## 2022-11-13 DIAGNOSIS — R7881 Bacteremia: Secondary | ICD-10-CM

## 2022-11-13 DIAGNOSIS — B9562 Methicillin resistant Staphylococcus aureus infection as the cause of diseases classified elsewhere: Secondary | ICD-10-CM

## 2022-11-13 DIAGNOSIS — I480 Paroxysmal atrial fibrillation: Secondary | ICD-10-CM

## 2022-11-13 DIAGNOSIS — I350 Nonrheumatic aortic (valve) stenosis: Secondary | ICD-10-CM | POA: Diagnosis not present

## 2022-11-13 DIAGNOSIS — I10 Essential (primary) hypertension: Secondary | ICD-10-CM

## 2022-11-13 DIAGNOSIS — R55 Syncope and collapse: Secondary | ICD-10-CM

## 2022-11-13 DIAGNOSIS — G4733 Obstructive sleep apnea (adult) (pediatric): Secondary | ICD-10-CM

## 2022-11-13 DIAGNOSIS — Z79899 Other long term (current) drug therapy: Secondary | ICD-10-CM

## 2022-11-13 DIAGNOSIS — E785 Hyperlipidemia, unspecified: Secondary | ICD-10-CM

## 2022-11-13 MED ORDER — ROSUVASTATIN CALCIUM 10 MG PO TABS: 10.0000 mg | ORAL_TABLET | Freq: Every day | ORAL | 2 refills | Status: DC

## 2022-11-13 NOTE — Progress Notes (Signed)
Cardiology Office Note:    Date:  11/13/2022   ID:  Aafiya Crout Sparling, DOB [birth year], MRN [id number]  PCP:  [name], MD   [place] Prov[facility] Cardiologist:  [name], MD {   Referring MD: [name], MD    History of Present Illness:    Nicole Hines is a 62 y.o. female with a hx of  hx of moderate AS, HTN, morbid obesity, MRSA bacteremia c/b septic L sacroilitis, PEA arrest in 08/2020 and chronic pain who presents to clinic for follow-up.  Patient hospitalized in 12/2020 for left septic scaroiliac joint with MRSA bacteremia. TEE at that time without evidence of vegetation. Underwent drainage of the joint and received IV daptomycin. Has been followed by ID and been maintained on doxycycline.   Was re-hospitalized at [facility] in 08/2021 after being found down unresponsive by her son. CPR was started and EMS was called. Upon arrival of EMS, the patient was noted to be in PEA arrest. Epi x4 was administered before ROSC was achieved. Post-ROSC rhythm was Afib with RVR then sinus tachycardia. She was still noted to be lethargic post-ROSC so narcan was given. Reportedly when she received narcan her lethargy/confusion completely resolved. In the ER, labs notable for potassium 2.9, WBC 20.1, lactate 4, and troponin 112 -> 359. CTH with no acute intracranial process. CT angio chest negative for dissection or PE. EKG without ST segment changes. Was seen by Cardiology and deemed that arrest unlikely to be due to ACS and likely multifactorial in nature from dehydration, narcotic use, hypoxia, possibly hypotension in the setting of moderate AS. She was discharged home in stable condition. Follow-up cardiac monitor with nonsustained SVT but no evidence of recurrent Afib or sustained arrhythmias.   Underwent TTE 10/2022 for monitoring of AS which revealed EF 60-65%, G2DD, normal strain, normal RV, mild MR, severe AS with AVA 0.78cm2, mean gradient 32mmHg, Vmax 4.4.  Today, the patient  feels okay. Has been feeling more tired but otherwise no chest pain, SOB at rest, orthopnea, LE edema, or PND. Has chronic DOE but this has not been progressing that she is aware of. We discussed her echo results at length today and she is willing to see the structural team to discuss further.   Notably, she has not started CPAP. We recommended she start at this time.   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 abdomen   TEE WITHOUT CARDIOVERSION N/A 12/27/2020   Procedure: TRANSESOPHAGEAL ECHOCARDIOGRAM (TEE);  Surgeon: [name], MD;  Location: [facility] ENDOSCOPY;  Service: Cardiovascular;  Laterality: N/A;    Current Medications: Current Meds  Medication Sig   albuterol (VENTOLIN HFA) 108 (90 Base) MCG/ACT inhaler Inhale 2 puffs  into the lungs every 4 (four) hours as needed for shortness of breath or wheezing.   ALPRAZolam (XANAX) 0.5 MG tablet Take 1 mg by mouth at bedtime. For anxiety   Calcium Carbonate-Vitamin D (CALCIUM 600+D3 PO) Take 1 tablet by mouth 2 (two) times daily.   citalopram (CELEXA) 40 MG tablet Take 40 mg by mouth every morning.   clobetasol cream (TEMOVATE) [id number] % Apply 1 application topically at bedtime. Around lips   Doxylamine Succinate, Sleep, (SLEEP AID PO) Take by mouth.   fish oil-omega-3  fatty acids 1000 MG capsule Take 1 g by mouth 2 (two) times daily.   gabapentin (NEURONTIN) 100 MG capsule Take 300 mg by mouth at bedtime.   Glucosamine-Chondroit-Vit C-Mn (GLUCOSAMINE 1500 COMPLEX PO) Take 1 capsule by mouth 2 (two) times daily.   methocarbamol (ROBAXIN) 750 MG tablet Take 750 mg by mouth 3 (three) times daily as needed for muscle spasms.   montelukast (SINGULAIR) 10 MG tablet Take 10 mg by mouth every morning.    Multiple Vitamin (MULTIVITAMIN WITH MINERALS) TABS tablet Take 1 tablet by mouth daily.   omeprazole (PRILOSEC) 20 MG capsule Take 1 capsule (20 mg total) by mouth daily before breakfast.   oxyCODONE-acetaminophen (PERCOCET) 10-325 MG per tablet Take 1 tablet by mouth every 6 (six) hours as needed for pain.    polyethylene glycol (MIRALAX / GLYCOLAX) 17 g packet Take 17 g by mouth daily as needed for severe constipation.   telmisartan (MICARDIS) 40 MG tablet Take 40 mg by mouth daily.   vitamin B-12 (CYANOCOBALAMIN) 1000 MCG tablet Take 1,000 mcg by mouth daily.     Allergies:   Patient has no known allergies.   Social History   Socioeconomic History   Marital status: Divorced    Spouse name: Not on file   Number of children: Not on file   Years of education: Not on file   Highest education level: Not on file  Occupational History   Not on file  Tobacco Use   Smoking status: Never   Smokeless tobacco: Never  Vaping Use   Vaping Use: Never used  Substance and Sexual Activity   Alcohol use: No   Drug use: No   Sexual activity: Yes    Partners: Female    Birth control/protection: Surgical    Comment: btl  Other Topics Concern   Not on file  Social History Narrative   Not on file   Social Determinants of Health   Financial Resource Strain: Not on file  Food Insecurity: Not on file  Transportation Needs: Not on file  Physical Activity: Not on file  Stress: Not on file  Social Connections: Not on file     Family History: The patient's family  history includes Cancer in her maternal aunt and mother; Diabetes in her brother, maternal aunt, and mother; Heart attack in her father and maternal grandmother; Hypertension in her mother. There is no history of Colon cancer.  ROS:   Please see the history of present illness.    Review of Systems  Constitutional:  Negative for chills and fever.  Respiratory:  Negative for shortness of breath.   Cardiovascular:  Negative for chest pain, palpitations, orthopnea, claudication, leg swelling and PND.  Gastrointestinal:  Negative for blood in stool, melena, nausea and vomiting.  Genitourinary:  Negative for hematuria.  Musculoskeletal:  Positive for falls.  Neurological:  Negative for dizziness and loss of consciousness.     EKGs/Labs/Other Studies Reviewed:    The  following studies were reviewed today: TTE 12/14/22: IMPRESSIONS     1. Left ventricular ejection fraction, by estimation, is 60 to 65%. The  left ventricle has normal function. The left ventricle has no regional  wall motion abnormalities. There is mild concentric left ventricular  hypertrophy. Left ventricular diastolic  parameters are consistent with Grade II diastolic dysfunction  (pseudonormalization). Elevated left ventricular end-diastolic pressure.  The average left ventricular global longitudinal strain is -20.7 %. The  global longitudinal strain is normal.   2. Right ventricular systolic function is normal. The right ventricular  size is normal.   3. The mitral valve is degenerative. Mild mitral valve regurgitation. No  evidence of mitral stenosis. Moderate mitral annular calcification.   4. The aortic valve is calcified. Aortic valve regurgitation is not  visualized. Severe aortic valve stenosis. Aortic valve area, by VTI  measures 0.78 cm. Aortic valve mean gradient measures 46.0 mmHg. Aortic  valve Vmax measures 4.44 m/s.   5. The inferior vena cava is normal in size with greater than 50%  respiratory  variability, suggesting right atrial pressure of 3 mmHg.   Cardiac Monitor 09/20/21: Patch wear time was 14 days. Predominant rhythm was NSR with average HR 67bpm (42-169bpm) There were 50 runs of nonsustained SVT with longest lasting 11 beats Rare PACs, rare PVCs Patient triggered events correlated with NSR and occasionally with SVEs. No sustained arrhythmias or pauses.     Patch Wear Time:  14 days and 0 hours (2023-01-12T19:29:50-0500 to 2023-01-26T19:29:50-0500)   Patient had a min HR of 42 bpm, max HR of 169 bpm, and avg HR of 67 bpm. Predominant underlying rhythm was Sinus Rhythm. 50 Supraventricular Tachycardia runs occurred, the run with the fastest interval lasting 7 beats with a max rate of 169 bpm, the  longest lasting 11 beats with an avg rate of 120 bpm. Isolated SVEs were rare (<1.0%), SVE Couplets were rare (<1.0%), and SVE Triplets were rare (<1.0%). Isolated VEs were rare (<1.0%), and no VE Couplets or VE Triplets were present.   TEE 12/27/20: IMPRESSIONS   1. The aortic valve is tricuspid with moderate leaflet thickening and  calcification. There is also nodular commisural calcification which is  highly echogenic. This is most consistent with calcification with low  suspicion for valvular vegetations. There  is moderate aortic stenosis with AVA 1.1cm2, mean gradient 68mmHg, peak  gradient 57mmHg, Vmax 3.2m/s, DI 0.3. Trivial aortic regurgitation.   2. Left ventricular ejection fraction, by estimation, is 60 to 65%. The  left ventricle has normal function.   3. Right ventricular systolic function is normal. The right ventricular  size is normal.   4. No left atrial/left atrial appendage thrombus was detected.   5. The mitral valve is normal in structure. Trivial mitral valve  regurgitation. No evidence of mitral stenosis.   6. There is mild (Grade II) plaque.   7. No distinct valvular vegetations visualized on TEE.        TTE 12/22/20: IMPRESSIONS   1. Left  ventricular ejection fraction, by estimation, is 60 to 65%. The  left ventricle has normal function. The left ventricle has no regional  wall motion abnormalities. There is mild left ventricular hypertrophy.  Left ventricular diastolic parameters  were normal.   2. Right ventricular systolic function is normal. The right ventricular  size is normal.   3. Left atrial size was moderately dilated.   4. The pericardial effusion is anterior to the right ventricle.   5. The mitral valve  is normal in structure. No evidence of mitral valve  regurgitation. No evidence of mitral stenosis.   6. Probably tri leaflet . The aortic valve was not well visualized. There  is moderate calcification of the aortic valve. Aortic valve regurgitation  is not visualized. Moderate aortic valve stenosis.   7. The inferior vena cava is normal in size with greater than 50%  respiratory variability, suggesting right atrial pressure of 3 mmHg.   EKG:  No new ECG  Recent Labs: 05/29/2022: BUN 14; Creat 0.60; Hemoglobin 11.9; Platelets 263; Potassium 4.9; Sodium 139  Recent Lipid Panel    Component Value Date/Time   CHOL 174 08/26/2021 0344   TRIG 21 08/26/2021 0344   HDL 62 08/26/2021 0344   CHOLHDL 2.8 08/26/2021 0344   VLDL 4 08/26/2021 0344   LDLCALC 108 (H) 08/26/2021 0344     Risk Assessment/Calculations:           Physical Exam:    VS:  BP 134/81   Pulse (!) 58   Wt 256 lb 12.8 oz (116.5 kg)   LMP 02/18/2014   SpO2 95%   BMI 39.63 kg/m     Wt Readings from Last 3 Encounters:  11/13/22 256 lb 12.8 oz (116.5 kg)  05/29/22 263 lb (119.3 kg)  04/10/22 261 lb (118.4 kg)     GEN:  Well nourished, well developed in no acute distress HEENT: Normal NECK: No JVD; No carotid bruits CARDIAC: RRR, 3/6 harsh late peaking systolic murmur RESPIRATORY:  CTAB ABDOMEN: Soft, non-tender, non-distended MUSCULOSKELETAL:  No edema, warm SKIN: Warm and dry NEUROLOGIC:  Alert and oriented x 3 PSYCHIATRIC:   Normal affect   ASSESSMENT:    No diagnosis found.  PLAN:    In order of problems listed above:  #Severe AS TTE 10/2022 revealed EF 60-65%, G2DD, normal strain, normal RV, mild MR, severe AS with AVA 0.78cm2, mean gradient 38mmHg, Vmax 4.4.  Has mild fatigue and chronic DOE but no chest pain, HF symptoms or syncope. Will refer to structural team at this time and touch base with ID given history of MRSA bacteremia with multiple septic sites.  -Will refer to structural team for consideration for TAVR -Touch base with ID team as well   #PEA Arrest/ Syncope: Patient with reported PEA arrest in 08/2021 (vs syncopal episode) thought to be multifactorial in nature due to dehydration, narcotics, hypoxia, possible hypotension in the setting of moderate to severe AS at the time. No evidence of ACS. Currently doing well with no recurrence of symptoms.  -No recurrence of symptoms -Encouraged her to start CPAP  #Transient Atrial Fibrillation: Transient episode of Afib post-arrest with no recurrence during hospitalization or on cardiac monitor. Not on AC at this time. Denies palpitations.  -No recurrence on cardiac monitor -Not on AC at this time   #HTN: -Continue telmisartan 40mg  daily -BP well controlled with goal <130/90  #Morbid Obesity BMI 40.  -Continue lifestyle modifications  #OSA: -Encouraged her to start CPAP  #HLD: -Start crestor 10mg  daily -Check lipids in 8 weeks  #History of MRSA bacteremia and left septic SI joint: Follows with ID. Currently off doxycycline. Will need to ensure no source of infection prior to proceeding with TAVR work-up.         Medication Adjustments/Labs and Tests Ordered: Current medicines are reviewed at length with the patient today.  Concerns regarding medicines are outlined above.  No orders of the defined types were placed in this encounter.  No orders of the  defined types were placed in this encounter.   There are no Patient  Instructions on file for this visit.    Signed, [name], MD  11/13/2022 9:23 AM    [place]

## 2022-11-13 NOTE — Patient Instructions (Addendum)
Medication Instructions:   START TAKING ROSUVASTATIN (CRESTOR) 10 MG BY MOUTH DAILY  *If you need a refill on your cardiac medications before your next appointment, please call your pharmacy*   You have been referred to [facility]--OUR STRUCTURAL NURSE NAVIGATORS WILL BE REACHING OUT TO YOU SOON TO COORDINATE THIS APPOINTMENT  LABS:  IN 8 WEEKS HERE IN THE OFFICE--LIPIDS--PLEASE COME FASTING TO THIS LAB APPOINTMENT    Follow-Up: At [facility], you and your health needs are our priority.  As part of our continuing mission to provide you with exceptional heart care, we have created designated Provider Care Teams.  These Care Teams include your primary Cardiologist (physician) and Advanced Practice Providers (APPs -  Physician Assistants and Nurse Practitioners) who all work together to provide you with the care you need, when you need it.  We recommend signing up for the patient portal called "MyChart".  Sign up information is provided on this After Visit Summary.  MyChart is used to connect with patients for Virtual Visits (Telemedicine).  Patients are able to view lab/test results, encounter notes, upcoming appointments, etc.  Non-urgent messages can be sent to your provider as well.   To learn more about what you can do with MyChart, go to [email or web address].    Your next appointment:   6 month(s)  Provider:   [name], MD      Other Instructions  OUR SLEEP STUDY COORDINATOR NINA JONES WILL BE REACHING OUT TO YOU SOON TO HELP COORDINATE GETTING YOUR CPAP SUPPLIES TO YOU

## 2022-11-13 NOTE — Telephone Encounter (Signed)
-----   Message from Sueanne Margarita, MD sent at 11/13/2022 12:26 PM EDT ----- Regarding: RE: [place] PER DR. Rayford Halsted please followup with DME for her to get her BIPAP device ----- Message ----- From: Nuala Alpha, LPN Sent: [id number]   9:42 AM EDT To: Sueanne Margarita, MD; [name], CMA; # Subject: NEEDS SLEEP STUDY SUPPLIES PER DR. [name]   This pt had her sleep study done by you guys and got the results and was suggested that she get CPAP, which she never pursued  Dr. [name] advised her to get the CPAP supplies and that you will be reaching out to her soon to help coordinate this.    Gae Bon can you please assist with this?  Thanks [employer]

## 2022-11-15 ENCOUNTER — Telehealth: Payer: Self-pay | Admitting: *Deleted

## 2022-11-15 NOTE — Telephone Encounter (Signed)
[place]ork[place], Joye Dorado - 11/15/2022  9:48 AM [name], MD  Sent: Wed November 15, 2022 10:42 AM  To: [name], CMA; Nuala Alpha, LPN         Message  Thank you so much for your help!!

## 2022-11-15 NOTE — Progress Notes (Unsigned)
[facility] Consult Note  No chief complaint on file.   History of Present Illness: 62 yo female with history of anxiety, asthma, MRSA bacteremia with chronic osteomyelitis of sacrum, HTN, morbid obesity, prior PEA arrest and severe aortic stenosis who is here today as a new consult, referred by Dr. [name], for further discussion regarding her aortic stenosis and possible AVR or TAVR. She was hospitalized in May 2022 with MRSA bacteremia with sacro-iliac osteomyelitis. TEE at that time without evidence of vegetation. She underwent drainage of the joint and was treated with antibiotics. She was then readmitted to [facility] in January 2023 after being found unresponsive. She was in PEA arrest when EMS arrived. Post code rhythm was atrial fib with RVR. Her confusion post arrest resolved when she was given Narcan. Her arrest was not felt to be from a cardiac event. Cardiac monitor after discharge showed SVT. Echo March 2024 with [id number], grade 2 diastolic dysfunction. Normal RV size and function, mild MR. Severe aortic stenosis with mean gradient 46 mmHg, AVA 0.78 cm2, dimensionless index 0.25.   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 abdomen   TEE WITHOUT CARDIOVERSION N/A 12/27/2020   Procedure: TRANSESOPHAGEAL ECHOCARDIOGRAM (TEE);  Surgeon: [name], MD;  Location: [facility] ENDOSCOPY;  Service: Cardiovascular;  Laterality: N/A;    Current Outpatient Medications  Medication Sig Dispense Refill   albuterol (VENTOLIN HFA) 108 (90 Base) MCG/ACT inhaler Inhale 2 puffs into the lungs every 4 (four) hours as needed for shortness of breath or wheezing.     ALPRAZolam (XANAX) 0.5 MG tablet Take 1 mg by mouth at bedtime. For anxiety     Calcium Carbonate-Vitamin D (CALCIUM 600+D3 PO) Take 1 tablet by mouth 2 (two) times daily.     citalopram (CELEXA) 40 MG tablet Take 40 mg by mouth every morning.     clobetasol cream (TEMOVATE) [id number] % Apply 1 application topically at bedtime. Around lips     Doxylamine Succinate, Sleep, (SLEEP AID PO) Take by mouth.     fish oil-omega-3 fatty acids 1000 MG capsule Take 1 g by mouth 2 (two) times daily.     gabapentin (NEURONTIN) 100 MG capsule Take 300 mg by mouth at bedtime.     Glucosamine-Chondroit-Vit C-Mn (GLUCOSAMINE 1500 COMPLEX PO) Take 1 capsule by mouth 2 (two) times daily.     methocarbamol (ROBAXIN) 750 MG tablet Take 750 mg by mouth 3 (three) times daily as needed for muscle spasms.     montelukast (SINGULAIR) 10 MG tablet Take 10 mg  by mouth every morning.      Multiple Vitamin (MULTIVITAMIN WITH MINERALS) TABS tablet Take 1 tablet by mouth daily.     omeprazole (PRILOSEC) 20 MG capsule Take 1 capsule (20 mg total) by mouth daily before breakfast. 30 capsule 0   oxyCODONE-acetaminophen (PERCOCET) 10-325 MG per tablet Take 1 tablet by mouth every 6 (six) hours as needed for pain.      polyethylene glycol (MIRALAX / GLYCOLAX) 17 g packet Take 17 g by mouth daily as needed for severe constipation. 14 each 2   rosuvastatin (CRESTOR) 10 MG tablet Take 1 tablet (10 mg total) by mouth  daily. 90 tablet 2   telmisartan (MICARDIS) 40 MG tablet Take 40 mg by mouth daily.     vitamin B-12 (CYANOCOBALAMIN) 1000 MCG tablet Take 1,000 mcg by mouth daily.     No current facility-administered medications for this visit.    No Known Allergies  Social History   Socioeconomic History   Marital status: Divorced    Spouse name: Not on file   Number of children: Not on file   Years of education: Not on file   Highest education level: Not on file  Occupational History   Not on file  Tobacco Use   Smoking status: Never   Smokeless tobacco: Never  Vaping Use   Vaping Use: Never used  Substance and Sexual Activity   Alcohol use: No   Drug use: No   Sexual activity: Yes    Partners: Female    Birth control/protection: Surgical    Comment: btl  Other Topics Concern   Not on file  Social History Narrative   Not on file   Social Determinants of Health   Financial Resource Strain: Not on file  Food Insecurity: Not on file  Transportation Needs: Not on file  Physical Activity: Not on file  Stress: Not on file  Social Connections: Not on file  Intimate Partner Violence: Not on file    Family History  Problem Relation Age of Onset   Cancer Mother        pancreatic   Diabetes Mother    Hypertension Mother    Heart attack Father    Heart attack Maternal Grandmother    Diabetes Brother    Cancer Maternal Aunt        uterine   Diabetes Maternal Aunt    Colon cancer Neg Hx     Review of Systems:  As stated in the HPI and otherwise negative.   LMP 02/18/2014   Physical Examination: General: Well developed, well nourished, NAD  HEENT: OP clear, mucus membranes moist  SKIN: warm, dry. No rashes. Neuro: No focal deficits  Musculoskeletal: Muscle strength 5/5 all ext  Psychiatric: Mood and affect normal  Neck: No JVD, no carotid bruits, no thyromegaly, no lymphadenopathy.  Lungs:Clear bilaterally, no wheezes, rhonci, crackles Cardiovascular: Regular rate and  rhythm. *** Loud, harsh, late peaking systolic murmur.  Abdomen:Soft. Bowel sounds present. Non-tender.  Extremities: *** No lower extremity edema. Pulses are 2 + in the bilateral DP/PT.  EKG:  EKG {ACTION; IS/IS [id number] ordered today. The ekg ordered today demonstrates ***  Echo 11/02/22:  1. Left ventricular ejection fraction, by estimation, is 60 to 65%. The  left ventricle has normal function. The left ventricle has no regional  wall motion abnormalities. There is mild concentric left ventricular  hypertrophy. Left ventricular diastolic  parameters are consistent with Grade II diastolic dysfunction  (pseudonormalization). Elevated left ventricular end-diastolic pressure.  The average left ventricular global longitudinal strain is -20.7 %. The  global longitudinal strain is normal.   2. Right ventricular systolic function is normal. The right ventricular  size is normal.   3. The mitral valve is degenerative. Mild mitral valve regurgitation. No  evidence of mitral stenosis. Moderate mitral annular calcification.   4. The aortic valve is calcified. Aortic valve regurgitation is not  visualized. Severe aortic valve stenosis. Aortic valve area, by VTI  measures 0.78 cm. Aortic valve mean gradient measures 46.0 mmHg. Aortic  valve Vmax measures 4.44 m/s.   5. The inferior vena cava is normal in size with greater than 50%  respiratory variability, suggesting right atrial pressure of 3 mmHg.   FINDINGS   Left Ventricle: Left ventricular ejection fraction, by estimation, is 60  to 65%. The left ventricle has normal function. The left ventricle has no  regional wall motion abnormalities. The average left ventricular global  longitudinal strain is -20.7 %.  The global longitudinal strain is normal. The left ventricular internal  cavity size was normal in size. There is mild concentric left ventricular  hypertrophy. Left ventricular diastolic parameters are consistent with  Grade II  diastolic dysfunction  (pseudonormalization). Elevated left ventricular end-diastolic pressure.   Right Ventricle: The right ventricular size is normal. No increase in  right ventricular wall thickness. Right ventricular systolic function is  normal.   Left Atrium: Left atrial size was normal in size.   Right Atrium: Right atrial size was normal in size.   Pericardium: There is no evidence of pericardial effusion.   Mitral Valve: The mitral valve is degenerative in appearance. Moderate  mitral annular calcification. Mild mitral valve regurgitation. No evidence  of mitral valve stenosis.   Tricuspid Valve: The tricuspid valve is normal in structure. Tricuspid  valve regurgitation is not demonstrated. No evidence of tricuspid  stenosis.   Aortic Valve: The aortic valve is calcified. Aortic valve regurgitation is  not visualized. Severe aortic stenosis is present. Aortic valve mean  gradient measures 46.0 mmHg. Aortic valve peak gradient measures 78.9  mmHg. Aortic valve area, by VTI measures   0.78 cm.   Pulmonic Valve: The pulmonic valve was normal in structure. Pulmonic valve  regurgitation is not visualized. No evidence of pulmonic stenosis.   Aorta: The aortic root is normal in size and structure.   Venous: The inferior vena cava is normal in size with greater than 50%  respiratory variability, suggesting right atrial pressure of 3 mmHg.   IAS/Shunts: No atrial level shunt detected by color flow Doppler.     LEFT VENTRICLE  PLAX 2D  LVIDd:         4.10 cm   Diastology  LVIDs:         2.40 cm   LV e' medial:    5.22 cm/s  LV PW:         1.20 cm   LV E/e' medial:  20.3  LV IVS:        1.10 cm   LV e' lateral:   7.72 cm/s  LVOT diam:     2.00 cm   LV E/e' lateral: 13.7  LV SV:         91  LV SV Index:   40        2D Longitudinal Strain  LVOT Area:     3.14 cm  2D Strain GLS (A2C):   -20.4 %  2D Strain GLS (A3C):   -21.2 %                            2D Strain GLS (A4C):   -20.5 %                           2D Strain GLS Avg:     -20.7 %                             3D Volume EF:                           3D EF:        64 %                           LV EDV:       184 ml                           LV ESV:       66 ml                           LV SV:        118 ml   RIGHT VENTRICLE  RV Basal diam:  3.70 cm  RV Mid diam:    3.60 cm  RV S prime:     10.10 cm/s  TAPSE (M-mode): 3.6 cm   LEFT ATRIUM             Index        RIGHT ATRIUM           Index  LA diam:        4.80 cm 2.10 cm/m   RA Area:     16.20 cm  LA Vol (A2C):   62.6 ml 27.40 ml/m  RA Volume:   41.60 ml  18.21 ml/m  LA Vol (A4C):   56.1 ml 24.56 ml/m  LA Biplane Vol: 59.7 ml 26.13 ml/m   AORTIC VALVE  AV Area (Vmax):    0.86 cm  AV Area (Vmean):   0.85 cm  AV Area (VTI):     0.78 cm  AV Vmax:           444.00 cm/s  AV Vmean:          300.000 cm/s  AV VTI:            1.170 m  AV Peak Grad:      78.9 mmHg  AV Mean Grad:      46.0 mmHg  LVOT Vmax:         122.00 cm/s  LVOT Vmean:        81.500 cm/s  LVOT VTI:          0.291 m  LVOT/AV VTI ratio: 0.25    AORTA  Ao Root diam: 3.00 cm  Ao Asc diam:  3.40 cm   MITRAL VALVE  MV Area (PHT): 3.12 cm     SHUNTS  MV Decel Time: 243 msec     Systemic VTI:  0.29 m  MV E velocity: 106.00 cm/s  Systemic Diam: 2.00 cm  MV A velocity: 91.20 cm/s  MV E/A ratio:  1.16   Recent Labs: 05/29/2022: BUN 14; Creat 0.60; Hemoglobin 11.9; Platelets 263; Potassium 4.9; Sodium 139   Lipid Panel    Component Value Date/Time   CHOL 174 08/26/2021 0344   TRIG 21 08/26/2021 0344   HDL 62 08/26/2021 0344   CHOLHDL 2.8 08/26/2021 0344   VLDL 4 08/26/2021 0344   LDLCALC 108 (H) 08/26/2021 0344     Wt Readings from Last 3 Encounters:  11/13/22 116.5 kg  05/29/22 119.3 kg  04/10/22 118.4 kg    Assessment and Plan:   1. Severe Aortic Valve Stenosis: She has severe, stage C1 aortic valve stenosis. NYHA class ***  symptoms. I have personally reviewed the echo images. The aortic valve is thickened, calcified with limited leaflet mobility. I think she would benefit from AVR. She appears to be a candidate for surgical AVR or TAVR.    I have reviewed the natural history of aortic stenosis with the patient and their family members  who are present today. We have discussed the limitations of medical therapy and the poor prognosis associated with symptomatic aortic stenosis. We have reviewed potential treatment options, including palliative medical therapy, conventional surgical aortic valve replacement, and transcatheter aortic valve replacement. We discussed treatment options in the context of the patient's specific comorbid medical conditions.   She would like to proceed with planning for TAVR. I will arrange a right and left heart catheterization at [facility] ***. Risks and benefits of the cath procedure and the valve procedure are reviewed with the patient. After the cath, she will have a cardiac CT, CTA of the chest/abdomen and pelvis and will then be referred to see one of the CT surgeons on our TAVR team.     Labs/ tests ordered today include: No orders of the defined types were placed in this encounter.  Disposition:   F/U will be planned with our structural team   Signed, Lauree Chandler, MD, [facility] 11/15/2022 5:52 PM    [facility] [place], [place], [place]  [address] Phone: [phone]; Fax: [phone]

## 2022-11-15 NOTE — Telephone Encounter (Signed)
-----   Message from [name], [place] sent at 11/15/2022  9:56 AM EDT ----- Regarding: RE: [place] PER DR. [place] will check to see if everything will still pass and call the patient. ----- Message ----- From: Nuala Alpha, LPN Sent: [id number]   9:42 AM EDT To: Sueanne Margarita, MD; [name], CMA; # Subject: NEEDS SLEEP STUDY SUPPLIES PER DR. [name]   This pt had her sleep study done by you guys and got the results and was suggested that she get CPAP, which she never pursued  Dr. [name] advised her to get the CPAP supplies and that you will be reaching out to her soon to help coordinate this.    Gae Bon can you please assist with this?  Thanks [employer]

## 2022-11-15 NOTE — Telephone Encounter (Signed)
Reached out to patient and informed her that [facility] is going to try to get her set up with her cpap asap but they need for her to answer their call or call them back asap. They report they had a difficult time reaching her before and when they finally got her on the phone she no showed her appointment. Patient states she will be looking to hear from them.

## 2022-11-16 ENCOUNTER — Ambulatory Visit: Payer: PRIVATE HEALTH INSURANCE | Attending: Cardiovascular Disease | Admitting: Cardiovascular Disease

## 2022-11-16 ENCOUNTER — Encounter: Payer: Self-pay | Admitting: Cardiovascular Disease

## 2022-11-16 VITALS — BP 124/76 | HR 60 | Ht 65.0 in | Wt 259.2 lb

## 2022-11-16 DIAGNOSIS — I350 Nonrheumatic aortic (valve) stenosis: Secondary | ICD-10-CM

## 2022-11-16 NOTE — Patient Instructions (Signed)
Medication Instructions:  No changes *If you need a refill on your cardiac medications before your next appointment, please call your pharmacy*   Lab Work: none If you have labs (blood work) drawn today and your tests are completely normal, you will receive your results only by: [facility] (if you have MyChart) OR A paper copy in the mail If you have any lab test that is abnormal or we need to change your treatment, we will call you to review the results.   Testing/Procedures:   (echo due in October 2024) Your physician has requested that you have an echocardiogram. Echocardiography is a painless test that uses sound waves to create images of your heart. It provides your doctor with information about the size and shape of your heart and how well your heart's chambers and valves are working. This procedure takes approximately one hour. There are no restrictions for this procedure. Please do NOT wear cologne, perfume, aftershave, or lotions (deodorant is allowed). Please arrive 15 minutes prior to your appointment time.   Follow-Up: At [facility], you and your health needs are our priority.  As part of our continuing mission to provide you with exceptional heart care, we have created designated Provider Care Teams.  These Care Teams include your primary Cardiologist (physician) and Advanced Practice Providers (APPs -  Physician Assistants and Nurse Practitioners) who all work together to provide you with the care you need, when you need it.  We recommend signing up for the patient portal called "MyChart".  Sign up information is provided on this After Visit Summary.  MyChart is used to connect with patients for Virtual Visits (Telemedicine).  Patients are able to view lab/test results, encounter notes, upcoming appointments, etc.  Non-urgent messages can be sent to your provider as well.   To learn more about what you can do with MyChart, go to [email or web address].    Your  next appointment:   6 month(s) (after next echo)  Provider:   Lauree Chandler, MD

## 2022-11-28 ENCOUNTER — Other Ambulatory Visit: Payer: Self-pay

## 2022-11-28 ENCOUNTER — Encounter: Payer: Self-pay | Admitting: Infectious Disease

## 2022-11-28 ENCOUNTER — Ambulatory Visit: Payer: PRIVATE HEALTH INSURANCE | Admitting: Infectious Disease

## 2022-11-28 VITALS — BP 131/81 | HR 69 | Temp 97.6°F | Wt 255.0 lb

## 2022-11-28 DIAGNOSIS — L089 Local infection of the skin and subcutaneous tissue, unspecified: Secondary | ICD-10-CM

## 2022-11-28 DIAGNOSIS — Z22322 Carrier or suspected carrier of Methicillin resistant Staphylococcus aureus: Secondary | ICD-10-CM

## 2022-11-28 DIAGNOSIS — R7881 Bacteremia: Secondary | ICD-10-CM

## 2022-11-28 DIAGNOSIS — I330 Acute and subacute infective endocarditis: Secondary | ICD-10-CM | POA: Diagnosis not present

## 2022-11-28 DIAGNOSIS — L02511 Cutaneous abscess of right hand: Secondary | ICD-10-CM | POA: Diagnosis not present

## 2022-11-28 DIAGNOSIS — I350 Nonrheumatic aortic (valve) stenosis: Secondary | ICD-10-CM

## 2022-11-28 DIAGNOSIS — M533 Sacrococcygeal disorders, not elsewhere classified: Secondary | ICD-10-CM

## 2022-11-28 DIAGNOSIS — B9562 Methicillin resistant Staphylococcus aureus infection as the cause of diseases classified elsewhere: Secondary | ICD-10-CM

## 2022-11-28 DIAGNOSIS — M4628 Osteomyelitis of vertebra, sacral and sacrococcygeal region: Secondary | ICD-10-CM

## 2022-11-28 DIAGNOSIS — G8929 Other chronic pain: Secondary | ICD-10-CM

## 2022-11-28 HISTORY — DX: Local infection of the skin and subcutaneous tissue, unspecified: L08.9

## 2022-11-28 NOTE — Progress Notes (Unsigned)
Subjective:  Chief complaint: Follow-up for metastatic MRSA infection that was in hip pain when she does particular movements or bends over and does certain things but stressing the joint  patient ID: Nicole Hines, female    DOB: Dec 18, 1960, 62 y.o.   MRN: [id number]  HPI  62 y.o. female with MRSA bacteremia and septic SI joint also with murmur,.  She had a 2D echocardiogram and a transesophageal echocardiogram which were both negative for endocarditis.  After her initial cultures on 18 December 2020  were positive for MRSA her repeat blood cultures on the third of May were not growing any organism and never did.  However would not reach day 5 of cultures being finalized.  On the day in question she had lost IV access and her pain was not capable of being controlled apparently without IV Dilaudid.  Therefore I recommended getting another set of blood cultures prior to placement of the PICC line that day.  Unfortunately that culture that was done turned out to be only a single culture and did itself again grow MRSA.  My partner Dr. Drue Second and Dr. Luciana Axe was automatically consulted and saw the patient again contemplated PICC line removal.  Repeat blood cultures were done and were negative and PICC line was not removed.  My own approach would have been to remove the PICC line if necessary bridger with oral therapy prior to placing a new PICC line provided her pain could have been controlled.  In the interim she was found to have on CT scan new hypoattenuating area in the left iliac muscle concerning for a punctate foci of gas with 6 x 3.1 x 1.6 cm with concern for phlegmon near the left sacroiliac joint.  Interventional radiology attempted to aspirate this area but were unsuccessful.  She is then continued on daptomycin and was discharged to home with home IV antibiotics.  Hip pain  had continued to improve when I saw her last but still was having some pain in her groin that she attributed to the  attempt to aspirate the phlegmon   I ordered an MRI of her hip to reassess the area of prior phlegmonous changes.  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  Patient continued on doxycycline in the interim.  I last saw her inflammatory markers did come down.  At one of prior visits she was  telling me about pain in her neck that she says she has had since she was hospitalized particular if she tries to turn her head to the right to look behind her.  She did have more limited range of motion turning her head to the right she did  not have tenderness over sternoclavicular joint of the muscles themselves the shoulder joint range of motion is   I obtained an MRI of the C-spine with and without contrast.  This showed no evidence of discitis or epidural abscess in the cervical spine there was some multilevel facet arthropathy and edema and enhancement of the C3-C4 facet joint thought to be degenerative with septic arthritis being a much less likely possibility there is also some mild neural foraminal stenosis at C3 4 and C4-5, but no spinal stenosis.  She is continued on  doxycycline.  She continued to have low back pain and some hip pain but more so when she is working although she also feels like it is better when she is moving rather than sitting still.  She had a complicated stay recently in the hospital with a PEA arrest and prolonged resuscitation.  Since then she is being worked up closely by Dr. Shari Prows with cardiology.  She had a sleep study done    At her previous visit she reiterated that she had fallen several times but not due to syncopal events.  Still had hip and back  pain and we ended up getting an MRI of the left hip  with and without contrast.   MRI showed some one of the SI joint some irregularity and bone marrow edema along the sacral aspect.  Radiology read this is possibly being consistent with inflammatory arthritis versus septic arthritis.  I suggested a dedicated MRI of the SI joints to be performed.  There was moderate osteoarthritis of left hip but no evidence of septic left hip arthritis.  There is also subcortical bone marrow edema in the greater tuberosity of the insertion of the gluteus medius tendon felt c/w enthesistis and mild tendinosis of the left hamstring, with a partial tear.  She then endorsed actually actually having worsening pain in the left hip with some radiation into the groin.  I obtained an MRI in August 2023 which showed:   1. 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 No definite findings to suggest recurrent infection. If that remains a clinical concern, follow-up MRI in 2-3 months could be performed.  2. The previously demonstrated surrounding soft tissue inflammatory changes and fluid collections have resolved.  3. Lower lumbar spondylosis and mild right SI joint degenerative changes.   We had her stop antibiotics and her pain has not worsened at all since she stopped on April 07, 2022.  She still has pain in certain stressor positions in particular at her work.  She has not had fevers chills or other systemic symptoms to suggest recurrence of infection either.         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 abdomen   TEE WITHOUT CARDIOVERSION N/A 12/27/2020   Procedure: TRANSESOPHAGEAL ECHOCARDIOGRAM (TEE);  Surgeon: Meriam Sprague, MD;  Location: [facility] ENDOSCOPY;  Service: Cardiovascular;  Laterality: N/A;    Family History  Problem Relation Age of Onset   Cancer Mother        pancreatic   Diabetes Mother    Hypertension Mother    Heart attack Father 48   Diabetes Brother    Heart attack Maternal Grandmother  Cancer Maternal Aunt        uterine   Diabetes Maternal Aunt    Colon cancer Neg Hx       Social History   Socioeconomic History   Marital status: Divorced    Spouse name: Not on file   Number of children: 2   Years of education: Not on file   Highest education level: Not on file  Occupational History   Occupation: She works at [employer] in [employer]  Tobacco Use   Smoking status: Never   Smokeless tobacco: Never  Vaping Use   Vaping Use: Never used  Substance and Sexual Activity   Alcohol use: No   Drug use: No   Sexual activity: Yes    Partners: Female    Birth control/protection: Surgical    Comment: btl  Other Topics Concern   Not on file  Social History Narrative   Not on file   Social Determinants of Health   Financial Resource Strain: Not on file  Food Insecurity: Not on file  Transportation Needs: Not on file  Physical Activity: Not on file  Stress: Not on file  Social Connections: Not on file    No Known Allergies   Current Outpatient Medications:    doxycycline (VIBRA-TABS) 100 MG tablet, Take 100 mg by mouth 2 (two) times daily., Disp: , Rfl:    albuterol  (VENTOLIN HFA) 108 (90 Base) MCG/ACT inhaler, Inhale 2 puffs into the lungs every 4 (four) hours as needed for shortness of breath or wheezing., Disp: , Rfl:    ALPRAZolam (XANAX) 0.5 MG tablet, Take 1 mg by mouth at bedtime. For anxiety, Disp: , Rfl:    Calcium Carbonate-Vitamin D (CALCIUM 600+D3 PO), Take 1 tablet by mouth daily., Disp: , Rfl:    citalopram (CELEXA) 40 MG tablet, Take 40 mg by mouth every morning., Disp: , Rfl:    clobetasol cream (TEMOVATE) 0.05 %, Apply 1 application  topically as needed. Around lips, Disp: , Rfl:    Doxylamine Succinate, Sleep, (SLEEP AID PO), Take by mouth., Disp: , Rfl:    fish oil-omega-3 fatty acids 1000 MG capsule, Take 1 g by mouth 2 (two) times daily., Disp: , Rfl:    gabapentin (NEURONTIN) 100 MG capsule, Take 300 mg by mouth at bedtime., Disp: , Rfl:    Glucosamine-Chondroit-Vit C-Mn (GLUCOSAMINE 1500 COMPLEX PO), Take 1 capsule by mouth 2 (two) times daily., Disp: , Rfl:    methocarbamol (ROBAXIN) 750 MG tablet, Take 750 mg by mouth as needed for muscle spasms., Disp: , Rfl:    montelukast (SINGULAIR) 10 MG tablet, Take 10 mg by mouth every morning. , Disp: , Rfl:    Multiple Vitamin (MULTIVITAMIN WITH MINERALS) TABS tablet, Take 1 tablet by mouth daily., Disp: , Rfl:    omeprazole (PRILOSEC) 20 MG capsule, Take 1 capsule (20 mg total) by mouth daily before breakfast., Disp: 30 capsule, Rfl: 0   oxyCODONE-acetaminophen (PERCOCET) 10-325 MG per tablet, Take 1 tablet by mouth every 6 (six) hours as needed for pain. , Disp: , Rfl:    polyethylene glycol (MIRALAX / GLYCOLAX) 17 g packet, Take 17 g by mouth daily as needed for severe constipation., Disp: 14 each, Rfl: 2   rosuvastatin (CRESTOR) 10 MG tablet, Take 1 tablet (10 mg total) by mouth daily., Disp: 90 tablet, Rfl: 2   telmisartan (MICARDIS) 40 MG tablet, Take 40 mg by mouth daily., Disp: , Rfl:    vitamin B-12 (CYANOCOBALAMIN) 1000 MCG  tablet, Take 1,000 mcg by mouth daily., Disp: , Rfl:      Review of Systems  Constitutional:  Negative for activity change, appetite change, chills, diaphoresis, fatigue, fever and unexpected weight change.  HENT:  Negative for congestion, rhinorrhea, sinus pressure, sneezing, sore throat and trouble swallowing.   Eyes:  Negative for photophobia and visual disturbance.  Respiratory:  Negative for cough, chest tightness, shortness of breath, wheezing and stridor.   Cardiovascular:  Negative for chest pain, palpitations and leg swelling.  Gastrointestinal:  Negative for abdominal distention, abdominal pain, anal bleeding, blood in stool, constipation, diarrhea, nausea and vomiting.  Genitourinary:  Negative for difficulty urinating, dysuria, flank pain and hematuria.  Musculoskeletal:  Positive for back pain. Negative for arthralgias, gait problem, joint swelling and myalgias.  Skin:  Negative for color change, pallor, rash and wound.  Neurological:  Negative for dizziness, tremors, weakness and light-headedness.  Hematological:  Negative for adenopathy. Does not bruise/bleed easily.  Psychiatric/Behavioral:  Negative for agitation, behavioral problems, confusion, decreased concentration, dysphoric mood and sleep disturbance.        Objective:   Physical Exam Constitutional:      General: She is not in acute distress.    Appearance: Normal appearance. She is well-developed. She is not ill-appearing or diaphoretic.  HENT:     Head: Normocephalic and atraumatic.     Right Ear: Hearing and external ear normal.     Left Ear: Hearing and external ear normal.     Nose: No nasal deformity or rhinorrhea.  Eyes:     General: No scleral icterus.    Conjunctiva/sclera: Conjunctivae normal.     Right eye: Right conjunctiva is not injected.     Left eye: Left conjunctiva is not injected.     Pupils: Pupils are equal, round, and reactive to light.  Neck:     Vascular: No JVD.  Cardiovascular:     Rate and Rhythm: Normal rate and regular rhythm.      Heart sounds: S1 normal and S2 normal.  Abdominal:     General: There is no distension.     Palpations: Abdomen is soft.  Musculoskeletal:     Right shoulder: Normal.     Left shoulder: Normal.     Cervical back: Normal range of motion and neck supple.     Right hip: Normal.     Left hip: Normal.     Right knee: Normal.     Left knee: Normal.  Lymphadenopathy:     Head:     Right side of head: No submandibular, preauricular or posterior auricular adenopathy.     Left side of head: No submandibular, preauricular or posterior auricular adenopathy.     Cervical: No cervical adenopathy.     Right cervical: No superficial or deep cervical adenopathy.    Left cervical: No superficial or deep cervical adenopathy.  Skin:    General: Skin is warm and dry.     Coloration: Skin is not pale.     Findings: No abrasion, bruising, ecchymosis, erythema, lesion or rash.     Nails: There is no clubbing.  Neurological:     Mental Status: She is alert and oriented to person, place, and time.     Sensory: No sensory deficit.     Coordination: Coordination normal.     Gait: Gait normal.  Psychiatric:        Attention and Perception: She is attentive.        Mood and Affect: Mood  normal.        Speech: Speech normal.        Behavior: Behavior normal. Behavior is cooperative.        Thought Content: Thought content normal.        Judgment: Judgment normal.        Assessment & Plan:   MRSA bacteremia with septic SI joint and adjacent phlegmon and osteomyelitis seeno n MRI bone of sacrum and ilium with fasciitis and pyomyositis involving left gluteus muscle and piriformis:  Nearly every site is resolved radiographically at this point time and she has done well off of antibiotics  Recheck a CBC with differential and BMP with GFR and sed rate and CRP.  Provided these are reassuring she can return to clinic in 6 months time and if really doing well at that point time can also cancel her  appointment.  She is to return if symptoms worsen    Vaccine counseling: offered flu vaccine--but she has already had this at work  I spent 31 minutes with the patient including than 50% of the time in face to face counseling of the patient personally reviewing MRI of SI joint from 04/05/2022  along with review of medical records in preparation for the visit and during the visit and in coordination of her care.

## 2022-11-29 ENCOUNTER — Telehealth: Payer: Self-pay | Admitting: Infectious Disease

## 2022-11-29 NOTE — Telephone Encounter (Signed)
Can we obtain culture data from [facility] orthopedics re this pt so I can review. Likely have it photographec and uploaded to [employer]

## 2022-12-01 NOTE — Telephone Encounter (Signed)
Called [facility] and requested Aerobic Cultures be faxed to office at [phone]. Staff is will call office back if cultures are not back yet.  Juanita Laster, RMA

## 2023-01-13 ENCOUNTER — Other Ambulatory Visit: Payer: Self-pay | Admitting: Infectious Disease

## 2023-03-07 ENCOUNTER — Telehealth: Payer: Self-pay | Admitting: *Deleted

## 2023-03-07 NOTE — Telephone Encounter (Signed)
Left detailed message on voicemail and informed patient to call back.   From [facility]: New, Madison Hickman, CMA; Henderson Newcomer; New, [place],  11-15-22 attempt : Patient was not setup, said they could not afford. Tried other pap unit and patient never answered any of our call attempts and never called back. order was voided. 01-01-23.    We also tried on 04-26-22 and patient never returned calls or answered the phone, order was voided on 07/21/22.

## 2023-03-07 NOTE — Telephone Encounter (Signed)
-----   Message from Nurse Alcario Drought E sent at 03/02/2023  1:34 PM EDT ----- Regarding: RE: NEEDS SLEEP STUDY SUPPLIES PER DR. Shari Prows Checking on whether this lady has her  machine yet? Alcario Drought ----- Message ----- From: Quintella Reichert, MD Sent: 11/13/2022  12:26 PM EDT To: Loa Socks, LPN; Reesa Chew, CMA; # Subject: RE: NEEDS SLEEP STUDY SUPPLIES PER DR. Lanae Boast please followup with DME for her to get her BIPAP device ----- Message ----- From: Loa Socks, LPN Sent: 4/69/6295   9:42 AM EDT To: Quintella Reichert, MD; Reesa Chew, CMA; # Subject: NEEDS SLEEP STUDY SUPPLIES PER DR. Shari Prows   This pt had her sleep study done by you guys and got the results and was suggested that she get CPAP, which she never pursued  Dr. Shari Prows advised her to get the CPAP supplies and that you will be reaching out to her soon to help coordinate this.    Coralee North can you please assist with this?  Thanks [employer]

## 2023-03-16 ENCOUNTER — Telehealth: Payer: Self-pay

## 2023-03-16 NOTE — Telephone Encounter (Signed)
Called patient to schedule appt w/ Dr. Mayford Knife, no answer. Left message with no descriptors asking recipient to call our office.

## 2023-03-16 NOTE — Telephone Encounter (Signed)
-----   Message from Nurse Corky Crafts sent at 03/14/2023  8:33 AM EDT ----- Regarding: FW: NEEDS SLEEP STUDY SUPPLIES PER DR. Shari Prows She would be a perfect fit to have Dr. Mayford Knife manage all her cardiac needs.  I think one person driving the ship will make her more compliant.  Thanks so much for asking and great idea.  Thanks Lajoyce Corners ----- Message ----- From: Luellen Pucker, RN Sent: 03/14/2023   8:20 AM EDT To: Loa Socks, LPN Subject: FW: NEEDS SLEEP STUDY SUPPLIES PER DR. PEMBE#  Hi Ivy, Just closing the loop of communication here. I've been trying to follow up on this for awhile. Looks like patient refused equipment due to cost.   She has a f/u appt w/ Lorin Picket 05/22/23 but is she one that needs to transfer to [facility] ? Alcario Drought, RN ----- Message ----- From: Reesa Chew, CMA Sent: 03/07/2023   2:02 PM EDT To: Luellen Pucker, RN Subject: RE: NEEDS SLEEP STUDY SUPPLIES PER DR. PEMBE#  New, Madison Hickman, CMA; Henderson Newcomer; New, [place],  11-15-22 attempt : Patient was not setup, said they could not afford. Tried other pap unit and patient never answered any of our call attempts and never called back. order was voided. 01-01-23.    We also tried on 04-26-22 and patient never returned calls or answered the phone, order was voided on 07/21/22. ----- Message ----- From: Luellen Pucker, RN Sent: 03/02/2023   1:34 PM EDT To: Reesa Chew, CMA Subject: RE: NEEDS SLEEP STUDY SUPPLIES PER DR. PEMBE#  Checking on whether this lady has her  machine yet? Alcario Drought ----- Message ----- From: Quintella Reichert, MD Sent: 11/13/2022  12:26 PM EDT To: Loa Socks, LPN; Reesa Chew, CMA; # Subject: RE: NEEDS SLEEP STUDY SUPPLIES PER DR. Lanae Boast please followup with DME for her to get her BIPAP device ----- Message ----- From: Loa Socks, LPN Sent: 7/42/5956   9:42 AM EDT To: Quintella Reichert, MD; Reesa Chew, CMA; # Subject: NEEDS SLEEP STUDY SUPPLIES PER DR.  Shari Prows   This pt had her sleep study done by you guys and got the results and was suggested that she get CPAP, which she never pursued  Dr. Shari Prows advised her to get the CPAP supplies and that you will be reaching out to her soon to help coordinate this.    Coralee North can you please assist with this?  Thanks [employer]

## 2023-03-19 ENCOUNTER — Telehealth: Payer: Self-pay

## 2023-03-19 NOTE — Telephone Encounter (Signed)
Called to offer patient a visit with Dr. Mayford Knife, no answer. Left message with no identifiers asking patient to call our office.

## 2023-03-19 NOTE — Telephone Encounter (Signed)
-----   Message from Nurse Corky Crafts sent at 03/14/2023  8:33 AM EDT ----- Regarding: FW: NEEDS SLEEP STUDY SUPPLIES PER DR. Shari Prows She would be a perfect fit to have Dr. Mayford Knife manage all her cardiac needs.  I think one person driving the ship will make her more compliant.  Thanks so much for asking and great idea.  Thanks Lajoyce Corners ----- Message ----- From: Luellen Pucker, RN Sent: 03/14/2023   8:20 AM EDT To: Loa Socks, LPN Subject: FW: NEEDS SLEEP STUDY SUPPLIES PER DR. PEMBE#  Hi Ivy, Just closing the loop of communication here. I've been trying to follow up on this for awhile. Looks like patient refused equipment due to cost.   She has a f/u appt w/ Lorin Picket 05/22/23 but is she one that needs to transfer to [facility] ? Alcario Drought, RN ----- Message ----- From: Reesa Chew, CMA Sent: 03/07/2023   2:02 PM EDT To: Luellen Pucker, RN Subject: RE: NEEDS SLEEP STUDY SUPPLIES PER DR. PEMBE#  New, Madison Hickman, CMA; Henderson Newcomer; New, [place],  11-15-22 attempt : Patient was not setup, said they could not afford. Tried other pap unit and patient never answered any of our call attempts and never called back. order was voided. 01-01-23.    We also tried on 04-26-22 and patient never returned calls or answered the phone, order was voided on 07/21/22. ----- Message ----- From: Luellen Pucker, RN Sent: 03/02/2023   1:34 PM EDT To: Reesa Chew, CMA Subject: RE: NEEDS SLEEP STUDY SUPPLIES PER DR. PEMBE#  Checking on whether this lady has her  machine yet? Alcario Drought ----- Message ----- From: Quintella Reichert, MD Sent: 11/13/2022  12:26 PM EDT To: Loa Socks, LPN; Reesa Chew, CMA; # Subject: RE: NEEDS SLEEP STUDY SUPPLIES PER DR. Lanae Boast please followup with DME for her to get her BIPAP device ----- Message ----- From: Loa Socks, LPN Sent: 7/42/5956   9:42 AM EDT To: Quintella Reichert, MD; Reesa Chew, CMA; # Subject: NEEDS SLEEP STUDY SUPPLIES PER DR.  Shari Prows   This pt had her sleep study done by you guys and got the results and was suggested that she get CPAP, which she never pursued  Dr. Shari Prows advised her to get the CPAP supplies and that you will be reaching out to her soon to help coordinate this.    Coralee North can you please assist with this?  Thanks [employer]

## 2023-04-09 ENCOUNTER — Telehealth: Payer: Self-pay | Admitting: *Deleted

## 2023-04-09 ENCOUNTER — Telehealth: Payer: Self-pay

## 2023-04-09 NOTE — Telephone Encounter (Signed)
Called patient twice in July and once today to try and schedule her with Dr. Mayford Knife, no answer. Left detailed message per DPR asking her to call our office to schedule. Placed 6 month follow recall. Also sent letter explaining Dr. Shari Prows has left the practice but patient can be seen by Dr. Mayford Knife for cards/sleep if she wishes.

## 2023-04-09 NOTE — Telephone Encounter (Signed)
-----   Message from Nurse Alcario Drought E sent at 04/09/2023  9:05 AM EDT ----- Regarding: RE: NEEDS SLEEP STUDY SUPPLIES PER DR. Encarnacion Chu, Just an FYI, called this lady 3 times. 2x in July and once today. No answer so I booked a 6 mo reminder letter for her to be seen by [facility]. She currently has a visit with Tereso Newcomer in October. I will send a letter now as well. ERica ----- Message ----- From: Loa Socks, LPN Sent: 1/61/0960   8:34 AM EDT To: Luellen Pucker, RN Subject: FW: NEEDS SLEEP STUDY SUPPLIES PER DR. PEMBE#  She would be a perfect fit to have Dr. Mayford Knife manage all her cardiac needs.  I think one person driving the ship will make her more compliant.  Thanks so much for asking and great idea.  Thanks Lajoyce Corners ----- Message ----- From: Luellen Pucker, RN Sent: 03/14/2023   8:20 AM EDT To: Loa Socks, LPN Subject: FW: NEEDS SLEEP STUDY SUPPLIES PER DR. PEMBE#  Hi Rosevelt Luu, Just closing the loop of communication here. I've been trying to follow up on this for awhile. Looks like patient refused equipment due to cost.   She has a f/u appt w/ Lorin Picket 05/22/23 but is she one that needs to transfer to [facility] ? Alcario Drought, RN ----- Message ----- From: Reesa Chew, CMA Sent: 03/07/2023   2:02 PM EDT To: Luellen Pucker, RN Subject: RE: NEEDS SLEEP STUDY SUPPLIES PER DR. PEMBE#  New, Madison Hickman, CMA; Henderson Newcomer; New, [place],  11-15-22 attempt : Patient was not setup, said they could not afford. Tried other pap unit and patient never answered any of our call attempts and never called back. order was voided. 01-01-23.    We also tried on 04-26-22 and patient never returned calls or answered the phone, order was voided on 07/21/22. ----- Message ----- From: Luellen Pucker, RN Sent: 03/02/2023   1:34 PM EDT To: Reesa Chew, CMA Subject: RE: NEEDS SLEEP STUDY SUPPLIES PER DR. PEMBE#  Checking on whether this lady has her  machine yet? Alcario Drought ----- Message ----- From:  Quintella Reichert, MD Sent: 11/13/2022  12:26 PM EDT To: Loa Socks, LPN; Reesa Chew, CMA; # Subject: RE: NEEDS SLEEP STUDY SUPPLIES PER DR. Lanae Boast please followup with DME for her to get her BIPAP device ----- Message ----- From: Loa Socks, LPN Sent: 4/54/0981   9:42 AM EDT To: Quintella Reichert, MD; Reesa Chew, CMA; # Subject: NEEDS SLEEP STUDY SUPPLIES PER DR. Shari Prows   This pt had her sleep study done by you guys and got the results and was suggested that she get CPAP, which she never pursued  Dr. Shari Prows advised her to get the CPAP supplies and that you will be reaching out to her soon to help coordinate this.    Coralee North can you please assist with this?  Thanks [employer]

## 2023-04-15 ENCOUNTER — Other Ambulatory Visit: Payer: Self-pay | Admitting: Infectious Disease

## 2023-05-09 ENCOUNTER — Telehealth: Payer: Self-pay

## 2023-05-09 NOTE — Telephone Encounter (Signed)
Pt is scheduled for follow up Echo on 9/27 and office visit on 10/1 with Tereso Newcomer PA-C.  I left the patient a message that I am happy to get her scheduled with Dr Clifton James to follow up on echo results if she would prefer seeing the MD.

## 2023-05-18 ENCOUNTER — Ambulatory Visit (HOSPITAL_COMMUNITY): Payer: PRIVATE HEALTH INSURANCE | Attending: Cardiology

## 2023-05-18 DIAGNOSIS — I350 Nonrheumatic aortic (valve) stenosis: Secondary | ICD-10-CM

## 2023-05-18 LAB — ECHOCARDIOGRAM COMPLETE
AR max vel: 0.79 cm2
AV Area VTI: 0.87 cm2
AV Area mean vel: 0.77 cm2
AV Mean grad: 50 mm[Hg]
AV Peak grad: 90.6 mm[Hg]
Ao pk vel: 4.76 m/s
Area-P 1/2: 3.36 cm2
S' Lateral: 2.9 cm

## 2023-05-22 ENCOUNTER — Other Ambulatory Visit: Payer: Self-pay

## 2023-05-22 ENCOUNTER — Encounter: Payer: Self-pay | Admitting: Physician Assistant

## 2023-05-22 ENCOUNTER — Ambulatory Visit: Payer: PRIVATE HEALTH INSURANCE | Attending: Physician Assistant | Admitting: Physician Assistant

## 2023-05-22 VITALS — BP 118/60 | HR 72 | Ht 65.0 in | Wt 276.0 lb

## 2023-05-22 DIAGNOSIS — E7800 Pure hypercholesterolemia, unspecified: Secondary | ICD-10-CM | POA: Insufficient documentation

## 2023-05-22 DIAGNOSIS — G4733 Obstructive sleep apnea (adult) (pediatric): Secondary | ICD-10-CM | POA: Diagnosis not present

## 2023-05-22 DIAGNOSIS — I350 Nonrheumatic aortic (valve) stenosis: Secondary | ICD-10-CM

## 2023-05-22 DIAGNOSIS — I10 Essential (primary) hypertension: Secondary | ICD-10-CM | POA: Diagnosis not present

## 2023-05-22 MED ORDER — ROSUVASTATIN CALCIUM 10 MG PO TABS
10.0000 mg | ORAL_TABLET | Freq: Every day | ORAL | 1 refills | Status: DC
Start: 2023-05-22 — End: 2024-01-22

## 2023-05-22 NOTE — Progress Notes (Signed)
Cardiology Office Note:    Date:  05/22/2023  ID:  Nicole Hines, DOB February 27, 1961, MRN [id number] PCP: Creola Corn, MD  [facility] Providers Cardiologist:  Meriam Sprague, MD       Patient Profile:      Severe aortic stenosis TTE 04/06/2022: EF 68, GR 2 DD, moderate aortic stenosis (mean 33.6, V-max 389 cm/s, DI 0.26) TTE 11/02/22: EF 60-65, GR 2 DD, GLS -20.7, severe AS (mean 46, V-max 444 cm/s, DI 0.25) TTE 05/18/2023: EF 55-60, no RWMA, GR 2 DD, normal RVSF, mild LAE, severe aortic stenosis (mean 50 mmHg, V-max 476 cm/s, DI 0.28), RAP 3 Hx of PEA arrest 08/2021 Felt to be multifactorial and no cardiac cause  Post arrest AFib w RVR  Supraventricular Tachycardia  Monitor 08/2021: NSR; 50 runs nonsustained SVT (11 beats) rare PACs, PVCs 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  Nicole Hines is a 62 y.o. female who returns for follow up of aortic stenosis She was evaluated by Dr. Clifton James in 10/2022 for aortic stenosis. She was felt to be asymptomatic. A 6 mos follow up was planned. Her echocardiogram on 9/27 demonstrated severe aortic stenosis with mean gradient 50 mmHg. Plan is to continue follow up echocardiograms every 6 mos unless she becomes symptomatic.  She reports no symptoms related to her aortic stenosis, such as chest pain, shortness of breath, or syncope. She is able to carry out her physically demanding job without any difficulty. She does report some fatigue, but this is chronic and stable. The patient also has a history of sleep apnea, for which she is not currently using a CPAP machine due to financial constraints. She also reports arthritis in multiple joints, which causes her some difficulty, particularly with climbing stairs. The patient has noticed gradual  weight gain recently, which she attributes to changes in her work schedule and eating habits.      Review of Systems  Gastrointestinal:  Negative for hematochezia and melena.  Genitourinary:  Negative for hematuria.  See HPI     Studies Reviewed:        Risk Assessment/Calculations:             Physical Exam:   VS:  BP 118/60   Pulse 72   Ht 5\' 5"  (1.651 m)   Wt 276 lb (125.2 kg)   LMP 02/18/2014   SpO2 97%   BMI 45.93 kg/m    Wt Readings from Last 3 Encounters:  05/22/23 276 lb (125.2 kg)  11/28/22 255 lb (115.7 kg)  11/16/22 259 lb 3.2 oz (117.6 kg)    Constitutional:      Appearance: Healthy appearance. Not in distress.  Neck:     Vascular: JVD normal.  Pulmonary:     Breath sounds: Normal breath sounds. No wheezing. No rales.  Cardiovascular:     Normal rate. Regular rhythm.     Murmurs: There is no murmur.  Edema:    Peripheral edema absent.  Abdominal:     Palpations: Abdomen is soft.        Assessment and Plan:   Assessment & Plan Severe aortic stenosis Echocardiogram on 05/18/2023 showed mean gradient of 50, Vmax 444 cm/s and DI of 0.28. Pt currently asymptomatic with no chest pain, heart  failure symptoms, or syncope. Discussed the potential need for surgical intervention if symptoms develop or if echocardiogram demonstrates further progression to critical AS. -Repeat echocardiogram in 6 months. -Follow up 6 mos w Dr. Clifton James -Return sooner if symptoms develop. Primary hypertension Controlled. -Continue Telmisartan 40mg  daily. Pure hypercholesterolemia -Continue Rosuvastatin 10mg  daily. -She has follow up labs for annual physical with primary care soon. OSA (obstructive sleep apnea) Patient has been diagnosed but is not currently using CPAP due to cost. Discussed potential health risks associated with untreated sleep apnea. -Pt to contact us if she is ready to arrange CPAP again.        Dispo:  Return in about 6 months (around 11/20/2023) for  Routine Follow Up w/ Dr. Clifton James (after echo).  Signed, Tereso Newcomer, PA-C

## 2023-05-22 NOTE — Assessment & Plan Note (Signed)
-  Continue Rosuvastatin 10mg  daily. -She has follow up labs for annual physical with primary care soon.

## 2023-05-22 NOTE — Assessment & Plan Note (Signed)
Echocardiogram on 05/18/2023 showed mean gradient of 50, Vmax 444 cm/s and DI of 0.28. Pt currently asymptomatic with no chest pain, heart failure symptoms, or syncope. Discussed the potential need for surgical intervention if symptoms develop or if echocardiogram demonstrates further progression to critical AS. -Repeat echocardiogram in 6 months. -Follow up 6 mos w Dr. Clifton James -Return sooner if symptoms develop.

## 2023-05-22 NOTE — Assessment & Plan Note (Signed)
Controlled. -Continue Telmisartan 40mg  daily.

## 2023-05-22 NOTE — Patient Instructions (Addendum)
Medication Instructions:  Your physician recommends that you continue on your current medications as directed. Please refer to the Current Medication list given to you today.  *If you need a refill on your cardiac medications before your next appointment, please call your pharmacy*   Lab Work: None ordered  If you have labs (blood work) drawn today and your tests are completely normal, you will receive your results only by: MyChart Message (if you have MyChart) OR A paper copy in the mail If you have any lab test that is abnormal or we need to change your treatment, we will call you to review the results.   Testing/Procedures: Your physician has requested that you have an echocardiogram 6 MONTHS. Echocardiography is a painless test that uses sound waves to create images of your heart. It provides your doctor with information about the size and shape of your heart and how well your heart's chambers and valves are working. This procedure takes approximately one hour. There are no restrictions for this procedure. Please do NOT wear cologne, perfume, aftershave, or lotions (deodorant is allowed). Please arrive 15 minutes prior to your appointment time.    Follow-Up: At [facility], you and your health needs are our priority.  As part of our continuing mission to provide you with exceptional heart care, we have created designated Provider Care Teams.  These Care Teams include your primary Cardiologist (physician) and Advanced Practice Providers (APPs -  Physician Assistants and Nurse Practitioners) who all work together to provide you with the care you need, when you need it.  We recommend signing up for the patient portal called "MyChart".  Sign up information is provided on this After Visit Summary.  MyChart is used to connect with patients for Virtual Visits (Telemedicine).  Patients are able to view lab/test results, encounter notes, upcoming appointments, etc.  Non-urgent messages  can be sent to your provider as well.   To learn more about what you can do with MyChart, go to [email or web address].    Your next appointment:   6 month(s)  AFTER ECHO  Provider:   Meriam Sprague, MD     Other Instructions

## 2023-05-22 NOTE — Assessment & Plan Note (Signed)
Patient has been diagnosed but is not currently using CPAP due to cost. Discussed potential health risks associated with untreated sleep apnea. -Pt to contact us if she is ready to arrange CPAP again.

## 2023-05-24 ENCOUNTER — Ambulatory Visit: Payer: PRIVATE HEALTH INSURANCE | Admitting: Infectious Disease

## 2023-08-08 ENCOUNTER — Other Ambulatory Visit: Payer: Self-pay | Admitting: Infectious Disease

## 2023-11-21 ENCOUNTER — Ambulatory Visit (HOSPITAL_COMMUNITY): Payer: PRIVATE HEALTH INSURANCE | Attending: Cardiology

## 2023-11-21 DIAGNOSIS — I350 Nonrheumatic aortic (valve) stenosis: Secondary | ICD-10-CM

## 2023-11-21 LAB — ECHOCARDIOGRAM COMPLETE
AR max vel: 0.66 cm2
AV Area VTI: 0.79 cm2
AV Area mean vel: 0.73 cm2
AV Mean grad: 46 mmHg
AV Peak grad: 80.3 mmHg
Ao pk vel: 4.48 m/s
Area-P 1/2: 2.97 cm2
P 1/2 time: 764 ms
S' Lateral: 3.1 cm

## 2023-11-29 ENCOUNTER — Ambulatory Visit: Payer: PRIVATE HEALTH INSURANCE | Admitting: Cardiovascular Disease

## 2023-12-25 ENCOUNTER — Telehealth: Payer: Self-pay

## 2023-12-25 NOTE — Telephone Encounter (Signed)
 Pt cancelled 4/10 appointment with Dr Abel Hoe.  I left her a message to contact the office to reschedule.

## 2024-01-07 ENCOUNTER — Ambulatory Visit: Payer: PRIVATE HEALTH INSURANCE | Attending: Cardiovascular Disease | Admitting: Cardiovascular Disease

## 2024-01-07 ENCOUNTER — Encounter: Payer: Self-pay | Admitting: Cardiovascular Disease

## 2024-01-07 VITALS — BP 152/88 | HR 65 | Ht 65.0 in | Wt 280.0 lb

## 2024-01-07 DIAGNOSIS — I350 Nonrheumatic aortic (valve) stenosis: Secondary | ICD-10-CM

## 2024-01-07 DIAGNOSIS — Z01812 Encounter for preprocedural laboratory examination: Secondary | ICD-10-CM

## 2024-01-07 NOTE — H&P (View-Only) (Signed)
 [facility] Note  Chief Complaint  Patient presents with   Follow-up    Aortic stenosis    History of Present Illness: 63 yo female with history of anxiety, asthma, MRSA bacteremia with chronic osteomyelitis of sacrum, HTN, morbid obesity, prior PEA arrest and severe aortic stenosis who is here today for follow up. She had been followed in our office by Dr. Ardell Beauvais. I saw her as a new consult in March 2024 for discussion regarding her aortic stenosis and possible AVR or TAVR. She was hospitalized in May 2022 with MRSA bacteremia with sacro-iliac osteomyelitis. TEE at that time without evidence of vegetation. She underwent drainage of the joint and was treated with antibiotics. She was then admitted to [facility] in January 2023 after being found unresponsive. She was in PEA when EMS arrived and was quickly resuscitated. Post code rhythm was atrial fib with RVR. Her confusion post arrest resolved when she was given Narcan . Her arrest was not felt to be from a cardiac event. Cardiac monitor after discharge showed sinus with no recurrence of atrial fib. Echo March 2024 with LVEF=60-65%, grade 2 diastolic dysfunction. Normal RV size and function, mild MR. Severe aortic stenosis with mean gradient 46 mmHg, AVA 0.78 cm2, dimensionless index 0.25. She was asymptomatic when I saw her in the office in March 2024. We elected to follow her aortic stenosis. She agreed to follow with ID for her osteomyelitis and agreed to see a dentist. Echo April 2025 with LVEF=55-60%. Severe aortic stenosis with mean gradient 46 mmHg, AVA 0.66 cm2, SVI 32, DI 0.25.   She is here today for follow up. She has progressive dyspnea on exertion. She denies chest pain, palpitations, lower extremity edema, orthopnea, PND, dizziness, near syncope or syncope.   She lives in [place], [place] and her son lives with her. She works at [employer] in [employer]. She has not seen a dentist yet as advised at her last  visit.   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 abdomen   TEE WITHOUT CARDIOVERSION N/A 12/27/2020   Procedure: TRANSESOPHAGEAL ECHOCARDIOGRAM (TEE);  Surgeon: Sonny Dust, MD;  Location: [facility] ENDOSCOPY;  Service: Cardiovascular;  Laterality: N/A;    Current Outpatient Medications  Medication Sig Dispense Refill   albuterol  (VENTOLIN  HFA) 108 (90 Base) MCG/ACT inhaler Inhale 2 puffs into the lungs every 4 (four) hours as needed for shortness of breath or wheezing.     ALPRAZolam  (XANAX ) 0.5 MG tablet Take 1 mg by mouth at bedtime. For anxiety     Calcium  Carbonate-Vitamin D (CALCIUM  600+D3  PO) Take 1 tablet by mouth daily.     citalopram (CELEXA) 40 MG tablet Take 40 mg by mouth every morning.     clobetasol cream (TEMOVATE) 0.05 % Apply 1 application  topically as needed. Around lips     Doxylamine Succinate, Sleep, (SLEEP AID PO) Take by mouth.     fish oil-omega-3 fatty acids 1000 MG capsule Take 1 g by mouth 2 (two) times daily.     gabapentin  (NEURONTIN ) 100 MG capsule Take 300 mg by mouth at bedtime.     Glucosamine-Chondroit-Vit C-Mn (GLUCOSAMINE 1500  COMPLEX PO) Take 1 capsule by mouth 2 (two) times daily.     montelukast  (SINGULAIR ) 10 MG tablet Take 10 mg by mouth every morning.      Multiple Vitamin (MULTIVITAMIN WITH MINERALS) TABS tablet Take 1 tablet by mouth daily.     omeprazole  (PRILOSEC) 20 MG capsule Take 1 capsule (20 mg total) by mouth daily before breakfast. 30 capsule 0   oxyCODONE -acetaminophen  (PERCOCET ) 10-325 MG per tablet Take 1 tablet by mouth every 6 (six) hours as needed for pain.      polyethylene glycol (MIRALAX  / GLYCOLAX ) 17 g packet Take 17 g by mouth daily as needed for severe constipation. 14 each 2   rosuvastatin  (CRESTOR ) 10 MG tablet Take 1 tablet (10 mg total) by mouth daily. 90 tablet 1   telmisartan (MICARDIS) 40 MG tablet Take 40 mg by mouth daily.     tiZANidine (ZANAFLEX) 4 MG tablet Take 4 mg by mouth every 6 (six) hours as needed.     vitamin B-12 (CYANOCOBALAMIN) 1000 MCG tablet Take 1,000 mcg by mouth daily.     No current facility-administered medications for this visit.    No Known Allergies  Social History   Socioeconomic History   Marital status: Divorced    Spouse name: Not on file   Number of children: 2   Years of education: Not on file   Highest education level: Not on file  Occupational History   Occupation: She works at [employer] in [employer]  Tobacco Use   Smoking status: Never   Smokeless tobacco: Never  Vaping Use   Vaping status: Never Used  Substance and Sexual Activity   Alcohol use: No   Drug use: No   Sexual activity: Yes    Partners: Female    Birth control/protection: Surgical    Comment: btl  Other Topics Concern   Not on file  Social History Narrative   Not on file   Social Drivers of Health   Financial Resource Strain: Not on file  Food Insecurity: Not on file  Transportation Needs: Not on file  Physical Activity: Not on file  Stress: Not on file  Social Connections: Not on file  Intimate Partner Violence: Not on file     Family History  Problem Relation Age of Onset   Cancer Mother        pancreatic   Diabetes Mother    Hypertension Mother    Heart attack Father 31   Diabetes Brother    Heart attack Maternal Grandmother    Cancer Maternal Aunt        uterine   Diabetes Maternal Aunt    Colon cancer Neg Hx     Review of Systems:  As stated in the HPI and otherwise negative.   BP (!) 152/88   Pulse 65   Ht 5\' 5"  (1.651 m)   Wt 280 lb (127 kg)  LMP 02/18/2014   SpO2 97%   BMI 46.59 kg/m   Physical Examination: General: Well developed, well nourished, NAD  HEENT: OP clear, mucus membranes moist  SKIN: warm, dry. No rashes. Neuro: No focal deficits  Musculoskeletal: Muscle strength 5/5 all ext  Psychiatric: Mood and affect normal  Neck: No JVD, no carotid bruits, no thyromegaly, no lymphadenopathy.  Lungs:Clear bilaterally, no wheezes, rhonci, crackles Cardiovascular: Regular rate and rhythm. Harsh systolic murmur.  Abdomen:Soft. Bowel sounds present. Non-tender.  Extremities: No lower extremity edema. Pulses are 2 + in the bilateral DP/PT.  EKG:  EKG is ordered today. EKG is reviewed by me and shows EKG Interpretation Date/Time:  Monday Jan 07 2024 11:31:52 EDT Ventricular Rate:  58 PR Interval:  148 QRS Duration:  118 QT Interval:  430 QTC Calculation: 422 R Axis:   -11  Text Interpretation: Sinus bradycardia Incomplete right bundle branch block Poor R wave progression Confirmed by Antoinette Batman [phone]) on 01/07/2024 11:52:29 AM    Echo April 2025:    1. Left ventricular ejection fraction, by estimation, is 55 to 60%. Left  ventricular ejection fraction by 3D volume is 53 %. The left ventricle has  normal function. The left ventricle has no regional wall motion  abnormalities. There is mild left  ventricular hypertrophy. Left ventricular diastolic parameters are  consistent with Grade I diastolic dysfunction (impaired relaxation). The  average left ventricular  global longitudinal strain is -16.8 %. The global  longitudinal strain is abnormal.   2. Right ventricular systolic function is normal. The right ventricular  size is normal. There is normal pulmonary artery systolic pressure.   3. Left atrial size was mildly dilated.   4. A small pericardial effusion is present. There is no evidence of  cardiac tamponade.   5. The mitral valve is normal in structure. Trivial mitral valve  regurgitation. No evidence of mitral stenosis.   6. The aortic valve is calcified. There is severe calcifcation of the  aortic valve. Aortic valve regurgitation is mild. Severe aortic valve  stenosis. Aortic valve area, by VTI measures 0.79 cm. Aortic valve mean  gradient measures 46.0 mmHg. Aortic  valve Vmax measures 4.48 m/s.   7. The inferior vena cava is normal in size with greater than 50%  respiratory variability, suggesting right atrial pressure of 3 mmHg.   FINDINGS   Left Ventricle: Left ventricular ejection fraction, by estimation, is 55  to 60%. Left ventricular ejection fraction by 3D volume is 53 %. The left  ventricle has normal function. The left ventricle has no regional wall  motion abnormalities. The average  left ventricular global longitudinal strain is -16.8 %. Strain was  performed and the global longitudinal strain is abnormal. The left  ventricular internal cavity size was normal in size. There is mild left  ventricular hypertrophy. Left ventricular  diastolic parameters are consistent with Grade I diastolic dysfunction  (impaired relaxation).   Right Ventricle: The right ventricular size is normal. No increase in  right ventricular wall thickness. Right ventricular systolic function is  normal. There is normal pulmonary artery systolic pressure. The tricuspid  regurgitant velocity is 2.18 m/s, and   with an assumed right atrial pressure of 3 mmHg, the estimated right  ventricular systolic pressure is 22.0 mmHg.   Left Atrium: Left  atrial size was mildly dilated.   Right Atrium: Right atrial size was not assessed.   Pericardium: A small pericardial effusion is present. There is no evidence  of cardiac tamponade.  Mitral Valve: The mitral valve is normal in structure. Trivial mitral  valve regurgitation. No evidence of mitral valve stenosis.   Tricuspid Valve: The tricuspid valve is normal in structure. Tricuspid  valve regurgitation is mild.   Aortic Valve: The aortic valve is calcified. There is severe calcifcation  of the aortic valve. Aortic valve regurgitation is mild. Aortic  regurgitation PHT measures 764 msec. Severe aortic stenosis is present.  Aortic valve mean gradient measures 46.0  mmHg. Aortic valve peak gradient measures 80.3 mmHg. Aortic valve area, by  VTI measures 0.79 cm.   Pulmonic Valve: The pulmonic valve was normal in structure. Pulmonic valve  regurgitation is not visualized.   Aorta: The aortic root and ascending aorta are structurally normal, with  no evidence of dilitation.   Venous: The inferior vena cava is normal in size with greater than 50%  respiratory variability, suggesting right atrial pressure of 3 mmHg.   IAS/Shunts: No atrial level shunt detected by color flow Doppler.   Additional Comments: 3D was performed not requiring image post processing  on an independent workstation and was normal.     LEFT VENTRICLE  PLAX 2D  LVIDd:         5.00 cm         Diastology  LVIDs:         3.10 cm         LV e' medial:    5.66 cm/s  LV PW:         1.20 cm         LV E/e' medial:  17.2  LV IVS:        1.00 cm         LV e' lateral:   6.74 cm/s  LVOT diam:     2.00 cm         LV E/e' lateral: 14.5  LV SV:         72  LV SV Index:   32              2D Longitudinal  LVOT Area:     3.14 cm        Strain                                 2D Strain GLS   -17.3 %                                 (A4C):                                 2D Strain GLS   -20.2 %                                  (A3C):                                 2D Strain GLS   -12.8 %                                 (A2C):  2D Strain GLS   -16.8 %                                 Avg:                                   3D Volume EF                                 LV 3D EF:    Left                                              ventricul                                              ar                                              ejection                                              fraction                                              by 3D                                              volume is                                              53 %.                                   3D Volume EF:                                 3D EF:        53 %                                 LV EDV:       125 ml                                 LV ESV:  59 ml                                 LV SV:        67 ml   RIGHT VENTRICLE  RV Basal diam:  3.80 cm  RV S prime:     10.40 cm/s  TAPSE (M-mode): 2.5 cm  RVSP:           22.0 mmHg   LEFT ATRIUM             Index        RIGHT ATRIUM           Index  LA diam:        3.90 cm 1.72 cm/m   RA Pressure: 3.00 mmHg  LA Vol (A2C):   80.8 ml 35.62 ml/m  RA Area:     16.50 cm  LA Vol (A4C):   52.4 ml 23.10 ml/m  RA Volume:   39.60 ml  17.46 ml/m  LA Biplane Vol: 69.9 ml 30.82 ml/m   AORTIC VALVE  AV Area (Vmax):    0.66 cm  AV Area (Vmean):   0.73 cm  AV Area (VTI):     0.79 cm  AV Vmax:           448.00 cm/s  AV Vmean:          265.600 cm/s  AV VTI:            0.916 m  AV Peak Grad:      80.3 mmHg  AV Mean Grad:      46.0 mmHg  LVOT Vmax:         94.30 cm/s  LVOT Vmean:        61.300 cm/s  LVOT VTI:          0.230 m  LVOT/AV VTI ratio: 0.25  AI PHT:            764 msec    AORTA  Ao Root diam: 3.00 cm  Ao Asc diam:  3.10 cm   MITRAL VALVE               TRICUSPID VALVE  MV Area (PHT):             TR Peak grad:   19.0  mmHg  MV Decel Time:             TR Vmax:        218.00 cm/s  MV E velocity: 97.50 cm/s  Estimated RAP:  3.00 mmHg  MV A velocity: 90.10 cm/s  RVSP:           22.0 mmHg  MV E/A ratio:  1.08                             SHUNTS                             Systemic VTI:  0.23 m                             Systemic Diam: 2.00 cm   Recent Labs: No results found for requested labs within last 365 days.   Lipid Panel    Component Value Date/Time   CHOL 174 08/26/2021 0344   TRIG  21 08/26/2021 0344   HDL 62 08/26/2021 0344   CHOLHDL 2.8 08/26/2021 0344   VLDL 4 08/26/2021 0344   LDLCALC 108 (H) 08/26/2021 0344     Wt Readings from Last 3 Encounters:  01/07/24 280 lb (127 kg)  05/22/23 276 lb (125.2 kg)  11/28/22 255 lb (115.7 kg)    Assessment and Plan:   1. Severe Aortic Valve Stenosis: She has severe aortic stenosis. NYHA class 2 symptoms. She has progressive dyspnea with moderate exertion. I have personally reviewed the echo images. The aortic valve is thickened and calcified with limited leaflet mobility. I think she would benefit from AVR. She would be a candidate for surgical AVR or TAVR.    I have reviewed the natural history of aortic stenosis with the patient and their family members  who are present today. We have discussed the limitations of medical therapy and the poor prognosis associated with symptomatic aortic stenosis. We have reviewed potential treatment options, including palliative medical therapy, conventional surgical aortic valve replacement, and transcatheter aortic valve replacement. We discussed treatment options in the context of the patient's specific comorbid medical conditions.   She would like to proceed with planning for TAVR. I will arrange a right and left heart catheterization at [facility] 01/23/24. Risks and benefits of the cath procedure and the valve procedure are reviewed with the patient. After the cath, she will have a cardiac CT, CTA of the chest/abdomen and  pelvis and will then be referred to see one of the CT surgeons on our TAVR team.   In the meantime, she will see her dentist to have a full evaluation with probable dental extraction prior to her AVR.   BMET and CBC today     Labs/ tests ordered today include:  Orders Placed This Encounter  Procedures   Basic metabolic panel with GFR   CBC   EKG 12-Lead   Disposition:   F/U with the structural team post cath.   Signed, Antoinette Batman, MD, [facility] 01/07/2024 1:23 PM    [facility] [address] [place], [place], [place]  [address] Phone: [phone]; Fax: [phone]

## 2024-01-07 NOTE — Progress Notes (Addendum)
 Pre Surgical Assessment: 5 M Walk Test  34M=16.63ft  5 Meter Walk Test- trial 1: 4.23 seconds 5 Meter Walk Test- trial 2: 4.23 seconds 5 Meter Walk Test- trial 3: 4.13 seconds 5 Meter Walk Test Average: 4.19 seconds  _____________________   Procedure Type: Isolated AVR Perioperative Outcome Estimate % Operative Mortality 3.03% Morbidity & Mortality 7.73% Stroke 0.49% Renal Failure 2.12% Reoperation 2.12% Prolonged Ventilation 4.23% Deep Sternal Wound Infection 0.186% Long Hospital Stay (>14 days) 3.04% Short Hospital Stay (<6 days)* 49%

## 2024-01-07 NOTE — Patient Instructions (Signed)
 Medication Instructions:  No changes *If you need a refill on your cardiac medications before your next appointment, please call your pharmacy*  Lab Work: Today; bmet, cbc   Testing/Procedures: Your physician has requested that you have a cardiac catheterization. Cardiac catheterization is used to diagnose and/or treat various heart conditions. Doctors may recommend this procedure for a number of different reasons. The most common reason is to evaluate chest pain. Chest pain can be a symptom of coronary artery disease (CAD), and cardiac catheterization can show whether plaque is narrowing or blocking your heart's arteries. This procedure is also used to evaluate the valves, as well as measure the blood flow and oxygen levels in different parts of your heart. For further information please visit [email or web address] Please follow instruction sheet, as given.  Follow-Up: Per Structural Heart Team        Cardiac/Peripheral Catheterization   You are scheduled for a Cardiac Catheterization on Wednesday, June 4 with Dr. Antoinette Batman.  1. Please arrive at the [facility] (Main Entrance A) at [facility]: [address] [place], [place] [address] at 8:00 AM (This time is TWO hour(s) before your procedure to ensure your preparation).   Free valet parking service is available. You will check in at ADMITTING. The support person will be asked to wait in the waiting room.  It is OK to have someone drop you off and come back when you are ready to be discharged.        Special note: Every effort is made to have your procedure done on time. Please understand that emergencies sometimes delay scheduled procedures.  2. Diet: Do not eat solid foods after midnight.  You may have clear liquids until 5 AM the day of the procedure.  3. Labs: You will need to have blood drawn today.  You do not need to be fasting.  4. Medication instructions in preparation for your procedure:   Contrast  Allergy: No   On the morning of your procedure, take Aspirin 81 mg and any morning medicines NOT listed above.  You may use sips of water.  5. Plan to go home the same day, you will only stay overnight if medically necessary. 6. You MUST have a responsible adult to drive you home. 7. An adult MUST be with you the first 24 hours after you arrive home. 8. Bring a current list of your medications, and the last time and date medication taken. 9. Bring ID and current insurance cards. 10.Please wear clothes that are easy to get on and off and wear slip-on shoes.  Thank you for allowing us  to care for you!   -- [facility]

## 2024-01-07 NOTE — Progress Notes (Signed)
 Structural Heart Clinic Note  Chief Complaint  Patient presents with   Follow-up    Aortic stenosis    History of Present Illness: 63 yo female with history of anxiety, asthma, MRSA bacteremia with chronic osteomyelitis of sacrum, HTN, morbid obesity, prior PEA arrest and severe aortic stenosis who is here today for follow up. She had been followed in our office by Dr. Ardell Beauvais. I saw her as a new consult in March 2024 for discussion regarding her aortic stenosis and possible AVR or TAVR. She was hospitalized in May 2022 with MRSA bacteremia with sacro-iliac osteomyelitis. TEE at that time without evidence of vegetation. She underwent drainage of the joint and was treated with antibiotics. She was then admitted to [facility] in January 2023 after being found unresponsive. She was in PEA when EMS arrived and was quickly resuscitated. Post code rhythm was atrial fib with RVR. Her confusion post arrest resolved when she was given Narcan . Her arrest was not felt to be from a cardiac event. Cardiac monitor after discharge showed sinus with no recurrence of atrial fib. Echo March 2024 with LVEF=60-65%, grade 2 diastolic dysfunction. Normal RV size and function, mild MR. Severe aortic stenosis with mean gradient 46 mmHg, AVA 0.78 cm2, dimensionless index 0.25. She was asymptomatic when I saw her in the office in March 2024. We elected to follow her aortic stenosis. She agreed to follow with ID for her osteomyelitis and agreed to see a dentist. Echo April 2025 with LVEF=55-60%. Severe aortic stenosis with mean gradient 46 mmHg, AVA 0.66 cm2, SVI 32, DI 0.25.   She is here today for follow up. She has progressive dyspnea on exertion. She denies chest pain, palpitations, lower extremity edema, orthopnea, PND, dizziness, near syncope or syncope.   She lives in [place], [place] and her son lives with her. She works at [employer] in [employer]. She has not seen a dentist yet as advised at her last  visit.   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 abdomen   TEE WITHOUT CARDIOVERSION N/A 12/27/2020   Procedure: TRANSESOPHAGEAL ECHOCARDIOGRAM (TEE);  Surgeon: Sonny Dust, MD;  Location: [facility] ENDOSCOPY;  Service: Cardiovascular;  Laterality: N/A;    Current Outpatient Medications  Medication Sig Dispense Refill   albuterol  (VENTOLIN  HFA) 108 (90 Base) MCG/ACT inhaler Inhale 2 puffs into the lungs every 4 (four) hours as needed for shortness of breath or wheezing.     ALPRAZolam  (XANAX ) 0.5 MG tablet Take 1 mg by mouth at bedtime. For anxiety     Calcium  Carbonate-Vitamin D (CALCIUM  600+D3  PO) Take 1 tablet by mouth daily.     citalopram (CELEXA) 40 MG tablet Take 40 mg by mouth every morning.     clobetasol cream (TEMOVATE) 0.05 % Apply 1 application  topically as needed. Around lips     Doxylamine Succinate, Sleep, (SLEEP AID PO) Take by mouth.     fish oil-omega-3 fatty acids 1000 MG capsule Take 1 g by mouth 2 (two) times daily.     gabapentin  (NEURONTIN ) 100 MG capsule Take 300 mg by mouth at bedtime.     Glucosamine-Chondroit-Vit C-Mn (GLUCOSAMINE 1500  COMPLEX PO) Take 1 capsule by mouth 2 (two) times daily.     montelukast  (SINGULAIR ) 10 MG tablet Take 10 mg by mouth every morning.      Multiple Vitamin (MULTIVITAMIN WITH MINERALS) TABS tablet Take 1 tablet by mouth daily.     omeprazole  (PRILOSEC) 20 MG capsule Take 1 capsule (20 mg total) by mouth daily before breakfast. 30 capsule 0   oxyCODONE -acetaminophen  (PERCOCET ) 10-325 MG per tablet Take 1 tablet by mouth every 6 (six) hours as needed for pain.      polyethylene glycol (MIRALAX  / GLYCOLAX ) 17 g packet Take 17 g by mouth daily as needed for severe constipation. 14 each 2   rosuvastatin  (CRESTOR ) 10 MG tablet Take 1 tablet (10 mg total) by mouth daily. 90 tablet 1   telmisartan (MICARDIS) 40 MG tablet Take 40 mg by mouth daily.     tiZANidine (ZANAFLEX) 4 MG tablet Take 4 mg by mouth every 6 (six) hours as needed.     vitamin B-12 (CYANOCOBALAMIN) 1000 MCG tablet Take 1,000 mcg by mouth daily.     No current facility-administered medications for this visit.    No Known Allergies  Social History   Socioeconomic History   Marital status: Divorced    Spouse name: Not on file   Number of children: 2   Years of education: Not on file   Highest education level: Not on file  Occupational History   Occupation: She works at [employer] in [employer]  Tobacco Use   Smoking status: Never   Smokeless tobacco: Never  Vaping Use   Vaping status: Never Used  Substance and Sexual Activity   Alcohol use: No   Drug use: No   Sexual activity: Yes    Partners: Female    Birth control/protection: Surgical    Comment: btl  Other Topics Concern   Not on file  Social History Narrative   Not on file   Social Drivers of Health   Financial Resource Strain: Not on file  Food Insecurity: Not on file  Transportation Needs: Not on file  Physical Activity: Not on file  Stress: Not on file  Social Connections: Not on file  Intimate Partner Violence: Not on file     Family History  Problem Relation Age of Onset   Cancer Mother        pancreatic   Diabetes Mother    Hypertension Mother    Heart attack Father 31   Diabetes Brother    Heart attack Maternal Grandmother    Cancer Maternal Aunt        uterine   Diabetes Maternal Aunt    Colon cancer Neg Hx     Review of Systems:  As stated in the HPI and otherwise negative.   BP (!) 152/88   Pulse 65   Ht 5\' 5"  (1.651 m)   Wt 280 lb (127 kg)  LMP 02/18/2014   SpO2 97%   BMI 46.59 kg/m   Physical Examination: General: Well developed, well nourished, NAD  HEENT: OP clear, mucus membranes moist  SKIN: warm, dry. No rashes. Neuro: No focal deficits  Musculoskeletal: Muscle strength 5/5 all ext  Psychiatric: Mood and affect normal  Neck: No JVD, no carotid bruits, no thyromegaly, no lymphadenopathy.  Lungs:Clear bilaterally, no wheezes, rhonci, crackles Cardiovascular: Regular rate and rhythm. Harsh systolic murmur.  Abdomen:Soft. Bowel sounds present. Non-tender.  Extremities: No lower extremity edema. Pulses are 2 + in the bilateral DP/PT.  EKG:  EKG is ordered today. EKG is reviewed by me and shows EKG Interpretation Date/Time:  Monday Jan 07 2024 11:31:52 EDT Ventricular Rate:  58 PR Interval:  148 QRS Duration:  118 QT Interval:  430 QTC Calculation: 422 R Axis:   -11  Text Interpretation: Sinus bradycardia Incomplete right bundle branch block Poor R wave progression Confirmed by Antoinette Batman [phone]) on 01/07/2024 11:52:29 AM    Echo April 2025:    1. Left ventricular ejection fraction, by estimation, is 55 to 60%. Left  ventricular ejection fraction by 3D volume is 53 %. The left ventricle has  normal function. The left ventricle has no regional wall motion  abnormalities. There is mild left  ventricular hypertrophy. Left ventricular diastolic parameters are  consistent with Grade I diastolic dysfunction (impaired relaxation). The  average left ventricular  global longitudinal strain is -16.8 %. The global  longitudinal strain is abnormal.   2. Right ventricular systolic function is normal. The right ventricular  size is normal. There is normal pulmonary artery systolic pressure.   3. Left atrial size was mildly dilated.   4. A small pericardial effusion is present. There is no evidence of  cardiac tamponade.   5. The mitral valve is normal in structure. Trivial mitral valve  regurgitation. No evidence of mitral stenosis.   6. The aortic valve is calcified. There is severe calcifcation of the  aortic valve. Aortic valve regurgitation is mild. Severe aortic valve  stenosis. Aortic valve area, by VTI measures 0.79 cm. Aortic valve mean  gradient measures 46.0 mmHg. Aortic  valve Vmax measures 4.48 m/s.   7. The inferior vena cava is normal in size with greater than 50%  respiratory variability, suggesting right atrial pressure of 3 mmHg.   FINDINGS   Left Ventricle: Left ventricular ejection fraction, by estimation, is 55  to 60%. Left ventricular ejection fraction by 3D volume is 53 %. The left  ventricle has normal function. The left ventricle has no regional wall  motion abnormalities. The average  left ventricular global longitudinal strain is -16.8 %. Strain was  performed and the global longitudinal strain is abnormal. The left  ventricular internal cavity size was normal in size. There is mild left  ventricular hypertrophy. Left ventricular  diastolic parameters are consistent with Grade I diastolic dysfunction  (impaired relaxation).   Right Ventricle: The right ventricular size is normal. No increase in  right ventricular wall thickness. Right ventricular systolic function is  normal. There is normal pulmonary artery systolic pressure. The tricuspid  regurgitant velocity is 2.18 m/s, and   with an assumed right atrial pressure of 3 mmHg, the estimated right  ventricular systolic pressure is 22.0 mmHg.   Left Atrium: Left  atrial size was mildly dilated.   Right Atrium: Right atrial size was not assessed.   Pericardium: A small pericardial effusion is present. There is no evidence  of cardiac tamponade.  Mitral Valve: The mitral valve is normal in structure. Trivial mitral  valve regurgitation. No evidence of mitral valve stenosis.   Tricuspid Valve: The tricuspid valve is normal in structure. Tricuspid  valve regurgitation is mild.   Aortic Valve: The aortic valve is calcified. There is severe calcifcation  of the aortic valve. Aortic valve regurgitation is mild. Aortic  regurgitation PHT measures 764 msec. Severe aortic stenosis is present.  Aortic valve mean gradient measures 46.0  mmHg. Aortic valve peak gradient measures 80.3 mmHg. Aortic valve area, by  VTI measures 0.79 cm.   Pulmonic Valve: The pulmonic valve was normal in structure. Pulmonic valve  regurgitation is not visualized.   Aorta: The aortic root and ascending aorta are structurally normal, with  no evidence of dilitation.   Venous: The inferior vena cava is normal in size with greater than 50%  respiratory variability, suggesting right atrial pressure of 3 mmHg.   IAS/Shunts: No atrial level shunt detected by color flow Doppler.   Additional Comments: 3D was performed not requiring image post processing  on an independent workstation and was normal.     LEFT VENTRICLE  PLAX 2D  LVIDd:         5.00 cm         Diastology  LVIDs:         3.10 cm         LV e' medial:    5.66 cm/s  LV PW:         1.20 cm         LV E/e' medial:  17.2  LV IVS:        1.00 cm         LV e' lateral:   6.74 cm/s  LVOT diam:     2.00 cm         LV E/e' lateral: 14.5  LV SV:         72  LV SV Index:   32              2D Longitudinal  LVOT Area:     3.14 cm        Strain                                 2D Strain GLS   -17.3 %                                 (A4C):                                 2D Strain GLS   -20.2 %                                  (A3C):                                 2D Strain GLS   -12.8 %                                 (A2C):  2D Strain GLS   -16.8 %                                 Avg:                                   3D Volume EF                                 LV 3D EF:    Left                                              ventricul                                              ar                                              ejection                                              fraction                                              by 3D                                              volume is                                              53 %.                                   3D Volume EF:                                 3D EF:        53 %                                 LV EDV:       125 ml                                 LV ESV:  59 ml                                 LV SV:        67 ml   RIGHT VENTRICLE  RV Basal diam:  3.80 cm  RV S prime:     10.40 cm/s  TAPSE (M-mode): 2.5 cm  RVSP:           22.0 mmHg   LEFT ATRIUM             Index        RIGHT ATRIUM           Index  LA diam:        3.90 cm 1.72 cm/m   RA Pressure: 3.00 mmHg  LA Vol (A2C):   80.8 ml 35.62 ml/m  RA Area:     16.50 cm  LA Vol (A4C):   52.4 ml 23.10 ml/m  RA Volume:   39.60 ml  17.46 ml/m  LA Biplane Vol: 69.9 ml 30.82 ml/m   AORTIC VALVE  AV Area (Vmax):    0.66 cm  AV Area (Vmean):   0.73 cm  AV Area (VTI):     0.79 cm  AV Vmax:           448.00 cm/s  AV Vmean:          265.600 cm/s  AV VTI:            0.916 m  AV Peak Grad:      80.3 mmHg  AV Mean Grad:      46.0 mmHg  LVOT Vmax:         94.30 cm/s  LVOT Vmean:        61.300 cm/s  LVOT VTI:          0.230 m  LVOT/AV VTI ratio: 0.25  AI PHT:            764 msec    AORTA  Ao Root diam: 3.00 cm  Ao Asc diam:  3.10 cm   MITRAL VALVE               TRICUSPID VALVE  MV Area (PHT):             TR Peak grad:   19.0  mmHg  MV Decel Time:             TR Vmax:        218.00 cm/s  MV E velocity: 97.50 cm/s  Estimated RAP:  3.00 mmHg  MV A velocity: 90.10 cm/s  RVSP:           22.0 mmHg  MV E/A ratio:  1.08                             SHUNTS                             Systemic VTI:  0.23 m                             Systemic Diam: 2.00 cm   Recent Labs: No results found for requested labs within last 365 days.   Lipid Panel    Component Value Date/Time   CHOL 174 08/26/2021 0344   TRIG  21 08/26/2021 0344   HDL 62 08/26/2021 0344   CHOLHDL 2.8 08/26/2021 0344   VLDL 4 08/26/2021 0344   LDLCALC 108 (H) 08/26/2021 0344     Wt Readings from Last 3 Encounters:  01/07/24 280 lb (127 kg)  05/22/23 276 lb (125.2 kg)  11/28/22 255 lb (115.7 kg)    Assessment and Plan:   1. Severe Aortic Valve Stenosis: She has severe aortic stenosis. NYHA class 2 symptoms. She has progressive dyspnea with moderate exertion. I have personally reviewed the echo images. The aortic valve is thickened and calcified with limited leaflet mobility. I think she would benefit from AVR. She would be a candidate for surgical AVR or TAVR.    I have reviewed the natural history of aortic stenosis with the patient and their family members  who are present today. We have discussed the limitations of medical therapy and the poor prognosis associated with symptomatic aortic stenosis. We have reviewed potential treatment options, including palliative medical therapy, conventional surgical aortic valve replacement, and transcatheter aortic valve replacement. We discussed treatment options in the context of the patient's specific comorbid medical conditions.   She would like to proceed with planning for TAVR. I will arrange a right and left heart catheterization at [facility] 01/23/24. Risks and benefits of the cath procedure and the valve procedure are reviewed with the patient. After the cath, she will have a cardiac CT, CTA of the chest/abdomen and  pelvis and will then be referred to see one of the CT surgeons on our TAVR team.   In the meantime, she will see her dentist to have a full evaluation with probable dental extraction prior to her AVR.   BMET and CBC today     Labs/ tests ordered today include:  Orders Placed This Encounter  Procedures   Basic metabolic panel with GFR   CBC   EKG 12-Lead   Disposition:   F/U with the structural team post cath.   Signed, Antoinette Batman, MD, [facility] 01/07/2024 1:23 PM    [facility] [address] [place], [place], [place]  [address] Phone: [phone]; Fax: [phone]

## 2024-01-08 ENCOUNTER — Ambulatory Visit: Payer: Self-pay | Admitting: Cardiovascular Disease

## 2024-01-08 LAB — BASIC METABOLIC PANEL WITH GFR
BUN/Creatinine Ratio: 14 (ref 12–28)
BUN: 12 mg/dL (ref 8–27)
CO2: 20 mmol/L (ref 20–29)
Calcium: 10.2 mg/dL (ref 8.7–10.3)
Chloride: 97 mmol/L (ref 96–106)
Creatinine, Ser: 0.85 mg/dL (ref 0.57–1.00)
Glucose: 81 mg/dL (ref 70–99)
Potassium: 4.6 mmol/L (ref 3.5–5.2)
Sodium: 138 mmol/L (ref 134–144)
eGFR: 77 mL/min/{1.73_m2} (ref >59)

## 2024-01-08 LAB — CBC
Hematocrit: 35.6 % (ref 34.0–46.6)
Hemoglobin: 11.6 g/dL (ref 11.1–15.9)
MCH: 32 pg (ref 26.6–33.0)
MCHC: 32.6 g/dL (ref 31.5–35.7)
MCV: 98 fL — ABNORMAL HIGH (ref 79–97)
Platelets: 226 10*3/uL (ref 150–450)
RBC: 3.62 x10E6/uL — ABNORMAL LOW (ref 3.77–5.28)
RDW: 14.1 % (ref 11.7–15.4)
WBC: 7.2 10*3/uL (ref 3.4–10.8)

## 2024-01-21 ENCOUNTER — Telehealth: Payer: Self-pay | Admitting: *Deleted

## 2024-01-21 NOTE — Telephone Encounter (Addendum)
 Cardiac Catheterization scheduled at [facility] for: Wednesday January 23, 2024 10 AM Arrival time [facility] Main Entrance A at: 8 AM  Nothing to eat after midnight prior to procedure, clear liquids until 5 AM day of procedure.  Medication instructions: -Usual morning medications can be taken with sips of water including aspirin 81 mg.  Plan to go home the same day, you will only stay overnight if medically necessary.  You must have responsible adult to drive you home.  Someone must be with you the first 24 hours after you arrive home.   Left message for patient to call back to review procedure instructions

## 2024-01-21 NOTE — Telephone Encounter (Signed)
 Reviewed procedure instructions with patient.

## 2024-01-21 NOTE — Telephone Encounter (Signed)
No answer, voicemail message. 

## 2024-01-22 ENCOUNTER — Other Ambulatory Visit: Payer: Self-pay

## 2024-01-22 DIAGNOSIS — I350 Nonrheumatic aortic (valve) stenosis: Secondary | ICD-10-CM

## 2024-01-22 MED ORDER — ROSUVASTATIN CALCIUM 10 MG PO TABS: 10.0000 mg | ORAL_TABLET | Freq: Every day | ORAL | 3 refills | Status: AC

## 2024-01-22 NOTE — Telephone Encounter (Signed)
 Received message from pre-cert we do not have authorization for cath tomorrow, may take 72 hours to receive authorization.  I spoke with patient and cath has been rescheduled to January 29, 2024 with Dr Abel Hoe.  Cardiac Catheterization scheduled at [facility] for: Tuesday January 29, 2024 10:30 AM Arrival time [facility] Main Entrance A at: 8:30 AM  Nothing to eat after midnight prior to procedure, clear liquids until 5 AM day of procedure.  Medication instructions: -Usual morning medications can be taken with sips of water including aspirin 81 mg.  Plan to go home the same day, you will only stay overnight if medically necessary.  You must have responsible adult to drive you home.  Someone must be with you the first 24 hours after you arrive home.  Reviewed with patient.

## 2024-01-28 ENCOUNTER — Telehealth: Payer: Self-pay | Admitting: Cardiovascular Disease

## 2024-01-28 ENCOUNTER — Telehealth: Payer: Self-pay | Admitting: *Deleted

## 2024-01-28 NOTE — Telephone Encounter (Signed)
 Pt is calling to cx procedure for tomorrow

## 2024-01-28 NOTE — Telephone Encounter (Addendum)
 I reviewed procedure instructions with patient. Per pre-cert-authorization is on file for procedure tomorrow, pt aware.

## 2024-01-28 NOTE — Telephone Encounter (Signed)
 I spoke with patient and she has decided to go ahead with procedure tomorrow.

## 2024-01-28 NOTE — Telephone Encounter (Signed)
 Cardiac Catheterization scheduled at [facility] for: Tuesday January 29, 2024 10:30 AM Arrival time [facility] Main Entrance A at: 8:30 AM  Nothing to eat after midnight prior to procedure, clear liquids until 5 AM day of procedure  Medication instructions: -Usual morning medications can be taken with sips of water including aspirin 81 mg.  Plan to go home the same day, you will only stay overnight if medically necessary.  You must have responsible adult to drive you home.  Someone must be with you the first 24 hours after you arrive home.  Left detailed voicemail message with instructions (DPR), call if questions.

## 2024-01-29 ENCOUNTER — Encounter (HOSPITAL_COMMUNITY)
Admission: RE | Disposition: A | Discharge status: Home / Self Care | Payer: Self-pay | Source: Home / Self Care | Attending: Cardiovascular Disease

## 2024-01-29 ENCOUNTER — Ambulatory Visit (HOSPITAL_COMMUNITY)
Admission: RE | Admit: 2024-01-29 | Discharge: 2024-01-29 | Disposition: A | Discharge status: Home / Self Care | Attending: Cardiovascular Disease | Admitting: Cardiovascular Disease

## 2024-01-29 ENCOUNTER — Other Ambulatory Visit: Payer: Self-pay

## 2024-01-29 DIAGNOSIS — Z8614 Personal history of Methicillin resistant Staphylococcus aureus infection: Secondary | ICD-10-CM | POA: Insufficient documentation

## 2024-01-29 DIAGNOSIS — Z6841 Body Mass Index (BMI) 40.0 and over, adult: Secondary | ICD-10-CM | POA: Diagnosis not present

## 2024-01-29 DIAGNOSIS — Z8674 Personal history of sudden cardiac arrest: Secondary | ICD-10-CM | POA: Diagnosis not present

## 2024-01-29 DIAGNOSIS — I10 Essential (primary) hypertension: Secondary | ICD-10-CM | POA: Insufficient documentation

## 2024-01-29 DIAGNOSIS — Z79899 Other long term (current) drug therapy: Secondary | ICD-10-CM | POA: Insufficient documentation

## 2024-01-29 DIAGNOSIS — J45909 Unspecified asthma, uncomplicated: Secondary | ICD-10-CM | POA: Insufficient documentation

## 2024-01-29 DIAGNOSIS — I350 Nonrheumatic aortic (valve) stenosis: Secondary | ICD-10-CM | POA: Diagnosis not present

## 2024-01-29 DIAGNOSIS — F419 Anxiety disorder, unspecified: Secondary | ICD-10-CM | POA: Diagnosis not present

## 2024-01-29 HISTORY — PX: CORONARY ANGIOGRAPHY: CATH118303

## 2024-01-29 SURGERY — CORONARY ANGIOGRAPHY (CATH LAB)
Anesthesia: LOCAL

## 2024-01-29 MED ORDER — HYDRALAZINE HCL 20 MG/ML IJ SOLN: 10.0000 mg | INTRAMUSCULAR | Status: DC | PRN

## 2024-01-29 MED ORDER — LIDOCAINE HCL (PF) 1 % IJ SOLN
INTRAMUSCULAR | Status: DC | PRN
  Administered 2024-01-29: 5 mL

## 2024-01-29 MED ORDER — HEPARIN SODIUM (PORCINE) 1000 UNIT/ML IJ SOLN
INTRAMUSCULAR | Status: AC
  Filled 2024-01-29: qty 10

## 2024-01-29 MED ORDER — SODIUM CHLORIDE 0.9 % IV SOLN: INTRAVENOUS | Status: AC

## 2024-01-29 MED ORDER — ACETAMINOPHEN 325 MG PO TABS
650.0000 mg | ORAL_TABLET | ORAL | Status: DC | PRN
Start: 2024-01-29 — End: 2024-01-29

## 2024-01-29 MED ORDER — SODIUM CHLORIDE 0.9% FLUSH: 3.0000 mL | Freq: Two times a day (BID) | INTRAVENOUS | Status: DC

## 2024-01-29 MED ORDER — MIDAZOLAM HCL 2 MG/2ML IJ SOLN
INTRAMUSCULAR | Status: AC
  Filled 2024-01-29: qty 2

## 2024-01-29 MED ORDER — SODIUM CHLORIDE 0.9 % IV SOLN: 250.0000 mL | INTRAVENOUS | Status: DC | PRN

## 2024-01-29 MED ORDER — VERAPAMIL HCL 2.5 MG/ML IV SOLN
INTRAVENOUS | Status: AC
  Filled 2024-01-29: qty 2

## 2024-01-29 MED ORDER — LIDOCAINE HCL (PF) 1 % IJ SOLN
INTRAMUSCULAR | Status: AC
  Filled 2024-01-29: qty 30

## 2024-01-29 MED ORDER — MIDAZOLAM HCL 2 MG/2ML IJ SOLN
INTRAMUSCULAR | Status: DC | PRN
Start: 2024-01-29 — End: 2024-01-29
  Administered 2024-01-29: 2 mg via INTRAVENOUS

## 2024-01-29 MED ORDER — SODIUM CHLORIDE 0.9% FLUSH: 3.0000 mL | INTRAVENOUS | Status: DC | PRN

## 2024-01-29 MED ORDER — LABETALOL HCL 5 MG/ML IV SOLN: 10.0000 mg | INTRAVENOUS | Status: DC | PRN

## 2024-01-29 MED ORDER — FENTANYL CITRATE (PF) 100 MCG/2ML IJ SOLN
INTRAMUSCULAR | Status: DC | PRN
  Administered 2024-01-29: 50 ug via INTRAVENOUS

## 2024-01-29 MED ORDER — ASPIRIN 81 MG PO CHEW: 81.0000 mg | CHEWABLE_TABLET | ORAL | Status: DC

## 2024-01-29 MED ORDER — FENTANYL CITRATE (PF) 100 MCG/2ML IJ SOLN
INTRAMUSCULAR | Status: AC
  Filled 2024-01-29: qty 2

## 2024-01-29 MED ORDER — HEPARIN SODIUM (PORCINE) 1000 UNIT/ML IJ SOLN
INTRAMUSCULAR | Status: DC | PRN
  Administered 2024-01-29: 7000 [IU] via INTRAVENOUS

## 2024-01-29 MED ORDER — HEPARIN (PORCINE) IN NACL 1000-0.9 UT/500ML-% IV SOLN
INTRAVENOUS | Status: DC | PRN
  Administered 2024-01-29 (×2): 500 mL via INTRA_ARTERIAL

## 2024-01-29 MED ORDER — SODIUM CHLORIDE 0.9 % WEIGHT BASED INFUSION: 1.0000 mL/kg/h | INTRAVENOUS | Status: DC

## 2024-01-29 MED ORDER — VERAPAMIL HCL 2.5 MG/ML IV SOLN
INTRAVENOUS | Status: DC | PRN
  Administered 2024-01-29: 10 mL via INTRA_ARTERIAL

## 2024-01-29 MED ORDER — IOHEXOL 350 MG/ML SOLN
INTRAVENOUS | Status: DC | PRN
  Administered 2024-01-29: 30 mL via INTRA_ARTERIAL

## 2024-01-29 MED ORDER — SODIUM CHLORIDE 0.9 % WEIGHT BASED INFUSION: 3.0000 mL/kg/h | INTRAVENOUS | Status: AC

## 2024-01-29 SURGICAL SUPPLY — 8 items
CATH 5FR JL3.5 JR4 ANG PIG MP (CATHETERS) IMPLANT
COVER PRB 48X5XTLSCP FOLD TPE (BAG) IMPLANT
DEVICE RAD COMP TR BAND LRG (VASCULAR PRODUCTS) IMPLANT
GLIDESHEATH SLEND SS 6F .021 (SHEATH) IMPLANT
GUIDEWIRE INQWIRE 1.5J.035X260 (WIRE) IMPLANT
PACK CARDIAC CATHETERIZATION (CUSTOM PROCEDURE TRAY) ×2 IMPLANT
SET ATX-X65L (MISCELLANEOUS) IMPLANT
SHEATH GLIDE SLENDER 4/5FR (SHEATH) IMPLANT

## 2024-01-29 NOTE — Discharge Instructions (Signed)

## 2024-01-29 NOTE — Interval H&P Note (Signed)
 History and Physical Interval Note:  01/29/2024 10:25 AM  Nicole Hines  has presented today for surgery, with the diagnosis of severe aortic stenosis.  The various methods of treatment have been discussed with the patient and family. After consideration of risks, benefits and other options for treatment, the patient has consented to  Procedure(s): RIGHT/LEFT HEART CATH AND CORONARY ANGIOGRAPHY (N/A) as a surgical intervention.  The patient's history has been reviewed, patient examined, no change in status, stable for surgery.  I have reviewed the patient's chart and labs.  Questions were answered to the patient's satisfaction.    Cath Lab Visit (complete for each Cath Lab visit)  Clinical Evaluation Leading to the Procedure:   ACS: No.  Non-ACS:    Anginal Classification: No Symptoms  Anti-ischemic medical therapy: No Therapy  Non-Invasive Test Results: No non-invasive testing performed  Prior CABG: No previous CABG        Nicole Hines

## 2024-01-30 ENCOUNTER — Encounter (HOSPITAL_COMMUNITY): Payer: Self-pay | Admitting: Cardiovascular Disease

## 2024-02-14 ENCOUNTER — Ambulatory Visit (HOSPITAL_COMMUNITY)
Admit: 2024-02-14 | Discharge: 2024-02-14 | Disposition: A | Discharge status: Home / Self Care | Source: Ambulatory Visit | Attending: Internal Medicine | Admitting: Internal Medicine

## 2024-02-14 DIAGNOSIS — Z01818 Encounter for other preprocedural examination: Secondary | ICD-10-CM | POA: Diagnosis not present

## 2024-02-14 DIAGNOSIS — I350 Nonrheumatic aortic (valve) stenosis: Secondary | ICD-10-CM | POA: Insufficient documentation

## 2024-02-14 MED ORDER — IOHEXOL 350 MG/ML SOLN
100.0000 mL | Freq: Once | INTRAVENOUS | Status: AC | PRN
  Administered 2024-02-14: 100 mL via INTRAVENOUS

## 2024-02-15 ENCOUNTER — Other Ambulatory Visit (HOSPITAL_COMMUNITY): Payer: Self-pay | Admitting: Cardiovascular Disease

## 2024-02-15 DIAGNOSIS — I350 Nonrheumatic aortic (valve) stenosis: Secondary | ICD-10-CM

## 2024-02-18 ENCOUNTER — Other Ambulatory Visit: Payer: Self-pay | Admitting: Physician Assistant

## 2024-02-18 ENCOUNTER — Ambulatory Visit: Payer: Self-pay | Admitting: Cardiovascular Disease

## 2024-02-18 DIAGNOSIS — I350 Nonrheumatic aortic (valve) stenosis: Secondary | ICD-10-CM

## 2024-02-21 ENCOUNTER — Ambulatory Visit (HOSPITAL_COMMUNITY)
Admission: RE | Admit: 2024-02-21 | Discharge: 2024-02-21 | Disposition: A | Discharge status: Home / Self Care | Payer: Self-pay | Source: Ambulatory Visit | Attending: Physician Assistant | Admitting: Physician Assistant

## 2024-02-21 DIAGNOSIS — I350 Nonrheumatic aortic (valve) stenosis: Secondary | ICD-10-CM

## 2024-02-21 MED ORDER — IOHEXOL 350 MG/ML SOLN
100.0000 mL | Freq: Once | INTRAVENOUS | Status: AC | PRN
  Administered 2024-02-21: 100 mL via INTRAVENOUS

## 2024-02-29 ENCOUNTER — Ambulatory Visit: Admitting: Thoracic Surgery (Cardiothoracic Vascular Surgery)

## 2024-03-19 ENCOUNTER — Other Ambulatory Visit: Payer: Self-pay

## 2024-03-19 ENCOUNTER — Inpatient Hospital Stay (HOSPITAL_COMMUNITY)
Admission: EM | Admit: 2024-03-19 | Discharge: 2024-03-22 | DRG: 603 | Disposition: A | Discharge status: Home / Self Care | Attending: Family Medicine | Admitting: Family Medicine

## 2024-03-19 ENCOUNTER — Emergency Department (HOSPITAL_COMMUNITY)

## 2024-03-19 ENCOUNTER — Encounter (HOSPITAL_COMMUNITY): Payer: Self-pay | Admitting: Emergency Medicine

## 2024-03-19 DIAGNOSIS — Z716 Tobacco abuse counseling: Secondary | ICD-10-CM

## 2024-03-19 DIAGNOSIS — Z9049 Acquired absence of other specified parts of digestive tract: Secondary | ICD-10-CM

## 2024-03-19 DIAGNOSIS — Z79899 Other long term (current) drug therapy: Secondary | ICD-10-CM

## 2024-03-19 DIAGNOSIS — G629 Polyneuropathy, unspecified: Secondary | ICD-10-CM | POA: Diagnosis present

## 2024-03-19 DIAGNOSIS — L03113 Cellulitis of right upper limb: Secondary | ICD-10-CM | POA: Diagnosis not present

## 2024-03-19 DIAGNOSIS — L02413 Cutaneous abscess of right upper limb: Secondary | ICD-10-CM | POA: Diagnosis present

## 2024-03-19 DIAGNOSIS — B9562 Methicillin resistant Staphylococcus aureus infection as the cause of diseases classified elsewhere: Secondary | ICD-10-CM | POA: Diagnosis present

## 2024-03-19 DIAGNOSIS — G8929 Other chronic pain: Secondary | ICD-10-CM | POA: Diagnosis present

## 2024-03-19 DIAGNOSIS — Z8674 Personal history of sudden cardiac arrest: Secondary | ICD-10-CM

## 2024-03-19 DIAGNOSIS — L03119 Cellulitis of unspecified part of limb: Secondary | ICD-10-CM | POA: Diagnosis not present

## 2024-03-19 DIAGNOSIS — Z8249 Family history of ischemic heart disease and other diseases of the circulatory system: Secondary | ICD-10-CM

## 2024-03-19 DIAGNOSIS — F39 Unspecified mood [affective] disorder: Secondary | ICD-10-CM | POA: Diagnosis present

## 2024-03-19 DIAGNOSIS — Z833 Family history of diabetes mellitus: Secondary | ICD-10-CM

## 2024-03-19 DIAGNOSIS — E66813 Obesity, class 3: Secondary | ICD-10-CM | POA: Diagnosis present

## 2024-03-19 DIAGNOSIS — L0390 Cellulitis, unspecified: Principal | ICD-10-CM

## 2024-03-19 DIAGNOSIS — Z6841 Body Mass Index (BMI) 40.0 and over, adult: Secondary | ICD-10-CM

## 2024-03-19 DIAGNOSIS — E785 Hyperlipidemia, unspecified: Secondary | ICD-10-CM | POA: Diagnosis present

## 2024-03-19 DIAGNOSIS — J45909 Unspecified asthma, uncomplicated: Secondary | ICD-10-CM | POA: Diagnosis present

## 2024-03-19 DIAGNOSIS — I10 Essential (primary) hypertension: Secondary | ICD-10-CM | POA: Diagnosis present

## 2024-03-19 DIAGNOSIS — F419 Anxiety disorder, unspecified: Secondary | ICD-10-CM | POA: Diagnosis present

## 2024-03-19 DIAGNOSIS — Z22322 Carrier or suspected carrier of Methicillin resistant Staphylococcus aureus: Secondary | ICD-10-CM

## 2024-03-19 LAB — COMPREHENSIVE METABOLIC PANEL WITH GFR
ALT: 34 U/L (ref 0–44)
AST: 40 U/L (ref 15–41)
Albumin: 3.6 g/dL (ref 3.5–5.0)
Alkaline Phosphatase: 70 U/L (ref 38–126)
Anion gap: 10 (ref 5–15)
BUN: 12 mg/dL (ref 8–23)
CO2: 26 mmol/L (ref 22–32)
Calcium: 9.4 mg/dL (ref 8.9–10.3)
Chloride: 98 mmol/L (ref 98–111)
Creatinine, Ser: 0.77 mg/dL (ref 0.44–1.00)
GFR, Estimated: >60 mL/min (ref >60)
Glucose, Bld: 116 mg/dL — ABNORMAL HIGH (ref 70–99)
Potassium: 3.5 mmol/L (ref 3.5–5.1)
Sodium: 134 mmol/L — ABNORMAL LOW (ref 135–145)
Total Bilirubin: 0.8 mg/dL (ref 0.0–1.2)
Total Protein: 8.2 g/dL — ABNORMAL HIGH (ref 6.5–8.1)

## 2024-03-19 LAB — URINALYSIS, W/ REFLEX TO CULTURE (INFECTION SUSPECTED)
Bilirubin Urine: NEGATIVE
Glucose, UA: NEGATIVE mg/dL
Hgb urine dipstick: NEGATIVE
Ketones, ur: NEGATIVE mg/dL
Nitrite: NEGATIVE
Protein, ur: 30 mg/dL — AB
Specific Gravity, Urine: 1.023 (ref 1.005–1.030)
pH: 5 (ref 5.0–8.0)

## 2024-03-19 LAB — CBC WITH DIFFERENTIAL/PLATELET
Abs Immature Granulocytes: 0.11 K/uL — ABNORMAL HIGH (ref 0.00–0.07)
Basophils Absolute: 0 K/uL (ref 0.0–0.1)
Basophils Relative: 0 %
Eosinophils Absolute: 0.1 K/uL (ref 0.0–0.5)
Eosinophils Relative: 1 %
HCT: 33.9 % — ABNORMAL LOW (ref 36.0–46.0)
Hemoglobin: 10.6 g/dL — ABNORMAL LOW (ref 12.0–15.0)
Immature Granulocytes: 1 %
Lymphocytes Relative: 12 %
Lymphs Abs: 1.1 K/uL (ref 0.7–4.0)
MCH: 31.3 pg (ref 26.0–34.0)
MCHC: 31.3 g/dL (ref 30.0–36.0)
MCV: 100 fL (ref 80.0–100.0)
Monocytes Absolute: 0.7 K/uL (ref 0.1–1.0)
Monocytes Relative: 8 %
Neutro Abs: 6.9 K/uL (ref 1.7–7.7)
Neutrophils Relative %: 78 %
Platelets: 200 K/uL (ref 150–400)
RBC: 3.39 MIL/uL — ABNORMAL LOW (ref 3.87–5.11)
RDW: 13 % (ref 11.5–15.5)
WBC: 8.9 K/uL (ref 4.0–10.5)
nRBC: 0 % (ref 0.0–0.2)

## 2024-03-19 LAB — SEDIMENTATION RATE: Sed Rate: 70 mm/h — ABNORMAL HIGH (ref 0–22)

## 2024-03-19 LAB — I-STAT CG4 LACTIC ACID, ED: Lactic Acid, Venous: 1 mmol/L (ref 0.5–1.9)

## 2024-03-19 MED ORDER — GABAPENTIN 300 MG PO CAPS
300.0000 mg | ORAL_CAPSULE | Freq: Every day | ORAL | Status: DC
  Administered 2024-03-19 – 2024-03-21 (×3): 300 mg via ORAL
  Filled 2024-03-19 (×3): qty 1

## 2024-03-19 MED ORDER — TIZANIDINE HCL 4 MG PO TABS
4.0000 mg | ORAL_TABLET | Freq: Three times a day (TID) | ORAL | Status: DC | PRN
  Administered 2024-03-20 – 2024-03-22 (×5): 4 mg via ORAL
  Filled 2024-03-19 (×5): qty 1

## 2024-03-19 MED ORDER — ENOXAPARIN SODIUM 60 MG/0.6ML IJ SOSY
60.0000 mg | PREFILLED_SYRINGE | INTRAMUSCULAR | Status: DC
  Administered 2024-03-20 – 2024-03-22 (×3): 60 mg via SUBCUTANEOUS
  Filled 2024-03-19 (×3): qty 0.6

## 2024-03-19 MED ORDER — VANCOMYCIN HCL IN DEXTROSE 1-5 GM/200ML-% IV SOLN
1000.0000 mg | Freq: Once | INTRAVENOUS | Status: AC
  Administered 2024-03-19: 1000 mg via INTRAVENOUS
  Filled 2024-03-19: qty 200

## 2024-03-19 MED ORDER — ROSUVASTATIN CALCIUM 10 MG PO TABS
10.0000 mg | ORAL_TABLET | Freq: Every day | ORAL | Status: DC
  Administered 2024-03-20 – 2024-03-22 (×3): 10 mg via ORAL
  Filled 2024-03-19 (×3): qty 1

## 2024-03-19 MED ORDER — MONTELUKAST SODIUM 10 MG PO TABS
10.0000 mg | ORAL_TABLET | Freq: Every morning | ORAL | Status: DC
  Administered 2024-03-20 – 2024-03-22 (×3): 10 mg via ORAL
  Filled 2024-03-19 (×3): qty 1

## 2024-03-19 MED ORDER — OXYCODONE-ACETAMINOPHEN 5-325 MG PO TABS
1.0000 | ORAL_TABLET | Freq: Four times a day (QID) | ORAL | Status: DC | PRN
  Administered 2024-03-20 – 2024-03-22 (×7): 1 via ORAL
  Filled 2024-03-19 (×8): qty 1

## 2024-03-19 MED ORDER — OXYCODONE-ACETAMINOPHEN 10-325 MG PO TABS: 1.0000 | ORAL_TABLET | Freq: Four times a day (QID) | ORAL | Status: DC | PRN

## 2024-03-19 MED ORDER — ALPRAZOLAM 0.5 MG PO TABS: 1.0000 mg | ORAL_TABLET | Freq: Every evening | ORAL | Status: DC | PRN

## 2024-03-19 MED ORDER — CITALOPRAM HYDROBROMIDE 20 MG PO TABS
40.0000 mg | ORAL_TABLET | Freq: Every morning | ORAL | Status: DC
  Administered 2024-03-20 – 2024-03-22 (×3): 40 mg via ORAL
  Filled 2024-03-19 (×3): qty 2

## 2024-03-19 MED ORDER — ONDANSETRON HCL 4 MG/2ML IJ SOLN: 4.0000 mg | Freq: Four times a day (QID) | INTRAMUSCULAR | Status: DC | PRN

## 2024-03-19 MED ORDER — VANCOMYCIN HCL 2000 MG/400ML IV SOLN: 2000.0000 mg | INTRAVENOUS | Status: DC

## 2024-03-19 MED ORDER — VANCOMYCIN HCL 1500 MG/300ML IV SOLN
1500.0000 mg | INTRAVENOUS | Status: AC
  Administered 2024-03-20: 1500 mg via INTRAVENOUS
  Filled 2024-03-19: qty 300

## 2024-03-19 MED ORDER — PANTOPRAZOLE SODIUM 40 MG PO TBEC
40.0000 mg | DELAYED_RELEASE_TABLET | Freq: Every day | ORAL | Status: DC
  Administered 2024-03-20 – 2024-03-22 (×3): 40 mg via ORAL
  Filled 2024-03-19 (×3): qty 1

## 2024-03-19 MED ORDER — ALBUTEROL SULFATE (2.5 MG/3ML) 0.083% IN NEBU: 2.5000 mg | INHALATION_SOLUTION | RESPIRATORY_TRACT | Status: DC | PRN

## 2024-03-19 MED ORDER — IRBESARTAN 150 MG PO TABS
150.0000 mg | ORAL_TABLET | Freq: Every day | ORAL | Status: DC
  Administered 2024-03-20 – 2024-03-22 (×3): 150 mg via ORAL
  Filled 2024-03-19 (×3): qty 1

## 2024-03-19 MED ORDER — ACETAMINOPHEN 500 MG PO TABS
1000.0000 mg | ORAL_TABLET | Freq: Four times a day (QID) | ORAL | Status: DC | PRN
  Administered 2024-03-21: 1000 mg via ORAL
  Filled 2024-03-19: qty 2

## 2024-03-19 MED ORDER — SODIUM CHLORIDE 0.9 % IV SOLN
1.0000 g | Freq: Every day | INTRAVENOUS | Status: DC
  Administered 2024-03-19 – 2024-03-20 (×2): 1 g via INTRAVENOUS
  Filled 2024-03-19 (×2): qty 10

## 2024-03-19 MED ORDER — POLYETHYLENE GLYCOL 3350 17 G PO PACK: 17.0000 g | PACK | Freq: Every day | ORAL | Status: DC | PRN

## 2024-03-19 MED ORDER — OXYCODONE-ACETAMINOPHEN 5-325 MG PO TABS: 1.0000 | ORAL_TABLET | Freq: Once | ORAL | Status: DC

## 2024-03-19 MED ORDER — OXYCODONE HCL 5 MG PO TABS
5.0000 mg | ORAL_TABLET | Freq: Four times a day (QID) | ORAL | Status: DC | PRN
  Administered 2024-03-20 – 2024-03-22 (×7): 5 mg via ORAL
  Filled 2024-03-19 (×8): qty 1

## 2024-03-19 MED ORDER — MELATONIN 3 MG PO TABS: 6.0000 mg | ORAL_TABLET | Freq: Every evening | ORAL | Status: DC | PRN

## 2024-03-19 NOTE — ED Triage Notes (Signed)
 Patient report worsening right arm swelling and redness. Patient report she was prescribe antibiotcs and cream but with no relief. Patient report nausea denies vomiting. Patient denies fever.

## 2024-03-19 NOTE — ED Provider Triage Note (Signed)
 Emergency Medicine Provider Triage Evaluation Note  Rhian Asebedo Aten , a 63 y.o. female  was evaluated in triage.  Pt complains of R arm swelling. Patient was by PCP and had I+D performed of R arm on Monday. Was given a shot of antibiotics and was placed on doxycycline  outpatient. Was also given a cream. Has hx of MRSA, hospitalized for previous infections. Denies fever, but is unsure of chills. States that all she wants to do is sleep and that she is achy.    Review of Systems  Positive: R arm swelling, fatigue, lethargy Negative: Fever, chest pain, shortness of breath, nausea, vomiting   Physical Exam  BP 97/68 (BP Location: Left Arm)   Pulse 76   Temp 98.9 F (37.2 C) (Oral)   Resp 16   LMP 02/18/2014   SpO2 93%  Gen:   Awake, no distress   Resp:  Normal effort  MSK:   Moves extremities without difficulty, R forearm red and swollen, previous I + D site with purulent drainage.  Other:  Alert and oriented, talking in full sentences, no AMS, vital signs stable  Medical Decision Making  Medically screening exam initiated at 3:23 PM.  Appropriate orders placed.  Alvita Fana Frogge was informed that the remainder of the evaluation will be completed by another provider, this initial triage assessment does not replace that evaluation, and the importance of remaining in the ED until their evaluation is complete.  Orders: CBC, CMP, ESR, CRP, R forearm xray, blood cultures, lactic acid    Marcos Peloso F, [place] 03/19/24 1530

## 2024-03-19 NOTE — H&P (Addendum)
 History and Physical    Nicole Hines FMW:[id number] DOB: 06-26-1961 DOA: 03/19/2024  PCP: Onita Rush, MD   Patient coming from: Home   Chief Complaint:  Chief Complaint  Patient presents with   Arm Swelling    HPI:  Nicole Hines is a 63 y.o. 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  Reports that symptoms started approximately 1 week ago with an area of a scab on the dorsal aspect of her right forearm.  And spreading redness and swelling from that site.  Was seen by her primary care physician on Monday 7/28 and underwent an I&D of right dorsal forearm abscess.  Per patient no culture was taken.  She was started on doxycycline  100 mg twice daily which she is taking as prescribed.  However despite taking this the redness and swelling has worsened.  She does have pain with movement of the right elbow joint.  No measured fevers.  No chills.  Denies any other recent illness   Review of Systems:  ROS complete and negative except as marked above   No Known Allergies  Prior to Admission medications   Medication Sig Start Date End Date Taking? Authorizing Provider  albuterol  (VENTOLIN  HFA) 108 (90 Base) MCG/ACT inhaler Inhale 2 puffs into the lungs every 4 (four) hours as needed for shortness of breath or wheezing.    [provider]  ALPRAZolam  (XANAX ) 0.5 MG tablet Take 0.5-1 mg by mouth at bedtime. For anxiety    [provider]  ascorbic acid (VITAMIN C) 500 MG tablet Take 500 mg by mouth daily.    [provider]  citalopram  (CELEXA ) 40 MG tablet Take 40 mg by mouth every morning.    [provider]  clobetasol cream (TEMOVATE) 0.05 % Apply 1 application  topically as needed. Around lips 10/18/20   [provider]  diphenhydrAMINE (BENADRYL) 50 MG tablet Take 50 mg by mouth at bedtime.    [provider]  fish oil-omega-3 fatty acids 1000 MG capsule Take 1 g by mouth 2 (two) times daily.    [provider]  gabapentin  (NEURONTIN ) 100 MG capsule Take 300 mg by mouth at bedtime. 11/11/20   [provider]  Magnesium Oxide -Mg Supplement ([employer] MAGNESIUM) 500 MG TABS Take 500 mg by mouth at bedtime.    [provider]  montelukast  (SINGULAIR ) 10 MG tablet Take 10 mg by mouth every morning.  10/19/13   [provider]  Multiple Vitamin (MULTIVITAMIN WITH MINERALS) TABS tablet Take 1 tablet by mouth daily.    [provider]  omeprazole  (PRILOSEC) 20 MG capsule Take 1 capsule (20 mg total) by mouth daily before breakfast. 12/30/20   Amin, Ankit C, MD  oxyCODONE -acetaminophen  (PERCOCET ) 10-325 MG per tablet Take 1 tablet by mouth every 6 (six) hours as needed for pain.  10/25/13   [provider]  polyethylene glycol (MIRALAX  / GLYCOLAX ) 17 g packet Take 17 g by mouth daily as needed for severe constipation. 12/30/20   Amin, Ankit C, MD  rosuvastatin  (CRESTOR ) 10 MG tablet Take 1 tablet (10 mg total) by mouth daily. 01/22/24   Verlin Lonni BIRCH, MD  telmisartan (MICARDIS) 40 MG tablet Take 40 mg by mouth daily. 01/31/21   [provider]  tiZANidine  (ZANAFLEX )  4 MG tablet Take 4 mg by mouth at bedtime. 11/30/23   [provider]  vitamin B-12 (CYANOCOBALAMIN) 500 MCG tablet Take 500 mcg by mouth daily.    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 abdomen   TEE WITHOUT CARDIOVERSION N/A 12/27/2020   Procedure: TRANSESOPHAGEAL ECHOCARDIOGRAM (TEE);  Surgeon: Hobart Powell BRAVO, MD;  Location: [facility] ENDOSCOPY;  Service: Cardiovascular;  Laterality: N/A;     reports that she has never smoked. She has never used smokeless tobacco. She reports that she does not drink alcohol and does not use drugs.  Family History  Problem Relation Age of Onset   Cancer Mother        pancreatic   Diabetes Mother    Hypertension Mother    Heart attack Father 15   Diabetes Brother    Heart attack Maternal Grandmother    Cancer Maternal Aunt        uterine   Diabetes Maternal Aunt    Colon cancer Neg Hx      Physical Exam: Vitals:   03/19/24 1822 03/19/24 2115 03/19/24 2130 03/19/24 2215  BP: (!) 152/95  128/73   Pulse: 67 63 63   Resp: 16  16   Temp: 98.1 F (36.7 C)   98 F (36.7 C)  TempSrc: Oral     SpO2: 100% 91% 95%     Gen: Awake, alert, NAD   CV: Regular, normal S1, S2, 3/6 crescendo decrescendo murmur at RUSB Resp: Normal WOB, CTAB  Abd: Flat, normoactive, nontender MSK: No apparent effusion of the right elbow joint, landmarks are nontender and range of motion with only slight pain. Skin: On the right forearm there is large area of erythema and induration, centered around the area prior I&D which has a central scab without drainage, no surrounding fluctuance.  Erythema  extends up above the elbow Neuro: Alert and interactive  Psych: euthymic, appropriate    Data review:   Labs reviewed, notable for:   WBC 8.9 ESR 78 UA with rare bacteria, 21-50 WBC, small leukocyte, negative nitrite  Blood cultures pending  Micro:  Results for orders placed or performed during the hospital encounter of 03/19/24  Blood culture (routine x 2)     Status: None (Preliminary result)   Collection Time: 03/19/24  4:02 PM   Specimen: BLOOD LEFT WRIST  Result Value Ref Range Status   Specimen Description   Final    BLOOD  LEFT WRIST Performed at [facility], [address], [place], [place] [address]    Special Requests   Final    BOTTLES DRAWN AEROBIC ONLY Blood Culture results may not be optimal due to an inadequate volume of blood received in culture bottles Performed at [facility], [address], [place], [place] [address]    Culture PENDING  Incomplete   Report Status PENDING  Incomplete    Imaging reviewed:  DG Forearm Right Result Date: 03/19/2024 CLINICAL DATA:  Swelling and redness. EXAM: RIGHT FOREARM - 2 VIEW COMPARISON:  None Available. FINDINGS: No fracture or dislocation. Preserved joint spaces and bone mineralization. Soft tissue swelling. No definite erosive changes. If there is further concern of bone or soft tissue infection, additional cross-sectional study could be considered as clinically appropriate for further sensitivity. IMPRESSION: Soft tissue swelling. Electronically Signed   By: Ranell Bring M.D.   On: 03/19/2024 16:04    ED Course:  Treated with vancomycin    Assessment/Plan:  63 y.o. female with hx Severe AS undergoing evaluation for TAVR, hx of prior PEA arrest, HTN, Morbid obesity, MRSA bacteremia c/b left SI septic arthritis / surrounding ostomyelitis now off antibiotics, who presents with cellulitis and abscess of RUE s/p I+D at PCP on 7/28, and progressive cellulitis despite antibiotics with doxycycline .    Cellulitis with abscess, RUE  S/p I+D at PCP on 7/28 (no culture per patient)  -- Continue vancomycin , pharmacy to dose - Follow-up blood cultures, no signs of sepsis at this point - Does have elevated inflammatory markers with ESR 70, CRP pending. XR forearm without change of osteomyelitis (did include elbow). Consider MRI of Elbow if no clinical improvement to r/o septic joint   Question UTI UA suggestive but patient denies urinary changes - Started on ceftriaxone , follow-up urine culture  Chronic medical problems:  Severe AS undergoing evaluation for TAVR: Noted, outpatient cardiology f/u  hx of prior PEA arrest '23: Noted  HTN: Continue home telmisartan morbid obesity: Would benefit from weight loss outpatient Hx MRSA bacteremia '22 c/b left SI septic arthritis / surrounding ostomyelitis: Followed by Dr. Fleeta Rothman now off antibiotics Asthma: Continue Singulair , albuterol . ? Neuropathy / chronic pain: Continue gabapentin  at bedtime Mood d/o: Continue Xanax  as needed, self telegram,  There is no height or weight on file to calculate BMI.    DVT prophylaxis:  Lovenox  Code Status:  Full Code Diet:  Diet Orders (From admission, onward)     Start     Ordered   03/19/24 2218  Diet Heart Room service appropriate? Yes; Fluid consistency: Thin  Diet effective now       Question Answer Comment  Room service appropriate? Yes   Fluid consistency: Thin      03/19/24 2217           Family Communication:  None   Consults:  None   Admission status:   Observation, Med-Surg  Severity of Illness: The appropriate patient status for this patient is OBSERVATION. Observation status is judged to be reasonable and necessary in order to provide the required intensity of service to ensure the patient's safety. The patient's presenting symptoms, physical exam findings, and initial radiographic and laboratory data in the context of their medical condition is felt to place them at decreased risk  for further clinical deterioration. Furthermore, it is anticipated that the patient will be medically stable for discharge from the hospital within 2 midnights of admission.    Dorn Dawson, MD [facility]  How to contact the [facility] Attending or Consulting provider 7A - 7P or covering provider during after hours 7P -7A, for this patient.  Check the care team in [facility] and look for a) attending/consulting [facility] provider listed and b) the [facility] team listed Log into [email or web address] and use [place]'s universal password to access. If you do not have the password, please contact the hospital operator. Locate the [facility] provider you are looking for under [facility] and page to a number that you can be directly reached. If you still have difficulty reaching the provider, please page the [facility] (Director on Call) for the Hospitalists listed on amion for assistance.  03/19/2024, 10:34 PM

## 2024-03-19 NOTE — ED Provider Notes (Signed)
 [place] EMERGENCY DEPARTMENT AT [facility] Provider Note   CSN: [id number] Arrival date & time: 03/19/24  1411     Patient presents with: Arm Swelling   Nicole Hines is a 63 y.o. female.   63 year old female with past medical history of MRSA in the past as well as hyperlipidemia presenting to the emergency department today with redness and swelling of her right upper extremity.  The patient saw her primary care provider last week.  She did have a small abscess drained at that time and was started on doxycycline  as well as topical antibiotics.  She reports that she has been on these since Friday.  She states that the redness has gotten worse and is starting to move up her arm.  She also reports significant arm swelling with this.  She had to the emergency department today due to the symptoms.  She denies any fevers but has had some chills at home.        Prior to Admission medications   Medication Sig Start Date End Date Taking? Authorizing Provider  albuterol  (VENTOLIN  HFA) 108 (90 Base) MCG/ACT inhaler Inhale 2 puffs into the lungs every 4 (four) hours as needed for shortness of breath or wheezing.    [provider]  ALPRAZolam  (XANAX ) 0.5 MG tablet Take 0.5-1 mg by mouth at bedtime. For anxiety    [provider]  ascorbic acid (VITAMIN C) 500 MG tablet Take 500 mg by mouth daily.    [provider]  citalopram  (CELEXA ) 40 MG tablet Take 40 mg by mouth every morning.    [provider]  clobetasol cream (TEMOVATE) 0.05 % Apply 1 application  topically as needed. Around lips 10/18/20   [provider]  diphenhydrAMINE (BENADRYL) 50 MG tablet Take 50 mg by mouth at bedtime.    [provider]  fish oil-omega-3 fatty acids 1000 MG capsule Take 1 g by mouth 2 (two) times daily.    [provider]  gabapentin  (NEURONTIN ) 100 MG capsule Take 300 mg by mouth at bedtime. 11/11/20   [provider]   Magnesium Oxide -Mg Supplement ([employer] MAGNESIUM) 500 MG TABS Take 500 mg by mouth at bedtime.    [provider]  montelukast  (SINGULAIR ) 10 MG tablet Take 10 mg by mouth every morning.  10/19/13   [provider]  Multiple Vitamin (MULTIVITAMIN WITH MINERALS) TABS tablet Take 1 tablet by mouth daily.    [provider]  omeprazole  (PRILOSEC) 20 MG capsule Take 1 capsule (20 mg total) by mouth daily before breakfast. 12/30/20   Amin, Ankit C, MD  oxyCODONE -acetaminophen  (PERCOCET ) 10-325 MG per tablet Take 1 tablet by mouth every 6 (six) hours as needed for pain.  10/25/13   [provider]  polyethylene glycol (MIRALAX  / GLYCOLAX ) 17 g packet Take 17 g by mouth daily as needed for severe constipation. 12/30/20   Amin, Ankit C, MD  rosuvastatin  (CRESTOR ) 10 MG tablet Take 1 tablet (10 mg total) by mouth daily. 01/22/24   Verlin Lonni BIRCH, MD  telmisartan (MICARDIS) 40 MG tablet Take 40 mg by mouth daily. 01/31/21   [provider]  tiZANidine  (ZANAFLEX ) 4 MG tablet Take 4 mg by mouth at bedtime. 11/30/23   [provider]  vitamin B-12 (CYANOCOBALAMIN) 500 MCG tablet Take 500 mcg by mouth daily.    [provider]    Allergies: Patient has no known allergies.    Review of Systems  Skin:  Positive  for rash.  All other systems reviewed and are negative.   Updated Vital Signs BP (!) 152/95 (BP Location: Left Arm)   Pulse 67   Temp 98.1 F (36.7 C) (Oral)   Resp 16   LMP 02/18/2014   SpO2 100%   Physical Exam Vitals and nursing note reviewed.   Gen: NAD Eyes: PERRL, EOMI HEENT: no oropharyngeal swelling Neck: trachea midline Resp: clear to auscultation bilaterally Card: RRR, no murmurs, rubs, or gallops Abd: nontender, nondistended Extremities: no calf tenderness, no edema, there is significant swelling noted to the right upper extremity, compartments are soft, the area over her right forearm is indurated with no fluctuance  noted normal range of motion of the right elbow actively and passively with no significant pain with passive range of motion Vascular: 2+ radial pulses bilaterally, 2+ DP pulses bilaterally Skin: no rashes Psyc: acting appropriately   (all labs ordered are listed, but only abnormal results are displayed) Labs Reviewed  COMPREHENSIVE METABOLIC PANEL WITH GFR - Abnormal; Notable for the following components:      Result Value   Sodium 134 (*)    Glucose, Bld 116 (*)    Total Protein 8.2 (*)    All other components within normal limits  CBC WITH DIFFERENTIAL/PLATELET - Abnormal; Notable for the following components:   RBC 3.39 (*)    Hemoglobin 10.6 (*)    HCT 33.9 (*)    Abs Immature Granulocytes 0.11 (*)    All other components within normal limits  SEDIMENTATION RATE - Abnormal; Notable for the following components:   Sed Rate 70 (*)    All other components within normal limits  CULTURE, BLOOD (ROUTINE X 2)  CULTURE, BLOOD (ROUTINE X 2)  URINALYSIS, W/ REFLEX TO CULTURE (INFECTION SUSPECTED)  C-REACTIVE PROTEIN  I-STAT CG4 LACTIC ACID, ED    EKG: None  Radiology: DG Forearm Right Result Date: 03/19/2024 CLINICAL DATA:  Swelling and redness. EXAM: RIGHT FOREARM - 2 VIEW COMPARISON:  None Available. FINDINGS: No fracture or dislocation. Preserved joint spaces and bone mineralization. Soft tissue swelling. No definite erosive changes. If there is further concern of bone or soft tissue infection, additional cross-sectional study could be considered as clinically appropriate for further sensitivity. IMPRESSION: Soft tissue swelling. Electronically Signed   By: Ranell Bring M.D.   On: 03/19/2024 16:04     Procedures   Medications Ordered in the ED  vancomycin  (VANCOCIN ) IVPB 1000 mg/200 mL premix (has no administration in time range)                                    Medical Decision Making 63 year old female with past medical history of hyperlipidemia and MRSA infection  in the past requiring hospitalization presenting to the emergency department today with right upper extremity swelling.  The patient's symptoms have persisted despite being on doxycycline  which should be adequate coverage for cellulitis.  She does not have any findings on exam consistent with septic arthritis at this time.  I will cover the patient with vancomycin  and obtained labs as well as blood cultures.  She will require admission.  The patient's labs are reassuring.  Her ESR is elevated.  She will be admitted to the hospitalist service for further evaluation.  Amount and/or Complexity of Data Reviewed Labs: ordered.  Risk Prescription drug management.        Final diagnoses:  Cellulitis, unspecified cellulitis site  ED Discharge Orders     None          Ula Prentice SAUNDERS, MD 03/19/24 2030

## 2024-03-19 NOTE — Progress Notes (Addendum)
 Pharmacy Antibiotic Note  Nicole Hines is a 63 y.o. female admitted on 03/19/2024 with RUE cellulitis + abscess s/p I&D on 7/28.  Pharmacy has been consulted for Vancomycin  dosing.  Plan: Give additional Vanc 1500mg  IV x1 now to complete loading dose, then Vancomycin  1750mg   IV q24h to target AUC 400-550.   Estimated AUC on this regimen = 501 Monitor renal function and cx data  Check levels as needed   Height: 5' 4 (162.6 cm) Weight: 123.2 kg (271 lb 9.7 oz) IBW/kg (Calculated) : 54.7  Temp (24hrs), Avg:98.4 F (36.9 C), Min:98 F (36.7 C), Max:98.9 F (37.2 C)  Recent Labs  Lab 03/19/24 1504 03/19/24 1538  WBC 8.9  --   CREATININE 0.77  --   LATICACIDVEN  --  1.0    Estimated Creatinine Clearance: 94.5 mL/min (by C-G formula based on SCr of 0.77 mg/dL).    No Known Allergies  Antimicrobials this admission: 7/30 Vancomycin  >>  7/30 Rocephin  >>   Dose adjustments this admission:  Microbiology results: 7/30 BCx:  7/30 UCx:    Thank you for allowing pharmacy to be a part of this patient's care.  Rosaline Millet PharmD 03/19/2024 11:59 PM

## 2024-03-20 ENCOUNTER — Observation Stay (HOSPITAL_COMMUNITY)

## 2024-03-20 DIAGNOSIS — Z8674 Personal history of sudden cardiac arrest: Secondary | ICD-10-CM | POA: Diagnosis not present

## 2024-03-20 DIAGNOSIS — L03119 Cellulitis of unspecified part of limb: Secondary | ICD-10-CM | POA: Diagnosis present

## 2024-03-20 DIAGNOSIS — I10 Essential (primary) hypertension: Secondary | ICD-10-CM | POA: Diagnosis present

## 2024-03-20 DIAGNOSIS — G8929 Other chronic pain: Secondary | ICD-10-CM | POA: Diagnosis present

## 2024-03-20 DIAGNOSIS — E785 Hyperlipidemia, unspecified: Secondary | ICD-10-CM | POA: Diagnosis present

## 2024-03-20 DIAGNOSIS — L03113 Cellulitis of right upper limb: Secondary | ICD-10-CM | POA: Diagnosis present

## 2024-03-20 DIAGNOSIS — F39 Unspecified mood [affective] disorder: Secondary | ICD-10-CM | POA: Diagnosis present

## 2024-03-20 DIAGNOSIS — F419 Anxiety disorder, unspecified: Secondary | ICD-10-CM | POA: Diagnosis present

## 2024-03-20 DIAGNOSIS — J45909 Unspecified asthma, uncomplicated: Secondary | ICD-10-CM | POA: Diagnosis present

## 2024-03-20 DIAGNOSIS — R609 Edema, unspecified: Secondary | ICD-10-CM | POA: Diagnosis not present

## 2024-03-20 DIAGNOSIS — Z22322 Carrier or suspected carrier of Methicillin resistant Staphylococcus aureus: Secondary | ICD-10-CM | POA: Diagnosis not present

## 2024-03-20 DIAGNOSIS — Z6841 Body Mass Index (BMI) 40.0 and over, adult: Secondary | ICD-10-CM | POA: Diagnosis not present

## 2024-03-20 DIAGNOSIS — B9562 Methicillin resistant Staphylococcus aureus infection as the cause of diseases classified elsewhere: Secondary | ICD-10-CM | POA: Diagnosis present

## 2024-03-20 DIAGNOSIS — L02413 Cutaneous abscess of right upper limb: Secondary | ICD-10-CM | POA: Diagnosis present

## 2024-03-20 DIAGNOSIS — Z79899 Other long term (current) drug therapy: Secondary | ICD-10-CM | POA: Diagnosis not present

## 2024-03-20 DIAGNOSIS — Z8249 Family history of ischemic heart disease and other diseases of the circulatory system: Secondary | ICD-10-CM | POA: Diagnosis not present

## 2024-03-20 DIAGNOSIS — E66813 Obesity, class 3: Secondary | ICD-10-CM | POA: Diagnosis present

## 2024-03-20 DIAGNOSIS — Z716 Tobacco abuse counseling: Secondary | ICD-10-CM | POA: Diagnosis not present

## 2024-03-20 DIAGNOSIS — Z9049 Acquired absence of other specified parts of digestive tract: Secondary | ICD-10-CM | POA: Diagnosis not present

## 2024-03-20 DIAGNOSIS — G629 Polyneuropathy, unspecified: Secondary | ICD-10-CM | POA: Diagnosis present

## 2024-03-20 DIAGNOSIS — Z833 Family history of diabetes mellitus: Secondary | ICD-10-CM | POA: Diagnosis not present

## 2024-03-20 LAB — CBC WITH DIFFERENTIAL/PLATELET
Abs Immature Granulocytes: 0.05 K/uL (ref 0.00–0.07)
Basophils Absolute: 0 K/uL (ref 0.0–0.1)
Basophils Relative: 0 %
Eosinophils Absolute: 0.1 K/uL (ref 0.0–0.5)
Eosinophils Relative: 1 %
HCT: 30.9 % — ABNORMAL LOW (ref 36.0–46.0)
Hemoglobin: 10 g/dL — ABNORMAL LOW (ref 12.0–15.0)
Immature Granulocytes: 1 %
Lymphocytes Relative: 21 %
Lymphs Abs: 2 K/uL (ref 0.7–4.0)
MCH: 32.2 pg (ref 26.0–34.0)
MCHC: 32.4 g/dL (ref 30.0–36.0)
MCV: 99.4 fL (ref 80.0–100.0)
Monocytes Absolute: 0.9 K/uL (ref 0.1–1.0)
Monocytes Relative: 9 %
Neutro Abs: 6.6 K/uL (ref 1.7–7.7)
Neutrophils Relative %: 68 %
Platelets: 190 K/uL (ref 150–400)
RBC: 3.11 MIL/uL — ABNORMAL LOW (ref 3.87–5.11)
RDW: 12.9 % (ref 11.5–15.5)
WBC: 9.7 K/uL (ref 4.0–10.5)
nRBC: 0 % (ref 0.0–0.2)

## 2024-03-20 LAB — BLOOD CULTURE ID PANEL (REFLEXED) - BCID2

## 2024-03-20 LAB — C-REACTIVE PROTEIN: CRP: 10.1 mg/dL — ABNORMAL HIGH (ref <1.0)

## 2024-03-20 LAB — BASIC METABOLIC PANEL WITH GFR
Anion gap: 11 (ref 5–15)
BUN: 8 mg/dL (ref 8–23)
CO2: 28 mmol/L (ref 22–32)
Calcium: 9.1 mg/dL (ref 8.9–10.3)
Chloride: 98 mmol/L (ref 98–111)
Creatinine, Ser: 0.4 mg/dL — ABNORMAL LOW (ref 0.44–1.00)
GFR, Estimated: >60 mL/min (ref >60)
Glucose, Bld: 108 mg/dL — ABNORMAL HIGH (ref 70–99)
Potassium: 3.9 mmol/L (ref 3.5–5.1)
Sodium: 137 mmol/L (ref 135–145)

## 2024-03-20 LAB — MAGNESIUM: Magnesium: 2.3 mg/dL (ref 1.7–2.4)

## 2024-03-20 LAB — SURGICAL PCR SCREEN
MRSA, PCR: POSITIVE — AB
Staphylococcus aureus: POSITIVE — AB

## 2024-03-20 LAB — PHOSPHORUS: Phosphorus: 2.3 mg/dL — ABNORMAL LOW (ref 2.5–4.6)

## 2024-03-20 LAB — HIV ANTIBODY (ROUTINE TESTING W REFLEX): HIV Screen 4th Generation wRfx: NONREACTIVE

## 2024-03-20 MED ORDER — SODIUM CHLORIDE 0.9% FLUSH
10.0000 mL | Freq: Two times a day (BID) | INTRAVENOUS | Status: DC
  Administered 2024-03-20 – 2024-03-22 (×4): 10 mL

## 2024-03-20 MED ORDER — VANCOMYCIN HCL 1750 MG/350ML IV SOLN
1750.0000 mg | INTRAVENOUS | Status: DC
  Administered 2024-03-20 – 2024-03-21 (×2): 1750 mg via INTRAVENOUS
  Filled 2024-03-20 (×2): qty 350

## 2024-03-20 MED ORDER — CHLORHEXIDINE GLUCONATE CLOTH 2 % EX PADS: 6.0000 | MEDICATED_PAD | Freq: Every day | CUTANEOUS | Status: DC

## 2024-03-20 NOTE — Plan of Care (Signed)

## 2024-03-20 NOTE — Progress Notes (Signed)
 PROGRESS NOTE    Nicole Hines  FMW:[id number] DOB: 1961/04/06 DOA: 03/19/2024 PCP: Onita Rush, MD   Brief Narrative:  Nicole Hines is a 63 y.o. 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  Reports that symptoms started approximately 1 week ago with an area of a scab on the dorsal aspect of her right forearm.  And spreading redness and swelling from that site.  Was seen by her primary care physician on Monday 7/28 and underwent an I&D of right dorsal forearm abscess.  Per patient no culture was taken.  She was started on doxycycline  100 mg twice daily which she is taking as prescribed.  However despite taking this the redness and swelling has worsened.  She does have pain with movement of the right elbow joint.  No measured fevers.  No chills.  Denies any other recent illness   Assessment & Plan:   Principal Problem:   Cellulitis of upper extremity  Cellulitis with abscess, RUE S/p I+D at PCP on 7/28 (no culture per patient)  X-ray with no osteomyelitis.  Patient started on Rocephin  and vancomycin , follow culture.  I personally remove the dressing, I was able to squeeze purulent material from the wound, I asked the nurse to squeeze little more.  Also placed orders for wound culture and advised the nurse to collect the sample through the swab.  Some improvement noted by the patient.  Continue current antibiotics.  Obtain ultrasound upper extremity to rule out DVT.  Asymptomatic bacteriuria/UTI ruled out Patient has no urinary complaints.  UA not impressive either.  UTI ruled out.   Severe AS undergoing evaluation for TAVR: Noted, outpatient cardiology f/u   hx of prior PEA arrest '23: Noted   HTN: Continue home telmisartan, blood pressure  controlled.  morbid obesity class III: Would benefit from weight loss outpatient.  Weight loss and diet modification counseled.  Hx MRSA bacteremia '22 c/b left SI septic arthritis / surrounding ostomyelitis: Followed by Dr. Fleeta Rothman now off antibiotics  Asthma: Asymptomatic.  Continue Singulair , albuterol .  ? Neuropathy / chronic pain: Continue gabapentin  at bedtime  Mood d/o: Continue Xanax  as needed, self telegram,  DVT prophylaxis: Lovenox    Code Status: Full Code  Family Communication:  None present at bedside.  Plan of care discussed with patient in length and he/she verbalized understanding and agreed with it.  Status is: Observation The patient will require care spanning > 2 midnights and should be moved to inpatient because: Still needs IV antibiotics.   Estimated body mass index is 46.62 kg/m as calculated from the following:   Height as of this encounter: 5' 4 (1.626 m).   Weight as of this encounter: 123.2 kg.    Nutritional Assessment: Body mass index is 46.62 kg/m.SABRA Seen by dietician.  I agree with the assessment and plan as outlined below: Nutrition Status:        . Skin Assessment: I have examined the patient's skin and I agree with the wound assessment as performed by the wound care RN as outlined below:    Consultants:  None  Procedures:  None  Antimicrobials:  Anti-infectives (From admission, onward)    Start     Dose/Rate Route Frequency Ordered Stop   03/20/24 2200  vancomycin  (VANCOREADY) IVPB 2000 mg/400 mL  Status:  Discontinued        2,000 mg 200 mL/hr over 120 Minutes Intravenous Every 24 hours 03/19/24 2327 03/20/24 0001   03/20/24 2200  vancomycin  (VANCOREADY) IVPB 1750 mg/350 mL        1,750 mg 175 mL/hr over 120 Minutes Intravenous Every 24 hours 03/20/24 0001     03/19/24 2330  vancomycin  (VANCOREADY) IVPB 1500 mg/300 mL        1,500 mg 150 mL/hr over 120 Minutes Intravenous NOW 03/19/24 2327 03/20/24 0245   03/19/24 2230   cefTRIAXone  (ROCEPHIN ) 1 g in sodium chloride  0.9 % 100 mL IVPB        1 g 200 mL/hr over 30 Minutes Intravenous Daily at bedtime 03/19/24 2216     03/19/24 2030  vancomycin  (VANCOCIN ) IVPB 1000 mg/200 mL premix        1,000 mg 200 mL/hr over 60 Minutes Intravenous  Once 03/19/24 2027 03/19/24 2330         Subjective: Patient seen and examined, still complains of the right arm pain, slightly better than yesterday.  No other complaint.  Objective: Vitals:   03/19/24 2130 03/19/24 2215 03/19/24 2340 03/20/24 0539  BP: 128/73  (!) 150/76 105/66  Pulse: 63  66 (!) 57  Resp: 16  18 17   Temp:  98 F (36.7 C) 98.4 F (36.9 C) 98.2 F (36.8 C)  TempSrc:   Oral Oral  SpO2: 95%  97% 94%  Weight:   123.2 kg   Height:   5' 4 (1.626 m)     Intake/Output Summary (Last 24 hours) at 03/20/2024 0816 Last data filed at 03/20/2024 0245 Gross per 24 hour  Intake 573.83 ml  Output --  Net 573.83 ml   Filed Weights   03/19/24 2340  Weight: 123.2 kg    Examination:  General exam: Appears calm and comfortable  Respiratory system: Clear to auscultation. Respiratory effort normal. Cardiovascular system: S1 & S2 heard, RRR. No JVD, murmurs, rubs, gallops or clicks. No pedal edema. Gastrointestinal system: Abdomen is nondistended, soft and nontender. No organomegaly or masses felt. Normal bowel sounds heard. Central nervous system: Alert and oriented. No focal neurological deficits. Extremities: Right arm erythema with wound with purulent material.  Edematous right arm. Psychiatry: Judgement and insight appear normal. Mood & affect appropriate.    Data Reviewed: I have personally reviewed following labs and imaging studies  CBC: Recent Labs  Lab 03/19/24 1504 03/20/24 0344  WBC 8.9 9.7  NEUTROABS 6.9 6.6  HGB 10.6* 10.0*  HCT 33.9* 30.9*  MCV 100.0 99.4  PLT 200 190   Basic Metabolic Panel: Recent Labs  Lab 03/19/24 1504 03/20/24 0344  NA 134* 137  K 3.5 3.9  CL 98 98   CO2 26 28  GLUCOSE 116* 108*  BUN 12 8  CREATININE 0.77 0.40*  CALCIUM  9.4 9.1  MG  --  2.3  PHOS  --  2.3*   GFR: Estimated Creatinine Clearance: 94.5 mL/min (A) (by C-G formula based on SCr of 0.4 mg/dL (L)). Liver Function Tests: Recent Labs  Lab 03/19/24 1504  AST 40  ALT 34  ALKPHOS 70  BILITOT 0.8  PROT 8.2*  ALBUMIN 3.6   No results for input(s): LIPASE, AMYLASE in the last 168 hours. No results for input(s): AMMONIA in the last 168 hours. Coagulation Profile: No results for input(s): INR, PROTIME in the last 168 hours. Cardiac Enzymes: No results for input(s): CKTOTAL, CKMB, CKMBINDEX, TROPONINI in the last 168 hours. BNP (last  3 results) No results for input(s): PROBNP in the last 8760 hours. HbA1C: No results for input(s): HGBA1C in the last 72 hours. CBG: No results for input(s): GLUCAP in the last 168 hours. Lipid Profile: No results for input(s): CHOL, HDL, LDLCALC, TRIG, CHOLHDL, LDLDIRECT in the last 72 hours. Thyroid Function Tests: No results for input(s): TSH, T4TOTAL, FREET4, T3FREE, THYROIDAB in the last 72 hours. Anemia Panel: No results for input(s): VITAMINB12, FOLATE, FERRITIN, TIBC, IRON, RETICCTPCT in the last 72 hours. Sepsis Labs: Recent Labs  Lab 03/19/24 1538  LATICACIDVEN 1.0    Recent Results (from the past 240 hours)  Blood culture (routine x 2)     Status: None (Preliminary result)   Collection Time: 03/19/24  4:02 PM   Specimen: BLOOD LEFT WRIST  Result Value Ref Range Status   Specimen Description   Final    BLOOD LEFT WRIST Performed at [facility], [address], [place], [place] [address]    Special Requests   Final    BOTTLES DRAWN AEROBIC ONLY Blood Culture results may not be optimal due to an inadequate volume of blood received in culture bottles Performed at [facility], [address], [place], [place] [address]    Culture  PENDING  Incomplete   Report Status PENDING  Incomplete     Radiology Studies: DG Forearm Right Result Date: 03/19/2024 CLINICAL DATA:  Swelling and redness. EXAM: RIGHT FOREARM - 2 VIEW COMPARISON:  None Available. FINDINGS: No fracture or dislocation. Preserved joint spaces and bone mineralization. Soft tissue swelling. No definite erosive changes. If there is further concern of bone or soft tissue infection, additional cross-sectional study could be considered as clinically appropriate for further sensitivity. IMPRESSION: Soft tissue swelling. Electronically Signed   By: Ranell Bring M.D.   On: 03/19/2024 16:04    Scheduled Meds:  citalopram   40 mg Oral q morning   enoxaparin  (LOVENOX ) injection  60 mg Subcutaneous Q24H   gabapentin   300 mg Oral QHS   irbesartan   150 mg Oral Daily   montelukast   10 mg Oral q morning   pantoprazole   40 mg Oral Daily   rosuvastatin   10 mg Oral Daily   Continuous Infusions:  cefTRIAXone  (ROCEPHIN )  IV Stopped (03/20/24 0020)   vancomycin        LOS: 0 days   Fredia Skeeter, MD [facility]  03/20/2024, 8:16 AM   *Please note that this is a verbal dictation therefore any spelling or grammatical errors are due to the Dragon Medical One system interpretation.  Please page via Amion and do not message via secure chat for urgent patient care matters. Secure chat can be used for non urgent patient care matters.  How to contact the [facility] Attending or Consulting provider 7A - 7P or covering provider during after hours 7P -7A, for this patient?  Check the care team in [facility] and look for a) attending/consulting [facility] provider listed and b) the [facility] team listed. Page or secure chat 7A-7P. Log into [email or web address] and use [facility]'s universal password to access. If you do not have the password, please contact the hospital operator. Locate the [facility] provider you are looking for under [facility] and page to a number that you can be directly reached. If  you still have difficulty reaching the provider, please page the [facility] (Director on Call) for the Hospitalists listed on amion for assistance.

## 2024-03-20 NOTE — TOC Initial Note (Signed)
 Transition of Care [facility]) - Initial/Assessment Note    Patient Details  Name: Nicole Hines MRN: [id number] Date of Birth: 1961/05/12  Transition of Care [facility]) CM/SW Contact:    Alfonse JONELLE Rex, RN Phone Number: 03/20/2024, 10:02 AM  Clinical Narrative:    Met with patient at bedside to introduce role of TOC/NCM and review for dc planning, pt confirmed she has an established PCP, no current home care services or home DME, reports she works full time, reports she feels safe returning home with support from friends and family. TOC will continue to follow.                  Barriers to Discharge: Continued Medical Work up   Patient Goals and CMS Choice Patient states their goals for this hospitalization and ongoing recovery are:: return home          Expected Discharge Plan and Services       Living arrangements for the past 2 months: Single Family Home                                      Prior Living Arrangements/Services Living arrangements for the past 2 months: Single Family Home Lives with:: Self Patient language and need for interpreter reviewed:: Yes        Need for Family Participation in Patient Care: Yes (Comment) Care giver support system in place?: Yes (comment)   Criminal Activity/Legal Involvement Pertinent to Current Situation/Hospitalization: No - Comment as needed  Activities of Daily Living   ADL Screening (condition at time of admission) Independently performs ADLs?: Yes (appropriate for developmental age) Is the patient deaf or have difficulty hearing?: No Does the patient have difficulty seeing, even when wearing glasses/contacts?: No Does the patient have difficulty concentrating, remembering, or making decisions?: No  Permission Sought/Granted                  Emotional Assessment Appearance:: Appears stated age Attitude/Demeanor/Rapport: Engaged Affect (typically observed): Accepting Orientation: : Oriented to Self, Oriented  to Place, Oriented to  Time, Oriented to Situation Alcohol / Substance Use: Not Applicable Psych Involvement: No (comment)  Admission diagnosis:  Cellulitis of upper extremity [L03.119] Cellulitis, unspecified cellulitis site [L03.90] Patient Active Problem List   Diagnosis Date Noted   Cellulitis of upper extremity 03/19/2024   Primary hypertension 05/22/2023   Pure hypercholesterolemia 05/22/2023   Finger infection 11/28/2022   MRSA bacteremia 05/29/2022   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   OSA (obstructive sleep apnea) 01/03/2022   Chronic osteomyelitis of sacrum (HCC) 11/14/2021   Severe aortic stenosis 08/28/2021   Acute respiratory failure with hypoxia (HCC)    Elevated troponin    Leukocytosis 08/26/2021   Prolonged QT interval 08/26/2021   Acute metabolic encephalopathy 08/26/2021   Sepsis (HCC) 08/26/2021   PEA (Pulseless electrical activity) (HCC) 08/26/2021   Syncope and collapse 08/26/2021   Syncope 08/25/2021   Other cirrhosis of liver (HCC) 08/25/2021   Asthma, chronic, unspecified asthma severity, with acute exacerbation    Opioid overdose (HCC)    Atrial fibrillation with RVR (HCC)    Neck pain on right side 05/16/2021   Sacral osteomyelitis (HCC) 03/04/2021   Myofascitis 03/04/2021   Phlegmon 01/07/2021   Chronic SI joint pain    Fever    Acute bacterial endocarditis    Left hip pain 12/19/2020   Septic joint (HCC) 12/19/2020  SIRS (systemic inflammatory response syndrome) (HCC) 12/18/2020   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 [place] [address] Phone: [phone] Fax: [phone]     Social Drivers of Health (SDOH) Social History: SDOH Screenings   Food Insecurity: No  Food Insecurity (03/19/2024)  Housing: High Risk (03/19/2024)  Transportation Needs: No Transportation Needs (03/19/2024)  Utilities: Not At Risk (03/19/2024)  Depression (PHQ2-9): Low Risk  (11/28/2022)  Tobacco Use: Low Risk  (03/19/2024)   SDOH Interventions:     Readmission Risk Interventions     No data to display

## 2024-03-20 NOTE — Consult Note (Addendum)
 WOC Nurse Consult Note: Reason for Consult: Requested to assess a wound on right arm. Wound type: abscess, cellulitis. Pressure Injury POA: NA Measurement: opening 0.2 cm round. Wound bed: not able to see. Drainage (amount, consistency, odor) Purulent, moderate amount. Periwound: redness and swelling, I marked the area to be able to control the infection spreading. Dressing procedure/placement/frequency: Cleanse with warm water, the warm pad can help the drain to flow, before squeeze the area. Cover the wound with gauze to collect the exudate. Repeat the draining process squeezing the arm at least once a day.  Note: the swab exam was collected with the drained exudate.  WOC team will not plan to follow further. Please reconsult if further assistance is needed. Thank-you,  Lela Holm BSN, CNS, RN, [employer], WOCN  (Phone [phone])

## 2024-03-20 NOTE — Progress Notes (Signed)
 RUE venous duplex has been completed.   Results can be found under chart review under CV PROC. 03/20/2024 2:51 PM Marlissa Emerick RVT, RDMS

## 2024-03-20 NOTE — Plan of Care (Signed)

## 2024-03-20 NOTE — Progress Notes (Signed)

## 2024-03-20 NOTE — Progress Notes (Signed)
 Date and time results received: 03/20/24 1545  Test: MRSA, PCR Critical Value: POSITIVE  Name of Provider Notified: Dr. Vernon  Orders Received? Or Actions Taken?: No new orders received.

## 2024-03-20 NOTE — Progress Notes (Signed)
 PHARMACY - PHYSICIAN COMMUNICATION CRITICAL VALUE ALERT - BLOOD CULTURE IDENTIFICATION (BCID)  Nicole Hines is an 63 y.o. female who presented to [facility] on 03/19/2024 with cellulitis with abscess, s/p I&D on 7/28  Assessment: BCID + Staph epidermidis (methicillin resistant) in 1/3 bottles  Name of physician (or Provider) Contacted: Lavanda Horns, FNP  Current antibiotics: Vancomycin  and Ceftriaxone   Changes to prescribed antibiotics recommended:  Patient is on recommended antibiotics - No changes needed  Results for orders placed or performed during the hospital encounter of 03/19/24  Blood Culture ID Panel (Reflexed) (Collected: 03/19/2024 10:00 PM)  Result Value Ref Range   Enterococcus faecalis NOT DETECTED NOT DETECTED   Enterococcus Faecium NOT DETECTED NOT DETECTED   Listeria monocytogenes NOT DETECTED NOT DETECTED   Staphylococcus species DETECTED (A) NOT DETECTED   Staphylococcus aureus (BCID) NOT DETECTED NOT DETECTED   Staphylococcus epidermidis DETECTED (A) NOT DETECTED   Staphylococcus lugdunensis NOT DETECTED NOT DETECTED   Streptococcus species NOT DETECTED NOT DETECTED   Streptococcus agalactiae NOT DETECTED NOT DETECTED   Streptococcus pneumoniae NOT DETECTED NOT DETECTED   Streptococcus pyogenes NOT DETECTED NOT DETECTED   A.calcoaceticus-baumannii NOT DETECTED NOT DETECTED   Bacteroides fragilis NOT DETECTED NOT DETECTED   Enterobacterales NOT DETECTED NOT DETECTED   Enterobacter cloacae complex NOT DETECTED NOT DETECTED   Escherichia coli NOT DETECTED NOT DETECTED   Klebsiella aerogenes NOT DETECTED NOT DETECTED   Klebsiella oxytoca NOT DETECTED NOT DETECTED   Klebsiella pneumoniae NOT DETECTED NOT DETECTED   Proteus species NOT DETECTED NOT DETECTED   Salmonella species NOT DETECTED NOT DETECTED   Serratia marcescens NOT DETECTED NOT DETECTED   Haemophilus influenzae NOT DETECTED NOT DETECTED   Neisseria meningitidis NOT DETECTED NOT DETECTED    Pseudomonas aeruginosa NOT DETECTED NOT DETECTED   Stenotrophomonas maltophilia NOT DETECTED NOT DETECTED   Candida albicans NOT DETECTED NOT DETECTED   Candida auris NOT DETECTED NOT DETECTED   Candida glabrata NOT DETECTED NOT DETECTED   Candida krusei NOT DETECTED NOT DETECTED   Candida parapsilosis NOT DETECTED NOT DETECTED   Candida tropicalis NOT DETECTED NOT DETECTED   Cryptococcus neoformans/gattii NOT DETECTED NOT DETECTED   Methicillin resistance mecA/C DETECTED (A) NOT DETECTED    Kayleigh Broadwell, Arvin Fletcher, PharmD 03/20/2024  11:29 PM

## 2024-03-21 DIAGNOSIS — L03113 Cellulitis of right upper limb: Secondary | ICD-10-CM | POA: Diagnosis not present

## 2024-03-21 LAB — CBC WITH DIFFERENTIAL/PLATELET
Abs Immature Granulocytes: 0.02 K/uL (ref 0.00–0.07)
Basophils Absolute: 0 K/uL (ref 0.0–0.1)
Basophils Relative: 0 %
Eosinophils Absolute: 0.2 K/uL (ref 0.0–0.5)
Eosinophils Relative: 3 %
HCT: 30.7 % — ABNORMAL LOW (ref 36.0–46.0)
Hemoglobin: 9.7 g/dL — ABNORMAL LOW (ref 12.0–15.0)
Immature Granulocytes: 0 %
Lymphocytes Relative: 23 %
Lymphs Abs: 1.7 K/uL (ref 0.7–4.0)
MCH: 31.8 pg (ref 26.0–34.0)
MCHC: 31.6 g/dL (ref 30.0–36.0)
MCV: 100.7 fL — ABNORMAL HIGH (ref 80.0–100.0)
Monocytes Absolute: 0.7 K/uL (ref 0.1–1.0)
Monocytes Relative: 9 %
Neutro Abs: 4.7 K/uL (ref 1.7–7.7)
Neutrophils Relative %: 65 %
Platelets: 203 K/uL (ref 150–400)
RBC: 3.05 MIL/uL — ABNORMAL LOW (ref 3.87–5.11)
RDW: 12.9 % (ref 11.5–15.5)
WBC: 7.3 K/uL (ref 4.0–10.5)
nRBC: 0 % (ref 0.0–0.2)

## 2024-03-21 LAB — BASIC METABOLIC PANEL WITH GFR
Anion gap: 7 (ref 5–15)
BUN: 14 mg/dL (ref 8–23)
CO2: 26 mmol/L (ref 22–32)
Calcium: 9 mg/dL (ref 8.9–10.3)
Chloride: 101 mmol/L (ref 98–111)
Creatinine, Ser: 0.68 mg/dL (ref 0.44–1.00)
GFR, Estimated: >60 mL/min (ref >60)
Glucose, Bld: 132 mg/dL — ABNORMAL HIGH (ref 70–99)
Potassium: 3.6 mmol/L (ref 3.5–5.1)
Sodium: 134 mmol/L — ABNORMAL LOW (ref 135–145)

## 2024-03-21 LAB — URINE CULTURE: Culture: NO GROWTH

## 2024-03-21 MED ORDER — DIPHENHYDRAMINE HCL 25 MG PO CAPS
25.0000 mg | ORAL_CAPSULE | Freq: Two times a day (BID) | ORAL | Status: DC
  Administered 2024-03-21 – 2024-03-22 (×2): 25 mg via ORAL
  Filled 2024-03-21 (×2): qty 1

## 2024-03-21 NOTE — Plan of Care (Signed)

## 2024-03-21 NOTE — Progress Notes (Signed)
 PROGRESS NOTE    Nicole Hines  FMW:[id number] DOB: [birth year] DOA: 03/19/2024 PCP: Onita Rush, MD   Brief Narrative:  Nicole Hines is a 63 y.o. 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  Reports that symptoms started approximately 1 week ago with an area of a scab on the dorsal aspect of her right forearm.  And spreading redness and swelling from that site.  Was seen by her primary care physician on Monday 7/28 and underwent an I&D of right dorsal forearm abscess.  Per patient no culture was taken.  She was started on doxycycline  100 mg twice daily which she is taking as prescribed.  However despite taking this the redness and swelling has worsened.  She does have pain with movement of the right elbow joint.  No measured fevers.  No chills.  Denies any other recent illness   Assessment & Plan:   Principal Problem:   Cellulitis of upper extremity Active Problems:   Cellulitis of right upper extremity  Cellulitis with abscess, RUE S/p I+D at PCP on 7/28 (no culture per patient)  X-ray with no osteomyelitis.  Patient started on Rocephin  and vancomycin , blood culture growing MRSE, wound culture is growing gram-positive cocci, awaiting final culture report.  DVT ruled out.  Will continue vancomycin  but discontinue Rocephin .   Asymptomatic bacteriuria/UTI ruled out Patient has no urinary complaints.  UA not impressive either.  UTI ruled out.   Severe AS undergoing evaluation for TAVR: Noted, outpatient cardiology f/u   hx of prior PEA arrest '23: Noted   HTN: Continue home telmisartan, blood pressure controlled.  morbid obesity class III: Would benefit from weight loss outpatient.  Weight loss and diet modification counseled.  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 Fleeta Rothman now off antibiotics.  Asthma: Asymptomatic.  Continue Singulair , albuterol .  ? Neuropathy / chronic pain: Continue gabapentin  at bedtime  Mood d/o: Continue Xanax  as needed, self telegram,  DVT prophylaxis: Lovenox    Code Status: Full Code  Family Communication:  None present at bedside.  Plan of care discussed with patient in length and he/she verbalized understanding and agreed with it.  Status is: Observation The patient will require care spanning > 2 midnights and should be moved to inpatient because: Still needs IV antibiotics.   Estimated body mass index is 46.62 kg/m as calculated from the following:   Height as of this encounter: 5' 4 (1.626 m).   Weight as of this encounter: 123.2 kg.    Nutritional Assessment: Body mass index is 46.62 kg/m.SABRA Seen by dietician.  I agree with the assessment and plan as outlined below: Nutrition Status:        . Skin Assessment: I have examined the patient's skin and I agree with the wound assessment as performed by the wound care RN as outlined below:    Consultants:  None  Procedures:  None  Antimicrobials:  Anti-infectives (From admission, onward)    Start     Dose/Rate Route Frequency Ordered Stop   03/20/24 2200  vancomycin  (VANCOREADY) IVPB 2000 mg/400 mL  Status:  Discontinued        2,000 mg 200 mL/hr over 120 Minutes Intravenous Every 24 hours 03/19/24 2327 03/20/24 0001   03/20/24 2200  vancomycin  (VANCOREADY) IVPB 1750 mg/350 mL        1,750 mg 175 mL/hr over 120 Minutes Intravenous Every 24 hours 03/20/24 0001     03/19/24 2330  vancomycin  (VANCOREADY) IVPB 1500 mg/300 mL        1,500 mg 150 mL/hr over 120 Minutes Intravenous NOW 03/19/24 2327 03/20/24 0245   03/19/24 2230  cefTRIAXone  (ROCEPHIN ) 1 g in sodium chloride  0.9 % 100 mL IVPB  Status:  Discontinued        1 g 200 mL/hr over 30 Minutes Intravenous Daily at bedtime  03/19/24 2216 03/21/24 1026   03/19/24 2030  vancomycin  (VANCOCIN ) IVPB 1000 mg/200 mL premix        1,000 mg 200 mL/hr over 60 Minutes Intravenous  Once 03/19/24 2027 03/19/24 2330         Subjective: Seen and examined.  Mild pain in the right upper extremity, improved compared to yesterday.  No other complaint.  Objective: Vitals:   03/20/24 0539 03/20/24 1329 03/20/24 2224 03/21/24 0617  BP: 105/66 101/66 (!) 131/95 114/73  Pulse: (!) 57 (!) 57 66 65  Resp: 17 16 16 16   Temp: 98.2 F (36.8 C) 97.8 F (36.6 C) 97.8 F (36.6 C) 97.9 F (36.6 C)  TempSrc: Oral Oral    SpO2: 94% 97% 99% 93%  Weight:      Height:        Intake/Output Summary (Last 24 hours) at 03/21/2024 1056 Last data filed at 03/21/2024 0600 Gross per 24 hour  Intake 1431.07 ml  Output --  Net 1431.07 ml   Filed Weights   03/19/24 2340  Weight: 123.2 kg    Examination:  General exam: Appears calm and comfortable  Respiratory system: Clear to auscultation. Respiratory effort normal. Cardiovascular system: S1 & S2 heard, RRR. No JVD, murmurs, rubs, gallops or clicks. No pedal edema. Gastrointestinal system: Abdomen is nondistended, soft and nontender. No organomegaly or masses felt. Normal bowel sounds heard. Central nervous system: Alert and oriented. No focal neurological deficits. Extremities: Dressing in the right upper extremity with some erythema, overall improved compared yesterday. Psychiatry: Judgement and insight appear normal. Mood & affect appropriate.   Data Reviewed: I have personally reviewed following labs and imaging studies  CBC: Recent Labs  Lab 03/19/24 1504 03/20/24 0344 03/21/24 0241  WBC 8.9 9.7 7.3  NEUTROABS 6.9 6.6 4.7  HGB 10.6* 10.0* 9.7*  HCT 33.9* 30.9* 30.7*  MCV 100.0 99.4 100.7*  PLT 200 190 203   Basic Metabolic Panel: Recent Labs  Lab 03/19/24 1504 03/20/24 0344 03/21/24 0241  NA 134* 137 134*  K 3.5 3.9 3.6  CL 98 98 101  CO2 26 28 26   GLUCOSE  116* 108* 132*  BUN 12 8 14   CREATININE 0.77 0.40* 0.68  CALCIUM  9.4 9.1 9.0  MG  --  2.3  --   PHOS  --  2.3*  --    GFR: Estimated Creatinine Clearance: 94.5 mL/min (by C-G formula based on SCr of 0.68 mg/dL). Liver Function Tests: Recent Labs  Lab 03/19/24 1504  AST 40  ALT 34  ALKPHOS 70  BILITOT 0.8  PROT 8.2*  ALBUMIN 3.6   No results for input(s): LIPASE, AMYLASE in the last 168 hours. No results for input(s): AMMONIA in the last 168 hours. Coagulation Profile: No results for input(s): INR, PROTIME in the last 168 hours. Cardiac Enzymes: No results for input(s): CKTOTAL, CKMB, CKMBINDEX, TROPONINI in the last 168 hours. BNP (last 3 results)  No results for input(s): PROBNP in the last 8760 hours. HbA1C: No results for input(s): HGBA1C in the last 72 hours. CBG: No results for input(s): GLUCAP in the last 168 hours. Lipid Profile: No results for input(s): CHOL, HDL, LDLCALC, TRIG, CHOLHDL, LDLDIRECT in the last 72 hours. Thyroid Function Tests: No results for input(s): TSH, T4TOTAL, FREET4, T3FREE, THYROIDAB in the last 72 hours. Anemia Panel: No results for input(s): VITAMINB12, FOLATE, FERRITIN, TIBC, IRON, RETICCTPCT in the last 72 hours. Sepsis Labs: Recent Labs  Lab 03/19/24 1538  LATICACIDVEN 1.0    Recent Results (from the past 240 hours)  Blood culture (routine x 2)     Status: None (Preliminary result)   Collection Time: 03/19/24  4:02 PM   Specimen: BLOOD LEFT WRIST  Result Value Ref Range Status   Specimen Description   Final    BLOOD LEFT WRIST Performed at [facility], [address], [place], [place] [address]    Special Requests   Final    BOTTLES DRAWN AEROBIC ONLY Blood Culture results may not be optimal due to an inadequate volume of blood received in culture bottles Performed at [facility], [address] [place], [place] [address]    Culture   Final     NO GROWTH 2 DAYS Performed at [facility], [address], [place], [place] [address]    Report Status PENDING  Incomplete  Urine Culture     Status: None   Collection Time: 03/19/24  8:26 PM   Specimen: Urine, Random  Result Value Ref Range Status   Specimen Description   Final    URINE, RANDOM Performed at [facility], [address], [place], [place] [address]    Special Requests   Final    NONE Reflexed from [id number] Performed at [facility], [address] [place], [place] [address]    Culture   Final    NO GROWTH Performed at [facility], [address], [place], [place] [address]    Report Status 03/21/2024 FINAL  Final  Blood culture (routine x 2)     Status: Abnormal (Preliminary result)   Collection Time: 03/19/24 10:00 PM   Specimen: BLOOD  Result Value Ref Range Status   Specimen Description   Final    BLOOD RIGHT ANTECUBITAL Performed at [facility], [address] [place], [place] [address]    Special Requests   Final    BOTTLES DRAWN AEROBIC AND ANAEROBIC Blood Culture results may not be optimal due to an inadequate volume of blood received in culture bottles Performed at [facility], [address], [place], [place] [address]    Culture  Setup Time   Final    GRAM POSITIVE COCCI IN CLUSTERS IN BOTH AEROBIC AND ANAEROBIC BOTTLES CRITICAL RESULT CALLED TO, READ BACK BY AND VERIFIED WITH: PHARMD LEANNE P [id number] FCP    Culture (A)  Final    STAPHYLOCOCCUS EPIDERMIDIS THE SIGNIFICANCE OF ISOLATING THIS ORGANISM FROM A SINGLE SET OF BLOOD CULTURES WHEN MULTIPLE SETS ARE DRAWN IS UNCERTAIN. PLEASE NOTIFY THE MICROBIOLOGY DEPARTMENT WITHIN ONE WEEK IF SPECIATION AND SENSITIVITIES ARE REQUIRED. Performed at [facility], [address], [place], [place] [address]    Report Status PENDING  Incomplete  Blood Culture ID Panel (Reflexed)     Status: Abnormal    Collection Time: 03/19/24 10:00 PM  Result Value Ref Range Status  Enterococcus faecalis NOT DETECTED NOT DETECTED Final   Enterococcus Faecium NOT DETECTED NOT DETECTED Final   Listeria monocytogenes NOT DETECTED NOT DETECTED Final   Staphylococcus species DETECTED (A) NOT DETECTED Final    Comment: CRITICAL RESULT CALLED TO, READ BACK BY AND VERIFIED WITH: PHARMD LEANNE P [phone] [id number] FCP    Staphylococcus aureus (BCID) NOT DETECTED NOT DETECTED Final   Staphylococcus epidermidis DETECTED (A) NOT DETECTED Final    Comment: Methicillin (oxacillin) resistant coagulase negative staphylococcus. Possible blood culture contaminant (unless isolated from more than one blood culture draw or clinical case suggests pathogenicity). No antibiotic treatment is indicated for blood  culture contaminants. CRITICAL RESULT CALLED TO, READ BACK BY AND VERIFIED WITH: PHARMD LEANNE P [phone] [id number] FCP    Staphylococcus lugdunensis NOT DETECTED NOT DETECTED Final   Streptococcus species NOT DETECTED NOT DETECTED Final   Streptococcus agalactiae NOT DETECTED NOT DETECTED Final   Streptococcus pneumoniae NOT DETECTED NOT DETECTED Final   Streptococcus pyogenes NOT DETECTED NOT DETECTED Final   A.calcoaceticus-baumannii NOT DETECTED NOT DETECTED Final   Bacteroides fragilis NOT DETECTED NOT DETECTED Final   Enterobacterales NOT DETECTED NOT DETECTED Final   Enterobacter cloacae complex NOT DETECTED NOT DETECTED Final   Escherichia coli NOT DETECTED NOT DETECTED Final   Klebsiella aerogenes NOT DETECTED NOT DETECTED Final   Klebsiella oxytoca NOT DETECTED NOT DETECTED Final   Klebsiella pneumoniae NOT DETECTED NOT DETECTED Final   Proteus species NOT DETECTED NOT DETECTED Final   Salmonella species NOT DETECTED NOT DETECTED Final   Serratia marcescens NOT DETECTED NOT DETECTED Final   Haemophilus influenzae NOT DETECTED NOT DETECTED Final   Neisseria meningitidis NOT DETECTED NOT DETECTED Final   Pseudomonas  aeruginosa NOT DETECTED NOT DETECTED Final   Stenotrophomonas maltophilia NOT DETECTED NOT DETECTED Final   Candida albicans NOT DETECTED NOT DETECTED Final   Candida auris NOT DETECTED NOT DETECTED Final   Candida glabrata NOT DETECTED NOT DETECTED Final   Candida krusei NOT DETECTED NOT DETECTED Final   Candida parapsilosis NOT DETECTED NOT DETECTED Final   Candida tropicalis NOT DETECTED NOT DETECTED Final   Cryptococcus neoformans/gattii NOT DETECTED NOT DETECTED Final   Methicillin resistance mecA/C DETECTED (A) NOT DETECTED Final    Comment: CRITICAL RESULT CALLED TO, READ BACK BY AND VERIFIED WITH: PHARMD LEANNE P [phone] [id number] FCP Performed at [facility], [address] [place], [place] [address]   Surgical pcr screen     Status: Abnormal   Collection Time: 03/20/24 10:46 AM   Specimen: Nasal Mucosa; Nasal Swab  Result Value Ref Range Status   MRSA, PCR POSITIVE (A) NEGATIVE Final    Comment: RESULT CALLED TO, READ BACK BY AND VERIFIED WITH: C.OKWAKOL, RN AT 1545 ON 03/20/24 BY N.THOMPSON    Staphylococcus aureus POSITIVE (A) NEGATIVE Final    Comment: (NOTE) The Xpert SA Assay (FDA approved for NASAL specimens in patients 32 years of age and older), is one component of a comprehensive surveillance program. It is not intended to diagnose infection nor to guide or monitor treatment. Performed at [facility], [address] [place], [place] [address]   Aerobic/Anaerobic Culture w Gram Stain (surgical/deep wound)     Status: None (Preliminary result)   Collection Time: 03/20/24  1:15 PM   Specimen: Wound  Result Value Ref Range Status   Specimen Description   Final    WOUND Performed at [facility], [address], [place], [place] [address]  Special Requests   Final    NONE Performed at [facility], [address] [place], [place] [address]    Gram Stain   Final    RARE WBC PRESENT, PREDOMINANTLY  PMN RARE GRAM POSITIVE COCCI    Culture   Final    TOO YOUNG TO READ Performed at [facility], [address] [place], [place] [address]    Report Status PENDING  Incomplete     Radiology Studies: VAS US  UPPER EXTREMITY VENOUS DUPLEX Result Date: 03/20/2024 UPPER VENOUS STUDY  Patient Name:  Nicole Hines  Date of Exam:   03/20/2024 Medical Rec #: [id number]         Accession #:    [id number] Date of Birth: [birth year]          Patient Gender: F Patient Age:   39 years Exam Location:  [facility] Procedure:      VAS US  UPPER EXTREMITY VENOUS DUPLEX Referring Phys: Ardyth Kelso --------------------------------------------------------------------------------  Indications: Edema Other Indications: Cellulitis of RUE. Comparison Study: No previous exams Performing Technologist: Jody Hill RVT, RDMS  Examination Guidelines: A complete evaluation includes B-mode imaging, spectral Doppler, color Doppler, and power Doppler as needed of all accessible portions of each vessel. Bilateral testing is considered an integral part of a complete examination. Limited examinations for reoccurring indications may be performed as noted.  Right Findings: +----------+------------+---------+-----------+----------+-------+ RIGHT     CompressiblePhasicitySpontaneousPropertiesSummary +----------+------------+---------+-----------+----------+-------+ IJV           Full       Yes       Yes                      +----------+------------+---------+-----------+----------+-------+ Subclavian    Full       Yes       Yes                      +----------+------------+---------+-----------+----------+-------+ Axillary      Full       Yes       Yes                      +----------+------------+---------+-----------+----------+-------+ Brachial      Full       Yes       Yes                      +----------+------------+---------+-----------+----------+-------+ Radial        Full                                           +----------+------------+---------+-----------+----------+-------+ Ulnar         Full                                          +----------+------------+---------+-----------+----------+-------+ Cephalic      Full                                          +----------+------------+---------+-----------+----------+-------+ Basilic       Full       Yes       Yes                      +----------+------------+---------+-----------+----------+-------+  Left Findings: +----------+------------+---------+-----------+----------+-------+ LEFT      CompressiblePhasicitySpontaneousPropertiesSummary +----------+------------+---------+-----------+----------+-------+ Subclavian    Full       Yes       Yes                      +----------+------------+---------+-----------+----------+-------+  Summary:  Right: No evidence of deep vein thrombosis in the upper extremity. No evidence of superficial vein thrombosis in the upper extremity. Subcutaneous edema in area od AC and forearm.  Left: No evidence of thrombosis in the subclavian.  *See table(s) above for measurements and observations.  Diagnosing physician: Lonni Gaskins MD Electronically signed by Lonni Gaskins MD on 03/20/2024 at 3:49:42 PM.    Final    DG Forearm Right Result Date: 03/19/2024 CLINICAL DATA:  Swelling and redness. EXAM: RIGHT FOREARM - 2 VIEW COMPARISON:  None Available. FINDINGS: No fracture or dislocation. Preserved joint spaces and bone mineralization. Soft tissue swelling. No definite erosive changes. If there is further concern of bone or soft tissue infection, additional cross-sectional study could be considered as clinically appropriate for further sensitivity. IMPRESSION: Soft tissue swelling. Electronically Signed   By: Ranell Bring M.D.   On: 03/19/2024 16:04    Scheduled Meds:  Chlorhexidine  Gluconate Cloth  6 each Topical Daily   citalopram   40 mg Oral q morning    enoxaparin  (LOVENOX ) injection  60 mg Subcutaneous Q24H   gabapentin   300 mg Oral QHS   irbesartan   150 mg Oral Daily   montelukast   10 mg Oral q morning   pantoprazole   40 mg Oral Daily   rosuvastatin   10 mg Oral Daily   sodium chloride  flush  10-40 mL Intracatheter Q12H   Continuous Infusions:  vancomycin  Stopped (03/21/24 1026)     LOS: 1 day   Fredia Skeeter, MD [facility]  03/21/2024, 10:56 AM   *Please note that this is a verbal dictation therefore any spelling or grammatical errors are due to the Dragon Medical One system interpretation.  Please page via Amion and do not message via secure chat for urgent patient care matters. Secure chat can be used for non urgent patient care matters.  How to contact the [facility] Attending or Consulting provider 7A - 7P or covering provider during after hours 7P -7A, for this patient?  Check the care team in [facility] and look for a) attending/consulting [facility] provider listed and b) the [facility] team listed. Page or secure chat 7A-7P. Log into [email or web address] and use 's universal password to access. If you do not have the password, please contact the [facility] operator. Locate the [facility] provider you are looking for under [facility] and page to a number that you can be directly reached. If you still have difficulty reaching the provider, please page the [facility] (Director on Call) for the Hospitalists listed on amion for assistance.

## 2024-03-21 NOTE — Progress Notes (Signed)
 Provided wound care per recommendations of WOC nurse:   Dressing procedure/placement/frequency: Cleanse with warm water, the warm pad can help the drain to flow, before squeeze the area. Cover the wound with gauze to collect the exudate. Repeat the draining process squeezing the arm at least once a day.

## 2024-03-21 NOTE — Plan of Care (Signed)
  Problem: Education: Goal: Knowledge of General Education information will improve Description: Including pain rating scale, medication(s)/side effects and non-pharmacologic comfort measures Outcome: Progressing   Problem: Health Behavior/Discharge Planning: Goal: Ability to manage health-related needs will improve Outcome: Progressing   Problem: Activity: Goal: Risk for activity intolerance will decrease Outcome: Progressing   Problem: Nutrition: Goal: Adequate nutrition will be maintained Outcome: Progressing   Problem: Coping: Goal: Level of anxiety will decrease Outcome: Progressing   Problem: Elimination: Goal: Will not experience complications related to urinary retention Outcome: Progressing   Problem: Pain Managment: Goal: General experience of comfort will improve and/or be controlled Outcome: Progressing

## 2024-03-22 ENCOUNTER — Other Ambulatory Visit (HOSPITAL_COMMUNITY): Payer: Self-pay

## 2024-03-22 DIAGNOSIS — L03113 Cellulitis of right upper limb: Secondary | ICD-10-CM | POA: Diagnosis not present

## 2024-03-22 LAB — CBC WITH DIFFERENTIAL/PLATELET
Abs Immature Granulocytes: 0.02 K/uL (ref 0.00–0.07)
Basophils Absolute: 0 K/uL (ref 0.0–0.1)
Basophils Relative: 1 %
Eosinophils Absolute: 0.1 K/uL (ref 0.0–0.5)
Eosinophils Relative: 3 %
HCT: 34.7 % — ABNORMAL LOW (ref 36.0–46.0)
Hemoglobin: 10.6 g/dL — ABNORMAL LOW (ref 12.0–15.0)
Immature Granulocytes: 0 %
Lymphocytes Relative: 24 %
Lymphs Abs: 1.3 K/uL (ref 0.7–4.0)
MCH: 30.9 pg (ref 26.0–34.0)
MCHC: 30.5 g/dL (ref 30.0–36.0)
MCV: 101.2 fL — ABNORMAL HIGH (ref 80.0–100.0)
Monocytes Absolute: 0.5 K/uL (ref 0.1–1.0)
Monocytes Relative: 8 %
Neutro Abs: 3.5 K/uL (ref 1.7–7.7)
Neutrophils Relative %: 64 %
Platelets: 233 K/uL (ref 150–400)
RBC: 3.43 MIL/uL — ABNORMAL LOW (ref 3.87–5.11)
RDW: 12.8 % (ref 11.5–15.5)
WBC: 5.5 K/uL (ref 4.0–10.5)
nRBC: 0 % (ref 0.0–0.2)

## 2024-03-22 LAB — CULTURE, BLOOD (ROUTINE X 2)

## 2024-03-22 LAB — BASIC METABOLIC PANEL WITH GFR
Anion gap: 11 (ref 5–15)
BUN: 12 mg/dL (ref 8–23)
CO2: 26 mmol/L (ref 22–32)
Calcium: 9.7 mg/dL (ref 8.9–10.3)
Chloride: 104 mmol/L (ref 98–111)
Creatinine, Ser: 0.65 mg/dL (ref 0.44–1.00)
GFR, Estimated: >60 mL/min (ref >60)
Glucose, Bld: 112 mg/dL — ABNORMAL HIGH (ref 70–99)
Potassium: 3.6 mmol/L (ref 3.5–5.1)
Sodium: 141 mmol/L (ref 135–145)

## 2024-03-22 MED ORDER — OXYCODONE HCL 5 MG PO TABS
5.0000 mg | ORAL_TABLET | Freq: Four times a day (QID) | ORAL | 0 refills | Status: DC | PRN
  Filled 2024-03-22: qty 30, 8d supply, fill #0

## 2024-03-22 MED ORDER — SULFAMETHOXAZOLE-TRIMETHOPRIM 800-160 MG PO TABS
1.0000 | ORAL_TABLET | Freq: Two times a day (BID) | ORAL | 0 refills | Status: AC
  Filled 2024-03-22: qty 14, 7d supply, fill #0

## 2024-03-22 NOTE — Discharge Summary (Addendum)
 Physician Discharge Summary  Nicole Hines FMW:[id number] DOB: [birth year] DOA: 03/19/2024  PCP: Onita Rush, MD  Admit date: 03/19/2024 Discharge date: 03/22/2024 30 Day Unplanned Readmission Risk Score    Flowsheet Row ED to Hosp-Admission (Current) from 03/19/2024 in [facility]  30 Day Unplanned Readmission Risk Score (%) 13.71 Filed at 03/22/2024 0800    This score is the patient's risk of an unplanned readmission within 30 days of being discharged (0 -100%). The score is based on dignosis, age, lab data, medications, orders, and past utilization.   Low:  0-14.9   Medium: 15-21.9   High: 22-29.9   Extreme: 30 and above          Admitted From: Home Disposition: Home  Recommendations for Outpatient Follow-up:  Follow up with PCP in 1-2 weeks Please obtain BMP/CBC in one week Please follow up with your PCP on the following pending results: Unresulted Labs (From admission, onward)     Start     Ordered   03/22/24 0819  Basic metabolic panel  Once,   R       Question:  Specimen collection method  Answer:  IV Team=IV Team collect   03/22/24 0818              Home Health: None Equipment/Devices: None  Discharge Condition: Stable CODE STATUS: Full code Diet recommendation: Cardiac  Subjective: Seen and examined, feels much better.  No complaints.  She is in agreement with going home.  Brief/Interim Summary: Nicole Hines is a 63 y.o. 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  Reports that symptoms started approximately 1 week ago with an area of a scab on the dorsal aspect of her right forearm.  And spreading redness and swelling from that site.  Was seen by her primary care physician on Monday 7/28 and underwent an  I&D of right dorsal forearm abscess.  Per patient no culture was taken.  She was started on doxycycline  100 mg twice daily, However despite taking this the redness and swelling worsened so she came to ED, no history of fever or chills.  Details below.   Cellulitis with abscess, RUE S/p I+D at PCP on 7/28 (no culture per patient)  X-ray with no osteomyelitis.  Patient started on Rocephin  and vancomycin , blood culture growing MRSE, wound culture is growing gram-positive cocci, final identification sensitivities still pending.  Patient has history of MRSA bacteremia and she is a MRSA carrier/nares MRSA positive as well.  Clinically she also appears to have MRSA cellulitis since she had significantly purulent discharge in the beginning.  Her cellulitis has improved significantly, no more drainage of the abscess.  Based on this, I am discharging this patient on 7 more days of Bactrim  DS to complete 10 days of MRSA coverage antibiotics.   Asymptomatic bacteriuria/UTI ruled out Patient has no urinary complaints.  UA not impressive either.  UTI ruled out.   Severe AS undergoing evaluation for TAVR: Noted, outpatient cardiology f/u    hx of prior PEA arrest '23: Noted    HTN: Continue home telmisartan, blood pressure controlled.   morbid obesity class III: Would benefit from weight loss outpatient.  Weight loss and diet modification counseled.   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 Fleeta Rothman now off antibiotics.   Asthma: Asymptomatic.  Continue Singulair , albuterol .   ? Neuropathy / chronic pain: Continue gabapentin  at bedtime   Mood d/o: Continue Xanax  as needed,   Morbid obesity class III: BMI 46.6.  Weight loss and diet modification counseled.  Discharge plan was discussed with patient and/or family member and they verbalized understanding and agreed with it.  Discharge Diagnoses:  Principal Problem:   Cellulitis of upper  extremity Active Problems:   MRSA (methicillin resistant Staphylococcus aureus) carrier   Morbid obesity (HCC)   Cellulitis of right upper extremity    Discharge Instructions   Allergies as of 03/22/2024   No Known Allergies      Medication List     STOP taking these medications    doxycycline  100 MG capsule Commonly known as: VIBRAMYCIN        TAKE these medications    albuterol  108 (90 Base) MCG/ACT inhaler Commonly known as: VENTOLIN  HFA Inhale 2 puffs into the lungs every 4 (four) hours as needed for shortness of breath or wheezing.   ALPRAZolam  0.5 MG tablet Commonly known as: XANAX  Take 1 mg by mouth at bedtime. For anxiety   ascorbic acid 500 MG tablet Commonly known as: VITAMIN C Take 500 mg by mouth daily.   citalopram  40 MG tablet Commonly known as: CELEXA  Take 40 mg by mouth every morning.   clobetasol cream 0.05 % Commonly known as: TEMOVATE Apply 1 application  topically as needed. Around lips   [employer] Magnesium 500 MG Tabs Generic drug: Magnesium Oxide -Mg Supplement Take 500 mg by mouth at bedtime.   fish oil-omega-3 fatty acids 1000 MG capsule Take 1 g by mouth 2 (two) times daily.   gabapentin  100 MG capsule Commonly known as: NEURONTIN  Take 300 mg by mouth at bedtime.   montelukast  10 MG tablet Commonly known as: SINGULAIR  Take 10 mg by mouth every morning.   multivitamin with minerals Tabs tablet Take 1 tablet by mouth daily.   omeprazole  20 MG capsule Commonly known as: PRILOSEC Take 1 capsule (20 mg total) by mouth daily before breakfast.   oxyCODONE -acetaminophen  10-325 MG tablet Commonly known as: PERCOCET  Take 1 tablet by mouth every 6 (six) hours as needed for pain.   polyethylene glycol 17 g packet Commonly known as: MIRALAX  / GLYCOLAX  Take 17 g by mouth daily as needed for severe constipation.   rosuvastatin  10 MG tablet Commonly known as: CRESTOR  Take 1 tablet (10 mg total) by mouth daily.   SLEEP PO Take 1  tablet by mouth at bedtime.   sulfamethoxazole -trimethoprim  800-160 MG tablet Commonly known as: Bactrim  DS Take 1 tablet by mouth 2 (two) times daily for 7 days.   telmisartan 40 MG tablet Commonly known as: MICARDIS Take 40 mg by mouth daily.   tiZANidine  4 MG tablet Commonly known as: ZANAFLEX  Take 4 mg by mouth at bedtime.   vitamin B-12 500 MCG tablet Commonly known as: CYANOCOBALAMIN Take 500 mcg by mouth daily.        Follow-up Information     Onita Rush, MD Follow up in 1 week(s).   Specialty: Internal Medicine Contact information: [address] [place] [address] [phone]                No Known Allergies  Consultations: None   Procedures/Studies: VAS US  UPPER EXTREMITY VENOUS DUPLEX Result Date: 03/20/2024 UPPER VENOUS STUDY  Patient Name:  HENSLEY AZIZ [facility]  Date of Exam:   03/20/2024  Medical Rec #: [id number]         Accession #:    [id number] Date of Birth: 1960/12/27          Patient Gender: F Patient Age:   63 years Exam Location:  [facility] Procedure:      VAS US  UPPER EXTREMITY VENOUS DUPLEX Referring Phys: Brian Zeitlin --------------------------------------------------------------------------------  Indications: Edema Other Indications: Cellulitis of RUE. Comparison Study: No previous exams Performing Technologist: Jody Hill RVT, RDMS  Examination Guidelines: A complete evaluation includes B-mode imaging, spectral Doppler, color Doppler, and power Doppler as needed of all accessible portions of each vessel. Bilateral testing is considered an integral part of a complete examination. Limited examinations for reoccurring indications may be performed as noted.  Right Findings: +----------+------------+---------+-----------+----------+-------+ RIGHT     CompressiblePhasicitySpontaneousPropertiesSummary +----------+------------+---------+-----------+----------+-------+ IJV           Full       Yes       Yes                       +----------+------------+---------+-----------+----------+-------+ Subclavian    Full       Yes       Yes                      +----------+------------+---------+-----------+----------+-------+ Axillary      Full       Yes       Yes                      +----------+------------+---------+-----------+----------+-------+ Brachial      Full       Yes       Yes                      +----------+------------+---------+-----------+----------+-------+ Radial        Full                                          +----------+------------+---------+-----------+----------+-------+ Ulnar         Full                                          +----------+------------+---------+-----------+----------+-------+ Cephalic      Full                                          +----------+------------+---------+-----------+----------+-------+ Basilic       Full       Yes       Yes                      +----------+------------+---------+-----------+----------+-------+  Left Findings: +----------+------------+---------+-----------+----------+-------+ LEFT      CompressiblePhasicitySpontaneousPropertiesSummary +----------+------------+---------+-----------+----------+-------+ Subclavian    Full       Yes       Yes                      +----------+------------+---------+-----------+----------+-------+  Summary:  Right: No evidence of deep vein thrombosis in the upper extremity. No evidence of superficial vein thrombosis in the upper extremity. Subcutaneous edema in area od AC and forearm.  Left: No evidence  of thrombosis in the subclavian.  *See table(s) above for measurements and observations.  Diagnosing physician: Lonni Gaskins MD Electronically signed by Lonni Gaskins MD on 03/20/2024 at 3:49:42 PM.    Final    DG Forearm Right Result Date: 03/19/2024 CLINICAL DATA:  Swelling and redness. EXAM: RIGHT FOREARM - 2 VIEW COMPARISON:  None Available. FINDINGS: No fracture or  dislocation. Preserved joint spaces and bone mineralization. Soft tissue swelling. No definite erosive changes. If there is further concern of bone or soft tissue infection, additional cross-sectional study could be considered as clinically appropriate for further sensitivity. IMPRESSION: Soft tissue swelling. Electronically Signed   By: Ranell Bring M.D.   On: 03/19/2024 16:04   CT CORONARY MORPH W/CTA COR W/SCORE W/CA W/CM &/OR WO/CM Addendum Date: 02/21/2024 ADDENDUM REPORT: 02/21/2024 17:47 ADDENDUM: OVER-READ INTERPRETATION  CT CHEST The following report is an over-read performed by radiologist Dr. Toribio Faes of [facility]. This over-read does not include interpretation of cardiac or coronary anatomy or pathology; that interpretation by cardiologist is attached. No pleural effusion.  Small pericardial effusion. No mass or adenopathy in visualized mediastinum. Prominent left para-aortic and bilateral retrocrural soft tissue largely stable since scans back to 12/18/2020. No pneumothorax. Visualized lung fields clear. Vertebral endplate spurring at multiple levels in the lower thoracic spine. IMPRESSION: 1. Small pericardial effusion. 2. Stable prominent left para-aortic and bilateral retrocrural soft tissue, possibly lymphatic but nonspecific. Electronically Signed   By: JONETTA Faes M.D.   On: 02/21/2024 17:47   Result Date: 02/21/2024 CLINICAL DATA:  Severe Aortic Stenosis Limited study in follow up to 02/14/24 imaging. EXAM: Cardiac TAVR CT TECHNIQUE: A non-contrast, gated CT scan was obtained with axial slices of 2.5 mm through the heart for aortic valve scoring. A 120 kV retrospective, gated, contrast cardiac scan was obtained. Gantry rotation speed was 230 msec and collimation was 0.63 mm. Nitroglycerin was not given. A delayed scan was obtained to exclude left atrial appendage thrombus. The 3D dataset was reconstructed in systole with motion correction. The 3D data set was reconstructed  in 5% intervals of the 0-95% of the R-R cycle. Systolic and diastolic phases were analyzed on a dedicated workstation using MPR, MIP, and VRT modes. The patient received 100 cc of contrast. FINDINGS: Aortic Valve: Valve Morphology: Tricuspid symmetric calcification with heavy calcification and reduced excursion the planimeter valve area is 0.983 Sq cm consistent with severe aortic stenosis Annular and subannular calcification: Single protruding calcification, severe due to the extension into the LVOT. Aortic Valve Calcium  Score: 2256 Presence of basal septal hypertrophy: Severe, 16 mm; systolic annulus is larger than diastolic and will be reported. Perimembranous septal diameter: 4 mm Mitral Valve: Heavy calcification of the intervalvular fibrosa. Aortic Annulus Measurements-30% Phase Major annulus diameter: 30 mm Minor annulus diameter: 23 mm Annular perimeter: 80 mm Annular area: 4.69 cm2 Aortic Measurements- 70% phase Sinotubular junction: 28 mm Ascending Thoracic Aorta: 34 mm Descending Thoracic Aorta: 27 mm Sinus of Valsalva Measurements: Right coronary cusp width: 28 mm Left coronary cusp width: 32 mm Non coronary cusp width: 31 mm Coronary Artery Height above Annulus: Left Main: 14 mm Left SoV height: 19 mm Right Coronary: 13 mm Right SoV height: 19 mm Optimum Fluoroscopic Angle for Delivery: LAO 14, CAU 13 Valves for structural team consideration: 26 mm Sapien 29 mm Evolut; reasonable based on average sinus diameter No significant changes from 02/14/24 study. IMPRESSION: 1. Severe Aortic stenosis. Measurements for potential interventions as above. Stanly Leavens MD Electronically Signed: By:  Stanly Leavens M.D. On: 02/21/2024 17:30     Discharge Exam: Vitals:   03/21/24 2226 03/22/24 0654  BP: (!) 151/64 (!) 151/65  Pulse: (!) 51 (!) 58  Resp: 14 17  Temp: 98 F (36.7 C) 98.3 F (36.8 C)  SpO2: 97% 97%   Vitals:   03/21/24 0617 03/21/24 1342 03/21/24 2226 03/22/24 0654  BP:  114/73 (!) 102/54 (!) 151/64 (!) 151/65  Pulse: 65 (!) 52 (!) 51 (!) 58  Resp: 16 17 14 17   Temp: 97.9 F (36.6 C) (!) 97.5 F (36.4 C) 98 F (36.7 C) 98.3 F (36.8 C)  TempSrc:   Oral Oral  SpO2: 93% 97% 97% 97%  Weight:      Height:        General: Pt is alert, awake, not in acute distress, obese Cardiovascular: RRR, S1/S2 +, no rubs, no gallops Respiratory: CTA bilaterally, no wheezing, no rhonchi Abdominal: Soft, NT, ND, bowel sounds + Extremities: no edema, no cyanosis, very minimal erythema at right arm, no tenderness.    The results of significant diagnostics from this hospitalization (including imaging, microbiology, ancillary and laboratory) are listed below for reference.     Microbiology: Recent Results (from the past 240 hours)  Blood culture (routine x 2)     Status: None (Preliminary result)   Collection Time: 03/19/24  4:02 PM   Specimen: BLOOD LEFT WRIST  Result Value Ref Range Status   Specimen Description   Final    BLOOD LEFT WRIST Performed at [facility], [address], [place], [place] [address]    Special Requests   Final    BOTTLES DRAWN AEROBIC ONLY Blood Culture results may not be optimal due to an inadequate volume of blood received in culture bottles Performed at [facility], [address] [place], [place] [address]    Culture   Final    NO GROWTH 3 DAYS Performed at [facility], [address], [place], [place] [address]    Report Status PENDING  Incomplete  Urine Culture     Status: None   Collection Time: 03/19/24  8:26 PM   Specimen: Urine, Random  Result Value Ref Range Status   Specimen Description   Final    URINE, RANDOM Performed at [facility], [address], [place], [place] [address]    Special Requests   Final    NONE Reflexed from [id number] Performed at [facility], [address], [place], [place] [address]    Culture   Final    NO  GROWTH Performed at [facility], [address] [place], [place] [address]    Report Status 03/21/2024 FINAL  Final  Blood culture (routine x 2)     Status: Abnormal (Preliminary result)   Collection Time: 03/19/24 10:00 PM   Specimen: BLOOD  Result Value Ref Range Status   Specimen Description   Final    BLOOD RIGHT ANTECUBITAL Performed at [facility], [address], [place], [place] [address]    Special Requests   Final    BOTTLES DRAWN AEROBIC AND ANAEROBIC Blood Culture results may not be optimal due to an inadequate volume of blood received in culture bottles Performed at [facility], [address] [place], [place] [address]    Culture  Setup Time   Final    GRAM POSITIVE COCCI IN CLUSTERS IN BOTH AEROBIC AND ANAEROBIC BOTTLES CRITICAL  RESULT CALLED TO, READ BACK BY AND VERIFIED WITH: PHARMD LEANNE P [id number] [id number] FCP    Culture (A)  Final    STAPHYLOCOCCUS EPIDERMIDIS THE SIGNIFICANCE OF ISOLATING THIS ORGANISM FROM A SINGLE SET OF BLOOD CULTURES WHEN MULTIPLE SETS ARE DRAWN IS UNCERTAIN. PLEASE NOTIFY THE MICROBIOLOGY DEPARTMENT WITHIN ONE WEEK IF SPECIATION AND SENSITIVITIES ARE REQUIRED. Performed at [facility], [address] [place], [place] [address]    Report Status PENDING  Incomplete  Blood Culture ID Panel (Reflexed)     Status: Abnormal   Collection Time: 03/19/24 10:00 PM  Result Value Ref Range Status   Enterococcus faecalis NOT DETECTED NOT DETECTED Final   Enterococcus Faecium NOT DETECTED NOT DETECTED Final   Listeria monocytogenes NOT DETECTED NOT DETECTED Final   Staphylococcus species DETECTED (A) NOT DETECTED Final    Comment: CRITICAL RESULT CALLED TO, READ BACK BY AND VERIFIED WITH: PHARMD LEANNE P [phone] FCP    Staphylococcus aureus (BCID) NOT DETECTED NOT DETECTED Final   Staphylococcus epidermidis DETECTED (A) NOT DETECTED Final    Comment: Methicillin (oxacillin) resistant coagulase  negative staphylococcus. Possible blood culture contaminant (unless isolated from more than one blood culture draw or clinical case suggests pathogenicity). No antibiotic treatment is indicated for blood  culture contaminants. CRITICAL RESULT CALLED TO, READ BACK BY AND VERIFIED WITH: PHARMD LEANNE P [phone] [id number] FCP    Staphylococcus lugdunensis NOT DETECTED NOT DETECTED Final   Streptococcus species NOT DETECTED NOT DETECTED Final   Streptococcus agalactiae NOT DETECTED NOT DETECTED Final   Streptococcus pneumoniae NOT DETECTED NOT DETECTED Final   Streptococcus pyogenes NOT DETECTED NOT DETECTED Final   A.calcoaceticus-baumannii NOT DETECTED NOT DETECTED Final   Bacteroides fragilis NOT DETECTED NOT DETECTED Final   Enterobacterales NOT DETECTED NOT DETECTED Final   Enterobacter cloacae complex NOT DETECTED NOT DETECTED Final   Escherichia coli NOT DETECTED NOT DETECTED Final   Klebsiella aerogenes NOT DETECTED NOT DETECTED Final   Klebsiella oxytoca NOT DETECTED NOT DETECTED Final   Klebsiella pneumoniae NOT DETECTED NOT DETECTED Final   Proteus species NOT DETECTED NOT DETECTED Final   Salmonella species NOT DETECTED NOT DETECTED Final   Serratia marcescens NOT DETECTED NOT DETECTED Final   Haemophilus influenzae NOT DETECTED NOT DETECTED Final   Neisseria meningitidis NOT DETECTED NOT DETECTED Final   Pseudomonas aeruginosa NOT DETECTED NOT DETECTED Final   Stenotrophomonas maltophilia NOT DETECTED NOT DETECTED Final   Candida albicans NOT DETECTED NOT DETECTED Final   Candida auris NOT DETECTED NOT DETECTED Final   Candida glabrata NOT DETECTED NOT DETECTED Final   Candida krusei NOT DETECTED NOT DETECTED Final   Candida parapsilosis NOT DETECTED NOT DETECTED Final   Candida tropicalis NOT DETECTED NOT DETECTED Final   Cryptococcus neoformans/gattii NOT DETECTED NOT DETECTED Final   Methicillin resistance mecA/C DETECTED (A) NOT DETECTED Final    Comment: CRITICAL RESULT CALLED  TO, READ BACK BY AND VERIFIED WITH: PHARMD LEANNE P [phone] [id number] FCP Performed at [facility], [address], [place], [place] [address]   Surgical pcr screen     Status: Abnormal   Collection Time: 03/20/24 10:46 AM   Specimen: Nasal Mucosa; Nasal Swab  Result Value Ref Range Status   MRSA, PCR POSITIVE (A) NEGATIVE Final    Comment: RESULT CALLED TO, READ BACK BY AND VERIFIED WITH: C.OKWAKOL, RN AT 1545 ON 03/20/24 BY N.THOMPSON    Staphylococcus aureus POSITIVE (A) NEGATIVE Final    Comment: (NOTE) The  Xpert SA Assay (FDA approved for NASAL specimens in patients 15 years of age and older), is one component of a comprehensive surveillance program. It is not intended to diagnose infection nor to guide or monitor treatment. Performed at [facility], [address], [place], [place] [address]   Aerobic/Anaerobic Culture w Gram Stain (surgical/deep wound)     Status: None (Preliminary result)   Collection Time: 03/20/24  1:15 PM   Specimen: Wound  Result Value Ref Range Status   Specimen Description   Final    WOUND Performed at [facility], [address], [place], [place] [address]    Special Requests   Final    NONE Performed at [name], [address], [place], [place] [address]    Gram Stain   Final    RARE WBC PRESENT, PREDOMINANTLY PMN RARE GRAM POSITIVE COCCI    Culture   Final    TOO YOUNG TO READ Performed at [facility], [address], [place], [place] [address]    Report Status PENDING  Incomplete     Labs: BNP (last 3 results) No results for input(s): BNP in the last 8760 hours. Basic Metabolic Panel: Recent Labs  Lab 03/19/24 1504 03/20/24 0344 03/21/24 0241  NA 134* 137 134*  K 3.5 3.9 3.6  CL 98 98 101  CO2 26 28 26   GLUCOSE 116* 108* 132*  BUN 12 8 14   CREATININE 0.77 0.40* 0.68  CALCIUM  9.4 9.1 9.0  MG  --  2.3  --   PHOS  --  2.3*  --    Liver Function  Tests: Recent Labs  Lab 03/19/24 1504  AST 40  ALT 34  ALKPHOS 70  BILITOT 0.8  PROT 8.2*  ALBUMIN 3.6   No results for input(s): LIPASE, AMYLASE in the last 168 hours. No results for input(s): AMMONIA in the last 168 hours. CBC: Recent Labs  Lab 03/19/24 1504 03/20/24 0344 03/21/24 0241 03/22/24 0938  WBC 8.9 9.7 7.3 5.5  NEUTROABS 6.9 6.6 4.7 3.5  HGB 10.6* 10.0* 9.7* 10.6*  HCT 33.9* 30.9* 30.7* 34.7*  MCV 100.0 99.4 100.7* 101.2*  PLT 200 190 203 233   Cardiac Enzymes: No results for input(s): CKTOTAL, CKMB, CKMBINDEX, TROPONINI in the last 168 hours. BNP: Invalid input(s): POCBNP CBG: No results for input(s): GLUCAP in the last 168 hours. D-Dimer No results for input(s): DDIMER in the last 72 hours. Hgb A1c No results for input(s): HGBA1C in the last 72 hours. Lipid Profile No results for input(s): CHOL, HDL, LDLCALC, TRIG, CHOLHDL, LDLDIRECT in the last 72 hours. Thyroid function studies No results for input(s): TSH, T4TOTAL, T3FREE, THYROIDAB in the last 72 hours.  Invalid input(s): FREET3 Anemia work up No results for input(s): VITAMINB12, FOLATE, FERRITIN, TIBC, IRON, RETICCTPCT in the last 72 hours. Urinalysis    Component Value Date/Time   COLORURINE YELLOW 03/19/2024 2026   APPEARANCEUR CLEAR 03/19/2024 2026   LABSPEC 1.023 03/19/2024 2026   PHURINE 5.0 03/19/2024 2026   GLUCOSEU NEGATIVE 03/19/2024 2026   HGBUR NEGATIVE 03/19/2024 2026   BILIRUBINUR NEGATIVE 03/19/2024 2026   KETONESUR NEGATIVE 03/19/2024 2026   PROTEINUR 30 (A) 03/19/2024 2026   UROBILINOGEN 0.2 11/20/2013 1603   NITRITE NEGATIVE 03/19/2024 2026   LEUKOCYTESUR SMALL (A) 03/19/2024 2026   Sepsis Labs Recent Labs  Lab 03/19/24 1504 03/20/24 0344 03/21/24 0241 03/22/24 0938  WBC 8.9 9.7 7.3 5.5   Microbiology Recent Results (from  the past 240 hours)  Blood culture (routine x 2)     Status: None (Preliminary  result)   Collection Time: 03/19/24  4:02 PM   Specimen: BLOOD LEFT WRIST  Result Value Ref Range Status   Specimen Description   Final    BLOOD LEFT WRIST Performed at [facility], [address] [place], [place] [address]    Special Requests   Final    BOTTLES DRAWN AEROBIC ONLY Blood Culture results may not be optimal due to an inadequate volume of blood received in culture bottles Performed at [facility], [address] [place], [place] [address]    Culture   Final    NO GROWTH 3 DAYS Performed at [facility], [address], [place], [place] [address]    Report Status PENDING  Incomplete  Urine Culture     Status: None   Collection Time: 03/19/24  8:26 PM   Specimen: Urine, Random  Result Value Ref Range Status   Specimen Description   Final    URINE, RANDOM Performed at [facility], [address] [place], [place] [address]    Special Requests   Final    NONE Reflexed from [id number] Performed at [facility], [address], [place], [place] [address]    Culture   Final    NO GROWTH Performed at [facility], [address] [place], [place] [address]    Report Status 03/21/2024 FINAL  Final  Blood culture (routine x 2)     Status: Abnormal (Preliminary result)   Collection Time: 03/19/24 10:00 PM   Specimen: BLOOD  Result Value Ref Range Status   Specimen Description   Final    BLOOD RIGHT ANTECUBITAL Performed at [facility], [address] [place], [place] [address]    Special Requests   Final    BOTTLES DRAWN AEROBIC AND ANAEROBIC Blood Culture results may not be optimal due to an inadequate volume of blood received in culture bottles Performed at [facility], [address], [place], [place] [address]    Culture  Setup Time   Final    GRAM POSITIVE COCCI IN CLUSTERS IN BOTH AEROBIC AND ANAEROBIC BOTTLES CRITICAL RESULT CALLED TO, READ  BACK BY AND VERIFIED WITH: PHARMD LEANNE P [id number] FCP    Culture (A)  Final    STAPHYLOCOCCUS EPIDERMIDIS THE SIGNIFICANCE OF ISOLATING THIS ORGANISM FROM A SINGLE SET OF BLOOD CULTURES WHEN MULTIPLE SETS ARE DRAWN IS UNCERTAIN. PLEASE NOTIFY THE MICROBIOLOGY DEPARTMENT WITHIN ONE WEEK IF SPECIATION AND SENSITIVITIES ARE REQUIRED. Performed at [facility], [address], [place], [place] [address]    Report Status PENDING  Incomplete  Blood Culture ID Panel (Reflexed)     Status: Abnormal   Collection Time: 03/19/24 10:00 PM  Result Value Ref Range Status   Enterococcus faecalis NOT DETECTED NOT DETECTED Final   Enterococcus Faecium NOT DETECTED NOT DETECTED Final   Listeria monocytogenes NOT DETECTED NOT DETECTED Final   Staphylococcus species DETECTED (A) NOT DETECTED Final    Comment: CRITICAL RESULT CALLED TO, READ BACK BY AND VERIFIED WITH: PHARMD LEANNE P [id number] FCP    Staphylococcus aureus (BCID) NOT DETECTED NOT DETECTED Final   Staphylococcus epidermidis DETECTED (A) NOT DETECTED Final    Comment: Methicillin (oxacillin) resistant coagulase negative staphylococcus. Possible blood culture contaminant (unless isolated from more than one blood culture draw or clinical case  suggests pathogenicity). No antibiotic treatment is indicated for blood  culture contaminants. CRITICAL RESULT CALLED TO, READ BACK BY AND VERIFIED WITH: PHARMD LEANNE P [phone] [id number] FCP    Staphylococcus lugdunensis NOT DETECTED NOT DETECTED Final   Streptococcus species NOT DETECTED NOT DETECTED Final   Streptococcus agalactiae NOT DETECTED NOT DETECTED Final   Streptococcus pneumoniae NOT DETECTED NOT DETECTED Final   Streptococcus pyogenes NOT DETECTED NOT DETECTED Final   A.calcoaceticus-baumannii NOT DETECTED NOT DETECTED Final   Bacteroides fragilis NOT DETECTED NOT DETECTED Final   Enterobacterales NOT DETECTED NOT DETECTED Final   Enterobacter cloacae complex NOT DETECTED NOT  DETECTED Final   Escherichia coli NOT DETECTED NOT DETECTED Final   Klebsiella aerogenes NOT DETECTED NOT DETECTED Final   Klebsiella oxytoca NOT DETECTED NOT DETECTED Final   Klebsiella pneumoniae NOT DETECTED NOT DETECTED Final   Proteus species NOT DETECTED NOT DETECTED Final   Salmonella species NOT DETECTED NOT DETECTED Final   Serratia marcescens NOT DETECTED NOT DETECTED Final   Haemophilus influenzae NOT DETECTED NOT DETECTED Final   Neisseria meningitidis NOT DETECTED NOT DETECTED Final   Pseudomonas aeruginosa NOT DETECTED NOT DETECTED Final   Stenotrophomonas maltophilia NOT DETECTED NOT DETECTED Final   Candida albicans NOT DETECTED NOT DETECTED Final   Candida auris NOT DETECTED NOT DETECTED Final   Candida glabrata NOT DETECTED NOT DETECTED Final   Candida krusei NOT DETECTED NOT DETECTED Final   Candida parapsilosis NOT DETECTED NOT DETECTED Final   Candida tropicalis NOT DETECTED NOT DETECTED Final   Cryptococcus neoformans/gattii NOT DETECTED NOT DETECTED Final   Methicillin resistance mecA/C DETECTED (A) NOT DETECTED Final    Comment: CRITICAL RESULT CALLED TO, READ BACK BY AND VERIFIED WITH: PHARMD LEANNE P [phone] [id number] FCP Performed at [facility], [address], [place], [place] [address]   Surgical pcr screen     Status: Abnormal   Collection Time: 03/20/24 10:46 AM   Specimen: Nasal Mucosa; Nasal Swab  Result Value Ref Range Status   MRSA, PCR POSITIVE (A) NEGATIVE Final    Comment: RESULT CALLED TO, READ BACK BY AND VERIFIED WITH: C.OKWAKOL, RN AT 1545 ON 03/20/24 BY N.[email or web address]    Staphylococcus aureus POSITIVE (A) NEGATIVE Final    Comment: (NOTE) The Xpert SA Assay (FDA approved for NASAL specimens in patients 11 years of age and older), is one component of a comprehensive surveillance program. It is not intended to diagnose infection nor to guide or monitor treatment. Performed at [facility], [address] [place], [place] [address]   Aerobic/Anaerobic Culture w Gram Stain (surgical/deep wound)     Status: None (Preliminary result)   Collection Time: 03/20/24  1:15 PM   Specimen: Wound  Result Value Ref Range Status   Specimen Description   Final    WOUND Performed at [facility], [address] [place], [place] [address]    Special Requests   Final    NONE Performed at [facility], [address] [place], [place] [address]    Gram Stain   Final    RARE WBC PRESENT, PREDOMINANTLY PMN RARE GRAM POSITIVE COCCI    Culture   Final    TOO YOUNG TO READ Performed at [facility], [address], [place], [place] [address]    Report Status PENDING  Incomplete    FURTHER DISCHARGE INSTRUCTIONS:   Get Medicines reviewed and adjusted: Please take all your medications with you  for your next visit with your Primary MD   Laboratory/radiological data: Please request your Primary MD to go over all hospital tests and procedure/radiological results at the follow up, please ask your Primary MD to get all Hospital records sent to his/her office.   In some cases, they will be blood work, cultures and biopsy results pending at the time of your discharge. Please request that your primary care M.D. goes through all the records of your hospital data and follows up on these results.   Also Note the following: If you experience worsening of your admission symptoms, develop shortness of breath, life threatening emergency, suicidal or homicidal thoughts you must seek medical attention immediately by calling 911 or calling your MD immediately  if symptoms less severe.   You must read complete instructions/literature along with all the possible adverse reactions/side effects for all the Medicines you take and that have been prescribed to you. Take any new Medicines after you have completely understood and accpet all the possible adverse reactions/side effects.     patient was instructed, not to drive, operate heavy machinery, perform activities at heights, swimming or participation in water activities or provide baby-sitting services while on Pain, Sleep and Anxiety Medications; until their outpatient Physician has advised to do so again. Also recommended to not to take more than prescribed Pain, Sleep and Anxiety Medications.  It is not advisable to combine anxiety, sleep and pain medications without talking with your primary care provider.     Wear Seat belts while driving.   Please note: You were cared for by a hospitalist during your hospital stay. Once you are discharged, your primary care physician will handle any further medical issues. Please note that NO REFILLS for any discharge medications will be authorized once you are discharged, as it is imperative that you return to your primary care physician (or establish a relationship with a primary care physician if you do not have one) for your post hospital discharge needs so that they can reassess your need for medications and monitor your lab values  Time coordinating discharge: Over 30 minutes  SIGNED:   Fredia Skeeter, MD  [facility] 03/22/2024, 11:36 AM *Please note that this is a verbal dictation therefore any spelling or grammatical errors are due to the Dragon Medical One system interpretation. If 7PM-7AM, please contact night-coverage [email or web address]

## 2024-03-22 NOTE — Plan of Care (Signed)

## 2024-03-24 ENCOUNTER — Other Ambulatory Visit (HOSPITAL_COMMUNITY): Payer: Self-pay

## 2024-03-24 LAB — CULTURE, BLOOD (ROUTINE X 2): Culture: NO GROWTH

## 2024-03-25 LAB — AEROBIC/ANAEROBIC CULTURE W GRAM STAIN (SURGICAL/DEEP WOUND)

## 2024-03-27 ENCOUNTER — Encounter (HOSPITAL_COMMUNITY): Payer: Self-pay | Admitting: Radiology

## 2024-08-05 ENCOUNTER — Telehealth: Payer: Self-pay

## 2024-08-05 NOTE — Telephone Encounter (Signed)
 We have reached out to this patient a few times, waiting until she had had her dental evaluation before moving on with the TAVR work up.   When I called on 10/29, she stated that she has not made an appointment for a dental evaluation yet as she was getting an ultrasound of her thyroid (scheduled for 10/30). She wanted to take one thing at a time, but she said that she will call us  when she schedules her dental evaluation.   We have yet to hear from her, so I called her again today (12/16) and left a message asking her to call back to discuss if she has had a dental appointment and if she still wants to proceed with the TAVR work up.

## 2024-08-27 NOTE — Telephone Encounter (Signed)
 I left the patient another message today (08/27/24) trying to touch base and see if she has seen a dentist and if she would still like to proceed with TAVR work up.
